# Patient Record
Sex: Female | Born: 1955 | Race: White | Hispanic: No | Marital: Married | Smoking: Former smoker
Health system: Southern US, Community
[De-identification: ages and names within clinical notes are randomized; demographics above are authoritative.]

## PROBLEM LIST (undated history)

## (undated) DIAGNOSIS — E785 Hyperlipidemia, unspecified: Secondary | ICD-10-CM

## (undated) DIAGNOSIS — F419 Anxiety disorder, unspecified: Secondary | ICD-10-CM

## (undated) DIAGNOSIS — Z860100 Personal history of colon polyps, unspecified: Secondary | ICD-10-CM

## (undated) DIAGNOSIS — C9000 Multiple myeloma not having achieved remission: Secondary | ICD-10-CM

## (undated) DIAGNOSIS — E119 Type 2 diabetes mellitus without complications: Secondary | ICD-10-CM

## (undated) DIAGNOSIS — R000 Tachycardia, unspecified: Secondary | ICD-10-CM

## (undated) DIAGNOSIS — K219 Gastro-esophageal reflux disease without esophagitis: Secondary | ICD-10-CM

## (undated) HISTORY — PX: TONSILLECTOMY: SUR1361

## (undated) HISTORY — DX: Tachycardia, unspecified: R00.0

## (undated) HISTORY — DX: Personal history of colonic polyps: Z86.010

## (undated) HISTORY — DX: Type 2 diabetes mellitus without complications: E11.9

## (undated) HISTORY — DX: Anxiety disorder, unspecified: F41.9

## (undated) HISTORY — DX: Personal history of colon polyps, unspecified: Z86.0100

## (undated) HISTORY — PX: DILATION AND CURETTAGE OF UTERUS: SHX78

## (undated) HISTORY — DX: Gastro-esophageal reflux disease without esophagitis: K21.9

## (undated) HISTORY — PX: COLONOSCOPY: SHX174

## (undated) HISTORY — PX: OTHER SURGICAL HISTORY: SHX169

## (undated) HISTORY — DX: Multiple myeloma not having achieved remission: C90.00

## (undated) HISTORY — DX: Hyperlipidemia, unspecified: E78.5

---

## 1981-02-27 HISTORY — PX: AUGMENTATION MAMMAPLASTY: SUR837

## 1981-02-27 HISTORY — PX: BREAST ENHANCEMENT SURGERY: SHX7

## 1991-12-29 HISTORY — PX: ENDOMETRIAL ABLATION: SHX621

## 1995-05-29 HISTORY — PX: WRIST SURGERY: SHX841

## 1996-08-02 DIAGNOSIS — K219 Gastro-esophageal reflux disease without esophagitis: Secondary | ICD-10-CM | POA: Insufficient documentation

## 2003-06-03 DIAGNOSIS — R000 Tachycardia, unspecified: Secondary | ICD-10-CM | POA: Insufficient documentation

## 2004-05-27 ENCOUNTER — Ambulatory Visit: Payer: Self-pay | Admitting: Internal Medicine

## 2005-05-25 ENCOUNTER — Ambulatory Visit: Payer: Self-pay | Admitting: Internal Medicine

## 2006-06-12 ENCOUNTER — Ambulatory Visit: Payer: Self-pay | Admitting: General Surgery

## 2006-06-20 ENCOUNTER — Ambulatory Visit: Payer: Self-pay | Admitting: Internal Medicine

## 2007-06-27 ENCOUNTER — Ambulatory Visit: Payer: Self-pay | Admitting: Internal Medicine

## 2008-07-14 ENCOUNTER — Ambulatory Visit: Payer: Self-pay | Admitting: Internal Medicine

## 2009-10-20 ENCOUNTER — Ambulatory Visit: Payer: Self-pay | Admitting: Internal Medicine

## 2011-03-08 ENCOUNTER — Ambulatory Visit: Payer: Self-pay | Admitting: Internal Medicine

## 2011-07-25 ENCOUNTER — Ambulatory Visit: Payer: Self-pay | Admitting: General Surgery

## 2012-03-11 ENCOUNTER — Ambulatory Visit: Payer: Self-pay | Admitting: Internal Medicine

## 2012-09-12 ENCOUNTER — Encounter: Payer: Self-pay | Admitting: *Deleted

## 2013-05-01 ENCOUNTER — Ambulatory Visit: Payer: Self-pay | Admitting: Internal Medicine

## 2013-06-23 ENCOUNTER — Ambulatory Visit: Payer: Self-pay | Admitting: Internal Medicine

## 2013-11-25 DIAGNOSIS — C4492 Squamous cell carcinoma of skin, unspecified: Secondary | ICD-10-CM

## 2013-11-25 HISTORY — DX: Squamous cell carcinoma of skin, unspecified: C44.92

## 2014-02-27 DIAGNOSIS — E119 Type 2 diabetes mellitus without complications: Secondary | ICD-10-CM

## 2014-02-27 HISTORY — DX: Type 2 diabetes mellitus without complications: E11.9

## 2014-06-24 ENCOUNTER — Other Ambulatory Visit: Payer: Self-pay | Admitting: Internal Medicine

## 2014-06-24 DIAGNOSIS — Z1231 Encounter for screening mammogram for malignant neoplasm of breast: Secondary | ICD-10-CM

## 2014-07-09 ENCOUNTER — Other Ambulatory Visit: Payer: Self-pay | Admitting: Internal Medicine

## 2014-07-09 ENCOUNTER — Ambulatory Visit
Admission: RE | Admit: 2014-07-09 | Discharge: 2014-07-09 | Disposition: A | Discharge status: Home / Self Care | Payer: BC Managed Care – PPO | Source: Ambulatory Visit | Attending: Internal Medicine | Admitting: Internal Medicine

## 2014-07-09 DIAGNOSIS — Z1231 Encounter for screening mammogram for malignant neoplasm of breast: Secondary | ICD-10-CM

## 2014-07-10 ENCOUNTER — Ambulatory Visit: Payer: Self-pay

## 2015-02-24 ENCOUNTER — Ambulatory Visit: Payer: BC Managed Care – PPO | Admitting: Physical Therapy

## 2015-02-25 ENCOUNTER — Encounter: Payer: Self-pay | Admitting: Physical Therapy

## 2015-02-25 ENCOUNTER — Ambulatory Visit: Payer: BC Managed Care – PPO | Attending: Orthopedic Surgery | Admitting: Physical Therapy

## 2015-02-25 DIAGNOSIS — M25561 Pain in right knee: Secondary | ICD-10-CM | POA: Diagnosis present

## 2015-02-25 DIAGNOSIS — M25562 Pain in left knee: Secondary | ICD-10-CM | POA: Insufficient documentation

## 2015-02-25 DIAGNOSIS — M79671 Pain in right foot: Secondary | ICD-10-CM | POA: Insufficient documentation

## 2015-02-25 DIAGNOSIS — R262 Difficulty in walking, not elsewhere classified: Secondary | ICD-10-CM | POA: Diagnosis not present

## 2015-02-25 NOTE — Therapy (Signed)
[facility] [address] [place], [place], [address] Phone: [phone]   Fax:  [phone]  Physical Therapy Treatment  Patient Details  Name: Hannah Mcdonald MRN: [id number] Date of Birth: October 18, 1955 Referring Provider: Dr. Nicola Police  Encounter Date: 02/25/2015      PT End of Session - 02/25/15 1333    Visit Number 1   Number of Visits 8   Date for PT Re-Evaluation 03/25/15   PT Start Time [id number]   PT Stop Time 1125   PT Time Calculation (min) 45 min   Activity Tolerance Patient tolerated treatment well;Patient limited by pain   Behavior During Therapy [facility] for tasks assessed/performed      Past Medical History  Diagnosis Date  . Personal history of colonic polyps     Past Surgical History  Procedure Laterality Date  . Wrist surgery Right 1996  . Breast enhancement surgery  1983  . Cesarean section  2008  . Colonoscopy  2008  . Ganglion cyst removal     . Dilation and curettage of uterus    . Augmentation mammaplasty Bilateral [id number]    silicone    There were no vitals filed for this visit.  Visit Diagnosis:  Difficulty walking  Foot pain, right  Knee pain, bilateral      Subjective Assessment - 02/25/15 1329    Subjective Pt reports no pain at rest but bilateral knee pain that is 5/10. Pt states her pain is worse wtih coming down steps and first thing in the morning. Pt finds relief from heat and Mobic. Pt reports knee pain for several years and that a warm shower helps her knees. Pt reports her R plantar fascitis starting a month ago and aggrevating her knee pain. Pt reports her R foot pain gets better as her day goes on. Pt works as a Government social research officer.   Limitations Standing;Walking;House hold activities;Lifting   How long can you stand comfortably? 5 mins   How long can you walk comfortably? immediate pain   Patient Stated Goals work pain free/ return to walking program/decrease knee stiffness/decrease foot pain    Currently in Pain? No/denies            [facility] PT Assessment - 02/25/15 0001    Assessment   Medical Diagnosis R plantar fascitis; B patellofemoral stress syndrome   Referring Provider Dr. Nicola Police   Onset Date/Surgical Date 12/29/14       OBJECTIVE: There ex: Attempted standing eccentric gastroc control but increased R plantar fascitis pain. Given to pt in sitting to decrease pain on R foot. Pt instructed on proper reps and weights at PF and proper technique for increasing all components of her quad. Pt educated on icing R plantar fascia on frozen water bottle. Ice to B knees and R foot to end session in sitting.        PT Education - 02/25/15 1332    Education provided Yes   Education Details Pt given above handout and demonstrate proper form. Pt educated on importance of wearing tennis shoes to help with plantar fascitis and educated on use of ice.   Person(s) Educated Patient   Methods Explanation;Demonstration;Handout;Verbal cues   Comprehension Returned demonstration;Verbalized understanding             PT Long Term Goals - 02/25/15 1339    PT LONG TERM GOAL #1   Title Pt will report return to gym routine with no increase in B knee pain or R plantar  fascitis pain greater than a 2/10.   Time 4   Period Weeks   Status New   PT LONG TERM GOAL #2   Title Pt will increase LEFS score to greater than 60/80 in order to improve functional mobility.   Baseline 48/80 on 12/29   Time 4   Period Weeks   Status New   PT LONG TERM GOAL #3   Title Pt will transition to walking program in order to maintain community wellness program.   Baseline had to stop a month ago.   Time 4   Period Weeks   Status New   PT LONG TERM GOAL #4   Title Pt will be independent with HEP in order to increase B hamstring strength 1/2 MMT grade in order to increase walking tolerance to 10 mins.   Time 4   Period Weeks   Status New   PT LONG TERM GOAL #5   Title Pt will demonstrate proper B  knee alignment with squatting and report pain no greater than a 2/10.   Time 4   Period Weeks   Status New               Plan - 02/25/15 1334    Clinical Impression Statement Pt is a pleasant 59 y.o. F referred to PT with B patellofemoral syndrome and R plantar fascitis. Pt reports no pain at rest and pain at a 5/10 with standing and walking. Pt ambulates with increased supination and hyperextension of bilateral knees. Pt patellar mobility has tracking to lateral side. Pt patellars are laterally tilted. Pt's MMT: knee flexion 4/5, knee extension 4+/5, hip flexion 4+/5. Pt's ROM is WNL. Pt has a (-) Thessaly's and no pain at joint line with palpation. LEFS: 48/80. Pt will benefit from short term skilled PT to address knee tracking issues, quad weakness and plantar fascitis in order to return to prior level of function.   Pt will benefit from skilled therapeutic intervention in order to improve on the following deficits Abnormal gait;Decreased activity tolerance;Difficulty walking;Decreased endurance;Decreased balance;Decreased mobility;Decreased strength;Postural dysfunction;Improper body mechanics;Pain   Rehab Potential Good   PT Frequency 2x / week   PT Duration 4 weeks   PT Treatment/Interventions ADLs/Self Care Home Management;Cryotherapy;Electrical Stimulation;Iontophoresis 4mg /ml Dexamethasone;Balance training;Moist Heat;Therapeutic exercise;Therapeutic activities;Manual techniques;Energy conservation;Functional mobility training;Stair training;Gait training;Neuromuscular re-education;Patient/family education;Passive range of motion   PT Next Visit Plan Reassess patellar tracking/quad strengthening/eccentric gastroc control in weight bearing   PT Home Exercise Plan see above   Recommended Other Services Continue gym routine    Consulted and Agree with Plan of Care Patient        Problem List There are no active problems to display for this patient.   Lavone Neri,  SPT 02/25/2015, 1:42 PM  [facility] [address] [place], [place], [address] Phone: [phone]   Fax:  [phone]  Name: Hannah Mcdonald MRN: [id number] Date of Birth: 11/02/1955

## 2015-03-02 ENCOUNTER — Ambulatory Visit: Payer: BC Managed Care – PPO | Attending: Orthopedic Surgery | Admitting: Physical Therapy

## 2015-03-02 DIAGNOSIS — M25561 Pain in right knee: Secondary | ICD-10-CM | POA: Diagnosis present

## 2015-03-02 DIAGNOSIS — R262 Difficulty in walking, not elsewhere classified: Secondary | ICD-10-CM | POA: Diagnosis not present

## 2015-03-02 DIAGNOSIS — M79671 Pain in right foot: Secondary | ICD-10-CM | POA: Insufficient documentation

## 2015-03-02 DIAGNOSIS — M25562 Pain in left knee: Secondary | ICD-10-CM | POA: Insufficient documentation

## 2015-03-03 ENCOUNTER — Encounter: Payer: Self-pay | Admitting: Physical Therapy

## 2015-03-03 NOTE — Therapy (Signed)
[facility] [address] [place], [place], [address] Phone: [phone]   Fax:  [phone]  Physical Therapy Treatment  Patient Details  Name: Evalee Huckeba MRN: [id number] Date of Birth: 1955-10-26 Referring Provider: Dr. Nicola Police  Encounter Date: 03/02/2015      PT End of Session - 03/03/15 0840    Visit Number 2   Number of Visits 8   Date for PT Re-Evaluation 03/25/15   PT Start Time [id number]   PT Stop Time 1655   PT Time Calculation (min) 38 min   Activity Tolerance Patient tolerated treatment well;Patient limited by pain   Behavior During Therapy [facility] for tasks assessed/performed      Past Medical History  Diagnosis Date  . Personal history of colonic polyps     Past Surgical History  Procedure Laterality Date  . Wrist surgery Right 1996  . Breast enhancement surgery  1983  . Cesarean section  2008  . Colonoscopy  2008  . Ganglion cyst removal     . Dilation and curettage of uterus    . Augmentation mammaplasty Bilateral [id number]    silicone    There were no vitals filed for this visit.  Visit Diagnosis:  Difficulty walking  Foot pain, right  Knee pain, bilateral      Subjective Assessment - 03/03/15 0839    Subjective Pt reports no pain at rest but that she woke with R foot pain. Pt reports no knee pain due to not having steps at work today.   Limitations Standing;Walking;House hold activities;Lifting   How long can you stand comfortably? 5 mins   How long can you walk comfortably? immediate pain   Patient Stated Goals work pain free/ return to walking program/decrease knee stiffness/decrease foot pain   Currently in Pain? No/denies     OBJECTIVE: There ex: Short arc quad with manual resistance with normal/toes out/toes in x 10. Long arc quads x 30 sitting upright. Eccentric step downs on 3" step x 15 each leg. Reports of increased knee pain. Increased patellar misalignment and medial tracking noted.  Manual:  Patellar mobilizations grade III all 4 planes. Fibular head mobilizations grade III 3 x 30 seconds.   Pt response to tx for medical necessity: Pt benefits from strengthening and proper quad control to improve patellar mobility and functional mobility. Pt benefits from manual therapy to assist with realignment of patellar and proper tracking.      PT Long Term Goals - 02/25/15 1339    PT LONG TERM GOAL #1   Title Pt will report return to gym routine with no increase in B knee pain or R plantar fascitis pain greater than a 2/10.   Time 4   Period Weeks   Status New   PT LONG TERM GOAL #2   Title Pt will increase LEFS score to greater than 60/80 in order to improve functional mobility.   Baseline 48/80 on 12/29   Time 4   Period Weeks   Status New   PT LONG TERM GOAL #3   Title Pt will transition to walking program in order to maintain community wellness program.   Baseline had to stop a month ago.   Time 4   Period Weeks   Status New   PT LONG TERM GOAL #4   Title Pt will be independent with HEP in order to increase B hamstring strength 1/2 MMT grade in order to increase walking tolerance to 10 mins.   Time  4   Period Weeks   Status New   PT LONG TERM GOAL #5   Title Pt will demonstrate proper B knee alignment with squatting and report pain no greater than a 2/10.   Time 4   Period Weeks   Status New           Plan - 03/03/15 0840    Clinical Impression Statement Pt demonstrates good eccentric quad control in open chain exercises. Pt is unable to perform closed chain quad strengthening secondary for pain. Pt demonstrates tenderness to in R arch. Pt's patellar mobility is limited on the R as compared to the L. Pt's patellas are medial rotated.   Pt will benefit from skilled therapeutic intervention in order to improve on the following deficits Abnormal gait;Decreased activity tolerance;Difficulty walking;Decreased endurance;Decreased balance;Decreased mobility;Decreased  strength;Postural dysfunction;Improper body mechanics;Pain   Rehab Potential Good   PT Frequency 2x / week   PT Duration 4 weeks   PT Treatment/Interventions ADLs/Self Care Home Management;Cryotherapy;Electrical Stimulation;Iontophoresis 4mg /ml Dexamethasone;Balance training;Moist Heat;Therapeutic exercise;Therapeutic activities;Manual techniques;Energy conservation;Functional mobility training;Stair training;Gait training;Neuromuscular re-education;Patient/family education;Passive range of motion   PT Next Visit Plan mini squats/mini lunges/taping/strengthening in closed chain   PT Home Exercise Plan see above   Consulted and Agree with Plan of Care Patient        Problem List There are no active problems to display for this patient.   Lavone Neri, SPT 03/03/2015, 8:43 AM  [facility] [address] [place], [place], [address] Phone: [phone]   Fax:  [phone]  Name: Yamilez Stanton MRN: [id number] Date of Birth: 08-Oct-1955

## 2015-03-04 ENCOUNTER — Ambulatory Visit: Payer: BC Managed Care – PPO | Admitting: Physical Therapy

## 2015-03-04 ENCOUNTER — Encounter: Payer: Self-pay | Admitting: Physical Therapy

## 2015-03-04 DIAGNOSIS — M25561 Pain in right knee: Secondary | ICD-10-CM

## 2015-03-04 DIAGNOSIS — M25562 Pain in left knee: Secondary | ICD-10-CM

## 2015-03-04 DIAGNOSIS — R262 Difficulty in walking, not elsewhere classified: Secondary | ICD-10-CM

## 2015-03-04 DIAGNOSIS — M79671 Pain in right foot: Secondary | ICD-10-CM

## 2015-03-04 NOTE — Therapy (Signed)
[facility] [address] [place], [place], [address] Phone: [phone]   Fax:  [phone]  Physical Therapy Treatment  Patient Details  Name: Hannah Mcdonald MRN: [id number] Date of Birth: 06-20-1955 Referring Provider: Dr. Nicola Police  Encounter Date: 03/04/2015      PT End of Session - 03/04/15 1732    Visit Number 3   Number of Visits 8   Date for PT Re-Evaluation 03/25/15   PT Start Time [id number]   PT Stop Time 1650   PT Time Calculation (min) 47 min   Activity Tolerance Patient tolerated treatment well;No increased pain   Behavior During Therapy [facility] for tasks assessed/performed      Past Medical History  Diagnosis Date  . Personal history of colonic polyps     Past Surgical History  Procedure Laterality Date  . Wrist surgery Right 1996  . Breast enhancement surgery  1983  . Cesarean section  2008  . Colonoscopy  2008  . Ganglion cyst removal     . Dilation and curettage of uterus    . Augmentation mammaplasty Bilateral [id number]    silicone    There were no vitals filed for this visit.  Visit Diagnosis:  Difficulty walking  Foot pain, right  Knee pain, bilateral      Subjective Assessment - 03/04/15 1732    Subjective Pt reports heel pain on R is doing better. Pt states her knee pain was not noticable Wednesday but is bothering her a little today.    Limitations Standing;Walking;House hold activities;Lifting   How long can you stand comfortably? 5 mins   How long can you walk comfortably? immediate pain   Patient Stated Goals work pain free/ return to walking program/decrease knee stiffness/decrease foot pain   Currently in Pain? Yes   Pain Score 2    Pain Location Knee   Pain Orientation Left;Right      OBJECTIVE: There ex: Attempted Leuko tape with no adhesive luck. Wall squats with white therapy ball x 30 with toes in/toes out. Pain reported with normal alignment squatting. Mini squats in the parallel bars x  20 with reports of R heel pain. Airex single leg stance for 30 seconds with UE support. Step downs off Airex x 20 with no complaints of pain. Manual: STM to R plantar fascitis (tenderness and palpable fascia tightness noted). Plantar fascitis stretching.  Pt response to tx for medical necessity: Pt benefits from eccentric quad control to strengthen and enhance patellar tracking. Good tracking noted with all motions and crepitus noted throughout.        PT Long Term Goals - 02/25/15 1339    PT LONG TERM GOAL #1   Title Pt will report return to gym routine with no increase in B knee pain or R plantar fascitis pain greater than a 2/10.   Time 4   Period Weeks   Status New   PT LONG TERM GOAL #2   Title Pt will increase LEFS score to greater than 60/80 in order to improve functional mobility.   Baseline 48/80 on 12/29   Time 4   Period Weeks   Status New   PT LONG TERM GOAL #3   Title Pt will transition to walking program in order to maintain community wellness program.   Baseline had to stop a month ago.   Time 4   Period Weeks   Status New   PT LONG TERM GOAL #4   Title Pt will be  independent with HEP in order to increase B hamstring strength 1/2 MMT grade in order to increase walking tolerance to 10 mins.   Time 4   Period Weeks   Status New   PT LONG TERM GOAL #5   Title Pt will demonstrate proper B knee alignment with squatting and report pain no greater than a 2/10.   Time 4   Period Weeks   Status New               Plan - 03/04/15 1733    Clinical Impression Statement Attempted to use Lueko tape to help with patellar tracking with no relief. Pt demonstrates proper tracking with wall squats with white therapy ball. Pt finds relief from plantar stretching and soft tissue work. Pt continues to be motivated to make progress towards her goals.    Pt will benefit from skilled therapeutic intervention in order to improve on the following deficits Abnormal gait;Decreased  activity tolerance;Difficulty walking;Decreased endurance;Decreased balance;Decreased mobility;Decreased strength;Postural dysfunction;Improper body mechanics;Pain   Rehab Potential Good   PT Frequency 2x / week   PT Duration 4 weeks   PT Treatment/Interventions ADLs/Self Care Home Management;Cryotherapy;Electrical Stimulation;Iontophoresis 4mg /ml Dexamethasone;Balance training;Moist Heat;Therapeutic exercise;Therapeutic activities;Manual techniques;Energy conservation;Functional mobility training;Stair training;Gait training;Neuromuscular re-education;Patient/family education;Passive range of motion   PT Next Visit Plan strengthening/patellar tracking/eccentric quad control   PT Home Exercise Plan see above   Consulted and Agree with Plan of Care Patient        Problem List There are no active problems to display for this patient.   Lavone Neri, SPT [place], [place] 03/04/2015, 5:34 PM  [facility] [address] [place], [place], [address] Phone: [phone]   Fax:  [phone]  Name: Clyda Ludy MRN: [id number] Date of Birth: [birth year]

## 2015-03-09 ENCOUNTER — Ambulatory Visit: Payer: BC Managed Care – PPO | Admitting: Physical Therapy

## 2015-03-09 DIAGNOSIS — M25562 Pain in left knee: Secondary | ICD-10-CM

## 2015-03-09 DIAGNOSIS — M79671 Pain in right foot: Secondary | ICD-10-CM

## 2015-03-09 DIAGNOSIS — M25561 Pain in right knee: Secondary | ICD-10-CM

## 2015-03-09 DIAGNOSIS — R262 Difficulty in walking, not elsewhere classified: Secondary | ICD-10-CM

## 2015-03-10 ENCOUNTER — Encounter: Payer: Self-pay | Admitting: Physical Therapy

## 2015-03-10 NOTE — Therapy (Signed)
[facility] [address] [place], [place], [address] Phone: [phone]   Fax:  [phone]  Physical Therapy Treatment  Patient Details  Name: Hannah Mcdonald MRN: [id number] Date of Birth: [birth year] Referring Provider: Dr. Nicola Police  Encounter Date: 03/09/2015      PT End of Session - 03/10/15 1415    Visit Number 4   Number of Visits 8   Date for PT Re-Evaluation 03/25/15   PT Start Time [id number]   PT Stop Time 1635   PT Time Calculation (min) 50 min   Activity Tolerance Patient tolerated treatment well;Patient limited by pain   Behavior During Therapy [facility] for tasks assessed/performed      Past Medical History  Diagnosis Date  . Personal history of colonic polyps     Past Surgical History  Procedure Laterality Date  . Wrist surgery Right 1996  . Breast enhancement surgery  1983  . Cesarean section  2008  . Colonoscopy  2008  . Ganglion cyst removal     . Dilation and curettage of uterus    . Augmentation mammaplasty Bilateral [id number]    silicone    There were no vitals filed for this visit.  Visit Diagnosis:  Difficulty walking  Foot pain, right  Knee pain, bilateral      Subjective Assessment - 03/10/15 1414    Subjective Pt reports R heel and knee pain. Pt states that not having school the past 2 days has helped her knees.    Limitations Standing;Walking;House hold activities;Lifting   How long can you stand comfortably? 5 mins   How long can you walk comfortably? immediate pain   Patient Stated Goals work pain free/ return to walking program/decrease knee stiffness/decrease foot pain   Currently in Pain? Yes   Pain Score 3    Pain Location Heel   Pain Orientation Right     OBJECTIVE: Manual: STM with plantar fascitis stretch on R medial arch. Tenderness noted to R lateral arch with palpation. Increased fascia thickness noted with R medial plantar fascia STM. Pt reports relief with STM and passive  stretching. Grade III patellar mobilizations all 4 directions x3 for 30 seconds each. There ex: Lunges x 15 with R LE forwards, L LE forward increased R heel pain. Squats x 30 with no reports of increased knee pain. Step backs x 30 bilaterally with no reports of pain.   Pt response to tx for medical necessity: Pt benefits from strengthening to decrease knee pain and poor tracking. Pt benefits from manual therapy to increase R plantar fascia mobility.       PT Long Term Goals - 02/25/15 1339    PT LONG TERM GOAL #1   Title Pt will report return to gym routine with no increase in B knee pain or R plantar fascitis pain greater than a 2/10.   Time 4   Period Weeks   Status New   PT LONG TERM GOAL #2   Title Pt will increase LEFS score to greater than 60/80 in order to improve functional mobility.   Baseline 48/80 on 12/29   Time 4   Period Weeks   Status New   PT LONG TERM GOAL #3   Title Pt will transition to walking program in order to maintain community wellness program.   Baseline had to stop a month ago.   Time 4   Period Weeks   Status New   PT LONG TERM GOAL #4   Title  Pt will be independent with HEP in order to increase B hamstring strength 1/2 MMT grade in order to increase walking tolerance to 10 mins.   Time 4   Period Weeks   Status New   PT LONG TERM GOAL #5   Title Pt will demonstrate proper B knee alignment with squatting and report pain no greater than a 2/10.   Time 4   Period Weeks   Status New               Plan - 03/10/15 1415    Clinical Impression Statement Pt reports tenderness with lateral R arch palpation. Pt has increased fascia thickening on the medial side of her R medial arch. Pt demonstrates proper tracking with squatting and eccentric step downs. Pt has difficulty with lunges on the R side secondary to R heel pain.    Pt will benefit from skilled therapeutic intervention in order to improve on the following deficits Abnormal gait;Decreased  activity tolerance;Difficulty walking;Decreased endurance;Decreased balance;Decreased mobility;Decreased strength;Postural dysfunction;Improper body mechanics;Pain   Rehab Potential Good   PT Frequency 2x / week   PT Duration 4 weeks   PT Treatment/Interventions ADLs/Self Care Home Management;Cryotherapy;Electrical Stimulation;Iontophoresis 4mg /ml Dexamethasone;Balance training;Moist Heat;Therapeutic exercise;Therapeutic activities;Manual techniques;Energy conservation;Functional mobility training;Stair training;Gait training;Neuromuscular re-education;Patient/family education;Passive range of motion   PT Next Visit Plan strengthening/patellar tracking/eccentric quad control   PT Home Exercise Plan continue current plan   Consulted and Agree with Plan of Care Patient        Problem List There are no active problems to display for this patient.   Lavone Neri, SPT 03/10/2015, 2:17 PM  [place] [address] [place], [place], [address] Phone: [phone]   Fax:  [phone]  Name: Neda Stutheit MRN: [id number] Date of Birth: 03-30-1955

## 2015-03-11 ENCOUNTER — Encounter: Payer: BC Managed Care – PPO | Admitting: Physical Therapy

## 2015-03-15 ENCOUNTER — Ambulatory Visit: Payer: BC Managed Care – PPO | Admitting: Physical Therapy

## 2015-03-17 ENCOUNTER — Encounter: Payer: BC Managed Care – PPO | Admitting: Physical Therapy

## 2015-03-23 ENCOUNTER — Ambulatory Visit: Payer: BC Managed Care – PPO | Admitting: Physical Therapy

## 2015-03-23 ENCOUNTER — Encounter: Payer: Self-pay | Admitting: Physical Therapy

## 2015-03-23 DIAGNOSIS — R262 Difficulty in walking, not elsewhere classified: Secondary | ICD-10-CM | POA: Diagnosis not present

## 2015-03-23 DIAGNOSIS — M79671 Pain in right foot: Secondary | ICD-10-CM

## 2015-03-23 DIAGNOSIS — M25562 Pain in left knee: Secondary | ICD-10-CM

## 2015-03-23 DIAGNOSIS — M25561 Pain in right knee: Secondary | ICD-10-CM

## 2015-03-24 NOTE — Therapy (Signed)
[facility] [address] [place], [place], [address] Phone: [phone]   Fax:  [phone]  Physical Therapy Treatment  Patient Details  Name: Hannah Mcdonald MRN: [id number] Date of Birth: 14-Sep-1955 Referring Provider: Dr. Nicola Police  Encounter Date: 03/23/2015      PT End of Session - 03/24/15 0757    Visit Number 5   Number of Visits 8   Date for PT Re-Evaluation 03/25/15   PT Start Time 0401   PT Stop Time 0449   PT Time Calculation (min) 48 min   Activity Tolerance Patient tolerated treatment well;No increased pain   Behavior During Therapy [facility] for tasks assessed/performed      Past Medical History  Diagnosis Date  . Personal history of colonic polyps     Past Surgical History  Procedure Laterality Date  . Wrist surgery Right 1996  . Breast enhancement surgery  1983  . Cesarean section  2008  . Colonoscopy  2008  . Ganglion cyst removal     . Dilation and curettage of uterus    . Augmentation mammaplasty Bilateral [id number]    silicone    There were no vitals filed for this visit.  Visit Diagnosis:  No diagnosis found.      Subjective Assessment - 03/23/15 1606    Subjective Pt was out of town for 10 days for family emergency. Pt reports feeling stiff in B knee but no complaints of pain. Pt states she does not have any pain in R foot, only slight discomfort.   Limitations Writing;Standing;Walking;House hold activities   Patient Stated Goals work pain free/ return to walking program/decrease knee stiffness/decrease foot pain   Currently in Pain? No/denies       Objective: SCIFIT: 10 mins (no charge/warm-up). Manual: Patellar mobilizations in all four planes x 3 bouts for 30 seconds with no reports of pain. Proximal hamstring stretch with ankle pump at end range x 3 for 30 seconds. Quad stretch in prone x 2 for 30 seconds. Ther ex: Squats and single leg squats x 20 on total gym with good patellar tracking and no  reports of knee or foot pain. Stairs x 10 with good form and no reports of pain.  Pt response to tx for medical necessity: Pt benefits from strengthening to decrease knee pain and ascend/descend stairs safely. Pt benefits from manual therapy to increase patellar mobility.       PT Long Term Goals - 03/23/15 1714    PT LONG TERM GOAL #1   Title Pt will report return to gym routine with no increase in B knee pain or R plantar fascitis pain greater than a 2/10.   Time 4   Period Weeks   Status Partially Met   PT LONG TERM GOAL #2   Title Pt will increase LEFS score to greater than 60/80 in order to improve functional mobility.   Baseline 63/80 on 1/24   Time 4   Period Weeks   Status Achieved   PT LONG TERM GOAL #3   Title Pt will transition to walking program in order to maintain community wellness program.   Time 4   Period Weeks   Status On-going   PT LONG TERM GOAL #4   Title Pt will be independent with HEP in order to increase B hamstring strength 1/2 MMT grade in order to increase walking tolerance to 10 mins.   Period Weeks   Status Achieved   PT LONG TERM GOAL #5  Title Pt will demonstrate proper B knee alignment with squatting and report pain no greater than a 2/10.   Time 4   Period Weeks   Status Achieved            Plan - 03/23/15 1715    Clinical Impression Statement Pt reports no pain with stairs, occasionally feels stiffness in the AM which goes away after shower. Pt demonstrated good form and patellar tracking with squats and single leg squats on total gym. Pt demonstrated optimal HS length with no pain at end range.   Pt will benefit from skilled therapeutic intervention in order to improve on the following deficits Abnormal gait;Decreased activity tolerance;Decreased balance;Decreased endurance;Difficulty walking;Decreased mobility;Decreased strength;Postural dysfunction;Improper body mechanics;Pain   Rehab Potential Good   PT Frequency 2x / week   PT  Duration 4 weeks   PT Treatment/Interventions ADLs/Self Care Home Management;Cryotherapy;Electrical Stimulation;Iontophoresis 25m/ml Dexamethasone;Balance training;Moist Heat;Therapeutic exercise;Therapeutic activities;Manual techniques;Energy conservation;Functional mobility training;Stair training;Gait training;Neuromuscular re-education;Patient/family education;Passive range of motion   PT Next Visit Plan Reassess all goals.  Probable discharge with focus on gym based/ HEP.  Issue new quad strengthening next tx.    PT Home Exercise Plan continue current plan   Recommended Other [place] Agree with Plan of Care Patient        Problem List There are no active problems to display for this patient.   SGeorgina Pillion SPT 03/24/2015, 8:18 AM  [facility] [facility] [address]Phone: [phone]  Fax:  [phone] Name: CDenetta FeiMRN: [id number] of Birth: 3Apr 05, 1957

## 2015-03-25 ENCOUNTER — Ambulatory Visit: Payer: BC Managed Care – PPO | Admitting: Physical Therapy

## 2015-03-25 DIAGNOSIS — M25562 Pain in left knee: Secondary | ICD-10-CM

## 2015-03-25 DIAGNOSIS — M25561 Pain in right knee: Secondary | ICD-10-CM

## 2015-03-25 DIAGNOSIS — R262 Difficulty in walking, not elsewhere classified: Secondary | ICD-10-CM | POA: Diagnosis not present

## 2015-03-25 DIAGNOSIS — M79671 Pain in right foot: Secondary | ICD-10-CM

## 2015-03-26 NOTE — Therapy (Addendum)
[facility] [address] [place], [place], [address] Phone: [phone]   Fax:  [phone]  Physical Therapy Treatment  Patient Details  Name: Hannah Mcdonald MRN: [id number] Date of Birth: 06-Aug-1955 Referring Provider: Dr. Nicola Police  Encounter Date: 03/25/2015      PT End of Session - 03/29/15 0829    Visit Number 6   Number of Visits 8   Date for PT Re-Evaluation 03/25/15   PT Start Time [id number]   PT Stop Time [id number]   PT Time Calculation (min) 51 min   Activity Tolerance Patient tolerated treatment well;No increased pain   Behavior During Therapy [facility] for tasks assessed/performed      Past Medical History  Diagnosis Date  . Personal history of colonic polyps     Past Surgical History  Procedure Laterality Date  . Wrist surgery Right 1996  . Breast enhancement surgery  1983  . Cesarean section  2008  . Colonoscopy  2008  . Ganglion cyst removal     . Dilation and curettage of uterus    . Augmentation mammaplasty Bilateral [id number]    silicone    There were no vitals filed for this visit.  Visit Diagnosis:  Difficulty walking  Foot pain, right  Knee pain, bilateral      Subjective Assessment - 03/29/15 0825    Subjective Pt. states she has done well with B knee stiffness/ discomfort over past week.  Pt. reports independence with gym based/ HEP at this time.    Limitations Writing;Standing;Walking;House hold activities   Patient Stated Goals work pain free/ return to walking program/decrease knee stiffness/decrease foot pain   Currently in Pain? No/denies        Pt. states she has done well with B knee stiffness/ discomfort over past week.  Pt. reports independence with gym based/ HEP at this time.      Objective: Discussed gym based/ circuit training program.  Manual: Patellar mobilizations in all four planes x 3 bouts for 30 seconds with no reports of pain. Proximal hamstring stretch with ankle pump at end range  x 3 for 30 seconds. Quad stretch in prone x 2 for 30 seconds.  Ther ex: Squats and single leg squats x 20 on total gym with good patellar tracking and no reports of knee or foot pain. Stairs x 10 with good form and no reports of pain.  Supine R/L knee and hip manual isometrics (mod. Resistance) 2x each (good patellar tracking).  See new HEP.   Pt response to tx for medical necessity: Pt benefits from strengthening to decrease knee pain and ascend/descend stairs safely. Pt benefits from manual therapy to increase patellar mobility. Discharge from PT at this time.       Pt. reports no episodes of B knee pain t/o tx. session/ stair clmibing.  Good patellar tracking with min. medial tilt noted during TG bilateral knee flexion.  Pt. demonstrates good technique/ independence with HEP.  Pt. reviewed gym based/ circuit training program with PT and emphasized importance of no pain during ther.ex.  Discharge from PT at this time with focus on ex. program.  Pt. instructed to contact PT if any issues noted over next several weeks.        PT Education - 03/29/15 [id number]    Education provided Yes   Education Details See eccentric quad strengthening progressive ex.    Person(s) Educated Patient   Methods Explanation;Demonstration;Handout   Comprehension Returned demonstration;Verbalized understanding  PT Long Term Goals - 03/29/15 0836    PT LONG TERM GOAL #1   Title Pt will report return to gym routine with no increase in B knee pain or R plantar fascitis pain greater than a 2/10.   Time 4   Period Weeks   Status Achieved   PT LONG TERM GOAL #2   Title Pt will increase LEFS score to greater than 60/80 in order to improve functional mobility.   Baseline 63/80 on 1/24   Time 4   Period Weeks   Status Achieved   PT LONG TERM GOAL #3   Title Pt will transition to walking program in order to maintain community wellness program.   Time 4   Period Weeks   Status Achieved   PT  LONG TERM GOAL #4   Title Pt will be independent with HEP in order to increase B hamstring strength 1/2 MMT grade in order to increase walking tolerance to 10 mins.   Time 4   Period Weeks   Status Achieved   PT LONG TERM GOAL #5   Title Pt will demonstrate proper B knee alignment with squatting and report pain no greater than a 2/10.   Time 4   Period Weeks   Status Achieved      Problem List There are no active problems to display for this patient.  Pura Spice, PT, DPT # [id number]   03/29/2015, 8:37 AM  [facility] [address] [place], [place], [address] Phone: [phone]   Fax:  [phone]  Name: Hannah Mcdonald MRN: [id number] Date of Birth: [birth year]

## 2015-03-29 ENCOUNTER — Encounter: Payer: Self-pay | Admitting: Physical Therapy

## 2015-10-14 ENCOUNTER — Other Ambulatory Visit: Payer: Self-pay | Admitting: Internal Medicine

## 2015-10-14 DIAGNOSIS — Z1231 Encounter for screening mammogram for malignant neoplasm of breast: Secondary | ICD-10-CM

## 2015-10-27 ENCOUNTER — Ambulatory Visit: Admission: RE | Admit: 2015-10-27 | Payer: BC Managed Care – PPO | Source: Ambulatory Visit

## 2015-11-09 ENCOUNTER — Other Ambulatory Visit: Payer: Self-pay | Admitting: Internal Medicine

## 2015-11-09 ENCOUNTER — Ambulatory Visit
Admission: RE | Admit: 2015-11-09 | Discharge: 2015-11-09 | Disposition: A | Discharge status: Home / Self Care | Payer: BC Managed Care – PPO | Source: Ambulatory Visit | Attending: Internal Medicine | Admitting: Internal Medicine

## 2015-11-09 DIAGNOSIS — Z1231 Encounter for screening mammogram for malignant neoplasm of breast: Secondary | ICD-10-CM

## 2016-06-28 ENCOUNTER — Encounter: Payer: Self-pay | Admitting: *Deleted

## 2016-07-04 ENCOUNTER — Encounter: Payer: Self-pay | Admitting: General Surgery

## 2016-07-05 ENCOUNTER — Ambulatory Visit (INDEPENDENT_AMBULATORY_CARE_PROVIDER_SITE_OTHER): Payer: BC Managed Care – PPO | Admitting: General Surgery

## 2016-07-05 ENCOUNTER — Encounter: Payer: Self-pay | Admitting: General Surgery

## 2016-07-05 ENCOUNTER — Ambulatory Visit: Payer: Self-pay | Admitting: General Surgery

## 2016-07-05 VITALS — BP 142/80 | HR 94 | Resp 12 | Ht 62.0 in | Wt 150.0 lb

## 2016-07-05 DIAGNOSIS — Z800 Family history of malignant neoplasm of digestive organs: Secondary | ICD-10-CM | POA: Diagnosis not present

## 2016-07-05 MED ORDER — POLYETHYLENE GLYCOL 3350 17 GM/SCOOP PO POWD: ORAL | 0 refills | Status: DC

## 2016-07-05 NOTE — Patient Instructions (Signed)
Colonoscopy, Adult A colonoscopy is an exam to look at the entire large intestine. During the exam, a lubricated, bendable tube is inserted into the anus and then passed into the rectum, colon, and other parts of the large intestine. A colonoscopy is often done as a part of normal colorectal screening or in response to certain symptoms, such as anemia, persistent diarrhea, abdominal pain, and blood in the stool. The exam can help screen for and diagnose medical problems, including:  Tumors.  Polyps.  Inflammation.  Areas of bleeding. Tell a health care provider about:  Any allergies you have.  All medicines you are taking, including vitamins, herbs, eye drops, creams, and over-the-counter medicines.  Any problems you or family members have had with anesthetic medicines.  Any blood disorders you have.  Any surgeries you have had.  Any medical conditions you have.  Any problems you have had passing stool. What are the risks? Generally, this is a safe procedure. However, problems may occur, including:  Bleeding.  A tear in the intestine.  A reaction to medicines given during the exam.  Infection (rare). What happens before the procedure? Eating and drinking restrictions  Follow instructions from your health care provider about eating and drinking, which may include:  A few days before the procedure - follow a low-fiber diet. Avoid nuts, seeds, dried fruit, raw fruits, and vegetables.  1-3 days before the procedure - follow a clear liquid diet. Drink only clear liquids, such as clear broth or bouillon, black coffee or tea, clear juice, clear soft drinks or sports drinks, gelatin dessert, and popsicles. Avoid any liquids that contain red or purple dye.  On the day of the procedure - do not eat or drink anything during the 2 hours before the procedure, or within the time period that your health care provider recommends. Bowel prep  If you were prescribed an oral bowel prep to  clean out your colon:  Take it as told by your health care provider. Starting the day before your procedure, you will need to drink a large amount of medicated liquid. The liquid will cause you to have multiple loose stools until your stool is almost clear or light green.  If your skin or anus gets irritated from diarrhea, you may use these to relieve the irritation:  Medicated wipes, such as adult wet wipes with aloe and vitamin E.  A skin soothing-product like petroleum jelly.  If you vomit while drinking the bowel prep, take a break for up to 60 minutes and then begin the bowel prep again. If vomiting continues and you cannot take the bowel prep without vomiting, call your health care provider. General instructions   Ask your health care provider about changing or stopping your regular medicines. This is especially important if you are taking diabetes medicines or blood thinners.  Plan to have someone take you home from the hospital or clinic. What happens during the procedure?  An IV tube may be inserted into one of your veins.  You will be given medicine to help you relax (sedative).  To reduce your risk of infection:  Your health care team will wash or sanitize their hands.  Your anal area will be washed with soap.  You will be asked to lie on your side with your knees bent.  Your health care provider will lubricate a long, thin, flexible tube. The tube will have a camera and a light on the end.  The tube will be inserted into your anus.    The tube will be gently eased through your rectum and colon.  Air will be delivered into your colon to keep it open. You may feel some pressure or cramping.  The camera will be used to take images during the procedure.  A small tissue sample may be removed from your body to be examined under a microscope (biopsy). If any potential problems are found, the tissue will be sent to a lab for testing.  If small polyps are found, your health  care provider may remove them and have them checked for cancer cells.  The tube that was inserted into your anus will be slowly removed. The procedure may vary among health care providers and hospitals. What happens after the procedure?  Your blood pressure, heart rate, breathing rate, and blood oxygen level will be monitored until the medicines you were given have worn off.  Do not drive for 24 hours after the exam.  You may have a small amount of blood in your stool.  You may pass gas and have mild abdominal cramping or bloating due to the air that was used to inflate your colon during the exam.  It is up to you to get the results of your procedure. Ask your health care provider, or the department performing the procedure, when your results will be ready. This information is not intended to replace advice given to you by your health care provider. Make sure you discuss any questions you have with your health care provider. Document Released: 02/11/2000 Document Revised: 12/15/2015 Document Reviewed: 04/27/2015 Elsevier Interactive Patient Education  2017 Elsevier Inc.  

## 2016-07-05 NOTE — Progress Notes (Signed)
Patient ID: Hannah Mcdonald, female   DOB: 04/11/1955, 61 y.o.   MRN: [id number]  Chief Complaint  Patient presents with  . Colonoscopy    HPI Hannah Mcdonald is a 61 y.o. female. Who presents for a colonoscopy discussion. The last colonoscopy was completed on 07-25-11  . Denies any gastrointestinal issues. Bowels move regular and no bleeding noted. 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 Her father had colon cancer at age 7.   HPI  Past Medical History:  Diagnosis Date  . Anxiety   . Diabetes mellitus without complication ([place]) 3888  . GERD (gastroesophageal reflux disease)   . Hyperlipidemia   . Personal history of colonic polyps   . Tachycardia     Past Surgical History:  Procedure Laterality Date  . AUGMENTATION MAMMAPLASTY Bilateral 2800   silicone  . BREAST ENHANCEMENT SURGERY  1983  . [place]  . COLONOSCOPY  2008, 2013  . DILATION AND CURETTAGE OF UTERUS    . ENDOMETRIAL ABLATION  12/1991  . ganglion cyst removal     . WRIST SURGERY Right 05/1995    Family History  Problem Relation Age of Onset  . Colon polyps Sister   . Colon cancer Father 28  . Diabetes Mother   . Breast cancer Neg Hx     Social History Social History  Substance Use Topics  . Smoking status: Former Smoker    Packs/day: 1.00    Years: 4.00  . Smokeless tobacco: Never Used  . Alcohol use Yes    No Known Allergies  Current Outpatient Prescriptions  Medication Sig Dispense Refill  . atorvastatin (LIPITOR) 40 MG tablet Take 40 mg by mouth daily.    . busPIRone (BUSPAR) 30 MG tablet Take 30 mg by mouth 2 (two) times daily.    . Calcium Carbonate (CALCIUM 600 PO) Take 1 tablet by mouth 2 (two) times daily.    . cyclobenzaprine (FLEXERIL) 10 MG tablet Take 10 mg by mouth at bedtime.    . liraglutide (VICTOZA) 18 MG/3ML SOPN Inject 1.2 mg into the skin daily.    . Magnesium 400 MG TABS Take 1 tablet by mouth daily.    . meloxicam (MOBIC) 15 MG tablet Take  15 mg by mouth daily.    . metFORMIN (GLUCOPHAGE) 500 MG tablet Take 500 mg by mouth 2 (two) times daily with a meal.    . metoprolol succinate (TOPROL-XL) 50 MG 24 hr tablet Take 50 mg by mouth daily. Take with or immediately following a meal.    . omeprazole (PRILOSEC) 20 MG capsule Take 20 mg by mouth daily.    . polyethylene glycol powder (GLYCOLAX/MIRALAX) powder 255 grams one bottle for colonoscopy prep 255 g 0   No current facility-administered medications for this visit.     Review of Systems Review of Systems  Constitutional: Negative.   Respiratory: Negative.   Cardiovascular: Negative.     Blood pressure (!) 142/80, pulse 94, resp. rate 12, height 5\' 2"  (1.575 m), weight 150 lb (68 kg).  Physical Exam Physical Exam  Constitutional: She is oriented to person, place, and time. She appears well-developed and well-nourished.  HENT:  Mouth/Throat: Oropharynx is clear and moist.  Eyes: Conjunctivae are normal. No scleral icterus.  Neck: Neck supple.  Cardiovascular: Normal rate, regular rhythm and normal heart sounds.   Pulmonary/Chest: Effort normal and breath sounds normal.  Abdominal: Soft.  Lymphadenopathy:    She has no cervical adenopathy.  Neurological: She is alert and oriented to person, place, and time.  Skin: Skin is warm and dry.  Psychiatric: Her behavior is normal.    Data Reviewed 07/25/2011 colonoscopy was normal.  Assessment    Added for follow-up exam based on family history.    Plan       Colonoscopy with possible biopsy/polypectomy prn: Information regarding the procedure, including its potential risks and complications (including but not limited to perforation of the bowel, which may require emergency surgery to repair, and bleeding) was verbally given to the patient. Educational information regarding lower intestinal endoscopy was given to the patient. Written instructions for how to complete the bowel prep using Miralax were provided. The  importance of drinking ample fluids to avoid dehydration as a result of the prep emphasized.  HPI, Physical Exam, Assessment and Plan have been scribed under the direction and in the presence of Robert Bellow, MD.  Karie Fetch, RN  I have completed the exam and reviewed the above documentation for accuracy and completeness.  I agree with the above.  Haematologist has been used and any errors in dictation or transcription are unintentional.  Hervey Ard, M.D., F.A.C.S.  Robert Bellow 07/06/2016, 10:15 AM  Patient has been scheduled for a colonoscopy on 08-16-16 at [facility]. This patient has been asked to hold metformin day of colonoscopy prep and procedure. She may continue her Victoza day of prep. Miralax prescription has been sent in to the patient's pharmacy today. Colonoscopy instructions have been reviewed with the patient. This patient is aware to call the office if they have further questions.   Dominga Ferry, CMA

## 2016-07-06 DIAGNOSIS — Z800 Family history of malignant neoplasm of digestive organs: Secondary | ICD-10-CM | POA: Insufficient documentation

## 2016-08-09 ENCOUNTER — Encounter: Payer: Self-pay | Admitting: *Deleted

## 2016-08-14 ENCOUNTER — Telehealth: Payer: Self-pay | Admitting: *Deleted

## 2016-08-14 NOTE — Telephone Encounter (Signed)
Patient was contacted today since she did not respond to My Chart message sent last week and confirms no medication changes since her last office visit.   This patient reports that she has picked up Miralax prescription.  We will proceed with colonoscopy as scheduled for 08-16-16 at [facility].   Patient was encouraged to call the office should she have further questions.

## 2016-08-16 ENCOUNTER — Ambulatory Visit: Payer: BC Managed Care – PPO | Admitting: Anesthesiology

## 2016-08-16 ENCOUNTER — Encounter: Payer: Self-pay | Admitting: *Deleted

## 2016-08-16 ENCOUNTER — Encounter
Admission: RE | Disposition: A | Discharge status: Home / Self Care | Payer: Self-pay | Source: Ambulatory Visit | Attending: General Surgery

## 2016-08-16 ENCOUNTER — Ambulatory Visit
Admission: RE | Admit: 2016-08-16 | Discharge: 2016-08-16 | Disposition: A | Discharge status: Home / Self Care | Payer: BC Managed Care – PPO | Source: Ambulatory Visit | Attending: General Surgery | Admitting: General Surgery

## 2016-08-16 DIAGNOSIS — Z79899 Other long term (current) drug therapy: Secondary | ICD-10-CM | POA: Insufficient documentation

## 2016-08-16 DIAGNOSIS — Z1211 Encounter for screening for malignant neoplasm of colon: Secondary | ICD-10-CM | POA: Diagnosis not present

## 2016-08-16 DIAGNOSIS — Z86010 Personal history of colonic polyps: Secondary | ICD-10-CM | POA: Insufficient documentation

## 2016-08-16 DIAGNOSIS — Z800 Family history of malignant neoplasm of digestive organs: Secondary | ICD-10-CM

## 2016-08-16 DIAGNOSIS — E785 Hyperlipidemia, unspecified: Secondary | ICD-10-CM | POA: Insufficient documentation

## 2016-08-16 DIAGNOSIS — Z791 Long term (current) use of non-steroidal anti-inflammatories (NSAID): Secondary | ICD-10-CM | POA: Insufficient documentation

## 2016-08-16 DIAGNOSIS — F419 Anxiety disorder, unspecified: Secondary | ICD-10-CM | POA: Diagnosis not present

## 2016-08-16 DIAGNOSIS — Z7984 Long term (current) use of oral hypoglycemic drugs: Secondary | ICD-10-CM | POA: Diagnosis not present

## 2016-08-16 DIAGNOSIS — Z87891 Personal history of nicotine dependence: Secondary | ICD-10-CM | POA: Diagnosis not present

## 2016-08-16 DIAGNOSIS — K219 Gastro-esophageal reflux disease without esophagitis: Secondary | ICD-10-CM | POA: Insufficient documentation

## 2016-08-16 DIAGNOSIS — E119 Type 2 diabetes mellitus without complications: Secondary | ICD-10-CM | POA: Diagnosis not present

## 2016-08-16 HISTORY — PX: COLONOSCOPY WITH PROPOFOL: SHX5780

## 2016-08-16 LAB — GLUCOSE, CAPILLARY: Glucose-Capillary: 133 mg/dL — ABNORMAL HIGH (ref 65–99)

## 2016-08-16 SURGERY — COLONOSCOPY WITH PROPOFOL
Anesthesia: General

## 2016-08-16 MED ORDER — PROPOFOL 500 MG/50ML IV EMUL
INTRAVENOUS | Status: AC
  Filled 2016-08-16: qty 50

## 2016-08-16 MED ORDER — SODIUM CHLORIDE 0.9 % IV SOLN
INTRAVENOUS | Status: DC
Start: 2016-08-16 — End: 2016-08-16
  Administered 2016-08-16: 1000 mL via INTRAVENOUS
  Administered 2016-08-16: 08:00:00 via INTRAVENOUS

## 2016-08-16 MED ORDER — LIDOCAINE HCL (PF) 2 % IJ SOLN
INTRAMUSCULAR | Status: AC
  Filled 2016-08-16: qty 2

## 2016-08-16 MED ORDER — PROPOFOL 500 MG/50ML IV EMUL
INTRAVENOUS | Status: DC | PRN
  Administered 2016-08-16: 100 ug/kg/min via INTRAVENOUS

## 2016-08-16 MED ORDER — PROPOFOL 10 MG/ML IV BOLUS
INTRAVENOUS | Status: DC | PRN
  Administered 2016-08-16: 20 mg via INTRAVENOUS
  Administered 2016-08-16: 90 mg via INTRAVENOUS
  Administered 2016-08-16: 20 mg via INTRAVENOUS

## 2016-08-16 MED ORDER — LIDOCAINE HCL (PF) 2 % IJ SOLN
INTRAMUSCULAR | Status: DC | PRN
  Administered 2016-08-16: 2.5 mL via INTRADERMAL

## 2016-08-16 NOTE — Anesthesia Postprocedure Evaluation (Signed)
Anesthesia Post Note  Patient: Hannah Mcdonald  Procedure(s) Performed: Procedure(s) (LRB): COLONOSCOPY WITH PROPOFOL (N/A)  Patient location during evaluation: PACU Anesthesia Type: General Level of consciousness: awake Pain management: pain level controlled Vital Signs Assessment: post-procedure vital signs reviewed and stable Respiratory status: spontaneous breathing Cardiovascular status: stable Anesthetic complications: no     Last Vitals:  Vitals:   08/16/16 0908 08/16/16 0915  BP: 91/74 124/74  Pulse: 84 82  Resp: 16 17  Temp:      Last Pain:  Vitals:   08/16/16 0849  TempSrc: Tympanic                 [name],Hannah Flannery

## 2016-08-16 NOTE — Op Note (Signed)
[facility] Patient Name: Hannah Mcdonald Procedure Date: 08/16/2016 8:13 AM MRN: [id number] Account #: [id number] Date of Birth: 09-03-1955 Admit Type: Outpatient Age: 61 Room: [facility] ENDO ROOM 1 Gender: Female Note Status: Finalized Procedure:            Colonoscopy Indications:          Family history of colon cancer in a first-degree                        relative Providers:            Robert Bellow, MD Referring MD:         [name], MD (Referring MD) Medicines:            Monitored Anesthesia Care Complications:        No immediate complications. Procedure:            Pre-Anesthesia Assessment:                       - Prior to the procedure, a History and Physical was                        performed, and patient medications, allergies and                        sensitivities were reviewed. The patient's tolerance of                        previous anesthesia was reviewed.                       - The risks and benefits of the procedure and the                        sedation options and risks were discussed with the                        patient. All questions were answered and informed                        consent was obtained.                       After obtaining informed consent, the colonoscope was                        passed under direct vision. Throughout the procedure,                        the patient's blood pressure, pulse, and oxygen                        saturations were monitored continuously. The                        Colonoscope was introduced through the anus and                        advanced to the the cecum, identified by the  appendiceal orifice, IC valve and transillumination.                        The colonoscopy was somewhat difficult due to                        significant looping. Successful completion of the                        procedure was aided by using manual  pressure. The                        patient tolerated the procedure well. The quality of                        the bowel preparation was excellent. Findings:      The entire examined colon appeared normal on direct and retroflexion       views. Impression:           - The entire examined colon is normal on direct and                        retroflexion views.                       - No specimens collected. Recommendation:       - Repeat colonoscopy in 5 years for surveillance. Procedure Code(s):    --- Professional ---                       [phone], Colonoscopy, flexible; diagnostic, including                        collection of specimen(s) by brushing or washing, when                        performed (separate procedure) Diagnosis Code(s):    --- Professional ---                       Z80.0, Family history of malignant neoplasm of                        digestive organs CPT copyright 2016 American Medical Association. All rights reserved. The codes documented in this report are preliminary and upon coder review may  be revised to meet current compliance requirements. Robert Bellow, MD 08/16/2016 8:44:41 AM This report has been signed electronically. Number of Addenda: 0 Note Initiated On: 08/16/2016 8:13 AM Scope Withdrawal Time: 0 hours 9 minutes 31 seconds  Total Procedure Duration: 0 hours 23 minutes 25 seconds       [facility]

## 2016-08-16 NOTE — Transfer of Care (Signed)
Immediate Anesthesia Transfer of Care Note  Patient: Hannah Mcdonald  Procedure(s) Performed: Procedure(s): COLONOSCOPY WITH PROPOFOL (N/A)  Patient Location: PACU  Anesthesia Type:General  Level of Consciousness: drowsy  Airway & Oxygen Therapy: Patient connected to nasal cannula oxygen  Post-op Assessment: Report given to RN and Post -op Vital signs reviewed and stable  Post vital signs: Reviewed and stable  Last Vitals:  Vitals:   08/16/16 0848 08/16/16 0849  BP: 98/65 98/65  Pulse: 91   Resp: (!) 23 (!) 22  Temp: (!) 35.6 C (!) 35.5 C    Last Pain:  Vitals:   08/16/16 0849  TempSrc: Tympanic         Complications: No apparent anesthesia complications

## 2016-08-16 NOTE — H&P (Signed)
Hannah Mcdonald [id number] 1955/04/10     HPI: Healthy 61 year old woman for screening colonoscopy. Five-year follow-up based on her father's history of colon cancer at age 74. She tolerated the prep well although she did notice some bloating with nausea. No vomiting. General health has remained good.  Prescriptions Prior to Admission  Medication Sig Dispense Refill Last Dose  . metoprolol succinate (TOPROL-XL) 50 MG 24 hr tablet Take 50 mg by mouth daily. Take with or immediately following a meal.   08/15/2016 at 2000  . atorvastatin (LIPITOR) 40 MG tablet Take 40 mg by mouth daily.   Taking  . busPIRone (BUSPAR) 30 MG tablet Take 30 mg by mouth 2 (two) times daily.   Taking  . Calcium Carbonate (CALCIUM 600 PO) Take 1 tablet by mouth 2 (two) times daily.   Taking  . cyclobenzaprine (FLEXERIL) 10 MG tablet Take 10 mg by mouth at bedtime.   Taking  . liraglutide (VICTOZA) 18 MG/3ML SOPN Inject 1.2 mg into the skin daily.   Taking  . Magnesium 400 MG TABS Take 1 tablet by mouth daily.   Taking  . meloxicam (MOBIC) 15 MG tablet Take 15 mg by mouth daily.   Taking  . metFORMIN (GLUCOPHAGE) 500 MG tablet Take 500 mg by mouth 2 (two) times daily with a meal.   Taking  . omeprazole (PRILOSEC) 20 MG capsule Take 20 mg by mouth daily.   Taking  . polyethylene glycol powder (GLYCOLAX/MIRALAX) powder 255 grams one bottle for colonoscopy prep 255 g 0    No Known Allergies Past Medical History:  Diagnosis Date  . Anxiety   . Diabetes mellitus without complication ([place]) 1157  . GERD (gastroesophageal reflux disease)   . Hyperlipidemia   . Personal history of colonic polyps   . Tachycardia    Past Surgical History:  Procedure Laterality Date  . AUGMENTATION MAMMAPLASTY Bilateral 2620   silicone  . BREAST ENHANCEMENT SURGERY  1983  . [place]  . COLONOSCOPY  2008, 2013  . DILATION AND CURETTAGE OF UTERUS    . ENDOMETRIAL ABLATION  12/1991  . ganglion cyst removal     .  TONSILLECTOMY    . WRIST SURGERY Right 05/1995   Social History   Social History  . Marital status: Married    Spouse name: N/A  . Number of children: N/A  . Years of education: N/A   Occupational History  . Not on file.   Social History Main Topics  . Smoking status: Former Smoker    Packs/day: 1.00    Years: 4.00  . Smokeless tobacco: Never Used  . Alcohol use Yes  . Drug use: No  . Sexual activity: Not on file   Other Topics Concern  . Not on file   Social History Narrative  . No narrative on file   Social History   Social History Narrative  . No narrative on file     ROS: Negative.     PE: HEENT: Negative. Lungs: Clear. Cardio: RR.  Assessment/Plan:  Proceed with planned endoscopy.   Robert Bellow 08/16/2016

## 2016-08-16 NOTE — Anesthesia Post-op Follow-up Note (Cosign Needed)
Anesthesia QCDR form completed.        

## 2016-08-16 NOTE — Anesthesia Preprocedure Evaluation (Signed)
Anesthesia Evaluation  Patient identified by MRN, date of birth, ID band Patient awake    Reviewed: Allergy & Precautions, NPO status , Patient's Chart, lab work & pertinent test results  Airway Mallampati: III       Dental  (+) Teeth Intact   Pulmonary neg pulmonary ROS, former smoker,     + decreased breath sounds      Cardiovascular Exercise Tolerance: Good  Rhythm:Regular Rate:Normal     Neuro/Psych Anxiety    GI/Hepatic Neg liver ROS, GERD  Medicated,  Endo/Other  diabetes, Type 2, Oral Hypoglycemic Agents  Renal/GU negative Renal ROS     Musculoskeletal   Abdominal Normal abdominal exam  (+)   Peds negative pediatric ROS (+)  Hematology negative hematology ROS (+)   Anesthesia Other Findings   Reproductive/Obstetrics                             Anesthesia Physical Anesthesia Plan  ASA: II  Anesthesia Plan: General   Post-op Pain Management:    Induction: Intravenous  PONV Risk Score and Plan: 0  Airway Management Planned: Natural Airway and Nasal Cannula  Additional Equipment:   Intra-op Plan:   Post-operative Plan:   Informed Consent: I have reviewed the patients History and Physical, chart, labs and discussed the procedure including the risks, benefits and alternatives for the proposed anesthesia with the patient or authorized representative who has indicated his/her understanding and acceptance.     Plan Discussed with: CRNA  Anesthesia Plan Comments:         Anesthesia Quick Evaluation

## 2016-08-17 ENCOUNTER — Encounter: Payer: Self-pay | Admitting: General Surgery

## 2016-08-31 ENCOUNTER — Encounter: Payer: Self-pay | Admitting: General Surgery

## 2016-12-06 ENCOUNTER — Other Ambulatory Visit: Payer: Self-pay | Admitting: Internal Medicine

## 2016-12-06 DIAGNOSIS — Z1231 Encounter for screening mammogram for malignant neoplasm of breast: Secondary | ICD-10-CM

## 2016-12-21 ENCOUNTER — Ambulatory Visit
Admission: RE | Admit: 2016-12-21 | Discharge: 2016-12-21 | Disposition: A | Discharge status: Home / Self Care | Payer: BC Managed Care – PPO | Source: Ambulatory Visit | Attending: Internal Medicine | Admitting: Internal Medicine

## 2016-12-21 DIAGNOSIS — Z1231 Encounter for screening mammogram for malignant neoplasm of breast: Secondary | ICD-10-CM | POA: Insufficient documentation

## 2017-11-23 ENCOUNTER — Other Ambulatory Visit: Payer: Self-pay | Admitting: Internal Medicine

## 2017-11-23 DIAGNOSIS — Z1231 Encounter for screening mammogram for malignant neoplasm of breast: Secondary | ICD-10-CM

## 2017-12-25 ENCOUNTER — Ambulatory Visit
Admission: RE | Admit: 2017-12-25 | Discharge: 2017-12-25 | Disposition: A | Discharge status: Home / Self Care | Payer: BC Managed Care – PPO | Source: Ambulatory Visit | Attending: Internal Medicine | Admitting: Internal Medicine

## 2017-12-25 DIAGNOSIS — Z1231 Encounter for screening mammogram for malignant neoplasm of breast: Secondary | ICD-10-CM | POA: Insufficient documentation

## 2019-06-11 ENCOUNTER — Other Ambulatory Visit: Payer: Self-pay | Admitting: Internal Medicine

## 2019-06-11 DIAGNOSIS — Z1231 Encounter for screening mammogram for malignant neoplasm of breast: Secondary | ICD-10-CM

## 2019-07-16 ENCOUNTER — Ambulatory Visit (INDEPENDENT_AMBULATORY_CARE_PROVIDER_SITE_OTHER): Payer: Self-pay | Admitting: Dermatology

## 2019-07-16 ENCOUNTER — Other Ambulatory Visit: Payer: Self-pay

## 2019-07-16 DIAGNOSIS — L988 Other specified disorders of the skin and subcutaneous tissue: Secondary | ICD-10-CM

## 2019-07-16 NOTE — Progress Notes (Signed)
Follow-Up Visit   Subjective  Hannah Mcdonald is a 64 y.o. female who presents for the following: Facial Elastosis (face, pt presents for Botox and fillers today, last Botox 04/01/19).   The following portions of the chart were reviewed this encounter and updated as appropriate:  Tobacco  Allergies  Meds  Problems  Med Hx  Surg Hx  Fam Hx      Review of Systems:  No other skin or systemic complaints except as noted in HPI or Assessment and Plan.  Objective  Well appearing patient in no apparent distress; mood and affect are within normal limits.  A focused examination was performed including face. Relevant physical exam findings are noted in the Assessment and Plan.  Objective  Nose: Rhytides and volume loss.   Images                                           Assessment & Plan  Elastosis of skin Nose  Botox 45.5 units injected as marked: - Frown Complex 27.5 units - Forehead 7.5 units - Botox Comma 1.25 units each side total of 2.5 units - DAOs 4 units each side total of 8 units    Restylane Refyne 1.0cc syringe injected as marked: - Nasolabial folds - Oral Commissures - Upper lip/upper lip vermilion border - Lower lip vermilion border - Chin crease  Botox Injection - Nose Location: Frown complex, forehead, botox commas, DAO's  Informed consent: Discussed risks (infection, pain, bleeding, bruising, swelling, allergic reaction, paralysis of nearby muscles, eyelid droop, double vision, neck weakness, difficulty breathing, headache, undesirable cosmetic result, and need for additional treatment) and benefits of the procedure, as well as the alternatives.  Informed consent was obtained.  Preparation: The area was cleansed with alcohol.  Procedure Details:  Botox was injected into the dermis with a 30-gauge needle. Pressure applied to any bleeding. Ice packs offered for swelling.  Lot Number:  [id number] Expiration:   08/2021  Total Units Injected:  45.5  Plan: Patient was instructed to remain upright for 4 hours. Patient was instructed to avoid massaging the face and avoid vigorous exercise for the rest of the day. Tylenol may be used for headache.  Allow 2 weeks before returning to clinic for additional dosing as needed. Patient will call for any problems.   Filling material injection - Nose Prior to the procedure, the patient's past medical history, allergies and the rare but potential risks and complications were reviewed with the patient and a signed consent was obtained. Pre and post-treatment care was discussed and instructions provided.  Location: Nasolabial folds, oral commissures, upper lip and upper lip vermilion border, lower lip vermilion border, chin crease  Filler Type: Restylane refyne Lot # [id number] expiration 2020-04-26  Procedure: Lidocaine-tetracaine ointment was applied to treatment areas to achieve good local anesthesia. The area was prepped thoroughly with hibiclens After introducing the needle into the desired treatment area, the syringe plunger was drawn back to ensure there was no flash of blood prior to injecting the filler in order to minimize risk of intravascular injection and vascular occlusion. After injection of the filler, the treated areas were cleansed and iced to reduce swelling. Post-treatment instructions were reviewed with the patient.       Patient tolerated the procedure well. The patient will call with any problems, questions or concerns prior to their next appointment.   Return in  about 3 months (around 10/16/2019) for Botox.   I, Othelia Pulling, RMA, am acting as scribe for Sarina Ser, MD .  Documentation: I have reviewed the above documentation for accuracy and completeness, and I agree with the above.  Sarina Ser, MD

## 2019-07-20 ENCOUNTER — Encounter: Payer: Self-pay | Admitting: Dermatology

## 2019-07-21 ENCOUNTER — Ambulatory Visit
Admission: RE | Admit: 2019-07-21 | Discharge: 2019-07-21 | Disposition: A | Discharge status: Home / Self Care | Payer: BC Managed Care – PPO | Source: Ambulatory Visit | Attending: Internal Medicine | Admitting: Internal Medicine

## 2019-07-21 DIAGNOSIS — Z1231 Encounter for screening mammogram for malignant neoplasm of breast: Secondary | ICD-10-CM | POA: Diagnosis not present

## 2019-11-04 ENCOUNTER — Ambulatory Visit: Payer: BC Managed Care – PPO | Admitting: Dermatology

## 2019-11-11 ENCOUNTER — Encounter: Payer: Self-pay | Admitting: Dermatology

## 2019-11-11 ENCOUNTER — Ambulatory Visit (INDEPENDENT_AMBULATORY_CARE_PROVIDER_SITE_OTHER): Payer: Self-pay | Admitting: Dermatology

## 2019-11-11 ENCOUNTER — Other Ambulatory Visit: Payer: Self-pay

## 2019-11-11 DIAGNOSIS — L988 Other specified disorders of the skin and subcutaneous tissue: Secondary | ICD-10-CM

## 2019-11-11 NOTE — Progress Notes (Signed)
   Follow-Up Visit   Subjective  Hannah Mcdonald is a 64 y.o. female who presents for the following: Facial Elastosis (patient is here today for Botox injections).  The following portions of the chart were reviewed this encounter and updated as appropriate:  Tobacco  Allergies  Meds  Problems  Med Hx  Surg Hx  Fam Hx     Review of Systems:  No other skin or systemic complaints except as noted in HPI or Assessment and Plan.  Objective  Well appearing patient in no apparent distress; mood and affect are within normal limits.  A focused examination was performed including the face. Relevant physical exam findings are noted in the Assessment and Plan.  Objective  Face: Rhytides and volume loss.   Images     Assessment & Plan  Elastosis of skin Face  Botox 45.5 units injected as marked: - Frown complex 27.5 units - Botox commas 1.25 units each for a total of 2.5 units - DAO's 4 units each for a total of 8 units - Forehead 7.5 units   Botox Injection - Face Location: See attached image  Informed consent: Discussed risks (infection, pain, bleeding, bruising, swelling, allergic reaction, paralysis of nearby muscles, eyelid droop, double vision, neck weakness, difficulty breathing, headache, undesirable cosmetic result, and need for additional treatment) and benefits of the procedure, as well as the alternatives.  Informed consent was obtained.  Preparation: The area was cleansed with alcohol.  Procedure Details:  Botox was injected into the dermis with a 30-gauge needle. Pressure applied to any bleeding. Ice packs offered for swelling.  Lot Number:  [id number] Expiration:  12/2021  Total Units Injected:  45.5  Plan: Patient was instructed to remain upright for 4 hours. Patient was instructed to avoid massaging the face and avoid vigorous exercise for the rest of the day. Tylenol may be used for headache.  Allow 2 weeks before returning to clinic for additional dosing as  needed. Patient will call for any problems.   Return in about 4 months (around 03/12/2020) for cosmetic - Botox.  Luther Redo, CMA, am acting as scribe for Sarina Ser, MD .  Documentation: I have reviewed the above documentation for accuracy and completeness, and I agree with the above.  Sarina Ser, MD

## 2019-12-03 DIAGNOSIS — M5412 Radiculopathy, cervical region: Secondary | ICD-10-CM | POA: Insufficient documentation

## 2020-03-02 ENCOUNTER — Encounter: Payer: Self-pay | Admitting: Dermatology

## 2020-03-02 ENCOUNTER — Other Ambulatory Visit: Payer: Self-pay

## 2020-03-02 ENCOUNTER — Ambulatory Visit (INDEPENDENT_AMBULATORY_CARE_PROVIDER_SITE_OTHER): Payer: Self-pay | Admitting: Dermatology

## 2020-03-02 DIAGNOSIS — L988 Other specified disorders of the skin and subcutaneous tissue: Secondary | ICD-10-CM

## 2020-03-02 NOTE — Progress Notes (Signed)
   Follow-Up Visit   Subjective  Hannah Mcdonald is a 65 y.o. female who presents for the following: Facial Elastosis (Patient is here today for Botox injections).  The following portions of the chart were reviewed this encounter and updated as appropriate:   Tobacco  Allergies  Meds  Problems  Med Hx  Surg Hx  Fam Hx     Review of Systems:  No other skin or systemic complaints except as noted in HPI or Assessment and Plan.  Objective  Well appearing patient in no apparent distress; mood and affect are within normal limits.  A focused examination was performed including the face. Relevant physical exam findings are noted in the Assessment and Plan.  Objective  Face: Rhytides and volume loss.   Images     Assessment & Plan  Elastosis of skin Face  Botox 45.5 units injected as marked: - Frown complex 27.5 units - Botox commas 1.25 units each for a total of 2.5 units - DAO's 4 units each for a total of 8 units - Forehead 7.5 units   Botox Injection - Face Location: See attached image  Informed consent: Discussed risks (infection, pain, bleeding, bruising, swelling, allergic reaction, paralysis of nearby muscles, eyelid droop, double vision, neck weakness, difficulty breathing, headache, undesirable cosmetic result, and need for additional treatment) and benefits of the procedure, as well as the alternatives.  Informed consent was obtained.  Preparation: The area was cleansed with alcohol.  Procedure Details:  Botox was injected into the dermis with a 30-gauge needle. Pressure applied to any bleeding. Ice packs offered for swelling.  Lot Number: [id number] Expiration:  04/2022  Total Units Injected:  45.5  Plan: Patient was instructed to remain upright for 4 hours. Patient was instructed to avoid massaging the face and avoid vigorous exercise for the rest of the day. Tylenol may be used for headache.  Allow 2 weeks before returning to clinic for additional dosing as  needed. Patient will call for any problems.   Return for Botox in 3-4 months.  I, Joanie Coddington, CMA, am acting as scribe for Armida Sans, MD .  Documentation: I have reviewed the above documentation for accuracy and completeness, and I agree with the above.  Armida Sans, MD

## 2020-03-08 MED FILL — METFORMIN 500MG TAB: 30 days supply | Qty: 120

## 2020-03-08 MED FILL — OMEPRAZOL RX 20MG CAP: 30 days supply | Qty: 30

## 2020-03-08 MED FILL — METOPROL SUC 50MG ER TAB: 30 days supply | Qty: 60

## 2020-03-08 MED FILL — MELOXICAM 15MG TAB: 30 days supply | Qty: 30

## 2020-03-08 MED FILL — CYCLOBENZAPR 10MG TAB: 10 days supply | Qty: 30

## 2020-03-08 MED FILL — VICTOZA 3'S 18MG/3ML PEN: 30 days supply | Qty: 6

## 2020-03-08 MED FILL — BUSPIRONE 30MG TAB: 30 days supply | Qty: 60

## 2020-03-08 MED FILL — ATORVASTATIN 20MG TAB: 30 days supply | Qty: 30

## 2020-03-19 MED FILL — ID NOW COVID19 RAPID DIAGNOSTIC TST: 1 days supply | Qty: 1 | Fill #0

## 2020-04-08 MED FILL — CYCLOBENZAPR 10MG TAB: 10 days supply | Qty: 30

## 2020-04-08 MED FILL — METFORMIN 500MG TAB: 30 days supply | Qty: 120

## 2020-04-08 MED FILL — OMEPRAZOL RX 20MG CAP: 30 days supply | Qty: 30

## 2020-04-08 MED FILL — BUSPIRONE 30MG TAB: 30 days supply | Qty: 60

## 2020-04-08 MED FILL — ATORVASTATIN 20MG TAB: 30 days supply | Qty: 30

## 2020-04-08 MED FILL — VICTOZA 3'S 18MG/3ML PEN: 30 days supply | Qty: 6

## 2020-04-08 MED FILL — METOPROL SUC 50MG ER TAB: 30 days supply | Qty: 60

## 2020-04-08 MED FILL — MELOXICAM 15MG TAB: 30 days supply | Qty: 30

## 2020-05-11 MED FILL — ATORVASTATIN 20MG TAB: 30 days supply | Qty: 30

## 2020-05-11 MED FILL — CYCLOBENZAPR 10MG TAB: 10 days supply | Qty: 30

## 2020-05-11 MED FILL — OMEPRAZOL RX 20MG CAP: 30 days supply | Qty: 30

## 2020-05-11 MED FILL — MELOXICAM 15MG TAB: 30 days supply | Qty: 30

## 2020-05-11 MED FILL — METFORMIN 500MG TAB: 30 days supply | Qty: 120

## 2020-05-11 MED FILL — BUSPIRONE 30MG TAB: 30 days supply | Qty: 60

## 2020-05-11 MED FILL — METOPROL SUC 50MG ER TAB: 30 days supply | Qty: 60

## 2020-05-11 MED FILL — VICTOZA 3'S 18MG/3ML PEN: 30 days supply | Qty: 6

## 2020-06-10 MED FILL — CYCLOBENZAPR 10MG TAB: 10 days supply | Qty: 30

## 2020-06-10 MED FILL — OMEPRAZOL RX 20MG CAP: 30 days supply | Qty: 30

## 2020-06-10 MED FILL — MELOXICAM 15MG TAB: 30 days supply | Qty: 30

## 2020-06-10 MED FILL — ATORVASTATIN 20MG TAB: 30 days supply | Qty: 30

## 2020-06-10 MED FILL — VICTOZA 3'S 18MG/3ML PEN: 30 days supply | Qty: 6

## 2020-06-10 MED FILL — METFORMIN 500MG TAB: 30 days supply | Qty: 120

## 2020-06-10 MED FILL — BUSPIRONE 30MG TAB: 30 days supply | Qty: 60

## 2020-06-10 MED FILL — METOPROL SUC 50MG ER TAB: 30 days supply | Qty: 60

## 2020-06-15 ENCOUNTER — Ambulatory Visit (INDEPENDENT_AMBULATORY_CARE_PROVIDER_SITE_OTHER): Payer: Self-pay | Admitting: Dermatology

## 2020-06-15 ENCOUNTER — Other Ambulatory Visit: Payer: Self-pay

## 2020-06-15 DIAGNOSIS — L988 Other specified disorders of the skin and subcutaneous tissue: Secondary | ICD-10-CM

## 2020-06-15 NOTE — Progress Notes (Signed)
   Follow-Up Visit   Subjective  Hannah Mcdonald is a 65 y.o. female who presents for the following: Facial Elastosis (Botox today).  The following portions of the chart were reviewed this encounter and updated as appropriate:   Tobacco  Allergies  Meds  Problems  Med Hx  Surg Hx  Fam Hx     Review of Systems:  No other skin or systemic complaints except as noted in HPI or Assessment and Plan.  Objective  Well appearing patient in no apparent distress; mood and affect are within normal limits.  A focused examination was performed including face. Relevant physical exam findings are noted in the Assessment and Plan.  Objective  Face: Rhytides and volume loss.   Images     Assessment & Plan  Elastosis of skin Face  Botox 45.5 units injected as marked: - Frown complex 27.5 units - Botox commas 1.25 units each for a total of 2.5 units - DAO's 4 units each for a total of 8 units - Forehead 7.5 units   Botox Injection - Face Location: See attached image  Informed consent: Discussed risks (infection, pain, bleeding, bruising, swelling, allergic reaction, paralysis of nearby muscles, eyelid droop, double vision, neck weakness, difficulty breathing, headache, undesirable cosmetic result, and need for additional treatment) and benefits of the procedure, as well as the alternatives.  Informed consent was obtained.  Preparation: The area was cleansed with alcohol.  Procedure Details:  Botox was injected into the dermis with a 30-gauge needle. Pressure applied to any bleeding. Ice packs offered for swelling.  Lot Number:  [id number] Expiration:  06/2022  Total Units Injected:  45.5  Plan: Patient was instructed to remain upright for 4 hours. Patient was instructed to avoid massaging the face and avoid vigorous exercise for the rest of the day. Tylenol may be used for headache.  Allow 2 weeks before returning to clinic for additional dosing as needed. Patient will call for any  problems.   Return for Botox in 3-4 months.  I, Ashok Cordia, CMA, am acting as scribe for Sarina Ser, MD .  Documentation: I have reviewed the above documentation for accuracy and completeness, and I agree with the above.  Sarina Ser, MD

## 2020-06-16 ENCOUNTER — Encounter: Payer: Self-pay | Admitting: Dermatology

## 2020-06-17 MED FILL — BINAXNOW COVID19 HOME TEST KIT: 1 days supply | Qty: 8 | Fill #0

## 2020-06-17 MED FILL — PREDNISONE 10MG  PAK: 6 days supply | Qty: 21 | Fill #0

## 2020-06-17 MED FILL — SCOPOLAMINE 1MG/3DAY DIS: 24 days supply | Qty: 8 | Fill #0

## 2020-06-29 MED FILL — CYCLOBENZAPR 10MG TAB: 10 days supply | Qty: 30

## 2020-06-29 MED FILL — METOPROL SUC 50MG ER TAB: 30 days supply | Qty: 60

## 2020-06-29 MED FILL — BUSPIRONE 30MG TAB: 30 days supply | Qty: 60

## 2020-06-29 MED FILL — MELOXICAM 15MG TAB: 30 days supply | Qty: 30

## 2020-06-29 MED FILL — VICTOZA 3'S 18MG/3ML PEN: 30 days supply | Qty: 6

## 2020-06-29 MED FILL — OMEPRAZOL RX 20MG CAP: 30 days supply | Qty: 30

## 2020-06-29 MED FILL — ATORVASTATIN 20MG TAB: 30 days supply | Qty: 30

## 2020-06-29 MED FILL — METFORMIN 500MG TAB: 30 days supply | Qty: 120

## 2020-07-01 MED FILL — ID NOW COVID19 RAPID DIAGNOSTIC TST: 1 days supply | Qty: 1 | Fill #0

## 2020-07-13 MED FILL — OXYCODONE/ACETAMINOPHEN 5-325MG TAB: 3 days supply | Qty: 12 | Fill #0

## 2020-07-21 ENCOUNTER — Other Ambulatory Visit: Payer: Self-pay

## 2020-07-21 ENCOUNTER — Encounter: Payer: Self-pay | Admitting: Oncology

## 2020-07-21 ENCOUNTER — Inpatient Hospital Stay: Payer: BC Managed Care – PPO

## 2020-07-21 ENCOUNTER — Inpatient Hospital Stay: Payer: BC Managed Care – PPO | Attending: Oncology | Admitting: Oncology

## 2020-07-21 VITALS — BP 130/75 | HR 88 | Temp 95.1°F | Resp 17 | Ht 62.0 in | Wt 152.6 lb

## 2020-07-21 DIAGNOSIS — M899 Disorder of bone, unspecified: Secondary | ICD-10-CM | POA: Insufficient documentation

## 2020-07-21 DIAGNOSIS — R748 Abnormal levels of other serum enzymes: Secondary | ICD-10-CM | POA: Diagnosis not present

## 2020-07-21 DIAGNOSIS — Z1231 Encounter for screening mammogram for malignant neoplasm of breast: Secondary | ICD-10-CM

## 2020-07-21 DIAGNOSIS — Z79899 Other long term (current) drug therapy: Secondary | ICD-10-CM | POA: Insufficient documentation

## 2020-07-21 LAB — COMPREHENSIVE METABOLIC PANEL
ALT: 62 U/L — ABNORMAL HIGH (ref 0–44)
AST: 64 U/L — ABNORMAL HIGH (ref 15–41)
Albumin: 4.9 g/dL (ref 3.5–5.0)
Alkaline Phosphatase: 76 U/L (ref 38–126)
Anion gap: 16 — ABNORMAL HIGH (ref 5–15)
BUN: 24 mg/dL — ABNORMAL HIGH (ref 8–23)
CO2: 22 mmol/L (ref 22–32)
Calcium: 9.7 mg/dL (ref 8.9–10.3)
Chloride: 101 mmol/L (ref 98–111)
Creatinine, Ser: 0.78 mg/dL (ref 0.44–1.00)
GFR, Estimated: >60 mL/min (ref >60)
Glucose, Bld: 130 mg/dL — ABNORMAL HIGH (ref 70–99)
Potassium: 4.3 mmol/L (ref 3.5–5.1)
Sodium: 139 mmol/L (ref 135–145)
Total Bilirubin: 0.7 mg/dL (ref 0.3–1.2)
Total Protein: 7.5 g/dL (ref 6.5–8.1)

## 2020-07-21 LAB — CBC WITH DIFFERENTIAL/PLATELET
Abs Immature Granulocytes: 0.01 10*3/uL (ref 0.00–0.07)
Basophils Absolute: 0 10*3/uL (ref 0.0–0.1)
Basophils Relative: 0 %
Eosinophils Absolute: 0.1 10*3/uL (ref 0.0–0.5)
Eosinophils Relative: 2 %
HCT: 39.7 % (ref 36.0–46.0)
Hemoglobin: 13 g/dL (ref 12.0–15.0)
Immature Granulocytes: 0 %
Lymphocytes Relative: 36 %
Lymphs Abs: 2 10*3/uL (ref 0.7–4.0)
MCH: 32.7 pg (ref 26.0–34.0)
MCHC: 32.7 g/dL (ref 30.0–36.0)
MCV: 99.7 fL (ref 80.0–100.0)
Monocytes Absolute: 0.4 10*3/uL (ref 0.1–1.0)
Monocytes Relative: 8 %
Neutro Abs: 3 10*3/uL (ref 1.7–7.7)
Neutrophils Relative %: 54 %
Platelets: 268 10*3/uL (ref 150–400)
RBC: 3.98 MIL/uL (ref 3.87–5.11)
RDW: 13.8 % (ref 11.5–15.5)
WBC: 5.5 10*3/uL (ref 4.0–10.5)
nRBC: 0 % (ref 0.0–0.2)

## 2020-07-21 NOTE — Progress Notes (Signed)
[facility]  Telephone:([phone]) [phone] Fax:(336) [phone]  ID: [name] OB: [birth year]  MR#: [id number]  BUL#:[id number]  Patient Care Team: Albina Billet, MD as PCP - General (Internal Medicine) Bary Castilla Forest Gleason, MD as Consulting Physician (General Surgery)  CHIEF COMPLAINT: Incidental bony lytic lesion.  INTERVAL HISTORY: Patient is a 65 year old female who was recently on a cruise and had a fall, but did not sustain injury.  She continued to have pain which subsequently led to a CT scan at [facility] which revealed an incidental lytic bone lesion in her pelvis.  She currently feels well and is asymptomatic.  She has no neurologic complaints.  She denies any recent fevers or illnesses.  She has a good appetite and denies weight loss.  She has no chest pain, shortness of breath, cough, or hemoptysis.  She denies any nausea, vomiting, constipation, or diarrhea.  She has no urinary complaints.  Patient feels at her baseline and offers no specific complaints today.  REVIEW OF SYSTEMS:   Review of Systems  Constitutional: Negative.  Negative for fever, malaise/fatigue and weight loss.  Respiratory: Negative.  Negative for cough, hemoptysis and shortness of breath.   Cardiovascular: Negative.  Negative for chest pain and leg swelling.  Gastrointestinal: Negative.  Negative for abdominal pain.  Genitourinary: Negative.  Negative for dysuria.  Musculoskeletal: Negative for back pain and joint pain.  Skin: Negative.  Negative for rash.  Neurological: Negative.  Negative for dizziness, focal weakness, weakness and headaches.  Psychiatric/Behavioral: Negative.  The patient is not nervous/anxious.     As per HPI. Otherwise, a complete review of systems is negative.  PAST MEDICAL HISTORY: Past Medical History:  Diagnosis Date  . Anxiety   . Diabetes mellitus without complication ([place]) 3212  . GERD (gastroesophageal reflux disease)   . Hyperlipidemia   .  Personal history of colonic polyps   . Squamous cell carcinoma of skin 11/25/2013   Left chest. WD SCC with superficial infiltration.  . Tachycardia     PAST SURGICAL HISTORY: Past Surgical History:  Procedure Laterality Date  . AUGMENTATION MAMMAPLASTY Bilateral 2482   silicone  . BREAST ENHANCEMENT SURGERY  1983  . [place]  . COLONOSCOPY  2008, 2013  . COLONOSCOPY WITH PROPOFOL N/A 08/16/2016   Procedure: COLONOSCOPY WITH PROPOFOL;  Surgeon: Robert Bellow, MD;  Location: [facility] ENDOSCOPY;  Service: Endoscopy;  Laterality: N/A;  . DILATION AND CURETTAGE OF UTERUS    . ENDOMETRIAL ABLATION  12/1991  . ganglion cyst removal     . TONSILLECTOMY    . WRIST SURGERY Right 05/1995    FAMILY HISTORY: Family History  Problem Relation Age of Onset  . Colon polyps Sister   . Colon cancer Father 24  . Diabetes Mother   . Breast cancer Neg Hx     ADVANCED DIRECTIVES (Y/N):  N  HEALTH MAINTENANCE: Social History   Tobacco Use  . Smoking status: Former Smoker    Packs/day: 1.00    Years: 4.00    Pack years: 4.00  . Smokeless tobacco: Never Used  Substance Use Topics  . Alcohol use: Yes  . Drug use: No     Colonoscopy:  PAP:  Bone density:  Lipid panel:  No Known Allergies  Current Outpatient Medications  Medication Sig Dispense Refill  . atorvastatin (LIPITOR) 40 MG tablet Take 40 mg by mouth daily.    . busPIRone (BUSPAR) 30 MG tablet Take 30 mg by mouth 2 (two)  times daily.    . Calcium Carbonate (CALCIUM 600 PO) Take 1 tablet by mouth 2 (two) times daily.    . cyclobenzaprine (FLEXERIL) 10 MG tablet Take 10 mg by mouth at bedtime.    . liraglutide (VICTOZA) 18 MG/3ML SOPN Inject 1.2 mg into the skin daily.    . Magnesium 400 MG TABS Take 1 tablet by mouth daily.    . meloxicam (MOBIC) 15 MG tablet Take 15 mg by mouth daily.    . metFORMIN (GLUCOPHAGE) 500 MG tablet Take 500 mg by mouth 2 (two) times daily with a meal.    . metoprolol  succinate (TOPROL-XL) 50 MG 24 hr tablet Take 50 mg by mouth daily. Take with or immediately following a meal.    . omeprazole (PRILOSEC) 20 MG capsule Take 20 mg by mouth daily.    . polyethylene glycol powder (GLYCOLAX/MIRALAX) powder 255 grams one bottle for colonoscopy prep (Patient not taking: Reported on 07/21/2020) 255 g 0   No current facility-administered medications for this visit.    OBJECTIVE: Vitals:   07/21/20 0928  BP: 130/75  Pulse: 88  Resp: 17  Temp: (!) 95.1 F (35.1 C)  SpO2: 100%     Body mass index is 27.91 kg/m.    ECOG FS:0 - Asymptomatic  General: Well-developed, well-nourished, no acute distress. Eyes: Pink conjunctiva, anicteric sclera. HEENT: Normocephalic, moist mucous membranes. Lungs: No audible wheezing or coughing. Heart: Regular rate and rhythm. Abdomen: Soft, nontender, no obvious distention. Musculoskeletal: No edema, cyanosis, or clubbing. Neuro: Alert, answering all questions appropriately. Cranial nerves grossly intact. Skin: No rashes or petechiae noted. Psych: Normal affect. Lymphatics: No cervical, calvicular, axillary or inguinal LAD.   LAB RESULTS:  Lab Results  Component Value Date   NA 139 07/21/2020   K 4.3 07/21/2020   CL 101 07/21/2020   CO2 22 07/21/2020   GLUCOSE 130 (H) 07/21/2020   BUN 24 (H) 07/21/2020   CREATININE 0.78 07/21/2020   CALCIUM 9.7 07/21/2020   PROT 7.5 07/21/2020   ALBUMIN 4.9 07/21/2020   AST 64 (H) 07/21/2020   ALT 62 (H) 07/21/2020   ALKPHOS 76 07/21/2020   BILITOT 0.7 07/21/2020   GFRNONAA >60 07/21/2020    Lab Results  Component Value Date   WBC 5.5 07/21/2020   NEUTROABS 3.0 07/21/2020   HGB 13.0 07/21/2020   HCT 39.7 07/21/2020   MCV 99.7 07/21/2020   PLT 268 07/21/2020     STUDIES: No results found.  ASSESSMENT:  Incidental bony lytic lesion.  PLAN:    1.  Incidental bony lytic lesion: Images not available at time of dictation, but report on Jul 13, 2020 from [facility] revealed a 2 cm right iliac lytic lesion highly suspicious for underlying metastatic deposit.  All tumor markers are negative.  Patient does not appear to have any evidence of underlying myeloma, but SPEP is pending at time of dictation.  Will get CT of chest, abdomen, and pelvis for further evaluation and to assess for possible primary.  Patient will return to clinic 1 to 2 days after her imaging to discuss the results and additional diagnostic planning necessary. 2.  Breast health: Patient last had a screening mammogram on Jul 21, 2019.  This has been ordered for completeness. 3.  Elevated liver enzymes: Mild, monitor.  I spent a total of 60 minutes reviewing chart data, face-to-face evaluation with the patient, counseling and coordination of care as detailed above.   Patient expressed understanding and was  in agreement with this plan. She also understands that She can call clinic at any time with any questions, concerns, or complaints.   Cancer Staging No matching staging information was found for the patient.  Lloyd Huger, MD   07/22/2020 8:35 AM

## 2020-07-22 LAB — KAPPA/LAMBDA LIGHT CHAINS
Kappa free light chain: 9.5 mg/L (ref 3.3–19.4)
Kappa, lambda light chain ratio: 0.02 — ABNORMAL LOW (ref 0.26–1.65)
Lambda free light chains: 432.1 mg/L — ABNORMAL HIGH (ref 5.7–26.3)

## 2020-07-22 LAB — CA 125: Cancer Antigen (CA) 125: 6.8 U/mL (ref 0.0–38.1)

## 2020-07-22 LAB — IGG, IGA, IGM
IgA: 72 mg/dL — ABNORMAL LOW (ref 87–352)
IgG (Immunoglobin G), Serum: 430 mg/dL — ABNORMAL LOW (ref 586–1602)
IgM (Immunoglobulin M), Srm: 16 mg/dL — ABNORMAL LOW (ref 26–217)

## 2020-07-22 LAB — CANCER ANTIGEN 27.29: CA 27.29: 20.5 U/mL (ref 0.0–38.6)

## 2020-07-22 LAB — CANCER ANTIGEN 19-9: CA 19-9: 19 U/mL (ref 0–35)

## 2020-07-22 LAB — CEA: CEA: 3.2 ng/mL (ref 0.0–4.7)

## 2020-07-23 LAB — PROTEIN ELECTROPHORESIS, SERUM
A/G Ratio: 1.7 (ref 0.7–1.7)
Albumin ELP: 4.5 g/dL — ABNORMAL HIGH (ref 2.9–4.4)
Alpha-1-Globulin: 0.2 g/dL (ref 0.0–0.4)
Alpha-2-Globulin: 1.1 g/dL — ABNORMAL HIGH (ref 0.4–1.0)
Beta Globulin: 1.1 g/dL (ref 0.7–1.3)
Gamma Globulin: 0.3 g/dL — ABNORMAL LOW (ref 0.4–1.8)
Globulin, Total: 2.7 g/dL (ref 2.2–3.9)
Total Protein ELP: 7.2 g/dL (ref 6.0–8.5)

## 2020-07-27 MED FILL — MELOXICAM 15MG TAB: 30 days supply | Qty: 30

## 2020-07-27 MED FILL — CYCLOBENZAPR 10MG TAB: 10 days supply | Qty: 30

## 2020-07-27 MED FILL — METFORMIN 500MG TAB: 30 days supply | Qty: 120

## 2020-07-27 MED FILL — OMEPRAZOL RX 20MG CAP: 30 days supply | Qty: 30

## 2020-07-27 MED FILL — VICTOZA 3'S 18MG/3ML PEN: 30 days supply | Qty: 6

## 2020-07-27 MED FILL — ATORVASTATIN 20MG TAB: 30 days supply | Qty: 30

## 2020-07-27 MED FILL — METOPROL SUC 50MG ER TAB: 30 days supply | Qty: 60

## 2020-07-28 ENCOUNTER — Ambulatory Visit
Admission: RE | Admit: 2020-07-28 | Discharge: 2020-07-28 | Disposition: A | Discharge status: Home / Self Care | Payer: BC Managed Care – PPO | Source: Ambulatory Visit | Attending: Oncology | Admitting: Oncology

## 2020-07-28 ENCOUNTER — Other Ambulatory Visit: Payer: Self-pay

## 2020-07-28 DIAGNOSIS — Z1231 Encounter for screening mammogram for malignant neoplasm of breast: Secondary | ICD-10-CM | POA: Diagnosis not present

## 2020-07-30 NOTE — Progress Notes (Addendum)
[place]  [phone] [phone]  ID: Hannah Mcdonald OB: April 14, 1955  MR#: [id number]  ZSW#:[id number]  Patient Care Team: Albina Billet, MD as PCP - General (Internal Medicine) Bary Castilla Forest Gleason, MD as Consulting Physician (General Surgery)  CHIEF COMPLAINT: Incidental bony lytic lesion.  INTERVAL HISTORY: Patient returns to clinic today for further evaluation and discussion of her imaging and laboratory results.  She continues to have low back right hip pain, but otherwise feels well.  She has no neurologic complaints.  She denies any recent fevers or illnesses.  She has a good appetite and denies weight loss.  She has no chest pain, shortness of breath, cough, or hemoptysis.  She denies any nausea, vomiting, constipation, or diarrhea.  She has no urinary complaints.  Patient offers no further specific complaints today.  REVIEW OF SYSTEMS:   Review of Systems  Constitutional: Negative.  Negative for fever, malaise/fatigue and weight loss.  Respiratory: Negative.  Negative for cough, hemoptysis and shortness of breath.   Cardiovascular: Negative.  Negative for chest pain and leg swelling.  Gastrointestinal: Negative.  Negative for abdominal pain.  Genitourinary: Negative.  Negative for dysuria.  Musculoskeletal:  Positive for back pain and joint pain.  Skin: Negative.  Negative for rash.  Neurological: Negative.  Negative for dizziness, focal weakness, weakness and headaches.  Psychiatric/Behavioral: Negative.  The patient is not nervous/anxious.    As per HPI. Otherwise, a complete review of systems is negative.  PAST MEDICAL HISTORY: Past Medical History:  Diagnosis Date   Anxiety    Diabetes mellitus without complication ([place]) 3220   GERD (gastroesophageal reflux disease)    Hyperlipidemia    Personal history of colonic polyps    Squamous cell carcinoma of skin 11/25/2013   Left chest. WD SCC with superficial infiltration.    Tachycardia     PAST SURGICAL HISTORY: Past Surgical History:  Procedure Laterality Date   AUGMENTATION MAMMAPLASTY Bilateral 2542   silicone   [place], 1989   COLONOSCOPY  2008, 2013   COLONOSCOPY WITH PROPOFOL N/A 08/16/2016   Procedure: COLONOSCOPY WITH PROPOFOL;  Surgeon: Robert Bellow, MD;  Location: [facility] ENDOSCOPY;  Service: Endoscopy;  Laterality: N/A;   DILATION AND CURETTAGE OF UTERUS     ENDOMETRIAL ABLATION  12/1991   ganglion cyst removal      TONSILLECTOMY     WRIST SURGERY Right 05/1995    FAMILY HISTORY: Family History  Problem Relation Age of Onset   Colon polyps Sister    Colon cancer Father 79   Diabetes Mother    Breast cancer Neg Hx     ADVANCED DIRECTIVES (Y/N):  N  HEALTH MAINTENANCE: Social History   Tobacco Use   Smoking status: Former    Packs/day: 1.00    Years: 4.00    Pack years: 4.00    Types: Cigarettes   Smokeless tobacco: Never  Substance Use Topics   Alcohol use: Yes   Drug use: No     Colonoscopy:  PAP:  Bone density:  Lipid panel:  No Known Allergies  Current Outpatient Medications  Medication Sig Dispense Refill   atorvastatin (LIPITOR) 40 MG tablet Take 40 mg by mouth daily.     busPIRone (BUSPAR) 30 MG tablet Take 30 mg by mouth 2 (two) times daily.     Calcium Carbonate (CALCIUM 600 PO) Take 1 tablet by mouth 2 (two) times daily.     cyclobenzaprine (  FLEXERIL) 10 MG tablet Take 10 mg by mouth at bedtime.     liraglutide (VICTOZA) 18 MG/3ML SOPN Inject 1.2 mg into the skin daily.     Magnesium 400 MG TABS Take 1 tablet by mouth daily.     meloxicam (MOBIC) 15 MG tablet Take 15 mg by mouth daily.     metFORMIN (GLUCOPHAGE) 500 MG tablet Take 500 mg by mouth 2 (two) times daily with a meal.     metoprolol succinate (TOPROL-XL) 50 MG 24 hr tablet Take 50 mg by mouth daily. Take with or immediately following a meal.     omeprazole (PRILOSEC) 20 MG capsule Take 20 mg  by mouth daily.     traMADol (ULTRAM) 50 MG tablet Take 1 tablet (50 mg total) by mouth 2 (two) times daily as needed for moderate pain. 60 tablet 0   No current facility-administered medications for this visit.    OBJECTIVE: Vitals:   08/05/20 1016  BP: 133/77  Pulse: 83  Resp: 20  Temp: (!) 97 F (36.1 C)  SpO2: 99%     Body mass index is 27.98 kg/m.    ECOG FS:0 - Asymptomatic  General: Well-developed, well-nourished, no acute distress. Eyes: Pink conjunctiva, anicteric sclera. HEENT: Normocephalic, moist mucous membranes. Lungs: No audible wheezing or coughing. Heart: Regular rate and rhythm. Abdomen: Soft, nontender, no obvious distention. Musculoskeletal: No edema, cyanosis, or clubbing. Neuro: Alert, answering all questions appropriately. Cranial nerves grossly intact. Skin: No rashes or petechiae noted. Psych: Normal affect.   LAB RESULTS:  Lab Results  Component Value Date   NA 139 07/21/2020   K 4.3 07/21/2020   CL 101 07/21/2020   CO2 22 07/21/2020   GLUCOSE 130 (H) 07/21/2020   BUN 24 (H) 07/21/2020   CREATININE 0.78 07/21/2020   CALCIUM 9.7 07/21/2020   PROT 7.5 07/21/2020   ALBUMIN 4.9 07/21/2020   AST 64 (H) 07/21/2020   ALT 62 (H) 07/21/2020   ALKPHOS 76 07/21/2020   BILITOT 0.7 07/21/2020   GFRNONAA >60 07/21/2020    Lab Results  Component Value Date   WBC 5.5 07/21/2020   NEUTROABS 3.0 07/21/2020   HGB 13.0 07/21/2020   HCT 39.7 07/21/2020   MCV 99.7 07/21/2020   PLT 268 07/21/2020     STUDIES: CT CHEST ABDOMEN PELVIS W CONTRAST  Result Date: 08/03/2020 CLINICAL DATA:  Lytic lesion. EXAM: CT CHEST, ABDOMEN, AND PELVIS WITH CONTRAST TECHNIQUE: Multidetector CT imaging of the chest, abdomen and pelvis was performed following the standard protocol during bolus administration of intravenous contrast. CONTRAST:  117mL OMNIPAQUE IOHEXOL 300 MG/ML  SOLN COMPARISON:  None. FINDINGS: CT CHEST FINDINGS Cardiovascular: Heart size normal.  No  pericardial effusion. Mediastinum/Nodes: No pathologically enlarged mediastinal, hilar or axillary lymph nodes. Esophagus is grossly unremarkable. Lungs/Pleura: Lungs are clear. No pleural fluid. Airway is unremarkable. Musculoskeletal: No worrisome lytic or sclerotic lesions. Mild degenerative changes in the spine. T4 and T5 superior endplate compression fractures, age indeterminate. CT ABDOMEN PELVIS FINDINGS Hepatobiliary: Liver is decreased in attenuation diffusely and is slightly enlarged, 20.5 cm. Liver and gallbladder are otherwise unremarkable. No biliary ductal dilatation. Pancreas: Negative. Spleen: Negative. Adrenals/Urinary Tract: Adrenal glands are unremarkable. Cortical scarring in the kidneys bilaterally. Tiny stone in the lower pole left kidney. Ureters are decompressed. Bladder is grossly unremarkable. Stomach/Bowel: Stomach, small bowel, appendix and colon are unremarkable. Vascular/Lymphatic: Atherosclerotic calcification of the aorta. Retroaortic left renal vein. No pathologically enlarged lymph nodes. Reproductive: Uterus is visualized.  No adnexal mass.  Other: Bilateral inguinal hernias contain fat. No free fluid. Mesenteries and peritoneum are unremarkable. Musculoskeletal: Expansile lytic lesion and associated pathologic fracture in the superior aspect of the right iliac wing. Soft tissue component measures 2.5 x 2.9 cm (2/82). An additional expansile lytic lesion in the L3 vertebral body (2/70. IMPRESSION: 1. Expansile lytic lesions in the L3 vertebral body and right iliac wing. Associated pathologic fracture in the right iliac wing. No additional evidence of primary or metastatic disease in the chest, abdomen or pelvis. 2. Steatotic enlarged liver. 3. Tiny left renal stone. 4.  Aortic atherosclerosis (ICD10-I70.0). Electronically Signed   By: Lorin Picket M.D.   On: 08/03/2020 12:21   MM 3D SCREEN BREAST W/IMPLANT BILATERAL  Result Date: 07/29/2020 CLINICAL DATA:  Screening. EXAM:  DIGITAL SCREENING BILATERAL MAMMOGRAM WITH IMPLANTS, CAD AND TOMOSYNTHESIS TECHNIQUE: Bilateral screening digital craniocaudal and mediolateral oblique mammograms were obtained. Bilateral screening digital breast tomosynthesis was performed. The images were evaluated with computer-aided detection. Standard and/or implant displaced views were performed. COMPARISON:  Previous exam(s). ACR Breast Density Category c: The breast tissue is heterogeneously dense, which may obscure small masses. FINDINGS: The patient has retropectoral implants. There are no findings suspicious for malignancy. IMPRESSION: No mammographic evidence of malignancy. A result letter of this screening mammogram will be mailed directly to the patient. RECOMMENDATION: Screening mammogram in one year. (Code:SM-B-01Y) BI-RADS CATEGORY  1:  Negative. Electronically Signed   By: Claudie Revering M.D.   On: 07/29/2020 12:51     ASSESSMENT:  Incidental bony lytic lesion.  PLAN:    1.  Incidental bony lytic lesion: Images not available at time of dictation, but report on Jul 13, 2020 from [facility] revealed a 2 cm right iliac lytic lesion highly suspicious for underlying metastatic deposit.  All tumor markers are negative.  Patient does not appear to have underlying myeloma with a negative SPEP, but her lambda chains are elevated therefore possible plasmacytoma remains on the differential.  Mammogram on July 29, 2020 was reported as BI-RADS 1.  Patient states she had a colonoscopy approximately 4 years ago that was reported as normal.  CT of the chest, abdomen, pelvis from August 03, 2020 reviewed independently and reported as above confirming expansile lytic lesion in right iliac crest measuring 2.5 x 2.9 cm.  Patient also has a second lesion on L3 vertebrae.  No other evidence of disease.  Have ordered CT-guided biopsy of iliac crest lesion for diagnosis.  Patient will return to clinic approximately 1 week after her biopsy to discuss the results  and treatment planning.  She will also have consultation with radiation oncology that day for palliative pain control.   2.  Breast health: Mammogram as above. 3.  Elevated liver enzymes: Mild, monitor.  Liver anatomy appears to be within normal limits on imaging.  I spent a total of 30 minutes reviewing chart data, face-to-face evaluation with the patient, counseling and coordination of care as detailed above.    Patient expressed understanding and was in agreement with this plan. She also understands that She can call clinic at any time with any questions, concerns, or complaints.   Cancer Staging No matching staging information was found for the patient.  Lloyd Huger, MD   08/06/2020 12:25 PM

## 2020-08-03 ENCOUNTER — Ambulatory Visit
Admission: RE | Admit: 2020-08-03 | Discharge: 2020-08-03 | Disposition: A | Discharge status: Home / Self Care | Payer: BC Managed Care – PPO | Source: Ambulatory Visit | Attending: Oncology | Admitting: Oncology

## 2020-08-03 ENCOUNTER — Other Ambulatory Visit: Payer: Self-pay

## 2020-08-03 DIAGNOSIS — M899 Disorder of bone, unspecified: Secondary | ICD-10-CM | POA: Diagnosis not present

## 2020-08-03 MED ORDER — IOHEXOL 300 MG/ML  SOLN
100.0000 mL | Freq: Once | INTRAMUSCULAR | Status: AC | PRN
  Administered 2020-08-03: 100 mL via INTRAVENOUS

## 2020-08-05 ENCOUNTER — Encounter: Payer: Self-pay | Admitting: Oncology

## 2020-08-05 ENCOUNTER — Inpatient Hospital Stay: Payer: BC Managed Care – PPO | Attending: Oncology | Admitting: Oncology

## 2020-08-05 VITALS — BP 133/77 | HR 83 | Temp 97.0°F | Resp 20 | Wt 153.0 lb

## 2020-08-05 DIAGNOSIS — Z79899 Other long term (current) drug therapy: Secondary | ICD-10-CM | POA: Insufficient documentation

## 2020-08-05 DIAGNOSIS — M899 Disorder of bone, unspecified: Secondary | ICD-10-CM | POA: Diagnosis not present

## 2020-08-05 DIAGNOSIS — Z7984 Long term (current) use of oral hypoglycemic drugs: Secondary | ICD-10-CM | POA: Insufficient documentation

## 2020-08-05 DIAGNOSIS — E785 Hyperlipidemia, unspecified: Secondary | ICD-10-CM | POA: Diagnosis not present

## 2020-08-05 DIAGNOSIS — I700 Atherosclerosis of aorta: Secondary | ICD-10-CM | POA: Diagnosis not present

## 2020-08-05 DIAGNOSIS — K219 Gastro-esophageal reflux disease without esophagitis: Secondary | ICD-10-CM | POA: Diagnosis not present

## 2020-08-05 DIAGNOSIS — C9000 Multiple myeloma not having achieved remission: Secondary | ICD-10-CM | POA: Insufficient documentation

## 2020-08-05 DIAGNOSIS — R748 Abnormal levels of other serum enzymes: Secondary | ICD-10-CM | POA: Insufficient documentation

## 2020-08-05 DIAGNOSIS — E119 Type 2 diabetes mellitus without complications: Secondary | ICD-10-CM | POA: Diagnosis not present

## 2020-08-05 DIAGNOSIS — F419 Anxiety disorder, unspecified: Secondary | ICD-10-CM | POA: Diagnosis not present

## 2020-08-05 MED ORDER — TRAMADOL HCL 50 MG PO TABS: 50.0000 mg | ORAL_TABLET | Freq: Two times a day (BID) | ORAL | 0 refills | Status: DC | PRN

## 2020-08-12 ENCOUNTER — Encounter: Payer: Self-pay | Admitting: Oncology

## 2020-08-13 ENCOUNTER — Telehealth: Payer: Self-pay | Admitting: *Deleted

## 2020-08-13 NOTE — Telephone Encounter (Signed)
Call placed to patient to review appointment details for upcoming CT biopsy. Patient is scheduled for CT guided biopsy on 6/21 at 10:00 am, patient to arrive at 9:00 am. Patient verbalized understanding of all upcoming appointment details. Will send scheduling message for patient to f/u week of 6/28 with Dr. Grayland Ormond for results.

## 2020-08-16 ENCOUNTER — Other Ambulatory Visit: Payer: Self-pay | Admitting: Student

## 2020-08-16 MED FILL — BUSPIRONE 30MG TAB: 30 days supply | Qty: 60

## 2020-08-16 NOTE — H&P (Signed)
Chief Complaint: Patient was seen in consultation today for bone lesion biopsy   Referring Physician(s): Finnegan,Timothy J  Supervising Physician: Corrie Mckusick  Patient Status: [facility] - Out-pt  History of Present Illness: Hannah Mcdonald is a 65 y.o. female with a medical history significant for anxiety, DM and squamous cell carcinoma of the skin - left chest with superficial infiltration (2015). She was recently on a cruise and had a fall. She had hip/back pain which led to a CT scan at [facility] and this revealed an incidental lytic bone lesion in her pelvis. Upon returning home she was referred to oncology who ordered lab work up and additional imaging.   CT chest/abdomen/pelvis with contrast 08/03/20 Musculoskeletal: Expansile lytic lesion and associated pathologic fracture in the superior aspect of the right iliac wing. Soft tissue component measures 2.5 x 2.9 cm (2/82). An additional expansile lytic lesion in the L3 vertebral body (2/70. IMPRESSION: 1. Expansile lytic lesions in the L3 vertebral body and right iliac wing. Associated pathologic fracture in the right iliac wing. No additional evidence of primary or metastatic disease in the chest, abdomen or pelvis. 2. Steatotic enlarged liver. 3. Tiny left renal stone. 4.  Aortic atherosclerosis (ICD10-I70.0).  Lab work up was negative for tumor markers but lambda chains are elevated; possible plasmacytoma is on the differential.   Interventional Radiology has been asked to evaluate this patient for an image-guided bone lesion biopsy for further work up. This case was reviewed and procedure approved by Dr. Laurence Ferrari.   Past Medical History:  Diagnosis Date   Anxiety    Diabetes mellitus without complication ([place]) 9629   GERD (gastroesophageal reflux disease)    Hyperlipidemia    Personal history of colonic polyps    Squamous cell carcinoma of skin 11/25/2013   Left chest. WD SCC with superficial infiltration.    Tachycardia     Past Surgical History:  Procedure Laterality Date   AUGMENTATION MAMMAPLASTY Bilateral 5284   silicone   [place], 1989   COLONOSCOPY  2008, 2013   COLONOSCOPY WITH PROPOFOL N/A 08/16/2016   Procedure: COLONOSCOPY WITH PROPOFOL;  Surgeon: Robert Bellow, MD;  Location: [facility] ENDOSCOPY;  Service: Endoscopy;  Laterality: N/A;   DILATION AND CURETTAGE OF UTERUS     ENDOMETRIAL ABLATION  12/1991   ganglion cyst removal      TONSILLECTOMY     WRIST SURGERY Right 05/1995    Allergies: Patient has no known allergies.  Medications: Prior to Admission medications   Medication Sig Start Date End Date Taking? Authorizing Provider  atorvastatin (LIPITOR) 40 MG tablet Take 40 mg by mouth daily.    [provider]  busPIRone (BUSPAR) 30 MG tablet Take 30 mg by mouth 2 (two) times daily.    [provider]  Calcium Carbonate (CALCIUM 600 PO) Take 1 tablet by mouth 2 (two) times daily.    [provider]  cyclobenzaprine (FLEXERIL) 10 MG tablet Take 10 mg by mouth at bedtime.    [provider]  liraglutide (VICTOZA) 18 MG/3ML SOPN Inject 1.2 mg into the skin daily.    [provider]  Magnesium 400 MG TABS Take 1 tablet by mouth daily.    [provider]  meloxicam (MOBIC) 15 MG tablet Take 15 mg by mouth daily.    [provider]  metFORMIN (GLUCOPHAGE) 500 MG tablet Take 500 mg by mouth 2 (two) times daily with a meal.  [provider]  metoprolol succinate (TOPROL-XL) 50 MG 24 hr tablet Take 50 mg by mouth daily. Take with or immediately following a meal.    [provider]  omeprazole (PRILOSEC) 20 MG capsule Take 20 mg by mouth daily.    [provider]  traMADol (ULTRAM) 50 MG tablet Take 1 tablet (50 mg total) by mouth 2 (two) times daily as needed for moderate pain. 08/05/20   Lloyd Huger, MD     Family History  Problem  Relation Age of Onset   Colon polyps Sister    Colon cancer Father 73   Diabetes Mother    Breast cancer Neg Hx     Social History   Socioeconomic History   Marital status: Married    Spouse name: Not on file   Number of children: Not on file   Years of education: Not on file   Highest education level: Not on file  Occupational History   Not on file  Tobacco Use   Smoking status: Former    Packs/day: 1.00    Years: 4.00    Pack years: 4.00    Types: Cigarettes   Smokeless tobacco: Never  Substance and Sexual Activity   Alcohol use: Yes   Drug use: No   Sexual activity: Not on file  Other Topics Concern   Not on file  Social History Narrative   Not on file   Social Determinants of Health   Financial Resource Strain: Not on file  Food Insecurity: Not on file  Transportation Needs: Not on file  Physical Activity: Not on file  Stress: Not on file  Social Connections: Not on file    Review of Systems: A 12 point ROS discussed and pertinent positives are indicated in the HPI above.  All other systems are negative.  Review of Systems  Constitutional:  Negative for appetite change and fatigue.  Respiratory:  Negative for cough and shortness of breath.   Cardiovascular:  Negative for chest pain and leg swelling.  Gastrointestinal:  Negative for abdominal pain, diarrhea, nausea and vomiting.  Musculoskeletal:  Positive for arthralgias and back pain.       Low back; right hip  Neurological:  Negative for dizziness and headaches.   Vital Signs: BP (!) 152/72   Pulse 92   Temp 98.4 F (36.9 C) (Oral)   Resp 20   Ht 5\' 2"  (1.575 m)   Wt 150 lb (68 kg)   SpO2 98%   BMI 27.44 kg/m   Physical Exam Constitutional:      General: She is not in acute distress. HENT:     Mouth/Throat:     Mouth: Mucous membranes are moist.     Pharynx: Oropharynx is clear.  Cardiovascular:     Rate and Rhythm: Normal rate and regular rhythm.     Pulses: Normal pulses.     Heart  sounds: Normal heart sounds.  Pulmonary:     Effort: Pulmonary effort is normal.     Breath sounds: Normal breath sounds.  Abdominal:     General: Bowel sounds are normal.     Palpations: Abdomen is soft.  Musculoskeletal:     Right lower leg: No edema.     Left lower leg: No edema.  Skin:    General: Skin is warm and dry.  Neurological:     Mental Status: She is alert and oriented to person, place, and time.    Imaging: CT CHEST ABDOMEN PELVIS W CONTRAST  Result Date: 08/03/2020 CLINICAL DATA:  Lytic lesion. EXAM: CT CHEST, ABDOMEN, AND PELVIS WITH CONTRAST TECHNIQUE: Multidetector CT imaging of the chest, abdomen and pelvis was performed following the standard protocol during bolus administration of intravenous contrast. CONTRAST:  137mL OMNIPAQUE IOHEXOL 300 MG/ML  SOLN COMPARISON:  None. FINDINGS: CT CHEST FINDINGS Cardiovascular: Heart size normal.  No pericardial effusion. Mediastinum/Nodes: No pathologically enlarged mediastinal, hilar or axillary lymph nodes. Esophagus is grossly unremarkable. Lungs/Pleura: Lungs are clear. No pleural fluid. Airway is unremarkable. Musculoskeletal: No worrisome lytic or sclerotic lesions. Mild degenerative changes in the spine. T4 and T5 superior endplate compression fractures, age indeterminate. CT ABDOMEN PELVIS FINDINGS Hepatobiliary: Liver is decreased in attenuation diffusely and is slightly enlarged, 20.5 cm. Liver and gallbladder are otherwise unremarkable. No biliary ductal dilatation. Pancreas: Negative. Spleen: Negative. Adrenals/Urinary Tract: Adrenal glands are unremarkable. Cortical scarring in the kidneys bilaterally. Tiny stone in the lower pole left kidney. Ureters are decompressed. Bladder is grossly unremarkable. Stomach/Bowel: Stomach, small bowel, appendix and colon are unremarkable. Vascular/Lymphatic: Atherosclerotic calcification of the aorta. Retroaortic left renal vein. No pathologically enlarged lymph nodes. Reproductive: Uterus is  visualized.  No adnexal mass. Other: Bilateral inguinal hernias contain fat. No free fluid. Mesenteries and peritoneum are unremarkable. Musculoskeletal: Expansile lytic lesion and associated pathologic fracture in the superior aspect of the right iliac wing. Soft tissue component measures 2.5 x 2.9 cm (2/82). An additional expansile lytic lesion in the L3 vertebral body (2/70. IMPRESSION: 1. Expansile lytic lesions in the L3 vertebral body and right iliac wing. Associated pathologic fracture in the right iliac wing. No additional evidence of primary or metastatic disease in the chest, abdomen or pelvis. 2. Steatotic enlarged liver. 3. Tiny left renal stone. 4.  Aortic atherosclerosis (ICD10-I70.0). Electronically Signed   By: Lorin Picket M.D.   On: 08/03/2020 12:21   MM 3D SCREEN BREAST W/IMPLANT BILATERAL  Result Date: 07/29/2020 CLINICAL DATA:  Screening. EXAM: DIGITAL SCREENING BILATERAL MAMMOGRAM WITH IMPLANTS, CAD AND TOMOSYNTHESIS TECHNIQUE: Bilateral screening digital craniocaudal and mediolateral oblique mammograms were obtained. Bilateral screening digital breast tomosynthesis was performed. The images were evaluated with computer-aided detection. Standard and/or implant displaced views were performed. COMPARISON:  Previous exam(s). ACR Breast Density Category c: The breast tissue is heterogeneously dense, which may obscure small masses. FINDINGS: The patient has retropectoral implants. There are no findings suspicious for malignancy. IMPRESSION: No mammographic evidence of malignancy. A result letter of this screening mammogram will be mailed directly to the patient. RECOMMENDATION: Screening mammogram in one year. (Code:SM-B-01Y) BI-RADS CATEGORY  1:  Negative. Electronically Signed   By: Claudie Revering M.D.   On: 07/29/2020 12:51    Labs:  CBC: Recent Labs    07/21/20 1017  WBC 5.5  HGB 13.0  HCT 39.7  PLT 268    COAGS: No results for input(s): INR, APTT in the last 8760  hours.  BMP: Recent Labs    07/21/20 1017  NA 139  K 4.3  CL 101  CO2 22  GLUCOSE 130*  BUN 24*  CALCIUM 9.7  CREATININE 0.78  GFRNONAA >60    LIVER FUNCTION TESTS: Recent Labs    07/21/20 1017  BILITOT 0.7  AST 64*  ALT 62*  ALKPHOS 76  PROT 7.5  ALBUMIN 4.9    TUMOR MARKERS: No results for input(s): AFPTM, CEA, CA199, CHROMGRNA in the last 8760 hours.  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  Risks and benefits of a bone lesion biopsy were discussed with the patient and/or patient's family including, but not limited to bleeding, infection, damage to adjacent structures or low yield requiring additional tests.  All of the questions were answered and there is agreement to proceed.  Consent signed and in chart.  Thank you for this interesting consult.  I greatly enjoyed meeting Hannah Mcdonald and look forward to participating in their care.  A copy of this report was sent to the requesting provider on this date.  Electronically Signed: Soyla Dryer, AGACNP-BC [phone] 08/17/2020, 10:29 AM   I spent a total of  30 Minutes   in face to face in clinical consultation, greater than 50% of which was counseling/coordinating care for bone lesion biopsy.

## 2020-08-17 ENCOUNTER — Ambulatory Visit: Admission: RE | Admit: 2020-08-17 | Payer: BC Managed Care – PPO | Source: Ambulatory Visit

## 2020-08-17 ENCOUNTER — Other Ambulatory Visit: Payer: Self-pay

## 2020-08-17 ENCOUNTER — Ambulatory Visit
Admission: RE | Admit: 2020-08-17 | Discharge: 2020-08-17 | Disposition: A | Discharge status: Home / Self Care | Payer: BC Managed Care – PPO | Source: Ambulatory Visit | Attending: Oncology | Admitting: Oncology

## 2020-08-17 DIAGNOSIS — M899 Disorder of bone, unspecified: Secondary | ICD-10-CM | POA: Insufficient documentation

## 2020-08-17 MED ORDER — FENTANYL CITRATE (PF) 100 MCG/2ML IJ SOLN
INTRAMUSCULAR | Status: AC | PRN
  Administered 2020-08-17 (×2): 50 ug via INTRAVENOUS

## 2020-08-17 MED ORDER — FENTANYL CITRATE (PF) 100 MCG/2ML IJ SOLN
INTRAMUSCULAR | Status: AC
  Filled 2020-08-17: qty 2

## 2020-08-17 MED ORDER — SODIUM CHLORIDE 0.9 % IV SOLN: INTRAVENOUS | Status: DC

## 2020-08-17 MED ORDER — MIDAZOLAM HCL 2 MG/2ML IJ SOLN
INTRAMUSCULAR | Status: AC | PRN
  Administered 2020-08-17 (×2): 1 mg via INTRAVENOUS

## 2020-08-17 MED ORDER — MIDAZOLAM HCL 2 MG/2ML IJ SOLN
INTRAMUSCULAR | Status: AC
  Filled 2020-08-17: qty 2

## 2020-08-17 NOTE — Procedures (Signed)
Interventional Radiology Procedure Note  Procedure: US guided biopsy of right iliac bone lesion, possible met Complications: None EBL: None Recommendations: - Bedrest 1 hours.   - Routine wound care - Follow up pathology - Advance diet   Signed,  Corrie Mckusick, DO

## 2020-08-19 ENCOUNTER — Other Ambulatory Visit: Payer: BC Managed Care – PPO

## 2020-08-20 NOTE — Progress Notes (Signed)
Tumor Board Documentation  Hannah Mcdonald was presented by Dr Grayland Ormond at our Tumor Board on 08/19/2020, which included representatives from medical oncology, radiation oncology, surgical, radiology, pathology, navigation, internal medicine, palliative care, research, genetics, pharmacy, pulmonology.  Hannah Mcdonald currently presents as a new patient, for Bee, for new positive pathology with history of the following treatments: surgical intervention(s), active survellience.  Additionally, we reviewed previous medical and familial history, history of present illness, and recent lab results along with all available histopathologic and imaging studies. The tumor board considered available treatment options and made the following recommendations: Additional screening (Bone Marrow Biopsy and PET Scan) Possible treatment based on results of PET adn BM Biopsy are Radiation therapy alone vs Systemic Chempotherapy. Refer to Dr Baruch Gouty  The following procedures/referrals were also placed: No orders of the defined types were placed in this encounter.   Clinical Trial Status: not discussed   Staging used: To be determined AJCC Staging:       Group: Plasma Cell Myeloma   National site-specific guidelines   were discussed with respect to the case.  Tumor board is a meeting of clinicians from various specialty areas who evaluate and discuss patients for whom a multidisciplinary approach is being considered. Final determinations in the plan of care are those of the provider(s). The responsibility for follow up of recommendations given during tumor board is that of the provider.   Today's extended care, comprehensive team conference, Hannah Mcdonald was not present for the discussion and was not examined.   Multidisciplinary Tumor Board is a multidisciplinary case peer review process.  Decisions discussed in the Multidisciplinary Tumor Board reflect the opinions of the specialists present at the conference without  having examined the patient.  Ultimately, treatment and diagnostic decisions rest with the primary provider(s) and the patient.

## 2020-08-21 NOTE — Progress Notes (Signed)
[place]  Telephone:([phone]) [phone] Fax:(336) [phone]  ID: Hannah Mcdonald OB: 1955-11-09  MR#: [id number]  PNT#:[id number]  Hannah Mcdonald Care Team: Albina Billet, MD as PCP - General (Internal Medicine) Bary Castilla Forest Gleason, MD as Consulting Physician (General Surgery)  CHIEF COMPLAINT: Lambda light chain myeloma.  INTERVAL HISTORY: Hannah Mcdonald returns to clinic today to discuss her bone biopsy results and additional diagnostic planning.  She continues to have low back and right hip pain, but otherwise feels well. She has no neurologic complaints.  She denies any recent fevers or illnesses.  She has a good appetite and denies weight loss.  She has no chest pain, shortness of breath, cough, or hemoptysis.  She denies any nausea, vomiting, constipation, or diarrhea.  She has no urinary complaints.  Hannah Mcdonald offers no further specific complaints today.  REVIEW OF SYSTEMS:   Review of Systems  Constitutional: Negative.  Negative for fever, malaise/fatigue and weight loss.  Respiratory: Negative.  Negative for cough, hemoptysis and shortness of breath.   Cardiovascular: Negative.  Negative for chest pain and leg swelling.  Gastrointestinal: Negative.  Negative for abdominal pain.  Genitourinary: Negative.  Negative for dysuria.  Musculoskeletal:  Positive for back pain and joint pain.  Skin: Negative.  Negative for rash.  Neurological: Negative.  Negative for dizziness, focal weakness, weakness and headaches.  Psychiatric/Behavioral: Negative.  The Hannah Mcdonald is not nervous/anxious.    As per HPI. Otherwise, a complete review of systems is negative.  PAST MEDICAL HISTORY: Past Medical History:  Diagnosis Date   Anxiety    Diabetes mellitus without complication ([place]) 0867   GERD (gastroesophageal reflux disease)    Hyperlipidemia    Personal history of colonic polyps    Squamous cell carcinoma of skin 11/25/2013   Left chest. WD SCC with superficial infiltration.   Tachycardia      PAST SURGICAL HISTORY: Past Surgical History:  Procedure Laterality Date   AUGMENTATION MAMMAPLASTY Bilateral 6195   silicone   [place], 1989   COLONOSCOPY  2008, 2013   COLONOSCOPY WITH PROPOFOL N/A 08/16/2016   Procedure: COLONOSCOPY WITH PROPOFOL;  Surgeon: Robert Bellow, MD;  Location: [facility] ENDOSCOPY;  Service: Endoscopy;  Laterality: N/A;   DILATION AND CURETTAGE OF UTERUS     ENDOMETRIAL ABLATION  12/1991   ganglion cyst removal      TONSILLECTOMY     WRIST SURGERY Right 05/1995    FAMILY HISTORY: Family History  Problem Relation Age of Onset   Colon polyps Sister    Colon cancer Father 69   Diabetes Mother    Breast cancer Neg Hx     ADVANCED DIRECTIVES (Y/N):  N  HEALTH MAINTENANCE: Social History   Tobacco Use   Smoking status: Former    Packs/day: 1.00    Years: 4.00    Pack years: 4.00    Types: Cigarettes   Smokeless tobacco: Never  Substance Use Topics   Alcohol use: Yes   Drug use: No     Colonoscopy:  PAP:  Bone density:  Lipid panel:  No Known Allergies  Current Outpatient Medications  Medication Sig Dispense Refill   atorvastatin (LIPITOR) 20 MG tablet Take 20 mg by mouth daily.     busPIRone (BUSPAR) 30 MG tablet Take 30 mg by mouth 2 (two) times daily.     Calcium Carbonate (CALCIUM 600 PO) Take 1 tablet by mouth 2 (two) times daily.     cyclobenzaprine (FLEXERIL)  10 MG tablet Take 10 mg by mouth at bedtime.     Glucosamine 750 MG TABS Take by mouth.     liraglutide (VICTOZA) 18 MG/3ML SOPN Inject 1.2 mg into the skin daily.     Magnesium 400 MG TABS Take 1 tablet by mouth daily.     meloxicam (MOBIC) 15 MG tablet Take 15 mg by mouth daily.     metFORMIN (GLUCOPHAGE) 500 MG tablet Take 1,000 mg by mouth 2 (two) times daily with a meal.     metoprolol succinate (TOPROL-XL) 50 MG 24 hr tablet Take 50 mg by mouth 2 (two) times daily. Take with or immediately following a meal.      omeprazole (PRILOSEC) 20 MG capsule Take 20 mg by mouth daily.     traMADol (ULTRAM) 50 MG tablet Take 1 tablet (50 mg total) by mouth 2 (two) times daily as needed for moderate pain. 60 tablet 0   oxyCODONE-acetaminophen (PERCOCET/ROXICET) 5-325 MG tablet Take 1 tablet by mouth every 4 (four) hours as needed for severe pain. 30 tablet 0   No current facility-administered medications for this visit.    OBJECTIVE: Vitals:   08/24/20 1018 08/24/20 1020  Resp: 20   Temp:  97.8 F (36.6 C)     Body mass index is 27.95 kg/m.    ECOG FS:0 - Asymptomatic  General: Well-developed, well-nourished, no acute distress. Eyes: Pink conjunctiva, anicteric sclera. HEENT: Normocephalic, moist mucous membranes. Lungs: No audible wheezing or coughing. Heart: Regular rate and rhythm. Abdomen: Soft, nontender, no obvious distention. Musculoskeletal: No edema, cyanosis, or clubbing. Neuro: Alert, answering all questions appropriately. Cranial nerves grossly intact. Skin: No rashes or petechiae noted. Psych: Normal affect.   LAB RESULTS:  Lab Results  Component Value Date   NA 139 07/21/2020   K 4.3 07/21/2020   CL 101 07/21/2020   CO2 22 07/21/2020   GLUCOSE 130 (H) 07/21/2020   BUN 24 (H) 07/21/2020   CREATININE 0.78 07/21/2020   CALCIUM 9.7 07/21/2020   PROT 7.5 07/21/2020   ALBUMIN 4.9 07/21/2020   AST 64 (H) 07/21/2020   ALT 62 (H) 07/21/2020   ALKPHOS 76 07/21/2020   BILITOT 0.7 07/21/2020   GFRNONAA >60 07/21/2020    Lab Results  Component Value Date   WBC 5.5 07/21/2020   NEUTROABS 3.0 07/21/2020   HGB 13.0 07/21/2020   HCT 39.7 07/21/2020   MCV 99.7 07/21/2020   PLT 268 07/21/2020     STUDIES: CT CHEST ABDOMEN PELVIS W CONTRAST  Result Date: 08/03/2020 CLINICAL DATA:  Lytic lesion. EXAM: CT CHEST, ABDOMEN, AND PELVIS WITH CONTRAST TECHNIQUE: Multidetector CT imaging of the chest, abdomen and pelvis was performed following the standard protocol during bolus  administration of intravenous contrast. CONTRAST:  131m OMNIPAQUE IOHEXOL 300 MG/ML  SOLN COMPARISON:  None. FINDINGS: CT CHEST FINDINGS Cardiovascular: Heart size normal.  No pericardial effusion. Mediastinum/Nodes: No pathologically enlarged mediastinal, hilar or axillary lymph nodes. Esophagus is grossly unremarkable. Lungs/Pleura: Lungs are clear. No pleural fluid. Airway is unremarkable. Musculoskeletal: No worrisome lytic or sclerotic lesions. Mild degenerative changes in the spine. T4 and T5 superior endplate compression fractures, age indeterminate. CT ABDOMEN PELVIS FINDINGS Hepatobiliary: Liver is decreased in attenuation diffusely and is slightly enlarged, 20.5 cm. Liver and gallbladder are otherwise unremarkable. No biliary ductal dilatation. Pancreas: Negative. Spleen: Negative. Adrenals/Urinary Tract: Adrenal glands are unremarkable. Cortical scarring in the kidneys bilaterally. Tiny stone in the lower pole left kidney. Ureters are decompressed. Bladder is grossly unremarkable. Stomach/Bowel:  Stomach, small bowel, appendix and colon are unremarkable. Vascular/Lymphatic: Atherosclerotic calcification of the aorta. Retroaortic left renal vein. No pathologically enlarged lymph nodes. Reproductive: Uterus is visualized.  No adnexal mass. Other: Bilateral inguinal hernias contain fat. No free fluid. Mesenteries and peritoneum are unremarkable. Musculoskeletal: Expansile lytic lesion and associated pathologic fracture in the superior aspect of the right iliac wing. Soft tissue component measures 2.5 x 2.9 cm (2/82). An additional expansile lytic lesion in the L3 vertebral body (2/70. IMPRESSION: 1. Expansile lytic lesions in the L3 vertebral body and right iliac wing. Associated pathologic fracture in the right iliac wing. No additional evidence of primary or metastatic disease in the chest, abdomen or pelvis. 2. Steatotic enlarged liver. 3. Tiny left renal stone. 4.  Aortic atherosclerosis (ICD10-I70.0).  Electronically Signed   By: Lorin Picket M.D.   On: 08/03/2020 12:21   CT Biopsy  Result Date: 08/17/2020 INDICATION: 65 year old female with a history of lytic lesion right iliac wing EXAM: CT BIOPSY MEDICATIONS: None. ANESTHESIA/SEDATION: Moderate (conscious) sedation was employed during this procedure. A total of Versed 2.0 mg and Fentanyl 100 mcg was administered intravenously. Moderate Sedation Time: 10 minutes. The Hannah Mcdonald's level of consciousness and vital signs were monitored continuously by radiology nursing throughout the procedure under my direct supervision. FLUOROSCOPY TIME:  CT COMPLICATIONS: None PROCEDURE: Informed written consent was obtained from the Hannah Mcdonald after a thorough discussion of the procedural risks, benefits and alternatives. All questions were addressed. Maximal Sterile Barrier Technique was utilized including caps, mask, sterile gowns, sterile gloves, sterile drape, hand hygiene and skin antiseptic. A timeout was performed prior to the initiation of the procedure. Hannah Mcdonald was position prone position on the CT gantry table. Scout CT of the pelvis was performed for surgical planning purposes. The right posterior pelvis was prepped with Chlorhexidine in a sterile fashion, and a sterile drape was applied covering the operative field. A sterile gown and sterile gloves were used for the procedure. Local anesthesia was provided with 1% Lidocaine. Lytic lesion of the right iliac wing was targeted. The skin and subcutaneous tissues were infiltrated with 1% lidocaine without epinephrine. Introducer needle was advanced with CT guidance to the proximal margin of the soft tissue component of the lesion. We confirmed position of the needle with CT images. Multiple 18 gauge core biopsy were performed. The initial device was nonfunctional after the first biopsy, and a second device was required to get adequate tissue samples. Samples placed into formalin. Manual pressure was used for hemostasis  and a sterile dressing was placed. No complications were encountered no significant blood loss was encountered. Hannah Mcdonald tolerated the procedure well and remained hemodynamically stable throughout. IMPRESSION: Status post CT-guided biopsy of right iliac wing lytic lesion. Signed, Dulcy Fanny. Dellia Nims, RPVI [facility] Radiology Electronically Signed   By: Corrie Mckusick D.O.   On: 08/17/2020 11:58   MM 3D SCREEN BREAST W/IMPLANT BILATERAL  Result Date: 07/29/2020 CLINICAL DATA:  Screening. EXAM: DIGITAL SCREENING BILATERAL MAMMOGRAM WITH IMPLANTS, CAD AND TOMOSYNTHESIS TECHNIQUE: Bilateral screening digital craniocaudal and mediolateral oblique mammograms were obtained. Bilateral screening digital breast tomosynthesis was performed. The images were evaluated with computer-aided detection. Standard and/or implant displaced views were performed. COMPARISON:  Previous exam(s). ACR Breast Density Category c: The breast tissue is heterogeneously dense, which may obscure small masses. FINDINGS: The Hannah Mcdonald has retropectoral implants. There are no findings suspicious for malignancy. IMPRESSION: No mammographic evidence of malignancy. A result letter of this screening mammogram will be mailed  directly to the Hannah Mcdonald. RECOMMENDATION: Screening mammogram in one year. (Code:[id number]-B-01Y) BI-RADS CATEGORY  1:  Negative. Electronically Signed   By: Claudie Revering M.D.   On: 07/29/2020 12:51     ASSESSMENT:  Lambda light chain myeloma.  PLAN:      1.  Lambda light chain myeloma: Diagnosis confirmed from bone biopsy on August 17, 2020.  She also has elevated lambda free light chains of 432.1.  Immunoglobulins and SPEP are normal. CT of the chest, abdomen, pelvis from August 03, 2020 reviewed independently and reported as above confirming expansile lytic lesion in right iliac crest measuring 2.5 x 2.9 cm.  Hannah Mcdonald also has a second lesion on L3 vertebrae.  No other evidence of disease.   Hannah Mcdonald will have PET scan and bone marrow biopsy to complete the staging work-up.  Follow-up after both studies are complete for further evaluation and additional treatment planning if necessary. 2.  Elevated liver enzymes: Mild, monitor.  Liver anatomy appears to be within normal limits on imaging. 3.  Pain: Hannah Mcdonald was given a prescription for her Percocet today.  She also has consultation with radiation oncology for palliative XRT.  I spent a total of 30 minutes reviewing chart data, face-to-face evaluation with the Hannah Mcdonald, counseling and coordination of care as detailed above.    Hannah Mcdonald expressed understanding and was in agreement with this plan. She also understands that She can call clinic at any time with any questions, concerns, or complaints.   Cancer Staging Lambda light chain myeloma ([place]) Staging form: Plasma Cell Myeloma and Plasma Cell Disorders, AJCC 8th Edition - Clinical: No stage assigned - Unsigned  Lloyd Huger, MD   08/25/2020 10:04 AM

## 2020-08-23 LAB — SURGICAL PATHOLOGY

## 2020-08-24 ENCOUNTER — Ambulatory Visit
Admission: RE | Admit: 2020-08-24 | Discharge: 2020-08-24 | Disposition: A | Discharge status: Home / Self Care | Payer: BC Managed Care – PPO | Source: Ambulatory Visit | Attending: Radiation Oncology | Admitting: Radiation Oncology

## 2020-08-24 ENCOUNTER — Encounter: Payer: Self-pay | Admitting: Oncology

## 2020-08-24 ENCOUNTER — Other Ambulatory Visit: Payer: Self-pay

## 2020-08-24 ENCOUNTER — Other Ambulatory Visit: Payer: Self-pay | Admitting: *Deleted

## 2020-08-24 ENCOUNTER — Inpatient Hospital Stay (HOSPITAL_BASED_OUTPATIENT_CLINIC_OR_DEPARTMENT_OTHER): Payer: BC Managed Care – PPO | Admitting: Oncology

## 2020-08-24 ENCOUNTER — Telehealth: Payer: Self-pay | Admitting: *Deleted

## 2020-08-24 DIAGNOSIS — C9000 Multiple myeloma not having achieved remission: Secondary | ICD-10-CM

## 2020-08-24 DIAGNOSIS — Z800 Family history of malignant neoplasm of digestive organs: Secondary | ICD-10-CM | POA: Insufficient documentation

## 2020-08-24 DIAGNOSIS — Z85828 Personal history of other malignant neoplasm of skin: Secondary | ICD-10-CM | POA: Diagnosis not present

## 2020-08-24 DIAGNOSIS — E119 Type 2 diabetes mellitus without complications: Secondary | ICD-10-CM | POA: Insufficient documentation

## 2020-08-24 DIAGNOSIS — E785 Hyperlipidemia, unspecified: Secondary | ICD-10-CM | POA: Insufficient documentation

## 2020-08-24 DIAGNOSIS — Z791 Long term (current) use of non-steroidal anti-inflammatories (NSAID): Secondary | ICD-10-CM | POA: Insufficient documentation

## 2020-08-24 DIAGNOSIS — F17210 Nicotine dependence, cigarettes, uncomplicated: Secondary | ICD-10-CM | POA: Insufficient documentation

## 2020-08-24 DIAGNOSIS — Z7984 Long term (current) use of oral hypoglycemic drugs: Secondary | ICD-10-CM | POA: Diagnosis not present

## 2020-08-24 DIAGNOSIS — Z79899 Other long term (current) drug therapy: Secondary | ICD-10-CM | POA: Diagnosis not present

## 2020-08-24 DIAGNOSIS — R000 Tachycardia, unspecified: Secondary | ICD-10-CM | POA: Insufficient documentation

## 2020-08-24 DIAGNOSIS — K219 Gastro-esophageal reflux disease without esophagitis: Secondary | ICD-10-CM | POA: Diagnosis not present

## 2020-08-24 DIAGNOSIS — Z86010 Personal history of colonic polyps: Secondary | ICD-10-CM | POA: Insufficient documentation

## 2020-08-24 DIAGNOSIS — C9030 Solitary plasmacytoma not having achieved remission: Secondary | ICD-10-CM | POA: Diagnosis not present

## 2020-08-24 MED ORDER — OXYCODONE-ACETAMINOPHEN 5-325 MG PO TABS: 1.0000 | ORAL_TABLET | ORAL | 0 refills | Status: DC | PRN

## 2020-08-24 NOTE — Progress Notes (Signed)
Patient here today for follow up, biopsy results.

## 2020-08-24 NOTE — Telephone Encounter (Signed)
Left vm for patient to return call regarding upcoming scheduling of bone marrow biopsy.   Patient is scheduled for bone marrow biopsy on Thursday 6/30 at 8:30 for 7:30 arrival. Scheduling message has been sent for patient to return to see Woodfin Ganja for results week of 7/19.

## 2020-08-24 NOTE — Consult Note (Signed)
NEW PATIENT EVALUATION  Name: Hannah Mcdonald  MRN: [id number]  Date:   08/24/2020     DOB: June 14, 1955   This 65 y.o. 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   DIAGNOSIS: There were no encounter diagnoses.   PREVIOUS INVESTIGATIONS:  CT scans reviewed PET CT scan ordered Pathology report reviewed Clinical notes reviewed Case presented at weekly tumor conference  HPI: Patient is a 65 year old female who was on a cruise had a fall which caused back pain.  Work-up in [place] showed a lytic lesion in the right iliac wing.  There is also an expansile lytic lesion in the L3 vertebral body.  CT-guided biopsy of the right iliac wing lesion was positive for a plasma cell neoplasm with the morphologic findings in conjunction with the pattern of staining on special stains consistent with a malignant malignant plasma cell neoplasm.  Patient is scheduled for bone marrow biopsy as well as a PET CT scan.  She was presented at our weekly tumor conference and recommendation was for radiation therapy at this point.  She is seen today she is doing fairly well she still has some stiffness in her back.  She states she is currently on some narcotic analgesics for pain and if she is standing still she is not in pain.  She is having no focal neurologic deficits.  Her lambda free light chain is elevated432.  PLANNED TREATMENT REGIMEN: Radiation therapy with curative intent for plasma cell tunnel cytoma's of both the L3 lesion as well as right iliac wing  PAST MEDICAL HISTORY:  has a past medical history of Anxiety, Diabetes mellitus without complication ([place]) (3299), GERD (gastroesophageal reflux disease), Hyperlipidemia, Personal history of colonic polyps, Squamous cell carcinoma of skin (11/25/2013), and Tachycardia.    PAST SURGICAL HISTORY:   Past Surgical History:  Procedure Laterality Date   AUGMENTATION MAMMAPLASTY Bilateral 2426   silicone   [place], 1989   COLONOSCOPY  2008, 2013   COLONOSCOPY WITH PROPOFOL N/A 08/16/2016   Procedure: COLONOSCOPY WITH PROPOFOL;  Surgeon: Robert Bellow, MD;  Location: [facility] ENDOSCOPY;  Service: Endoscopy;  Laterality: N/A;   DILATION AND CURETTAGE OF UTERUS     ENDOMETRIAL ABLATION  12/1991   ganglion cyst removal      TONSILLECTOMY     WRIST SURGERY Right 05/1995    FAMILY HISTORY: family history includes Colon cancer (age of onset: 64) in her father; Colon polyps in her sister; Diabetes in her mother.  SOCIAL HISTORY:  reports that she has quit smoking. She has a 4.00 pack-year smoking history. She has never used smokeless tobacco. She reports current alcohol use. She reports that she does not use drugs.  ALLERGIES: Patient has no known allergies.  MEDICATIONS:  Current Outpatient Medications  Medication Sig Dispense Refill   atorvastatin (LIPITOR) 20 MG tablet Take 20 mg by mouth daily.     busPIRone (BUSPAR) 30 MG tablet Take 30 mg by mouth 2 (two) times daily.     Calcium Carbonate (CALCIUM 600 PO) Take 1 tablet by mouth 2 (two) times daily.     cyclobenzaprine (FLEXERIL) 10 MG tablet Take 10 mg by mouth at bedtime.     Glucosamine 750 MG TABS Take by mouth.     liraglutide (VICTOZA) 18 MG/3ML SOPN Inject  1.2 mg into the skin daily.     Magnesium 400 MG TABS Take 1 tablet by mouth daily.     meloxicam (MOBIC) 15 MG tablet Take 15 mg by mouth daily.     metFORMIN (GLUCOPHAGE) 500 MG tablet Take 1,000 mg by mouth 2 (two) times daily with a meal.     metoprolol succinate (TOPROL-XL) 50 MG 24 hr tablet Take 50 mg by mouth 2 (two) times daily. Take with or immediately following a meal.     omeprazole (PRILOSEC) 20 MG capsule Take 20 mg by mouth daily.     traMADol (ULTRAM) 50 MG tablet Take 1 tablet (50 mg total) by mouth 2  (two) times daily as needed for moderate pain. 60 tablet 0   No current facility-administered medications for this encounter.    ECOG PERFORMANCE STATUS:  1 - Symptomatic but completely ambulatory  REVIEW OF SYSTEMS: Patient denies any weight loss, fatigue, weakness, fever, chills or night sweats. Patient denies any loss of vision, blurred vision. Patient denies any ringing  of the ears or hearing loss. No irregular heartbeat. Patient denies heart murmur or history of fainting. Patient denies any chest pain or pain radiating to her upper extremities. Patient denies any shortness of breath, difficulty breathing at night, cough or hemoptysis. Patient denies any swelling in the lower legs. Patient denies any nausea vomiting, vomiting of blood, or coffee ground material in the vomitus. Patient denies any stomach pain. Patient states has had normal bowel movements no significant constipation or diarrhea. Patient denies any dysuria, hematuria or significant nocturia. Patient denies any problems walking, swelling in the joints or loss of balance. Patient denies any skin changes, loss of hair or loss of weight. Patient denies any excessive worrying or anxiety or significant depression. Patient denies any problems with insomnia. Patient denies excessive thirst, polyuria, polydipsia. Patient denies any swollen glands, patient denies easy bruising or easy bleeding. Patient denies any recent infections, allergies or URI. Patient "s visual fields have not changed significantly in recent time.   PHYSICAL EXAM: There were no vitals taken for this visit. No pain is elicited on deep palpation of her spine motor or sensory and DTR levels are equal and symmetric in the lower extremities.  Range of motion of the lower extremities does not elicit pain.  Well-developed well-nourished patient in NAD. HEENT reveals PERLA, EOMI, discs not visualized.  Oral cavity is clear. No oral mucosal lesions are identified. Neck is clear  without evidence of cervical or supraclavicular adenopathy. Lungs are clear to A&P. Cardiac examination is essentially unremarkable with regular rate and rhythm without murmur rub or thrill. Abdomen is benign with no organomegaly or masses noted. Motor sensory and DTR levels are equal and symmetric in the upper and lower extremities. Cranial nerves II through XII are grossly intact. Proprioception is intact. No peripheral adenopathy or edema is identified. No motor or sensory levels are noted. Crude visual fields are within normal range.  LABORATORY DATA: Pathology report reviewed    RADIOLOGY RESULTS: CT scan reviewed PET CT scan ordered   IMPRESSION: 2 solitary plasmacytomas of both the L3 and right iliac wing at this point and staging and 65 year old female  PLAN: This time elect to go with curative doses up to [place] to both lesions simultaneously.  Patient also will be continued be evaluated with bone marrow biopsy as well as PET CT scan work-up for myeloma.  Risks and benefits of treatment including possible diarrhea fatigue alteration of blood counts  skin reaction all were discussed in detail with the patient and her husband.  Both seem to comprehend my treatment plan well.  I have scheduled her for CT simulation tomorrow.  I would like to take this opportunity to thank you for allowing me to participate in the care of your patient.Noreene Filbert, MD

## 2020-08-25 ENCOUNTER — Other Ambulatory Visit: Payer: Self-pay | Admitting: Student

## 2020-08-25 ENCOUNTER — Other Ambulatory Visit: Payer: Self-pay | Admitting: Radiology

## 2020-08-25 ENCOUNTER — Ambulatory Visit
Admission: RE | Admit: 2020-08-25 | Discharge: 2020-08-25 | Disposition: A | Discharge status: Home / Self Care | Payer: BC Managed Care – PPO | Source: Ambulatory Visit | Attending: Radiation Oncology | Admitting: Radiation Oncology

## 2020-08-25 DIAGNOSIS — C9030 Solitary plasmacytoma not having achieved remission: Secondary | ICD-10-CM | POA: Insufficient documentation

## 2020-08-26 ENCOUNTER — Ambulatory Visit
Admission: RE | Admit: 2020-08-26 | Discharge: 2020-08-26 | Disposition: A | Discharge status: Home / Self Care | Payer: BC Managed Care – PPO | Source: Ambulatory Visit | Attending: Oncology | Admitting: Oncology

## 2020-08-26 ENCOUNTER — Other Ambulatory Visit: Payer: Self-pay

## 2020-08-26 DIAGNOSIS — C9000 Multiple myeloma not having achieved remission: Secondary | ICD-10-CM | POA: Insufficient documentation

## 2020-08-26 LAB — CBC WITH DIFFERENTIAL/PLATELET
Abs Immature Granulocytes: 0.02 10*3/uL (ref 0.00–0.07)
Basophils Absolute: 0 10*3/uL (ref 0.0–0.1)
Basophils Relative: 0 %
Eosinophils Absolute: 0.2 10*3/uL (ref 0.0–0.5)
Eosinophils Relative: 4 %
HCT: 39.2 % (ref 36.0–46.0)
Hemoglobin: 12.8 g/dL (ref 12.0–15.0)
Immature Granulocytes: 0 %
Lymphocytes Relative: 43 %
Lymphs Abs: 2.5 10*3/uL (ref 0.7–4.0)
MCH: 32.7 pg (ref 26.0–34.0)
MCHC: 32.7 g/dL (ref 30.0–36.0)
MCV: 100.3 fL — ABNORMAL HIGH (ref 80.0–100.0)
Monocytes Absolute: 0.5 10*3/uL (ref 0.1–1.0)
Monocytes Relative: 8 %
Neutro Abs: 2.7 10*3/uL (ref 1.7–7.7)
Neutrophils Relative %: 45 %
Platelets: 189 10*3/uL (ref 150–400)
RBC: 3.91 MIL/uL (ref 3.87–5.11)
RDW: 12.8 % (ref 11.5–15.5)
WBC: 5.9 10*3/uL (ref 4.0–10.5)
nRBC: 0 % (ref 0.0–0.2)

## 2020-08-26 LAB — GLUCOSE, CAPILLARY: Glucose-Capillary: 175 mg/dL — ABNORMAL HIGH (ref 70–99)

## 2020-08-26 MED ORDER — FENTANYL CITRATE (PF) 100 MCG/2ML IJ SOLN
INTRAMUSCULAR | Status: AC | PRN
  Administered 2020-08-26 (×2): 50 ug via INTRAVENOUS

## 2020-08-26 MED ORDER — MIDAZOLAM HCL 5 MG/5ML IJ SOLN
INTRAMUSCULAR | Status: AC
  Filled 2020-08-26: qty 5

## 2020-08-26 MED ORDER — SODIUM CHLORIDE 0.9 % IV SOLN: INTRAVENOUS | Status: DC

## 2020-08-26 MED ORDER — FENTANYL CITRATE (PF) 100 MCG/2ML IJ SOLN
INTRAMUSCULAR | Status: AC
  Filled 2020-08-26: qty 2

## 2020-08-26 MED ORDER — MIDAZOLAM HCL 5 MG/5ML IJ SOLN
INTRAMUSCULAR | Status: AC | PRN
  Administered 2020-08-26 (×2): 1 mg via INTRAVENOUS

## 2020-08-26 MED ORDER — HEPARIN SOD (PORK) LOCK FLUSH 100 UNIT/ML IV SOLN
INTRAVENOUS | Status: AC
  Filled 2020-08-26: qty 5

## 2020-08-26 NOTE — H&P (Signed)
Interventional Radiology H&P Update Note  Patient known to our service from a right iliac bone lesion biopsy 9 days ago. Full H&P done at that time. No significant pain after that procedure. Here now for bone marrow biopsy after lesion demonstrated plasma cell neoplasm for further work up of multiple myeloma.  Some back pain with ambulation. No other symptoms.  VSSA Lungs: CTAB Heart: RRR. No murmur Abd: Soft/NT/ND Ext: No edema  Plan: Bone marrow aspiration and core biopsy today under CT guidance. Risks and benefits of bone marrow biopsy was discussed with the patient and/or patient's family including, but not limited to bleeding, infection, damage to adjacent structures or low yield requiring additional tests. All of the questions were answered and there is agreement to proceed. Consent signed and in chart.    Venetia Night. Kathlene Cote, M.D Pager:  [phone]

## 2020-08-26 NOTE — Procedures (Signed)
Interventional Radiology Procedure Note  Procedure: CT guided bone marrow aspiration and biopsy  Complications: None  EBL: < 10 mL  Findings: Aspirate and core biopsy performed of bone marrow in left iliac bone.  Plan: Bedrest supine x 1 hrs  Deryck Hippler T. Trenton Passow, M.D Pager:  [phone]    

## 2020-08-26 NOTE — Sedation Documentation (Signed)
Pt. Tolerated BMB well; no complications noted at present. Stable for tx to Specials recovery.

## 2020-08-31 LAB — SURGICAL PATHOLOGY

## 2020-09-02 ENCOUNTER — Other Ambulatory Visit: Payer: Self-pay | Admitting: *Deleted

## 2020-09-02 DIAGNOSIS — C9030 Solitary plasmacytoma not having achieved remission: Secondary | ICD-10-CM | POA: Diagnosis not present

## 2020-09-02 DIAGNOSIS — C9000 Multiple myeloma not having achieved remission: Secondary | ICD-10-CM

## 2020-09-06 ENCOUNTER — Encounter (HOSPITAL_COMMUNITY): Payer: Self-pay | Admitting: Oncology

## 2020-09-06 ENCOUNTER — Ambulatory Visit: Admission: RE | Admit: 2020-09-06 | Payer: BC Managed Care – PPO | Source: Ambulatory Visit

## 2020-09-06 ENCOUNTER — Ambulatory Visit: Payer: BC Managed Care – PPO

## 2020-09-06 ENCOUNTER — Other Ambulatory Visit: Payer: Self-pay

## 2020-09-06 ENCOUNTER — Ambulatory Visit
Admission: RE | Admit: 2020-09-06 | Discharge: 2020-09-06 | Disposition: A | Discharge status: Home / Self Care | Payer: BC Managed Care – PPO | Source: Ambulatory Visit | Attending: Oncology | Admitting: Oncology

## 2020-09-06 DIAGNOSIS — C9030 Solitary plasmacytoma not having achieved remission: Secondary | ICD-10-CM | POA: Diagnosis not present

## 2020-09-06 DIAGNOSIS — K760 Fatty (change of) liver, not elsewhere classified: Secondary | ICD-10-CM | POA: Insufficient documentation

## 2020-09-06 DIAGNOSIS — C9000 Multiple myeloma not having achieved remission: Secondary | ICD-10-CM | POA: Insufficient documentation

## 2020-09-06 LAB — GLUCOSE, CAPILLARY: Glucose-Capillary: 119 mg/dL — ABNORMAL HIGH (ref 70–99)

## 2020-09-06 MED ORDER — FLUDEOXYGLUCOSE F - 18 (FDG) INJECTION
7.7700 | Freq: Once | INTRAVENOUS | Status: AC | PRN
  Administered 2020-09-06: 7.77 via INTRAVENOUS

## 2020-09-07 ENCOUNTER — Inpatient Hospital Stay: Payer: BC Managed Care – PPO | Attending: Oncology

## 2020-09-07 ENCOUNTER — Ambulatory Visit
Admission: RE | Admit: 2020-09-07 | Discharge: 2020-09-07 | Disposition: A | Discharge status: Home / Self Care | Payer: BC Managed Care – PPO | Source: Ambulatory Visit | Attending: Radiation Oncology | Admitting: Radiation Oncology

## 2020-09-07 DIAGNOSIS — Z79899 Other long term (current) drug therapy: Secondary | ICD-10-CM | POA: Diagnosis not present

## 2020-09-07 DIAGNOSIS — R748 Abnormal levels of other serum enzymes: Secondary | ICD-10-CM | POA: Diagnosis not present

## 2020-09-07 DIAGNOSIS — C9000 Multiple myeloma not having achieved remission: Secondary | ICD-10-CM

## 2020-09-07 DIAGNOSIS — C9030 Solitary plasmacytoma not having achieved remission: Secondary | ICD-10-CM | POA: Diagnosis not present

## 2020-09-07 LAB — CBC
HCT: 39.6 % (ref 36.0–46.0)
Hemoglobin: 13.2 g/dL (ref 12.0–15.0)
MCH: 33.4 pg (ref 26.0–34.0)
MCHC: 33.3 g/dL (ref 30.0–36.0)
MCV: 100.3 fL — ABNORMAL HIGH (ref 80.0–100.0)
Platelets: 231 10*3/uL (ref 150–400)
RBC: 3.95 MIL/uL (ref 3.87–5.11)
RDW: 13 % (ref 11.5–15.5)
WBC: 4.5 10*3/uL (ref 4.0–10.5)
nRBC: 0 % (ref 0.0–0.2)

## 2020-09-07 LAB — LACTATE DEHYDROGENASE: LDH: 172 U/L (ref 98–192)

## 2020-09-08 ENCOUNTER — Ambulatory Visit
Admission: RE | Admit: 2020-09-08 | Discharge: 2020-09-08 | Disposition: A | Discharge status: Home / Self Care | Payer: BC Managed Care – PPO | Source: Ambulatory Visit | Attending: Radiation Oncology | Admitting: Radiation Oncology

## 2020-09-08 DIAGNOSIS — C9030 Solitary plasmacytoma not having achieved remission: Secondary | ICD-10-CM | POA: Diagnosis not present

## 2020-09-08 LAB — BETA 2 MICROGLOBULIN, SERUM: Beta-2 Microglobulin: 2.5 mg/L — ABNORMAL HIGH (ref 0.6–2.4)

## 2020-09-09 ENCOUNTER — Ambulatory Visit
Admission: RE | Admit: 2020-09-09 | Discharge: 2020-09-09 | Disposition: A | Discharge status: Home / Self Care | Payer: BC Managed Care – PPO | Source: Ambulatory Visit | Attending: Radiation Oncology | Admitting: Radiation Oncology

## 2020-09-09 DIAGNOSIS — C9030 Solitary plasmacytoma not having achieved remission: Secondary | ICD-10-CM | POA: Diagnosis not present

## 2020-09-09 MED FILL — BUSPIRONE 30MG TAB: 30 days supply | Qty: 60

## 2020-09-09 MED FILL — ATORVASTATIN 20MG TAB: 30 days supply | Qty: 30

## 2020-09-09 MED FILL — OMEPRAZOL RX 20MG CAP: 30 days supply | Qty: 30

## 2020-09-10 ENCOUNTER — Ambulatory Visit
Admission: RE | Admit: 2020-09-10 | Discharge: 2020-09-10 | Disposition: A | Discharge status: Home / Self Care | Payer: BC Managed Care – PPO | Source: Ambulatory Visit | Attending: Radiation Oncology | Admitting: Radiation Oncology

## 2020-09-10 DIAGNOSIS — C9030 Solitary plasmacytoma not having achieved remission: Secondary | ICD-10-CM | POA: Diagnosis not present

## 2020-09-10 MED FILL — CYCLOBENZAPR 10MG TAB: 10 days supply | Qty: 30

## 2020-09-10 MED FILL — METFORMIN 500MG TAB: 30 days supply | Qty: 120

## 2020-09-10 MED FILL — VICTOZA 3'S 18MG/3ML PEN: 30 days supply | Qty: 6

## 2020-09-10 MED FILL — MELOXICAM 15MG TAB: 30 days supply | Qty: 30

## 2020-09-10 MED FILL — METOPROL SUC 50MG ER TAB: 30 days supply | Qty: 60

## 2020-09-13 ENCOUNTER — Ambulatory Visit
Admission: RE | Admit: 2020-09-13 | Discharge: 2020-09-13 | Disposition: A | Discharge status: Home / Self Care | Payer: BC Managed Care – PPO | Source: Ambulatory Visit | Attending: Radiation Oncology | Admitting: Radiation Oncology

## 2020-09-13 DIAGNOSIS — C9030 Solitary plasmacytoma not having achieved remission: Secondary | ICD-10-CM | POA: Diagnosis not present

## 2020-09-14 ENCOUNTER — Ambulatory Visit
Admission: RE | Admit: 2020-09-14 | Discharge: 2020-09-14 | Disposition: A | Discharge status: Home / Self Care | Payer: BC Managed Care – PPO | Source: Ambulatory Visit | Attending: Radiation Oncology | Admitting: Radiation Oncology

## 2020-09-14 DIAGNOSIS — C9030 Solitary plasmacytoma not having achieved remission: Secondary | ICD-10-CM | POA: Diagnosis not present

## 2020-09-14 NOTE — Progress Notes (Signed)
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  INTERVAL HISTORY: Patient returns to clinic today for further evaluation, discussion of her PET scan and bone marrow biopsy results, as well as treatment planning.  Her pain has significantly improved although she admits to a different musculoskeletal pain in her right hip/leg.  She otherwise feels well. She has no neurologic complaints.  She denies any recent fevers or illnesses.  She has a good appetite and denies weight loss.  She has no chest pain, shortness of breath, cough, or hemoptysis.  She denies any nausea, vomiting, constipation, or diarrhea.  She has no urinary complaints.  Patient offers no further specific complaints today.  REVIEW OF SYSTEMS:   Review of Systems  Constitutional: Negative.  Negative for fever, malaise/fatigue and weight loss.  Respiratory: Negative.  Negative for cough, hemoptysis and shortness of breath.   Cardiovascular: Negative.  Negative for chest pain and leg swelling.  Gastrointestinal: Negative.  Negative for abdominal pain.  Genitourinary: Negative.  Negative for dysuria.  Musculoskeletal:  Positive for back pain and joint pain.  Skin: Negative.  Negative for rash.  Neurological: Negative.  Negative for dizziness, focal weakness, weakness and headaches.  Psychiatric/Behavioral: Negative.  The patient is not nervous/anxious.    As per HPI. Otherwise, a complete review of systems is negative.  PAST MEDICAL HISTORY: Past Medical History:  Diagnosis Date   Anxiety    Diabetes mellitus without complication ([place]) 5631   GERD (gastroesophageal reflux disease)    Hyperlipidemia    Personal history of colonic  polyps    Squamous cell carcinoma of skin 11/25/2013   Left chest. WD SCC with superficial infiltration.   Tachycardia     PAST SURGICAL HISTORY: Past Surgical History:  Procedure Laterality Date   AUGMENTATION MAMMAPLASTY Bilateral 4970   silicone   [place], 1989   COLONOSCOPY  2008, 2013   COLONOSCOPY WITH PROPOFOL N/A 08/16/2016   Procedure: COLONOSCOPY WITH PROPOFOL;  Surgeon: Robert Bellow, MD;  Location: [facility] ENDOSCOPY;  Service: Endoscopy;  Laterality: N/A;   DILATION AND CURETTAGE OF UTERUS     ENDOMETRIAL ABLATION  12/1991   ganglion cyst removal      TONSILLECTOMY     WRIST SURGERY Right 05/1995    FAMILY HISTORY: Family History  Problem Relation Age of Onset   Colon polyps Sister    Colon cancer Father 25   Diabetes Mother    Breast cancer Neg Hx     ADVANCED DIRECTIVES (Y/N):  N  HEALTH MAINTENANCE: Social History   Tobacco Use   Smoking status: Former    Packs/day: 1.00    Years: 4.00    Pack years: 4.00    Types: Cigarettes   Smokeless tobacco: Never  Substance Use Topics   Alcohol use: Yes   Drug use: No     Colonoscopy:  PAP:  Bone density:  Lipid panel:  No Known Allergies  Current Outpatient Medications  Medication Sig Dispense Refill   atorvastatin (LIPITOR) 20 MG tablet Take 20 mg by mouth daily.     busPIRone (BUSPAR) 30 MG tablet Take 30 mg by mouth 2 (two) times daily.     Calcium  Carbonate (CALCIUM 600 PO) Take 1 tablet by mouth 2 (two) times daily.     cyclobenzaprine (FLEXERIL) 10 MG tablet Take 10 mg by mouth at bedtime.     Glucosamine 750 MG TABS Take by mouth.     liraglutide (VICTOZA) 18 MG/3ML SOPN Inject 1.2 mg into the skin daily.     Magnesium 400 MG TABS Take 1 tablet by mouth daily.     meloxicam (MOBIC) 15 MG tablet Take 15 mg by mouth daily.     metFORMIN (GLUCOPHAGE) 500 MG tablet Take 1,000 mg by mouth 2 (two) times daily with a meal.     metoprolol  succinate (TOPROL-XL) 50 MG 24 hr tablet Take 50 mg by mouth 2 (two) times daily. Take with or immediately following a meal.     omeprazole (PRILOSEC) 20 MG capsule Take 20 mg by mouth daily.     oxyCODONE-acetaminophen (PERCOCET/ROXICET) 5-325 MG tablet Take 1 tablet by mouth every 4 (four) hours as needed for severe pain. 30 tablet 0   traMADol (ULTRAM) 50 MG tablet Take 1 tablet (50 mg total) by mouth 2 (two) times daily as needed for moderate pain. 60 tablet 0   No current facility-administered medications for this visit.    OBJECTIVE: Vitals:   09/16/20 1110  BP: (!) 116/54  Pulse: 84  Resp: 18  Temp: (!) 96 F (35.6 C)     Body mass index is 27.62 kg/m.    ECOG FS:0 - Asymptomatic  General: Well-developed, well-nourished, no acute distress. Eyes: Pink conjunctiva, anicteric sclera. HEENT: Normocephalic, moist mucous membranes. Lungs: No audible wheezing or coughing. Heart: Regular rate and rhythm. Abdomen: Soft, nontender, no obvious distention. Musculoskeletal: No edema, cyanosis, or clubbing. Neuro: Alert, answering all questions appropriately. Cranial nerves grossly intact. Skin: No rashes or petechiae noted. Psych: Normal affect.  LAB RESULTS:  Lab Results  Component Value Date   NA 139 07/21/2020   K 4.3 07/21/2020   CL 101 07/21/2020   CO2 22 07/21/2020   GLUCOSE 130 (H) 07/21/2020   BUN 24 (H) 07/21/2020   CREATININE 0.78 07/21/2020   CALCIUM 9.7 07/21/2020   PROT 7.5 07/21/2020   ALBUMIN 4.9 07/21/2020   AST 64 (H) 07/21/2020   ALT 62 (H) 07/21/2020   ALKPHOS 76 07/21/2020   BILITOT 0.7 07/21/2020   GFRNONAA >60 07/21/2020    Lab Results  Component Value Date   WBC 4.5 09/15/2020   NEUTROABS 2.7 08/26/2020   HGB 13.2 09/15/2020   HCT 40.7 09/15/2020   MCV 99.8 09/15/2020   PLT 206 09/15/2020     STUDIES: NM PET Image Initial (PI) Whole Body  Result Date: 09/08/2020 CLINICAL DATA:  Initial treatment strategy for myeloma. EXAM: NUCLEAR  MEDICINE PET WHOLE BODY TECHNIQUE: 7.8 mCi F-18 FDG was injected intravenously. Full-ring PET imaging was performed from the head to foot after the radiotracer. CT data was obtained and used for attenuation correction and anatomic localization. Fasting blood glucose: 119 mg/dl COMPARISON:  Chest abdomen pelvis CT 08/03/2020 FINDINGS: Mediastinal blood pool activity: SUV max 2.7 HEAD/NECK: No hypermetabolic activity in the scalp. No hypermetabolic cervical lymph nodes. Incidental CT findings: none CHEST: No hypermetabolic mediastinal or hilar nodes. No suspicious pulmonary nodules on the CT scan. Incidental CT findings: Minimal subpleural scarring noted anterior left lung apex. ABDOMEN/PELVIS: No abnormal hypermetabolic activity within the liver, pancreas, adrenal glands, or spleen. No hypermetabolic lymph nodes in the abdomen or pelvis. Diffuse uptake in the colon is physiologic. Incidental CT findings:  Diffuse low attenuation of liver parenchyma is compatible with fatty deposition. Areas of fatty sparing noted in the subcapsular liver along the gallbladder fossa. 2 mm nonobstructing stone noted lower pole left kidney. No adrenal nodule or mass. SKELETON: Hypermetabolism is identified in the region of the right C5-6 lateral mass/facet joint with SUV max = 5.0. Previously characterized 1.2 cm lytic lesion in the L3 vertebral body shows no substantial hypermetabolism with SUV max = 3.1. Known soft tissue mass involving the right iliac crest is progressive since 08/03/2020 measuring 4.8 x 4.4 cm today compared to 2.7 x 2.5 cm on the CT of 08/03/2020. SUV max = 5.7. Incidental CT findings: none EXTREMITIES: No abnormal hypermetabolic activity in the lower extremities. Incidental CT findings: none IMPRESSION: 1. Hypermetabolic lesion identified in the right C5-6 facets with SUV max = 5.0. The known right iliac crest lesion is progressive since 08/03/2020 with SUV max = 5.7 today. 2. 1.2 cm lytic lesion in the L3  vertebral body is stable in the interval with SUV max = 3.1 today. 3. No evidence for hypermetabolic soft tissue disease on today's study. 4. Hepatic steatosis. Electronically Signed   By: Misty Stanley M.D.   On: 09/08/2020 12:55   CT BONE MARROW BIOPSY & ASPIRATION  Result Date: 08/26/2020 CLINICAL DATA:  L3 and right iliac wing bone lesions with recent biopsy of the right iliac bone lesion demonstrating malignant plasma cell neoplasm. Bone marrow biopsy now needed for workup of multiple myeloma. EXAM: CT GUIDED BONE MARROW ASPIRATION AND BIOPSY ANESTHESIA/SEDATION: Versed 2.0 mg IV, Fentanyl 100 mcg IV Total Moderate Sedation Time:   16 minutes. The patient's level of consciousness and physiologic status were continuously monitored during the procedure by Radiology nursing. PROCEDURE: The procedure risks, benefits, and alternatives were explained to the patient. Questions regarding the procedure were encouraged and answered. The patient understands and consents to the procedure. A time out was performed prior to initiating the procedure. The left gluteal region was prepped with chlorhexidine. Sterile gown and sterile gloves were used for the procedure. Local anesthesia was provided with 1% Lidocaine. Under CT guidance, an 11 gauge On Control bone cutting needle was advanced from a posterior approach into the left iliac bone. Needle positioning was confirmed with CT. Initial non heparinized and heparinized aspirate samples were obtained of bone marrow. Core biopsy was performed via the On Control drill needle. COMPLICATIONS: None FINDINGS: Inspection of initial aspirate did reveal visible particles. Intact core biopsy sample was obtained. IMPRESSION: CT guided bone marrow biopsy of left posterior iliac bone with both aspirate and core samples obtained. Electronically Signed   By: Aletta Edouard M.D.   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  PLAN:      1.  Lambda  light chain myeloma with 1p del: Diagnosis confirmed from bone biopsy on August 17, 2020.  She also has elevated lambda free light chains of 432.1.  Immunoglobulins and SPEP are normal.  Bone marrow biopsy completed on August 26, 2020 revealed 25% plasma cells along with the deletion of 1p which is considered poor prognosis.  PET scan results from September 06, 2020 reviewed independently and reported as above with 3 hypermetabolic lesions in right C5-6 facets, right iliac crest lesion, and L3 vertebral lesion.  After lengthy discussion with the patient, she agreed to pursue chemotherapy, likely with daratumumab, Velcade, Revlimid, and dexamethasone followed by autologous bone marrow transplant.  Patient was given a referral  to [facility] bone marrow transplant clinic for further evaluation.  She will follow-up in 2 to 4 weeks after evaluation of transplant team for additional treatment planning.   2.  Elevated liver enzymes: Mild, monitor.  Liver anatomy appears to be within normal limits on imaging. 3.  Pain: Improved with XRT.  Patient has approximately 2 weeks left of treatment.  Continue Percocet as needed.  I spent a total of 30 minutes reviewing chart data, face-to-face evaluation with the patient, counseling and coordination of care as detailed above.     Patient expressed understanding and was in agreement with this plan. She also understands that She can call clinic at any time with any questions, concerns, or complaints.   Cancer Staging Lambda light chain myeloma ([place]) Staging form: Plasma Cell Myeloma and Plasma Cell Disorders, AJCC 8th Edition - Clinical stage from 09/16/2020: RISS Stage I (Beta-2-microglobulin (mg/L): 2.5, Albumin (g/dL): 4.9, ISS: Stage I, High-risk cytogenetics: Absent, LDH: Normal) - Signed by Lloyd Huger, MD on 09/16/2020 Beta 2 microglobulin range (mg/L): Less than 3.5 Albumin range (g/dL): Greater than or equal to 3.5 Cytogenetics: 1p deletion  Lloyd Huger, MD    09/16/2020 2:45 PM

## 2020-09-15 ENCOUNTER — Ambulatory Visit
Admission: RE | Admit: 2020-09-15 | Discharge: 2020-09-15 | Disposition: A | Discharge status: Home / Self Care | Payer: BC Managed Care – PPO | Source: Ambulatory Visit | Attending: Radiation Oncology | Admitting: Radiation Oncology

## 2020-09-15 ENCOUNTER — Inpatient Hospital Stay: Payer: BC Managed Care – PPO

## 2020-09-15 DIAGNOSIS — C9000 Multiple myeloma not having achieved remission: Secondary | ICD-10-CM

## 2020-09-15 DIAGNOSIS — C9030 Solitary plasmacytoma not having achieved remission: Secondary | ICD-10-CM | POA: Diagnosis not present

## 2020-09-15 LAB — CBC
HCT: 40.7 % (ref 36.0–46.0)
Hemoglobin: 13.2 g/dL (ref 12.0–15.0)
MCH: 32.4 pg (ref 26.0–34.0)
MCHC: 32.4 g/dL (ref 30.0–36.0)
MCV: 99.8 fL (ref 80.0–100.0)
Platelets: 206 10*3/uL (ref 150–400)
RBC: 4.08 MIL/uL (ref 3.87–5.11)
RDW: 13 % (ref 11.5–15.5)
WBC: 4.5 10*3/uL (ref 4.0–10.5)
nRBC: 0 % (ref 0.0–0.2)

## 2020-09-16 ENCOUNTER — Inpatient Hospital Stay: Payer: BC Managed Care – PPO | Admitting: Oncology

## 2020-09-16 ENCOUNTER — Encounter: Payer: Self-pay | Admitting: Oncology

## 2020-09-16 ENCOUNTER — Other Ambulatory Visit: Payer: Self-pay

## 2020-09-16 ENCOUNTER — Ambulatory Visit
Admission: RE | Admit: 2020-09-16 | Discharge: 2020-09-16 | Disposition: A | Discharge status: Home / Self Care | Payer: BC Managed Care – PPO | Source: Ambulatory Visit | Attending: Radiation Oncology | Admitting: Radiation Oncology

## 2020-09-16 ENCOUNTER — Telehealth: Payer: Self-pay

## 2020-09-16 VITALS — BP 116/54 | HR 84 | Temp 96.0°F | Resp 18 | Wt 151.0 lb

## 2020-09-16 DIAGNOSIS — C9030 Solitary plasmacytoma not having achieved remission: Secondary | ICD-10-CM | POA: Diagnosis not present

## 2020-09-16 DIAGNOSIS — C9000 Multiple myeloma not having achieved remission: Secondary | ICD-10-CM | POA: Diagnosis not present

## 2020-09-16 NOTE — Progress Notes (Signed)
Pt in for follow up and test results.  Reports pain in right lower abdomen, worsens on exertion.

## 2020-09-16 NOTE — Progress Notes (Signed)
START ON PATHWAY REGIMEN - Multiple Myeloma and Other Plasma Cell Dyscrasias   DaraVRd (Daratumumab SUBQ + Bortezomib SUBQ + Lenalidomide PO + Dexamethasone IV/PO) q21 Days (Induction Schema):   A cycle is every 21 days:     Lenalidomide      Dexamethasone      Bortezomib      Daratumumab and hyaluronidase-fihj   **Always confirm dose/schedule in your pharmacy ordering system**  DaraVRd (Daratumumab SUBQ + Bortezomib SUBQ + Lenalidomide PO + Dexamethasone IV/PO) q21 Days (Consolidation Schema):   A cycle is every 21 days:     Lenalidomide      Dexamethasone      Bortezomib      Daratumumab and hyaluronidase-fihj   **Always confirm dose/schedule in your pharmacy ordering system**  Patient Characteristics: Multiple Myeloma, Newly Diagnosed, Transplant Eligible, Standard Risk Disease Classification: Multiple Myeloma R-ISS Staging: I Therapeutic Status: Newly Diagnosed Is Patient Eligible for Transplant<= Transplant Eligible Risk Status: Standard Risk Intent of Therapy: Curative Intent, Discussed with Patient

## 2020-09-17 ENCOUNTER — Ambulatory Visit
Admission: RE | Admit: 2020-09-17 | Discharge: 2020-09-17 | Disposition: A | Discharge status: Home / Self Care | Payer: BC Managed Care – PPO | Source: Ambulatory Visit | Attending: Radiation Oncology | Admitting: Radiation Oncology

## 2020-09-17 DIAGNOSIS — C9030 Solitary plasmacytoma not having achieved remission: Secondary | ICD-10-CM | POA: Diagnosis not present

## 2020-09-18 MED FILL — CYCLOBENZAPR 10MG TAB: 10 days supply | Qty: 30

## 2020-09-20 ENCOUNTER — Ambulatory Visit
Admission: RE | Admit: 2020-09-20 | Discharge: 2020-09-20 | Disposition: A | Discharge status: Home / Self Care | Payer: BC Managed Care – PPO | Source: Ambulatory Visit | Attending: Radiation Oncology | Admitting: Radiation Oncology

## 2020-09-20 DIAGNOSIS — C9030 Solitary plasmacytoma not having achieved remission: Secondary | ICD-10-CM | POA: Diagnosis not present

## 2020-09-21 ENCOUNTER — Ambulatory Visit
Admission: RE | Admit: 2020-09-21 | Discharge: 2020-09-21 | Disposition: A | Discharge status: Home / Self Care | Payer: BC Managed Care – PPO | Source: Ambulatory Visit | Attending: Radiation Oncology | Admitting: Radiation Oncology

## 2020-09-21 DIAGNOSIS — C9030 Solitary plasmacytoma not having achieved remission: Secondary | ICD-10-CM | POA: Diagnosis not present

## 2020-09-22 ENCOUNTER — Ambulatory Visit
Admission: RE | Admit: 2020-09-22 | Discharge: 2020-09-22 | Disposition: A | Discharge status: Home / Self Care | Payer: BC Managed Care – PPO | Source: Ambulatory Visit | Attending: Radiation Oncology | Admitting: Radiation Oncology

## 2020-09-22 ENCOUNTER — Inpatient Hospital Stay: Payer: BC Managed Care – PPO

## 2020-09-22 ENCOUNTER — Other Ambulatory Visit: Payer: Self-pay

## 2020-09-22 DIAGNOSIS — C9030 Solitary plasmacytoma not having achieved remission: Secondary | ICD-10-CM | POA: Diagnosis not present

## 2020-09-22 DIAGNOSIS — C9000 Multiple myeloma not having achieved remission: Secondary | ICD-10-CM

## 2020-09-22 LAB — CBC
HCT: 40.9 % (ref 36.0–46.0)
Hemoglobin: 13.3 g/dL (ref 12.0–15.0)
MCH: 32.9 pg (ref 26.0–34.0)
MCHC: 32.5 g/dL (ref 30.0–36.0)
MCV: 101.2 fL — ABNORMAL HIGH (ref 80.0–100.0)
Platelets: 177 10*3/uL (ref 150–400)
RBC: 4.04 MIL/uL (ref 3.87–5.11)
RDW: 12.6 % (ref 11.5–15.5)
WBC: 3.5 10*3/uL — ABNORMAL LOW (ref 4.0–10.5)
nRBC: 0 % (ref 0.0–0.2)

## 2020-09-23 ENCOUNTER — Ambulatory Visit
Admission: RE | Admit: 2020-09-23 | Discharge: 2020-09-23 | Disposition: A | Discharge status: Home / Self Care | Payer: BC Managed Care – PPO | Source: Ambulatory Visit | Attending: Radiation Oncology | Admitting: Radiation Oncology

## 2020-09-23 DIAGNOSIS — C9030 Solitary plasmacytoma not having achieved remission: Secondary | ICD-10-CM | POA: Diagnosis not present

## 2020-09-24 ENCOUNTER — Other Ambulatory Visit: Payer: Self-pay | Admitting: *Deleted

## 2020-09-24 ENCOUNTER — Ambulatory Visit
Admission: RE | Admit: 2020-09-24 | Discharge: 2020-09-24 | Disposition: A | Discharge status: Home / Self Care | Payer: BC Managed Care – PPO | Source: Ambulatory Visit | Attending: Radiation Oncology | Admitting: Radiation Oncology

## 2020-09-24 DIAGNOSIS — C9030 Solitary plasmacytoma not having achieved remission: Secondary | ICD-10-CM | POA: Diagnosis not present

## 2020-09-24 DIAGNOSIS — C9000 Multiple myeloma not having achieved remission: Secondary | ICD-10-CM

## 2020-09-27 ENCOUNTER — Ambulatory Visit
Admission: RE | Admit: 2020-09-27 | Discharge: 2020-09-27 | Disposition: A | Discharge status: Home / Self Care | Payer: BC Managed Care – PPO | Source: Ambulatory Visit | Attending: Radiation Oncology | Admitting: Radiation Oncology

## 2020-09-27 ENCOUNTER — Encounter: Payer: Self-pay | Admitting: Oncology

## 2020-09-27 DIAGNOSIS — C9030 Solitary plasmacytoma not having achieved remission: Secondary | ICD-10-CM | POA: Diagnosis present

## 2020-09-27 DIAGNOSIS — Z510 Encounter for antineoplastic radiation therapy: Secondary | ICD-10-CM | POA: Insufficient documentation

## 2020-09-28 ENCOUNTER — Ambulatory Visit
Admission: RE | Admit: 2020-09-28 | Discharge: 2020-09-28 | Disposition: A | Discharge status: Home / Self Care | Payer: BC Managed Care – PPO | Source: Ambulatory Visit | Attending: Radiation Oncology | Admitting: Radiation Oncology

## 2020-09-28 ENCOUNTER — Ambulatory Visit: Payer: BC Managed Care – PPO | Admitting: Dermatology

## 2020-09-28 DIAGNOSIS — C9030 Solitary plasmacytoma not having achieved remission: Secondary | ICD-10-CM | POA: Diagnosis not present

## 2020-09-29 ENCOUNTER — Inpatient Hospital Stay: Payer: BC Managed Care – PPO | Attending: Radiation Oncology

## 2020-09-29 ENCOUNTER — Ambulatory Visit
Admission: RE | Admit: 2020-09-29 | Discharge: 2020-09-29 | Disposition: A | Discharge status: Home / Self Care | Payer: BC Managed Care – PPO | Source: Ambulatory Visit | Attending: Radiation Oncology | Admitting: Radiation Oncology

## 2020-09-29 DIAGNOSIS — Z79899 Other long term (current) drug therapy: Secondary | ICD-10-CM | POA: Insufficient documentation

## 2020-09-29 DIAGNOSIS — R748 Abnormal levels of other serum enzymes: Secondary | ICD-10-CM | POA: Insufficient documentation

## 2020-09-29 DIAGNOSIS — Z923 Personal history of irradiation: Secondary | ICD-10-CM | POA: Diagnosis not present

## 2020-09-29 DIAGNOSIS — C9000 Multiple myeloma not having achieved remission: Secondary | ICD-10-CM | POA: Insufficient documentation

## 2020-09-29 DIAGNOSIS — C9030 Solitary plasmacytoma not having achieved remission: Secondary | ICD-10-CM | POA: Diagnosis not present

## 2020-09-29 LAB — CBC
HCT: 40.3 % (ref 36.0–46.0)
Hemoglobin: 13.1 g/dL (ref 12.0–15.0)
MCH: 32.4 pg (ref 26.0–34.0)
MCHC: 32.5 g/dL (ref 30.0–36.0)
MCV: 99.8 fL (ref 80.0–100.0)
Platelets: 170 10*3/uL (ref 150–400)
RBC: 4.04 MIL/uL (ref 3.87–5.11)
RDW: 12.7 % (ref 11.5–15.5)
WBC: 3.3 10*3/uL — ABNORMAL LOW (ref 4.0–10.5)
nRBC: 0 % (ref 0.0–0.2)

## 2020-09-29 NOTE — Telephone Encounter (Signed)
Referral receipt confirmed and is in review by MD.

## 2020-09-30 ENCOUNTER — Other Ambulatory Visit: Payer: Self-pay | Admitting: Oncology

## 2020-09-30 ENCOUNTER — Ambulatory Visit
Admission: RE | Admit: 2020-09-30 | Discharge: 2020-09-30 | Disposition: A | Discharge status: Home / Self Care | Payer: BC Managed Care – PPO | Source: Ambulatory Visit | Attending: Radiation Oncology | Admitting: Radiation Oncology

## 2020-09-30 DIAGNOSIS — C9030 Solitary plasmacytoma not having achieved remission: Secondary | ICD-10-CM | POA: Diagnosis not present

## 2020-09-30 MED ORDER — OXYCODONE-ACETAMINOPHEN 5-325 MG PO TABS: 1.0000 | ORAL_TABLET | ORAL | 0 refills | Status: DC | PRN

## 2020-10-01 ENCOUNTER — Ambulatory Visit
Admission: RE | Admit: 2020-10-01 | Discharge: 2020-10-01 | Disposition: A | Discharge status: Home / Self Care | Payer: BC Managed Care – PPO | Source: Ambulatory Visit | Attending: Radiation Oncology | Admitting: Radiation Oncology

## 2020-10-01 DIAGNOSIS — C9030 Solitary plasmacytoma not having achieved remission: Secondary | ICD-10-CM | POA: Diagnosis not present

## 2020-10-04 ENCOUNTER — Encounter: Payer: Self-pay | Admitting: Oncology

## 2020-10-04 ENCOUNTER — Ambulatory Visit
Admission: RE | Admit: 2020-10-04 | Discharge: 2020-10-04 | Disposition: A | Discharge status: Home / Self Care | Payer: BC Managed Care – PPO | Source: Ambulatory Visit | Attending: Radiation Oncology | Admitting: Radiation Oncology

## 2020-10-04 DIAGNOSIS — C9030 Solitary plasmacytoma not having achieved remission: Secondary | ICD-10-CM | POA: Diagnosis not present

## 2020-10-05 ENCOUNTER — Ambulatory Visit
Admission: RE | Admit: 2020-10-05 | Discharge: 2020-10-05 | Disposition: A | Discharge status: Home / Self Care | Payer: BC Managed Care – PPO | Source: Ambulatory Visit | Attending: Radiation Oncology | Admitting: Radiation Oncology

## 2020-10-05 DIAGNOSIS — C9030 Solitary plasmacytoma not having achieved remission: Secondary | ICD-10-CM | POA: Diagnosis not present

## 2020-10-06 ENCOUNTER — Ambulatory Visit
Admission: RE | Admit: 2020-10-06 | Discharge: 2020-10-06 | Disposition: A | Discharge status: Home / Self Care | Payer: BC Managed Care – PPO | Source: Ambulatory Visit | Attending: Radiation Oncology | Admitting: Radiation Oncology

## 2020-10-06 DIAGNOSIS — C9030 Solitary plasmacytoma not having achieved remission: Secondary | ICD-10-CM | POA: Diagnosis not present

## 2020-10-06 MED FILL — VICTOZA 3'S 18MG/3ML PEN: 30 days supply | Qty: 6

## 2020-10-06 MED FILL — METFORMIN 500MG TAB: 30 days supply | Qty: 120

## 2020-10-06 MED FILL — BUSPIRONE 30MG TAB: 90 days supply | Qty: 180

## 2020-10-06 MED FILL — OMEPRAZOL RX 20MG CAP: 90 days supply | Qty: 90

## 2020-10-06 MED FILL — MELOXICAM 15MG TAB: 30 days supply | Qty: 30

## 2020-10-06 MED FILL — CYCLOBENZAPR 10MG TAB: 10 days supply | Qty: 30

## 2020-10-06 MED FILL — ATORVASTATIN 20MG TAB: 30 days supply | Qty: 30

## 2020-10-06 MED FILL — METOPROL SUC 50MG ER TAB: 90 days supply | Qty: 180

## 2020-10-07 ENCOUNTER — Ambulatory Visit
Admission: RE | Admit: 2020-10-07 | Discharge: 2020-10-07 | Disposition: A | Discharge status: Home / Self Care | Payer: BC Managed Care – PPO | Source: Ambulatory Visit | Attending: Radiation Oncology | Admitting: Radiation Oncology

## 2020-10-07 DIAGNOSIS — C9030 Solitary plasmacytoma not having achieved remission: Secondary | ICD-10-CM | POA: Diagnosis not present

## 2020-10-13 NOTE — Telephone Encounter (Signed)
Patient is scheduled for initial consult at [facility] on 11/15/2020.

## 2020-10-15 NOTE — Progress Notes (Signed)
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  INTERVAL HISTORY: Patient returns to clinic today for further evaluation and treatment planning.  Hannah Mcdonald states despite recent XRT, her back pain has become significantly worse.  Hannah Mcdonald otherwise feels well.  Hannah Mcdonald has no neurologic complaints.  Hannah Mcdonald denies any recent fevers or illnesses.  Hannah Mcdonald has a good appetite and denies weight loss.  Hannah Mcdonald has no chest pain, shortness of breath, cough, or hemoptysis.  Hannah Mcdonald denies any nausea, vomiting, constipation, or diarrhea.  Hannah Mcdonald has no urinary complaints.  Patient offers no further specific complaints today.    REVIEW OF SYSTEMS:   Review of Systems  Constitutional: Negative.  Negative for fever, malaise/fatigue and weight loss.  Respiratory: Negative.  Negative for cough, hemoptysis and shortness of breath.   Cardiovascular: Negative.  Negative for chest pain and leg swelling.  Gastrointestinal: Negative.  Negative for abdominal pain.  Genitourinary: Negative.  Negative for dysuria.  Musculoskeletal:  Positive for back pain and joint pain.  Skin: Negative.  Negative for rash.  Neurological: Negative.  Negative for dizziness, focal weakness, weakness and headaches.  Psychiatric/Behavioral: Negative.  The patient is not nervous/anxious.    As per HPI. Otherwise, a complete review of systems is negative.  PAST MEDICAL HISTORY: Past Medical History:  Diagnosis Date   Anxiety    Diabetes mellitus without complication ([place]) 3235   GERD (gastroesophageal reflux disease)    Hyperlipidemia    Personal history of colonic polyps    Squamous cell carcinoma of skin 11/25/2013   Left chest. WD SCC with superficial infiltration.    Tachycardia     PAST SURGICAL HISTORY: Past Surgical History:  Procedure Laterality Date   AUGMENTATION MAMMAPLASTY Bilateral 5732   silicone   [place], 1989   COLONOSCOPY  2008, 2013   COLONOSCOPY WITH PROPOFOL N/A 08/16/2016   Procedure: COLONOSCOPY WITH PROPOFOL;  Surgeon: Robert Bellow, MD;  Location: [facility] ENDOSCOPY;  Service: Endoscopy;  Laterality: N/A;   DILATION AND CURETTAGE OF UTERUS     ENDOMETRIAL ABLATION  12/1991   ganglion cyst removal      TONSILLECTOMY     WRIST SURGERY Right 05/1995    FAMILY HISTORY: Family History  Problem Relation Age of Onset   Colon polyps Sister    Colon cancer Father 16   Diabetes Mother    Breast cancer Neg Hx     ADVANCED DIRECTIVES (Y/N):  N  HEALTH MAINTENANCE: Social History   Tobacco Use   Smoking status: Former    Packs/day: 1.00    Years: 4.00    Pack years: 4.00    Types: Cigarettes   Smokeless tobacco: Never  Substance Use Topics   Alcohol use: Yes   Drug use: No     Colonoscopy:  PAP:  Bone density:  Lipid panel:  No Known Allergies  Current Outpatient Medications  Medication Sig Dispense Refill   atorvastatin (LIPITOR) 20 MG tablet Take 20 mg by mouth daily.     busPIRone (BUSPAR) 30 MG tablet Take 30 mg by mouth 2 (two) times daily.     Calcium Carbonate (CALCIUM 600 PO) Take 1 tablet by mouth 2 (two) times daily.  cyclobenzaprine (FLEXERIL) 10 MG tablet Take 10 mg by mouth at bedtime.     Glucosamine 750 MG TABS Take by mouth.     liraglutide (VICTOZA) 18 MG/3ML SOPN Inject 1.2 mg into the skin daily.     meloxicam (MOBIC) 15 MG tablet Take 15 mg by mouth daily.     metFORMIN (GLUCOPHAGE) 500 MG tablet Take 1,000 mg by mouth 2 (two) times daily with a meal.     metoprolol succinate (TOPROL-XL) 50 MG 24 hr tablet Take 50 mg by mouth 2 (two) times daily. Take with or immediately following a meal.     omeprazole (PRILOSEC) 20 MG capsule Take  20 mg by mouth daily.     oxyCODONE-acetaminophen (PERCOCET/ROXICET) 5-325 MG tablet Take 1 tablet by mouth every 4 (four) hours as needed for severe pain. 30 tablet 0   traMADol (ULTRAM) 50 MG tablet Take 1 tablet (50 mg total) by mouth 2 (two) times daily as needed for moderate pain. 60 tablet 0   Magnesium 400 MG TABS Take 1 tablet by mouth daily. (Patient not taking: Reported on 10/19/2020)     No current facility-administered medications for this visit.    OBJECTIVE: Vitals:   10/19/20 1009  BP: 124/79  Pulse: 90  Resp: 16  Temp: 97.6 F (36.4 C)     Body mass index is 27.14 kg/m.    ECOG FS:1 - Symptomatic but completely ambulatory  General: Well-developed, well-nourished, no acute distress. Eyes: Pink conjunctiva, anicteric sclera. HEENT: Normocephalic, moist mucous membranes. Lungs: No audible wheezing or coughing. Heart: Regular rate and rhythm. Abdomen: Soft, nontender, no obvious distention. Musculoskeletal: No edema, cyanosis, or clubbing. Neuro: Alert, answering all questions appropriately. Cranial nerves grossly intact. Skin: No rashes or petechiae noted. Psych: Normal affect.   LAB RESULTS:  Lab Results  Component Value Date   NA 139 07/21/2020   K 4.3 07/21/2020   CL 101 07/21/2020   CO2 22 07/21/2020   GLUCOSE 130 (H) 07/21/2020   BUN 24 (H) 07/21/2020   CREATININE 0.78 07/21/2020   CALCIUM 9.7 07/21/2020   PROT 7.5 07/21/2020   ALBUMIN 4.9 07/21/2020   AST 64 (H) 07/21/2020   ALT 62 (H) 07/21/2020   ALKPHOS 76 07/21/2020   BILITOT 0.7 07/21/2020   GFRNONAA >60 07/21/2020    Lab Results  Component Value Date   WBC 3.6 (L) 10/19/2020   NEUTROABS 2.7 08/26/2020   HGB 12.7 10/19/2020   HCT 38.8 10/19/2020   MCV 100.3 (H) 10/19/2020   PLT 178 10/19/2020     STUDIES: No results found.   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  PLAN:      1.  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  Hannah Mcdonald also has elevated lambda free light chains of 432.1.  Immunoglobulins and SPEP are normal.  Bone marrow biopsy completed on August 26, 2020 revealed 25% plasma cells along with the deletion of 1p which is considered poor prognosis.  PET scan results from September 06, 2020 reviewed independently and reported as above with 3 hypermetabolic lesions in right C5-6 facets, right iliac crest lesion, and L3 vertebral lesion.  After lengthy discussion with the patient, Hannah Mcdonald agreed to pursue chemotherapy with daratumumab, Velcade, Revlimid, and dexamethasone followed by autologous bone marrow transplant.  Patient will receive weekly daratumumab for the first 3-4 cycles along with Velcade on days 1, 4, 8, and 11.  Hannah Mcdonald will receive  Revlimid on days 1 through 14 of a 21-day cycle.  Plan to do 4-6 cycles prior to autologous bone cell transplant at [facility].  Her initial appointment at [facility] is on November 15, 2020.  Return to clinic in 2 weeks for further evaluation and initiation of cycle 1, day 1. 2.  Elevated liver enzymes: Mild, monitor.  Liver anatomy appears to be within normal limits on imaging. 3.  Pain: Patient has now completed XRT, but her pain is worse.  Refill Percocet and tramadol today and patient was also given a prescription for MS Contin.  I spent a total of 30 minutes reviewing chart data, face-to-face evaluation with the patient, counseling and coordination of care as detailed above.    Patient expressed understanding and was in agreement with this plan. Hannah Mcdonald also understands that Hannah Mcdonald can call clinic at any time with any questions, concerns, or complaints.   Cancer Staging Lambda light chain myeloma ([place]) Staging form: Plasma Cell Myeloma and Plasma Cell Disorders, AJCC 8th Edition - Clinical stage from 09/16/2020: RISS Stage I (Beta-2-microglobulin (mg/L): 2.5, Albumin (g/dL): 4.9, ISS: Stage I, High-risk cytogenetics: Absent, LDH: Normal) - Signed by Lloyd Huger, MD on  09/16/2020 Beta 2 microglobulin range (mg/L): Less than 3.5 Albumin range (g/dL): Greater than or equal to 3.5 Cytogenetics: 1p deletion  Lloyd Huger, MD   10/19/2020 1:15 PM

## 2020-10-19 ENCOUNTER — Encounter: Payer: Self-pay | Admitting: Oncology

## 2020-10-19 ENCOUNTER — Other Ambulatory Visit: Payer: Self-pay

## 2020-10-19 ENCOUNTER — Other Ambulatory Visit (HOSPITAL_COMMUNITY): Payer: Self-pay

## 2020-10-19 ENCOUNTER — Inpatient Hospital Stay (HOSPITAL_BASED_OUTPATIENT_CLINIC_OR_DEPARTMENT_OTHER): Payer: BC Managed Care – PPO | Admitting: Oncology

## 2020-10-19 ENCOUNTER — Telehealth: Payer: Self-pay | Admitting: Pharmacist

## 2020-10-19 ENCOUNTER — Ambulatory Visit: Payer: BC Managed Care – PPO

## 2020-10-19 ENCOUNTER — Ambulatory Visit
Admission: RE | Admit: 2020-10-19 | Discharge: 2020-10-19 | Disposition: A | Discharge status: Home / Self Care | Payer: BC Managed Care – PPO | Source: Ambulatory Visit | Attending: Radiation Oncology | Admitting: Radiation Oncology

## 2020-10-19 ENCOUNTER — Inpatient Hospital Stay: Payer: BC Managed Care – PPO

## 2020-10-19 ENCOUNTER — Ambulatory Visit
Admission: RE | Admit: 2020-10-19 | Discharge: 2020-10-19 | Disposition: A | Discharge status: Home / Self Care | Payer: BC Managed Care – PPO | Source: Ambulatory Visit | Attending: Oncology | Admitting: Oncology

## 2020-10-19 ENCOUNTER — Telehealth: Payer: Self-pay | Admitting: Pharmacy Technician

## 2020-10-19 ENCOUNTER — Telehealth: Payer: Self-pay

## 2020-10-19 VITALS — BP 124/79 | HR 90 | Temp 97.6°F | Resp 16 | Wt 148.4 lb

## 2020-10-19 DIAGNOSIS — C9000 Multiple myeloma not having achieved remission: Secondary | ICD-10-CM

## 2020-10-19 DIAGNOSIS — M899 Disorder of bone, unspecified: Secondary | ICD-10-CM | POA: Insufficient documentation

## 2020-10-19 LAB — CBC
HCT: 38.8 % (ref 36.0–46.0)
Hemoglobin: 12.7 g/dL (ref 12.0–15.0)
MCH: 32.8 pg (ref 26.0–34.0)
MCHC: 32.7 g/dL (ref 30.0–36.0)
MCV: 100.3 fL — ABNORMAL HIGH (ref 80.0–100.0)
Platelets: 178 10*3/uL (ref 150–400)
RBC: 3.87 MIL/uL (ref 3.87–5.11)
RDW: 12.9 % (ref 11.5–15.5)
WBC: 3.6 10*3/uL — ABNORMAL LOW (ref 4.0–10.5)
nRBC: 0 % (ref 0.0–0.2)

## 2020-10-19 MED ORDER — ONDANSETRON HCL 8 MG PO TABS: 8.0000 mg | ORAL_TABLET | Freq: Two times a day (BID) | ORAL | 2 refills | Status: AC | PRN

## 2020-10-19 MED ORDER — TRAMADOL HCL 50 MG PO TABS: 50.0000 mg | ORAL_TABLET | Freq: Two times a day (BID) | ORAL | 0 refills | Status: DC | PRN

## 2020-10-19 MED ORDER — MORPHINE SULFATE ER 15 MG PO TBCR: 15.0000 mg | EXTENDED_RELEASE_TABLET | Freq: Two times a day (BID) | ORAL | 0 refills | Status: DC

## 2020-10-19 MED ORDER — ACYCLOVIR 400 MG PO TABS: 400.0000 mg | ORAL_TABLET | Freq: Two times a day (BID) | ORAL | 5 refills | Status: DC

## 2020-10-19 MED ORDER — GADOBUTROL 1 MMOL/ML IV SOLN
6.0000 mL | Freq: Once | INTRAVENOUS | Status: AC | PRN
  Administered 2020-10-19: 6 mL via INTRAVENOUS

## 2020-10-19 MED ORDER — PROCHLORPERAZINE MALEATE 10 MG PO TABS: 10.0000 mg | ORAL_TABLET | Freq: Four times a day (QID) | ORAL | 2 refills | Status: DC | PRN

## 2020-10-19 MED ORDER — OXYCODONE-ACETAMINOPHEN 5-325 MG PO TABS: 1.0000 | ORAL_TABLET | ORAL | 0 refills | Status: DC | PRN

## 2020-10-19 MED ORDER — DEXAMETHASONE 4 MG PO TABS: ORAL_TABLET | ORAL | 5 refills | Status: DC

## 2020-10-19 MED ORDER — LENALIDOMIDE 25 MG PO CAPS: 25.0000 mg | ORAL_CAPSULE | Freq: Every day | ORAL | 5 refills | Status: DC

## 2020-10-19 MED ORDER — LENALIDOMIDE 25 MG PO CAPS: 25.0000 mg | ORAL_CAPSULE | Freq: Every day | ORAL | 0 refills | Status: DC

## 2020-10-19 MED FILL — MORPHINE ER 15MG/12 TAB: 30 days supply | Qty: 60

## 2020-10-19 MED FILL — DEXAMETHASON 4MG TAB: 28 days supply | Qty: 20

## 2020-10-19 MED FILL — OXYCOD/APAP 5-325MG TAB: 7 days supply | Qty: 84

## 2020-10-19 MED FILL — ACYCLOVIR 400MG TAB: 30 days supply | Qty: 60

## 2020-10-19 MED FILL — TRAMADOL HCL 50MG TAB: 30 days supply | Qty: 60

## 2020-10-19 MED FILL — PROCHLORPER 10MG TAB: 15 days supply | Qty: 60

## 2020-10-19 MED FILL — ONDANSETRON 8MG TAB: 9 days supply | Qty: 18

## 2020-10-19 NOTE — Progress Notes (Signed)
Radiation Oncology Follow up Note  Name: Hannah Mcdonald   Date:   10/19/2020 MRN:  [id number] DOB: 06-17-1955    This 65 y.o. female presents to the clinic today for 2-week follow-up status post palliative radiation therapy to both L3 and right iliac crest for involvement of multiple myeloma.  REFERRING PROVIDER: Albina Billet, MD  HPI: Patient is a 65 year old female now out 2 weeks having completed palliative radiation therapy to an area of L3 involvement and right iliac crest involvement of multiple myeloma.  She states initially her pain had improved although recently has gotten more generalized radiating bilaterally in her lower back.  Getting out of bed in the morning is difficult.  She is just seeing Dr. Grayland Ormond who is going to start.daratumumab, Velcade, Revlimid, and dexamethasone .  She is also has a consult at [facility] in September for evaluation of possible bone marrow transplant.  She is ambulating well no focal neurologic deficits.  She states she has little bloating in her abdomen no significant diarrhea at this time.  COMPLICATIONS OF TREATMENT: none  FOLLOW UP COMPLIANCE: keeps appointments   PHYSICAL EXAM:  There were no vitals taken for this visit. Well-developed well-nourished patient in NAD. HEENT reveals PERLA, EOMI, discs not visualized.  Oral cavity is clear. No oral mucosal lesions are identified. Neck is clear without evidence of cervical or supraclavicular adenopathy. Lungs are clear to A&P. Cardiac examination is essentially unremarkable with regular rate and rhythm without murmur rub or thrill. Abdomen is benign with no organomegaly or masses noted. Motor sensory and DTR levels are equal and symmetric in the upper and lower extremities. Cranial nerves II through XII are grossly intact. Proprioception is intact. No peripheral adenopathy or edema is identified. No motor or sensory levels are noted. Crude visual fields are within normal range.  RADIOLOGY RESULTS: Patient has  ordered MRI scans of her spine which I will review when it becomes available.  PLAN: At this time I will review her MRI scans when the become available.  I would recommend no further treatment at this time she does have an area in C5 the right facet which is hypermetabolic consistent with probable myelomatous involvement.  She is really had not having significant neck pain.  She will start chemotherapy and evaluation for bone marrow transplant.  I will turn follow-up care over to Dr. Grayland Ormond.  Be happy to reevaluate the patient in time should further treatment be indicated.  I would like to take this opportunity to thank you for allowing me to participate in the care of your patient.Noreene Filbert, MD

## 2020-10-19 NOTE — Progress Notes (Signed)
Low back pain, shoulder pain, and low abdominal pain has increased in the past 2 weeks, 9/10 pain scale.  Feels abdominal bloating which makes it difficult to eat much so she has a loss of appetite.

## 2020-10-19 NOTE — Telephone Encounter (Signed)
Oral Oncology Patient Advocate Encounter   Received notification from [facility] that prior authorization for Revlimid is required.   PA submitted on CoverMyMeds Key [id number] Status is pending   Oral Oncology Clinic will continue to follow.  [place] Patient [place] Phone [phone] Fax [phone] 10/19/2020 11:14 AM

## 2020-10-19 NOTE — Telephone Encounter (Signed)
Oral Oncology Patient Advocate Encounter  Prior Authorization for Revlimid has been approved.    PA# [id number] Effective dates: 10/19/20 through 10/19/21  Patients co-pay is $0.00.  Oral Oncology Clinic will continue to follow.   [place] Patient [place] Phone [phone] Fax [phone] 10/19/2020 11:33 AM

## 2020-10-19 NOTE — Progress Notes (Signed)
ON PATHWAY REGIMEN - Multiple Myeloma and Other Plasma Cell Dyscrasias  No Change  Continue With Treatment as Ordered.  Original Decision Date/Time: 09/16/2020 11:32   DaraVRd (Daratumumab SUBQ + Bortezomib SUBQ + Lenalidomide PO + Dexamethasone IV/PO) q21 Days (Induction Schema):   A cycle is every 21 days:     Lenalidomide      Dexamethasone      Bortezomib      Daratumumab and hyaluronidase-fihj    DaraVRd (Daratumumab SUBQ + Bortezomib SUBQ + Lenalidomide PO + Dexamethasone IV/PO) q21 Days (Consolidation Schema):   A cycle is every 21 days:     Lenalidomide      Dexamethasone      Bortezomib      Daratumumab and hyaluronidase-fihj   **Always confirm dose/schedule in your pharmacy ordering system**  Patient Characteristics: Multiple Myeloma, Newly Diagnosed, Transplant Eligible, Standard Risk Disease Classification: Multiple Myeloma R-ISS Staging: I Therapeutic Status: Newly Diagnosed Is Patient Eligible for Transplant<= Transplant Eligible Risk Status: Standard Risk Intent of Therapy: Curative Intent, Discussed with Patient

## 2020-10-19 NOTE — Telephone Encounter (Signed)
Oral Oncology Pharmacist Encounter  Received new prescription for Revlimid (lenalidomide) for the treatment of newly diagnosed multiple myeloma in conjunction with daratumumab, bortezomib, and dexamethasone, planned duration until disease control or unacceptable drug toxicity. Planned start 11/02/20.  Patient will be evaluated for transplant eligibility.   CMP from 07/21/20 assessed, no relevant lab abnormalities. Patient is scheduled for repeat lab work. Prescription dose and frequency assessed.   Current medication list in Epic reviewed, no DDIs with lenalidomide identified.  Evaluated chart and no patient barriers to medication adherence identified.   Prescription has been e[facility]scribed [facility]. Patient is aware and provide with the phone number to [facility].  Oral Oncology Clinic will continue to follow for insurance authorization, copayment issues, initial counseling and start date.  Patient agreed to treatment on 10/19/20 per MD documentation.  Darl Pikes, PharmD, BCPS, BCOP, CPP Hematology/Oncology Clinical Pharmacist Practitioner [facility] [phone]  10/19/2020 1:06 PM

## 2020-10-19 NOTE — Telephone Encounter (Signed)
Tramadol PA approved from 10/19/20 thru 10/19/21, faxed to [facility].

## 2020-10-19 NOTE — Telephone Encounter (Addendum)
Prior authorization request submitted via Cover My Meds:  Tramadol Key: [id number] Morphine Key: [id number] Ondansetron Key: [id number]

## 2020-10-20 ENCOUNTER — Encounter: Payer: Self-pay | Admitting: Oncology

## 2020-10-20 DIAGNOSIS — C9000 Multiple myeloma not having achieved remission: Secondary | ICD-10-CM

## 2020-10-20 DIAGNOSIS — M899 Disorder of bone, unspecified: Secondary | ICD-10-CM

## 2020-10-20 MED FILL — REVLIMID 25MG CAP: 21 days supply | Qty: 14

## 2020-10-20 NOTE — Telephone Encounter (Signed)
All medications have been approved and notification faxed to pharmacy.

## 2020-10-20 NOTE — Progress Notes (Signed)
Patient has Multiple myeloma with worsening back pain with pathologic fractures seen on recent MRI (Dr. Grayland Ormond has discussed with Dr. Rudene Christians).  Referral faxed to [facility] orthopedic for evaluation.  P: [phone] F:  [phone]

## 2020-10-21 ENCOUNTER — Inpatient Hospital Stay: Payer: BC Managed Care – PPO

## 2020-10-21 ENCOUNTER — Telehealth: Payer: Self-pay

## 2020-10-21 NOTE — Telephone Encounter (Signed)
I called and spoke with Hannah Mcdonald and informed her that she was Scheduled for a Port Placement on 8/31 at [facility] but please Arrive at appointment at [place].

## 2020-10-22 ENCOUNTER — Other Ambulatory Visit: Payer: Self-pay | Admitting: Orthopedic Surgery

## 2020-10-25 ENCOUNTER — Other Ambulatory Visit: Payer: Self-pay | Admitting: Oncology

## 2020-10-25 NOTE — Progress Notes (Signed)
Pharmacist Chemotherapy Monitoring - Initial Assessment    Anticipated start date: 11/02/20   The following has been reviewed per standard work regarding the patient's treatment regimen: The patient's diagnosis, treatment plan and drug doses, and organ/hematologic function Lab orders and baseline tests specific to treatment regimen  The treatment plan start date, drug sequencing, and pre-medications Prior authorization status  Patient's documented medication list, including drug-drug interaction screen and prescriptions for anti-emetics and supportive care specific to the treatment regimen The drug concentrations, fluid compatibility, administration routes, and timing of the medications to be used The patient's access for treatment and lifetime cumulative dose history, if applicable  The patient's medication allergies and previous infusion related reactions, if applicable   Changes made to treatment plan:  N/A  Follow up needed:  Pending authorization for treatment  and prescriptions needed for zometa  with    Need Zometa orders    Adelina Mings, [place], 10/25/2020  11:18 AM

## 2020-10-26 ENCOUNTER — Other Ambulatory Visit: Payer: Self-pay | Admitting: Radiology

## 2020-10-26 NOTE — Progress Notes (Signed)
Patient on schedule for Port placement , called and spoke with patient on phone with pre procedure instructions given. Made aware to be here @ 0800, NPO after MN tonight as well as driver post procedure/recovery/discharge. Stated understanding.

## 2020-10-27 ENCOUNTER — Ambulatory Visit
Admission: RE | Admit: 2020-10-27 | Discharge: 2020-10-27 | Disposition: A | Discharge status: Home / Self Care | Payer: BC Managed Care – PPO | Source: Ambulatory Visit | Attending: Oncology | Admitting: Oncology

## 2020-10-27 ENCOUNTER — Other Ambulatory Visit
Admission: RE | Admit: 2020-10-27 | Discharge: 2020-10-27 | Disposition: A | Discharge status: Home / Self Care | Payer: BC Managed Care – PPO | Source: Ambulatory Visit | Attending: Oncology | Admitting: Oncology

## 2020-10-27 ENCOUNTER — Other Ambulatory Visit: Payer: Self-pay

## 2020-10-27 ENCOUNTER — Encounter: Payer: Self-pay | Admitting: Radiology

## 2020-10-27 DIAGNOSIS — C9000 Multiple myeloma not having achieved remission: Secondary | ICD-10-CM | POA: Diagnosis present

## 2020-10-27 DIAGNOSIS — Z79899 Other long term (current) drug therapy: Secondary | ICD-10-CM | POA: Diagnosis not present

## 2020-10-27 HISTORY — PX: IR IMAGING GUIDED PORT INSERTION: IMG5740

## 2020-10-27 LAB — GLUCOSE, CAPILLARY: Glucose-Capillary: 169 mg/dL — ABNORMAL HIGH (ref 70–99)

## 2020-10-27 MED ORDER — FENTANYL CITRATE (PF) 100 MCG/2ML IJ SOLN
INTRAMUSCULAR | Status: AC
  Filled 2020-10-27: qty 2

## 2020-10-27 MED ORDER — LIDOCAINE-EPINEPHRINE 1 %-1:100000 IJ SOLN
INTRAMUSCULAR | Status: AC | PRN
  Administered 2020-10-27: 10 mL

## 2020-10-27 MED ORDER — CEFAZOLIN SODIUM-DEXTROSE 2-4 GM/100ML-% IV SOLN
2.0000 g | INTRAVENOUS | Status: AC
  Administered 2020-10-28: 2 g via INTRAVENOUS

## 2020-10-27 MED ORDER — ORAL CARE MOUTH RINSE: 15.0000 mL | Freq: Once | OROMUCOSAL | Status: AC

## 2020-10-27 MED ORDER — SODIUM CHLORIDE 0.9 % IV SOLN
INTRAVENOUS | Status: DC
  Filled 2020-10-27: qty 1000

## 2020-10-27 MED ORDER — FENTANYL CITRATE (PF) 100 MCG/2ML IJ SOLN
INTRAMUSCULAR | Status: AC | PRN
  Administered 2020-10-27 (×2): 50 ug via INTRAVENOUS

## 2020-10-27 MED ORDER — CHLORHEXIDINE GLUCONATE 0.12 % MT SOLN: 15.0000 mL | Freq: Once | OROMUCOSAL | Status: AC

## 2020-10-27 MED ORDER — SODIUM CHLORIDE 0.9 % IV SOLN: INTRAVENOUS | Status: DC

## 2020-10-27 MED ORDER — MIDAZOLAM HCL 2 MG/2ML IJ SOLN
INTRAMUSCULAR | Status: AC
  Filled 2020-10-27: qty 2

## 2020-10-27 MED ORDER — MIDAZOLAM HCL 2 MG/2ML IJ SOLN
INTRAMUSCULAR | Status: AC | PRN
  Administered 2020-10-27 (×2): 1 mg via INTRAVENOUS

## 2020-10-27 MED ORDER — HEPARIN SOD (PORK) LOCK FLUSH 100 UNIT/ML IV SOLN
INTRAVENOUS | Status: AC
  Administered 2020-10-27: 500 [IU]
  Filled 2020-10-27: qty 5

## 2020-10-27 NOTE — Patient Instructions (Addendum)
Your procedure is scheduled on: Thursday October 28, 2020 Report to Day Surgery  To find out your arrival time please call [phone] between 1PM - 3PM on Wednesday .  Remember: Instructions that are not followed completely may result in serious medical risk,  up to and including death, or upon the discretion of your surgeon and anesthesiologist your  surgery may need to be rescheduled.     _X__ 1. Do not eat food after midnight the night before your procedure.                 No chewing gum or hard candies. You may drink clear liquids up to 2 hours                 before you are scheduled to arrive for your surgery- DO not drink clear                 liquids within 2 hours of the start of your surgery.                 Clear Liquids include:  water,   __X__2.  On the morning of surgery brush your teeth with toothpaste and water, you                may rinse your mouth with mouthwash if you wish.  Do not swallow any toothpaste of mouthwash.     _X__ 3.  No Alcohol for 24 hours before or after surgery.   _X__ 4.  Do Not Smoke or use e-cigarettes For 24 Hours Prior to Your Surgery.                 Do not use any chewable tobacco products for at least 6 hours prior to                 Surgery.  _X__  5.  Do not use any recreational drugs (marijuana, cocaine, heroin, ecstasy, MDMA or other)                For at least one week prior to your surgery.  Combination of these drugs with anesthesia                May have life threatening results.  __X__  7.  Notify your doctor if there is any change in your medical condition      (cold, fever, infections).     Do not wear jewelry, make-up, hairpins, clips or nail polish. Do not wear lotions, powders, or perfumes. Do not shave 48 hours prior to surgery.  Do not bring valuables to the hospital.    [facility] is not responsible for any belongings or valuables.  Contacts, dentures or bridgework may not be worn into  surgery. Leave your suitcase in the car. After surgery it may be brought to your room. For patients admitted to the hospital, discharge time is determined by your treatment team.   Patients discharged the day of surgery will not be allowed to drive home.   Make arrangements for someone to be with you for the first 24 hours of your Same Day Discharge.   __X__ Take these medicines the morning of surgery with A SIP OF WATER:    1. metoprolol succinate (TOPROL-XL) 50 MG   2. busPIRone (BUSPAR) 30 MG   3. omeprazole (PRILOSEC) 20 MG  4.  5.  6.  ____ Fleet Enema (as directed)   ____ Use CHG Soap (or wipes)  as directed  ____ Use Benzoyl Peroxide Gel as instructed  ____ Use inhalers on the day of surgery  __X__ Stop metformin 2 days prior to surgery    ____ Take 1/2 of usual insulin dose the night before surgery. No insulin the morning          of surgery.   ____ Call your PCP, cardiologist, or Pulmonologist if taking Coumadin/Plavix/aspirin and ask when to stop before your surgery.   __X__ One Week prior to surgery- Stop Anti-inflammatories such as Ibuprofen, Aleve, Advil, Motrin, meloxicam (MOBIC), diclofenac, etodolac, ketorolac, Toradol, Daypro, piroxicam, Goody's or BC powders. OK TO USE TYLENOL IF NEEDED   __X__ Stop supplements until after surgery.    ____ Bring C-Pap to the hospital.    If you have any questions regarding your pre-procedure instructions,  Please call Pre-admit Testing at [phone]

## 2020-10-27 NOTE — Procedures (Signed)
Interventional Radiology Procedure Note  Procedure: Chest port  Indication: Myeloma  Findings: Please refer to procedural dictation for full description.  Complications: None  EBL: < 10 mL  Miachel Roux, MD [phone]

## 2020-10-27 NOTE — H&P (Signed)
Chief Complaint: Patient was seen in consultation today for myeloma treatment requiring port a catheter placement at the request of Finnegan,Timothy J  Referring Physician(s): Finnegan,Timothy J  Supervising Physician: Mir, Sharen Heck  Patient Status: [facility] - Out-pt  History of Present Illness: Hannah Mcdonald is a 65 y.o. female who is known to our service with recent image guided biopsies of right iliac wing lesion and bone marrow aspirate and biopsy. Pathology revealed lambda light chain myeloma, patient has been seen by Dr. Grayland Ormond on 8/23 with request received for image guided port a catheter placement for treatment. The patient has had a H&P performed within the last 30 days, all history, medications, and exam have been reviewed. The patient denies any interval changes since the H&P. She denies any current chest pain or shortness of breath, denies any recent infections, fever or chills. She denies any difficulty with previous sedation.    Past Medical History:  Diagnosis Date   Anxiety    Diabetes mellitus without complication ([place]) 1937   GERD (gastroesophageal reflux disease)    Hyperlipidemia    Personal history of colonic polyps    Squamous cell carcinoma of skin 11/25/2013   Left chest. WD SCC with superficial infiltration.   Tachycardia     Past Surgical History:  Procedure Laterality Date   AUGMENTATION MAMMAPLASTY Bilateral 9024   silicone   [place], 1989   COLONOSCOPY  2008, 2013   COLONOSCOPY WITH PROPOFOL N/A 08/16/2016   Procedure: COLONOSCOPY WITH PROPOFOL;  Surgeon: Robert Bellow, MD;  Location: [facility] ENDOSCOPY;  Service: Endoscopy;  Laterality: N/A;   DILATION AND CURETTAGE OF UTERUS     ENDOMETRIAL ABLATION  12/1991   ganglion cyst removal      TONSILLECTOMY     WRIST SURGERY Right 05/1995    Allergies: Patient has no known allergies.  Medications: Prior to Admission medications   Medication  Sig Start Date End Date Taking? Authorizing Provider  acyclovir (ZOVIRAX) 400 MG tablet Take 1 tablet (400 mg total) by mouth 2 (two) times daily. 10/19/20   Lloyd Huger, MD  atorvastatin (LIPITOR) 20 MG tablet Take 20 mg by mouth daily.    [provider]  busPIRone (BUSPAR) 30 MG tablet Take 30 mg by mouth 2 (two) times daily.    [provider]  CALCIUM-VITAMIN D PO Take 1 tablet by mouth daily.    [provider]  cyclobenzaprine (FLEXERIL) 10 MG tablet Take 10 mg by mouth at bedtime.    [provider]  dexamethasone (DECADRON) 4 MG tablet Take 5 tabs (20 mg) weekly the day after daratumumab for 12 weeks. Then take on the day after daratumumab and daily x 2 days on non-chemotherapy weeks for 6 weeks. Take with breakfast. 10/19/20   Lloyd Huger, MD  lenalidomide (REVLIMID) 25 MG capsule Take 1 capsule (25 mg total) by mouth daily. Take for 14 days, then hold for 7 days. Repeat every 21 days. 10/19/20   Lloyd Huger, MD  liraglutide (VICTOZA) 18 MG/3ML SOPN Inject 1.2 mg into the skin daily.    [provider]  meloxicam (MOBIC) 15 MG tablet Take 15 mg by mouth daily.    [provider]  metFORMIN (GLUCOPHAGE) 500 MG tablet Take 1,000 mg by mouth 2 (two) times daily with a meal.    [provider]  metoprolol succinate (TOPROL-XL) 50 MG 24 hr tablet Take 50 mg by mouth 2 (  two) times daily. Take with or immediately following a meal.    [provider]  Misc Natural Products (GLUCOSAMINE CHOND CMP TRIPLE) TABS Take 1 tablet by mouth daily.    [provider]  morphine (MS CONTIN) 15 MG 12 hr tablet Take 1 tablet (15 mg total) by mouth every 12 (twelve) hours. 10/19/20   Lloyd Huger, MD  Multiple Vitamin (MULTIVITAMIN WITH MINERALS) TABS tablet Take 1 tablet by mouth daily.    [provider]  omeprazole (PRILOSEC) 20 MG capsule Take 20 mg by mouth daily.    [provider]   ondansetron (ZOFRAN) 8 MG tablet Take 1 tablet (8 mg total) by mouth 2 (two) times daily as needed (Nausea or vomiting). 10/19/20   Lloyd Huger, MD  oxyCODONE-acetaminophen (PERCOCET/ROXICET) 5-325 MG tablet Take 1-2 tablets by mouth every 4 (four) hours as needed for severe pain. 10/19/20   Lloyd Huger, MD  prochlorperazine (COMPAZINE) 10 MG tablet Take 1 tablet (10 mg total) by mouth every 6 (six) hours as needed (Nausea or vomiting). 10/19/20   Lloyd Huger, MD  traMADol (ULTRAM) 50 MG tablet Take 1 tablet (50 mg total) by mouth 2 (two) times daily as needed for moderate pain. 10/19/20   Lloyd Huger, MD     Family History  Problem Relation Age of Onset   Colon polyps Sister    Colon cancer Father 36   Diabetes Mother    Breast cancer Neg Hx     Social History   Socioeconomic History   Marital status: Married    Spouse name: Not on file   Number of children: Not on file   Years of education: Not on file   Highest education level: Not on file  Occupational History   Not on file  Tobacco Use   Smoking status: Former    Packs/day: 1.00    Years: 4.00    Pack years: 4.00    Types: Cigarettes   Smokeless tobacco: Never  Substance and Sexual Activity   Alcohol use: Yes   Drug use: No   Sexual activity: Not on file  Other Topics Concern   Not on file  Social History Narrative   Not on file   Social Determinants of Health   Financial Resource Strain: Not on file  Food Insecurity: Not on file  Transportation Needs: Not on file  Physical Activity: Not on file  Stress: Not on file  Social Connections: Not on file   Review of Systems: A 12 point ROS discussed and pertinent positives are indicated in the HPI above.  All other systems are negative.  Review of Systems  Vital Signs: There were no vitals taken for this visit.  Physical Exam General: A&O, NAD Heart: RR Lungs: CTA b/l, no resp distress  Imaging: MR Thoracic Spine W Wo  Contrast  Result Date: 10/19/2020 CLINICAL DATA:  Initial evaluation for 2-3 weeks of central, right and left-sided mid back pain extending to the ribs and lumbar spine. History of recent fall on 07/08/2020. History of lambda light chain multiple myeloma. EXAM: MRI THORACIC AND LUMBAR SPINE WITHOUT AND WITH CONTRAST TECHNIQUE: Multiplanar and multiecho pulse sequences of the thoracic and lumbar spine were obtained without and with intravenous contrast. CONTRAST:  20m GADAVIST GADOBUTROL 1 MMOL/ML IV SOLN COMPARISON:  Prior PET-CT from 08/27/2020. FINDINGS: MRI THORACIC SPINE FINDINGS Alignment: Trace levoscoliosis. Alignment otherwise normal with preservation of the normal thoracic kyphosis. No listhesis. Vertebrae: Diffusely abnormal appearance of  the visualized bone marrow with innumerable T1 hypointense, stir hyperintense enhancing lesions within the thoracic spine, consistent with history of multiple myeloma. There is involvement of the centrally all levels. For reference purposes, the largest discrete lesion is seen at the central posterior aspect of T10 and measures 1.3 cm (series 17, image 9). Minimal convex bowing of the posterior vertebral margin at this level without frank epidural extension (series 19, image 9). Small focus of extra osseous extension measuring 1.5 cm seen at the right anterolateral aspect of the T9-10 level (series 24, image 5). No other significant extra osseous extension. No epidural tumor. Multiple pathologic fractures are seen as follows; - Acute to subacute fracture involving the superior endplate of T2 without significant height loss or bony retropulsion (series 19, image 9). - Acute to subacute compression fracture involving the superior endplate of T4 with 87% anterior height loss without bony retropulsion (series 19, image 9). - Acute to subacute compression fracture involving the superior endplate of T5 with up to 30% central height loss without significant bony retropulsion  (series 19, image 9). - Acute to subacute compression fracture involving the superior endplate of N79 with mild 25% height loss with trace 2 mm bony retropulsion (series 19, image 9). Involvement of the left posterior eighth rib with suspected pathologic fracture noted as well (series 22, image 25). Few additional scattered subcentimeter lesions noted within the ribs elsewhere. Cord: Normal signal and morphology. No significant epidural tumor or abnormal enhancement. Paraspinal and other soft tissues: Paraspinous soft tissues demonstrate no acute or significant finding. Disc levels: T1-2: Minimal disc bulge.  No stenosis. T3-4: Minimal disc bulge with superimposed left foraminal disc protrusion (series 17, image 10). No significant stenosis. T4-5: Tiny right paracentral disc protrusion without stenosis. C6-7: Mild disc bulge.  No significant stenosis. T7-8: Mild disc bulge, eccentric to the right. No significant stenosis. T11-12: Minimal disc bulge with trace 2 mm bony retropulsion related to the T12 fracture. No significant stenosis. MRI LUMBAR SPINE FINDINGS Segmentation: Standard. Lowest well-formed disc space labeled the L5-S1 level. Alignment: 6 mm anterolisthesis of L4 on L5, with additional trace anterolisthesis of L2 on L3 and L3 on L4. Findings chronic and facet mediated. Underlying trace dextroscoliosis. Vertebrae: Multiple marrow replacing lesions seen throughout the lumbar spine, consistent with history of multiple myeloma. There is involvement of essentially all levels. Involvement of the visualized sacrum and pelvis as well. The dominant lesion is seen involving the right iliac crest, partially visualize, but measuring approximately 6.6 x 4.2 cm (series 4, image 28). Associated extraosseous extension into the adjacent iliacus and gluteal musculature. No other significant extra osseous extension of tumor. A 1.4 cm lesion involving the anterior aspect of L3 demonstrates a trabeculated morphology and is  somewhat indeterminate, and could reflect an atypical hemangioma. Associated pathologic compression fracture involving the right aspect of the superior endplate of L3 with no more than mild 10% height loss without bony retropulsion. Vertebral body height otherwise maintained with no other fracture identified. Conus medullaris: Extends to the L1 level and appears normal. No epidural tumor or abnormal enhancement. Paraspinal and other soft tissues: Paraspinous soft tissues otherwise unremarkable. Retroaortic left renal vein noted. Visualized visceral structures otherwise unremarkable. Disc levels: L1-2:  Negative interspace.  Mild facet hypertrophy.  No stenosis. L2-3: Mild disc bulge with disc desiccation. Mild bilateral facet hypertrophy. No significant spinal stenosis. Foramina remain patent. L3-4: Trace anterolisthesis. Mild disc bulge with disc desiccation. Superimposed broad-based left foraminal disc protrusion closely approximates the exiting left  L3 nerve root (series 4, image 22). Mild bilateral facet hypertrophy. No significant spinal stenosis. Foramina remain patent. L4-5: 6 mm anterolisthesis. Degenerative intervertebral disc space narrowing with disc desiccation and diffuse disc bulge. Moderate bilateral facet arthrosis. Resultant moderate spinal stenosis. Moderate right with mild left L4 foraminal narrowing. L5-S1: Mild disc bulge. Mild bilateral facet hypertrophy. No significant stenosis. IMPRESSION: 1. Multiple marrow replacing lesions scattered throughout the thoracic and lumbar spine, consistent with history of multiple myeloma. 2. Associated acute to subacute pathologic fractures involving the T2, T4, T5, T12, and L3 vertebral bodies as above. No significant bony retropulsion or associated stenosis. 3. Focal pathologic fracture involving the left posterior eighth rib. 4. Small amount of extra osseous extension of tumor along the right anterolateral aspect of the vertebral column at the level of  T9-10. Additional local extraosseous extension about a large 6.6 cm lesion involving the right iliac crest. No other significant extra osseous extension at this time. No epidural or intracanalicular involvement. 5. 6 mm facet mediated anterolisthesis of L4 on L5 with resultant moderate canal with mild to moderate right worse than left L4 foraminal stenosis. 6. Additional more mild multilevel spondylosis elsewhere throughout the thoracolumbar spine as above. No other significant stenosis or neural impingement. Electronically Signed   By: Jeannine Boga M.D.   On: 10/19/2020 22:05   MR Lumbar Spine W Wo Contrast  Result Date: 10/19/2020 CLINICAL DATA:  Initial evaluation for 2-3 weeks of central, right and left-sided mid back pain extending to the ribs and lumbar spine. History of recent fall on 07/08/2020. History of lambda light chain multiple myeloma. EXAM: MRI THORACIC AND LUMBAR SPINE WITHOUT AND WITH CONTRAST TECHNIQUE: Multiplanar and multiecho pulse sequences of the thoracic and lumbar spine were obtained without and with intravenous contrast. CONTRAST:  20m GADAVIST GADOBUTROL 1 MMOL/ML IV SOLN COMPARISON:  Prior PET-CT from 08/27/2020. FINDINGS: MRI THORACIC SPINE FINDINGS Alignment: Trace levoscoliosis. Alignment otherwise normal with preservation of the normal thoracic kyphosis. No listhesis. Vertebrae: Diffusely abnormal appearance of the visualized bone marrow with innumerable T1 hypointense, stir hyperintense enhancing lesions within the thoracic spine, consistent with history of multiple myeloma. There is involvement of the centrally all levels. For reference purposes, the largest discrete lesion is seen at the central posterior aspect of T10 and measures 1.3 cm (series 17, image 9). Minimal convex bowing of the posterior vertebral margin at this level without frank epidural extension (series 19, image 9). Small focus of extra osseous extension measuring 1.5 cm seen at the right anterolateral  aspect of the T9-10 level (series 24, image 5). No other significant extra osseous extension. No epidural tumor. Multiple pathologic fractures are seen as follows; - Acute to subacute fracture involving the superior endplate of T2 without significant height loss or bony retropulsion (series 19, image 9). - Acute to subacute compression fracture involving the superior endplate of T4 with 287%anterior height loss without bony retropulsion (series 19, image 9). - Acute to subacute compression fracture involving the superior endplate of T5 with up to 30% central height loss without significant bony retropulsion (series 19, image 9). - Acute to subacute compression fracture involving the superior endplate of TO67with mild 25% height loss with trace 2 mm bony retropulsion (series 19, image 9). Involvement of the left posterior eighth rib with suspected pathologic fracture noted as well (series 22, image 25). Few additional scattered subcentimeter lesions noted within the ribs elsewhere. Cord: Normal signal and morphology. No significant epidural tumor or abnormal enhancement. Paraspinal and  other soft tissues: Paraspinous soft tissues demonstrate no acute or significant finding. Disc levels: T1-2: Minimal disc bulge.  No stenosis. T3-4: Minimal disc bulge with superimposed left foraminal disc protrusion (series 17, image 10). No significant stenosis. T4-5: Tiny right paracentral disc protrusion without stenosis. C6-7: Mild disc bulge.  No significant stenosis. T7-8: Mild disc bulge, eccentric to the right. No significant stenosis. T11-12: Minimal disc bulge with trace 2 mm bony retropulsion related to the T12 fracture. No significant stenosis. MRI LUMBAR SPINE FINDINGS Segmentation: Standard. Lowest well-formed disc space labeled the L5-S1 level. Alignment: 6 mm anterolisthesis of L4 on L5, with additional trace anterolisthesis of L2 on L3 and L3 on L4. Findings chronic and facet mediated. Underlying trace  dextroscoliosis. Vertebrae: Multiple marrow replacing lesions seen throughout the lumbar spine, consistent with history of multiple myeloma. There is involvement of essentially all levels. Involvement of the visualized sacrum and pelvis as well. The dominant lesion is seen involving the right iliac crest, partially visualize, but measuring approximately 6.6 x 4.2 cm (series 4, image 28). Associated extraosseous extension into the adjacent iliacus and gluteal musculature. No other significant extra osseous extension of tumor. A 1.4 cm lesion involving the anterior aspect of L3 demonstrates a trabeculated morphology and is somewhat indeterminate, and could reflect an atypical hemangioma. Associated pathologic compression fracture involving the right aspect of the superior endplate of L3 with no more than mild 10% height loss without bony retropulsion. Vertebral body height otherwise maintained with no other fracture identified. Conus medullaris: Extends to the L1 level and appears normal. No epidural tumor or abnormal enhancement. Paraspinal and other soft tissues: Paraspinous soft tissues otherwise unremarkable. Retroaortic left renal vein noted. Visualized visceral structures otherwise unremarkable. Disc levels: L1-2:  Negative interspace.  Mild facet hypertrophy.  No stenosis. L2-3: Mild disc bulge with disc desiccation. Mild bilateral facet hypertrophy. No significant spinal stenosis. Foramina remain patent. L3-4: Trace anterolisthesis. Mild disc bulge with disc desiccation. Superimposed broad-based left foraminal disc protrusion closely approximates the exiting left L3 nerve root (series 4, image 22). Mild bilateral facet hypertrophy. No significant spinal stenosis. Foramina remain patent. L4-5: 6 mm anterolisthesis. Degenerative intervertebral disc space narrowing with disc desiccation and diffuse disc bulge. Moderate bilateral facet arthrosis. Resultant moderate spinal stenosis. Moderate right with mild left L4  foraminal narrowing. L5-S1: Mild disc bulge. Mild bilateral facet hypertrophy. No significant stenosis. IMPRESSION: 1. Multiple marrow replacing lesions scattered throughout the thoracic and lumbar spine, consistent with history of multiple myeloma. 2. Associated acute to subacute pathologic fractures involving the T2, T4, T5, T12, and L3 vertebral bodies as above. No significant bony retropulsion or associated stenosis. 3. Focal pathologic fracture involving the left posterior eighth rib. 4. Small amount of extra osseous extension of tumor along the right anterolateral aspect of the vertebral column at the level of T9-10. Additional local extraosseous extension about a large 6.6 cm lesion involving the right iliac crest. No other significant extra osseous extension at this time. No epidural or intracanalicular involvement. 5. 6 mm facet mediated anterolisthesis of L4 on L5 with resultant moderate canal with mild to moderate right worse than left L4 foraminal stenosis. 6. Additional more mild multilevel spondylosis elsewhere throughout the thoracolumbar spine as above. No other significant stenosis or neural impingement. Electronically Signed   By: Jeannine Boga M.D.   On: 10/19/2020 22:05    Labs:  CBC: Recent Labs    09/15/20 1032 09/22/20 1037 09/29/20 1039 10/19/20 0951  WBC 4.5 3.5* 3.3* 3.6*  HGB  13.2 13.3 13.1 12.7  HCT 40.7 40.9 40.3 38.8  PLT 206 177 170 178    COAGS: No results for input(s): INR, APTT in the last 8760 hours.  BMP: Recent Labs    07/21/20 1017  NA 139  K 4.3  CL 101  CO2 22  GLUCOSE 130*  BUN 24*  CALCIUM 9.7  CREATININE 0.78  GFRNONAA >60    LIVER FUNCTION TESTS: Recent Labs    07/21/20 1017  BILITOT 0.7  AST 64*  ALT 62*  ALKPHOS 76  PROT 7.5  ALBUMIN 4.9    Assessment and Plan: History of right iliac wing lesion s/p CT biopsy with our service on 08/17/20 S/p CT guided bone marrow biopsy with our service 6/30 Seen by Dr. Grayland Ormond  10/19/20 and request received for image guided port a catheter placement with IR for treatment of lambda light chain myeloma.  The patient has been NPO, no blood thinners taken, labs and vitals have been reviewed.   Risks and benefits of image guided port-a-catheter placement was discussed with the patient including, but not limited to bleeding, infection, pneumothorax, or fibrin sheath development and need for additional procedures.  All of the patient's questions were answered, patient is agreeable to proceed. Consent signed and in chart.   Thank you for this interesting consult.  I greatly enjoyed meeting Hannah Mcdonald and look forward to participating in their care.  A copy of this report was sent to the requesting provider on this date.  Electronically Signed: Hedy Jacob, PA-C 10/27/2020, 8:16 AM   I spent a total of 15 Minutes in face to face in clinical consultation, greater than 50% of which was counseling/coordinating care for myeloma.

## 2020-10-27 NOTE — Progress Notes (Signed)
Patient clinically stable post Port placement per DR Mir, tolerated well. Vitals stable pre and post procedure. Denies complaints at this time. Awake/alert and oriented post procedure. Received Versed 2 mg along with Fentanyl 100 mcg IV for procedure. Report given to Genelle Bal RN post procedure/specials.

## 2020-10-28 ENCOUNTER — Other Ambulatory Visit: Payer: Self-pay

## 2020-10-28 ENCOUNTER — Ambulatory Visit: Payer: BC Managed Care – PPO

## 2020-10-28 ENCOUNTER — Encounter: Payer: Self-pay | Admitting: Orthopedic Surgery

## 2020-10-28 ENCOUNTER — Ambulatory Visit
Admission: RE | Admit: 2020-10-28 | Discharge: 2020-10-28 | Disposition: A | Discharge status: Home / Self Care | Payer: BC Managed Care – PPO | Attending: Orthopedic Surgery | Admitting: Orthopedic Surgery

## 2020-10-28 ENCOUNTER — Encounter
Admission: RE | Disposition: A | Discharge status: Home / Self Care | Payer: Self-pay | Source: Home / Self Care | Attending: Orthopedic Surgery

## 2020-10-28 ENCOUNTER — Ambulatory Visit: Payer: BC Managed Care – PPO | Admitting: Certified Registered Nurse Anesthetist

## 2020-10-28 DIAGNOSIS — C9000 Multiple myeloma not having achieved remission: Secondary | ICD-10-CM | POA: Diagnosis not present

## 2020-10-28 DIAGNOSIS — C7951 Secondary malignant neoplasm of bone: Secondary | ICD-10-CM | POA: Insufficient documentation

## 2020-10-28 DIAGNOSIS — M8458XA Pathological fracture in neoplastic disease, other specified site, initial encounter for fracture: Secondary | ICD-10-CM | POA: Insufficient documentation

## 2020-10-28 DIAGNOSIS — M4856XA Collapsed vertebra, not elsewhere classified, lumbar region, initial encounter for fracture: Secondary | ICD-10-CM | POA: Diagnosis present

## 2020-10-28 DIAGNOSIS — Z419 Encounter for procedure for purposes other than remedying health state, unspecified: Secondary | ICD-10-CM

## 2020-10-28 DIAGNOSIS — Z79899 Other long term (current) drug therapy: Secondary | ICD-10-CM | POA: Diagnosis not present

## 2020-10-28 DIAGNOSIS — E119 Type 2 diabetes mellitus without complications: Secondary | ICD-10-CM | POA: Insufficient documentation

## 2020-10-28 HISTORY — PX: KYPHOPLASTY: SHX5884

## 2020-10-28 LAB — GLUCOSE, CAPILLARY
Glucose-Capillary: 146 mg/dL — ABNORMAL HIGH (ref 70–99)
Glucose-Capillary: 156 mg/dL — ABNORMAL HIGH (ref 70–99)

## 2020-10-28 SURGERY — KYPHOPLASTY
Anesthesia: General

## 2020-10-28 MED ORDER — DEXMEDETOMIDINE (PRECEDEX) IN NS 20 MCG/5ML (4 MCG/ML) IV SYRINGE
PREFILLED_SYRINGE | INTRAVENOUS | Status: DC | PRN
  Administered 2020-10-28: 12 ug via INTRAVENOUS
  Administered 2020-10-28: 8 ug via INTRAVENOUS

## 2020-10-28 MED ORDER — BUPIVACAINE-EPINEPHRINE (PF) 0.5% -1:200000 IJ SOLN
INTRAMUSCULAR | Status: AC
  Filled 2020-10-28: qty 30

## 2020-10-28 MED ORDER — MIDAZOLAM HCL 2 MG/2ML IJ SOLN
INTRAMUSCULAR | Status: DC | PRN
  Administered 2020-10-28: 2 mg via INTRAVENOUS

## 2020-10-28 MED ORDER — LIDOCAINE HCL 1 % IJ SOLN
INTRAMUSCULAR | Status: DC | PRN
  Administered 2020-10-28: 70 mL

## 2020-10-28 MED ORDER — LIDOCAINE HCL 1 % IJ SOLN
INTRAMUSCULAR | Status: DC | PRN
  Administered 2020-10-28: 10 mL

## 2020-10-28 MED ORDER — CHLORHEXIDINE GLUCONATE 0.12 % MT SOLN
OROMUCOSAL | Status: AC
  Administered 2020-10-28: 15 mL via OROMUCOSAL
  Filled 2020-10-28: qty 15

## 2020-10-28 MED ORDER — PROPOFOL 500 MG/50ML IV EMUL
INTRAVENOUS | Status: DC | PRN
  Administered 2020-10-28: 75 ug/kg/min via INTRAVENOUS

## 2020-10-28 MED ORDER — OXYCODONE HCL 5 MG PO TABS: 5.0000 mg | ORAL_TABLET | Freq: Once | ORAL | Status: AC | PRN

## 2020-10-28 MED ORDER — FENTANYL CITRATE (PF) 100 MCG/2ML IJ SOLN
INTRAMUSCULAR | Status: AC
  Administered 2020-10-28: 25 ug via INTRAVENOUS
  Filled 2020-10-28: qty 2

## 2020-10-28 MED ORDER — LIDOCAINE HCL (PF) 1 % IJ SOLN
INTRAMUSCULAR | Status: AC
  Filled 2020-10-28: qty 60

## 2020-10-28 MED ORDER — FENTANYL CITRATE (PF) 100 MCG/2ML IJ SOLN
25.0000 ug | INTRAMUSCULAR | Status: DC | PRN
  Administered 2020-10-28 (×2): 25 ug via INTRAVENOUS

## 2020-10-28 MED ORDER — METHOCARBAMOL 500 MG PO TABS: 500.0000 mg | ORAL_TABLET | Freq: Four times a day (QID) | ORAL | 1 refills | Status: AC | PRN

## 2020-10-28 MED ORDER — ONDANSETRON HCL 4 MG/2ML IJ SOLN
INTRAMUSCULAR | Status: DC | PRN
  Administered 2020-10-28: 4 mg via INTRAVENOUS

## 2020-10-28 MED ORDER — OXYCODONE HCL 5 MG PO TABS
ORAL_TABLET | ORAL | Status: AC
  Administered 2020-10-28: 5 mg via ORAL
  Filled 2020-10-28: qty 1

## 2020-10-28 MED ORDER — SODIUM CHLORIDE FLUSH 0.9 % IV SOLN
INTRAVENOUS | Status: AC
  Filled 2020-10-28: qty 20

## 2020-10-28 MED ORDER — LIDOCAINE HCL (CARDIAC) PF 100 MG/5ML IV SOSY
PREFILLED_SYRINGE | INTRAVENOUS | Status: DC | PRN
  Administered 2020-10-28: 70 mg via INTRAVENOUS

## 2020-10-28 MED ORDER — FENTANYL CITRATE (PF) 100 MCG/2ML IJ SOLN
INTRAMUSCULAR | Status: AC
  Filled 2020-10-28: qty 2

## 2020-10-28 MED ORDER — FENTANYL CITRATE (PF) 100 MCG/2ML IJ SOLN
INTRAMUSCULAR | Status: DC | PRN
  Administered 2020-10-28: 50 ug via INTRAVENOUS
  Administered 2020-10-28 (×2): 25 ug via INTRAVENOUS

## 2020-10-28 MED ORDER — PROPOFOL 10 MG/ML IV BOLUS
INTRAVENOUS | Status: AC
  Filled 2020-10-28: qty 60

## 2020-10-28 MED ORDER — OXYCODONE HCL 5 MG/5ML PO SOLN: 5.0000 mg | Freq: Once | ORAL | Status: AC | PRN

## 2020-10-28 MED ORDER — MIDAZOLAM HCL 2 MG/2ML IJ SOLN
INTRAMUSCULAR | Status: AC
  Filled 2020-10-28: qty 2

## 2020-10-28 MED ORDER — IOHEXOL 180 MG/ML  SOLN
INTRAMUSCULAR | Status: DC | PRN
  Administered 2020-10-28: 20 mL

## 2020-10-28 MED ORDER — PROPOFOL 10 MG/ML IV BOLUS
INTRAVENOUS | Status: DC | PRN
Start: 2020-10-28 — End: 2020-10-28
  Administered 2020-10-28: 30 mg via INTRAVENOUS

## 2020-10-28 MED FILL — METHOCARBAM 500MG TAB: 10 days supply | Qty: 40

## 2020-10-28 SURGICAL SUPPLY — 24 items
ADH SKN CLS APL DERMABOND .7 (GAUZE/BANDAGES/DRESSINGS) ×1
CEMENT KYPHON CX01A KIT/MIXER (Cement) ×2 IMPLANT
DERMABOND ADVANCED (GAUZE/BANDAGES/DRESSINGS) ×1
DERMABOND ADVANCED .7 DNX12 (GAUZE/BANDAGES/DRESSINGS) ×1 IMPLANT
DEVICE BIOPSY BONE KYPHX (INSTRUMENTS) ×2 IMPLANT
DRAPE C-ARM XRAY 36X54 (DRAPES) ×2 IMPLANT
DURAPREP 26ML APPLICATOR (WOUND CARE) ×2 IMPLANT
GAUZE 4X4 16PLY ~~LOC~~+RFID DBL (SPONGE) ×2 IMPLANT
GLOVE SURG SYN 9.0  PF PI (GLOVE) ×1
GLOVE SURG SYN 9.0 PF PI (GLOVE) ×1 IMPLANT
GOWN SRG 2XL LVL 4 RGLN SLV (GOWNS) ×1 IMPLANT
GOWN STRL NON-REIN 2XL LVL4 (GOWNS) ×2
GOWN STRL REUS W/ TWL LRG LVL3 (GOWN DISPOSABLE) ×1 IMPLANT
GOWN STRL REUS W/TWL LRG LVL3 (GOWN DISPOSABLE) ×2
KIT OSTEOCOOL BONE ACCESS 8G (MISCELLANEOUS) ×4 IMPLANT
KIT OSTEOCOOL DUAL PROBE 15 (MISCELLANEOUS) ×2 IMPLANT
MANIFOLD NEPTUNE II (INSTRUMENTS) ×2 IMPLANT
PACK KYPHOPLASTY (MISCELLANEOUS) ×2 IMPLANT
RENTAL RFA GENERATOR (MISCELLANEOUS) IMPLANT
STRAP SAFETY 5IN WIDE (MISCELLANEOUS) ×2 IMPLANT
SWABSTK COMLB BENZOIN TINCTURE (MISCELLANEOUS) ×2 IMPLANT
TRAY KYPHOPAK 15/3 EXPRESS 1ST (MISCELLANEOUS) ×2 IMPLANT
TRAY KYPHOPAK 20/3 EXPRESS 1ST (MISCELLANEOUS) ×2 IMPLANT
WATER STERILE IRR 500ML POUR (IV SOLUTION) ×2 IMPLANT

## 2020-10-28 NOTE — Transfer of Care (Signed)
Immediate Anesthesia Transfer of Care Note  Patient: Jancie Doescher  Procedure(s) Performed: T12 and L3 KYPHOPLASTY RADIO FREQUENCY ABLATION OF BONE  Patient Location: PACU  Anesthesia Type:General  Level of Consciousness: drowsy and patient cooperative  Airway & Oxygen Therapy: Patient Spontanous Breathing  Post-op Assessment: Report given to RN and Post -op Vital signs reviewed and stable  Post vital signs: Reviewed and stable  Last Vitals:  Vitals Value Taken Time  BP 139/68 10/28/20 1140  Temp    Pulse 93 10/28/20 1141  Resp 16 10/28/20 1141  SpO2 97 % 10/28/20 1141  Vitals shown include unvalidated device data.  Last Pain:  Vitals:   10/28/20 0930  TempSrc: Oral  PainSc: 0-No pain         Complications: No notable events documented.

## 2020-10-28 NOTE — H&P (Signed)
Chief Complaint  Patient presents with   Compression fracture  fractures involving the  T2, T4, T5, T12, and L3 vertebral bodies seen on CT    History of the Present Illness: Hannah Mcdonald is a 65 y.o. female here today.   The patient presents for evaluation of predominantly back pain. The patient was referred by Dr. Delight Hoh. She has multiple myeloma. She recently had an MRI that showed multiple compression fractures. Her MRI of the lumbar and thoracic spine from 10/19/2020 from [facility] was reviewed, along with prior PET scan from 09/06/2020. These were all new. She has fractures at L3, T12 and then upper thoracic. She has been complaining of predominantly back pain, and comes in today to discuss possible kyphoplasty and OsteoCool with the history of myeloma, these being pathologic. There is extensive disease throughout her spine.  The patient states she has pain to her back. She locates her pain to the T12 and L3 area. She has been treated by Dr. Grayland Ormond since 07/15/2020. The patient notes the L3 areas was her initial site of pain. She states she initially she only had pain with movement. She now has pain with sitting still and getting out of bed in the morning. She was placed on extended release morphine to take twice a day, as well as Percocet or tramadol.  The patient states she finished her radiation, and chemotherapy is supposed to start on 10/02/2020. She is going to [place] on 11/15/2020 to have a bone marrow transplant. She reports she is getting a port on 10/27/2020. She has taken calcium for 25 years due to a wrist fracture. She is not on any blood thinners.  The patient is a retired [employer] scientist, working in [employer].  I have reviewed past medical, surgical, social and family history, and allergies as documented in the EMR.  Past Medical History: Past Medical History:  Diagnosis Date   Hyperlipidemia   Multiple myeloma myelomatosis (CMS-HCC)   Tachycardia   Type 2 diabetes  mellitus (CMS-HCC)   Past Surgical History: Past Surgical History:  Procedure Laterality Date   breast enhancement 1983   CESAREAN DELIVERY   ENDOMETRIAL ABLATION W/ NOVASURE   EYELID SURGERY Bilateral 09/01/2015  4 lid bleph with full face laser skin resurfacing   Past Family History: Family History  Problem Relation Age of Onset   Colon cancer Father 52   Diabetes Mother   Asthma Mother   Diabetes Brother   High blood pressure (Hypertension) Neg Hx   Glaucoma Neg Hx   Medications: Current Outpatient Medications Ordered in Epic  Medication Sig Dispense Refill   atorvastatin (LIPITOR) 20 MG tablet   busPIRone (BUSPAR) 10 MG tablet Take 30 mg by mouth 2 (two) times daily.   calcium carbonate (TUMS E-X) 300 mg (750 mg) chewable tablet Take 300 mg of elemental by mouth every 2 (two) hours as needed for Heartburn.   cyclobenzaprine (FLEXERIL) 10 MG tablet Take 10 mg by mouth 3 (three) times daily as needed for Muscle spasms.   glucosam-chondroitin-diet cb25 116-100 mg Cap Take by mouth.   meloxicam (MOBIC) 15 MG tablet Take 15 mg by mouth once daily.   metFORMIN (GLUCOPHAGE) 500 MG tablet   metoprolol succinate (TOPROL-XL) 50 MG XL tablet   morphine (MS CONTIN) 15 MG ER tablet   multivitamin tablet Take 1 tablet by mouth once daily.   omeprazole (PRILOSEC) 20 MG DR capsule   oxyCODONE-acetaminophen (PERCOCET) 5-325 mg tablet Take 1 tablet by mouth every 6 (six) hours  as needed for Pain   VICTOZA 3-PAK 0.6 mg/0.1 mL (18 mg/3 mL) pen injector Inject subcutaneously   acyclovir (ZOVIRAX) 400 MG tablet   dexAMETHasone (DECADRON) 4 MG tablet (Patient not taking: Reported on 10/22/2020)   magnesium oxide (MAG-OX) 400 mg (241.3 mg magnesium) tablet Take 1 tablet by mouth once daily (Patient not taking: Reported on 10/22/2020)   methylPREDNISolone (MEDROL DOSEPACK) 4 mg tablet methylprednisolone 4 mg tablets in a dose pack   omega-3 fatty acids/fish oil 340-1,000 mg capsule Take 1 capsule  by mouth 2 (two) times daily. (Patient not taking: Reported on 10/22/2020)   ondansetron (ZOFRAN) 8 MG tablet (Patient not taking: Reported on 10/22/2020)   ONGLYZA 5 mg tablet (Patient not taking: Reported on 10/22/2020)   prochlorperazine (COMPAZINE) 10 MG tablet   REVLIMID 25 mg capsule (Patient not taking: Reported on 10/22/2020)   traMADoL (ULTRAM) 50 mg tablet (Patient not taking: Reported on 10/22/2020)   No current Epic-ordered facility-administered medications on file.   Allergies: No Known Allergies   There is no height or weight on file to calculate BMI.  Review of Systems: A comprehensive 14 point ROS was performed, reviewed, and the pertinent orthopaedic findings are documented in the HPI.  There were no vitals filed for this visit.   General Physical Examination:   General/Constitutional: No apparent distress: well-nourished and well developed. Eyes: Pupils equal, round with synchronous movement. Lungs: Clear to auscultation HEENT: Normal Vascular: No edema, swelling or tenderness, except as noted in detailed exam. Cardiac: Heart rate and rhythm is regular. Integumentary: No impressive skin lesions present, except as noted in detailed exam. Neuro/Psych: Normal mood and affect, oriented to person, place, and time.  Musculoskeletal Examination:  On exam, no clonus bilaterally. No evidence of cord compression on the skin. Tenderness to T12 and L3. Skin intact.   Radiographs:  No new imaging studies were obtained today.  Assessment: ICD-10-CM  1. Compression fracture of lumbar spine, non-traumatic, initial encounter (CMS-HCC) M48.56XA  2. Thoracic compression fracture, closed, initial encounter (CMS-HCC) S22.000A   Plan:  The patient has clinical findings of multiple compression fractures of T12 and L3.  We discussed the patient's prior MRI and PET scan findings. I recommend kyphoplasty with OsteoCool procedure. I explained the procedure and postoperative course in  detail. I gave the patient a brochure on kyphoplasty. The patient watched a video on the OsteoCool procedure.   We will schedule the patient for kyphoplasty in the near future.  Surgical Risks:  The nature of the condition and the proposed procedure has been reviewed in detail with the patient. Surgical versus non-surgical options and prognosis for recovery have been reviewed and the inherent risks and benefits of each have been discussed including the risks of infection, bleeding, injury to nerves/blood vessels/tendons, incomplete relief of symptoms, persisting pain and/or stiffness, loss of function, complex regional pain syndrome, failure of the procedure, as appropriate.  Scribe Attestation: I, Dawn Royse, am acting as Education administrator for [employer], MD.  Answers for HPI/ROS submitted by the patient on 10/20/2020 Cyclobenzaprine (Flexeril): does not help Meloxicam (Mobic): does not help Morphine (MS Contin, MSIR): helps Oxycodone (Percocet): helps Tramadol (Ultram): helps How did your symptoms begin?: suddenly without injury How long ago did your symptoms begin?: 2 Weeks What is the cause of your spine pain?: unknown What is the location of your primary area of pain?: central lower back What other locations do you have pain?: right lower back, left lower back, middle back, upper neck, right  scapular, left scapular What best describes your pain? (choose all that apply): dull, stabbing How often does the pain occur?: constant with intermittent worsening Since the onset of your problem, has the pain improved, worsened, or stayed the same?: worsened Rate the pain on a scale of 0 (no pain) to 10 (worst pain ever): 9/10 Choose all the activities that make your pain worse: bending foward, coughing/sneezing, lying down, twisting/turning head Choose all the activities that make your pain feel better: rest, standing Choose all the activities that are affected by your pain/problem: bathing,  chores, dressing Please select all the medications that you have tried for this problem: muscle relaxants, NSAIDs (anti-inflammatories), pain meds Please choose all the studies you have had for this problem: CT scan, MRI   Electronically signed by Lauris Poag, MD at 10/25/2020 7:20 PM EDT   Reviewed  H+P. No changes noted.

## 2020-10-28 NOTE — Anesthesia Preprocedure Evaluation (Signed)
Anesthesia Evaluation  Patient identified by MRN, date of birth, ID band Patient awake    Reviewed: Allergy & Precautions, NPO status , Patient's Chart, lab work & pertinent test results  History of Anesthesia Complications Negative for: history of anesthetic complications  Airway Mallampati: III  TM Distance: <3 FB Neck ROM: full    Dental  (+) Chipped, Caps   Pulmonary neg shortness of breath, neg COPD, former smoker,    Pulmonary exam normal        Cardiovascular Exercise Tolerance: Good negative cardio ROS Normal cardiovascular exam     Neuro/Psych PSYCHIATRIC DISORDERS negative neurological ROS     GI/Hepatic Neg liver ROS, GERD  Medicated and Controlled,  Endo/Other  diabetes, Type 2  Renal/GU negative Renal ROS  negative genitourinary   Musculoskeletal   Abdominal   Peds  Hematology negative hematology ROS (+)   Anesthesia Other Findings Past Medical History: No date: Anxiety 2016: Diabetes mellitus without complication (HCC) No date: GERD (gastroesophageal reflux disease) No date: Hyperlipidemia No date: Personal history of colonic polyps 11/25/2013: Squamous cell carcinoma of skin     Comment:  Left chest. WD SCC with superficial infiltration. No date: Tachycardia  Past Surgical History: 1983: AUGMENTATION MAMMAPLASTY; Bilateral     Comment:  silicone and replacement in 2001 1983: [place]: [place], [id number], 2008, 2013: COLONOSCOPY 08/16/2016: COLONOSCOPY WITH PROPOFOL; N/A     Comment:  Procedure: COLONOSCOPY WITH PROPOFOL;  Surgeon: Robert Bellow, MD;  Location: [facility] ENDOSCOPY;  Service:               Endoscopy;  Laterality: N/A; No date: DILATION AND CURETTAGE OF UTERUS 12/1991: ENDOMETRIAL ABLATION No date: ganglion cyst removal  10/27/2020: IR IMAGING GUIDED PORT INSERTION No date: TONSILLECTOMY 05/1995: WRIST SURGERY;  Right  BMI    Body Mass Index: 27.00 kg/m      Reproductive/Obstetrics negative OB ROS                             Anesthesia Physical Anesthesia Plan  ASA: 3  Anesthesia Plan: General   Post-op Pain Management:    Induction: Intravenous  PONV Risk Score and Plan: Propofol infusion and TIVA  Airway Management Planned: Natural Airway and Nasal Cannula  Additional Equipment:   Intra-op Plan:   Post-operative Plan:   Informed Consent: I have reviewed the patients History and Physical, chart, labs and discussed the procedure including the risks, benefits and alternatives for the proposed anesthesia with the patient or authorized representative who has indicated his/her understanding and acceptance.     Dental Advisory Given  Plan Discussed with: Anesthesiologist, CRNA and Surgeon  Anesthesia Plan Comments: (Patient consented for risks of anesthesia including but not limited to:  - adverse reactions to medications - risk of airway placement if required - damage to eyes, teeth, lips or other oral mucosa - nerve damage due to positioning  - sore throat or hoarseness - Damage to heart, brain, nerves, lungs, other parts of body or loss of life  Patient voiced understanding.)        Anesthesia Quick Evaluation

## 2020-10-28 NOTE — Anesthesia Postprocedure Evaluation (Signed)
Anesthesia Post Note  Patient: Nikitia Gamino  Procedure(s) Performed: T12 and L3 KYPHOPLASTY RADIO FREQUENCY ABLATION OF BONE  Patient location during evaluation: PACU Anesthesia Type: General Level of consciousness: awake and alert Pain management: pain level controlled Vital Signs Assessment: post-procedure vital signs reviewed and stable Respiratory status: spontaneous breathing, nonlabored ventilation, respiratory function stable and patient connected to nasal cannula oxygen Cardiovascular status: blood pressure returned to baseline and stable Postop Assessment: no apparent nausea or vomiting Anesthetic complications: no   No notable events documented.   Last Vitals:  Vitals:   10/28/20 1215 10/28/20 1228  BP: 105/64 (!) 143/97  Pulse: 91 94  Resp: 17 18  Temp: 36.7 C (!) 36.2 C  SpO2: 98% 94%    Last Pain:  Vitals:   10/28/20 1228  TempSrc: Temporal  PainSc: 5                  Precious Haws Aviyanna Colbaugh

## 2020-10-28 NOTE — Discharge Instructions (Addendum)
Take it easy today and try to resume more normal activities tomorrow Pain medicine as previously directed Muscle relaxer as needed for muscle spasm Remove Band-Aids Saturday then okay to shower Call office if having problems  AMBULATORY SURGERY  DISCHARGE INSTRUCTIONS   The drugs that you were given will stay in your system until tomorrow so for the next 24 hours you should not:  Drive an automobile Make any legal decisions Drink any alcoholic beverage   You may resume regular meals tomorrow.  Today it is better to start with liquids and gradually work up to solid foods.  You may eat anything you prefer, but it is better to start with liquids, then soup and crackers, and gradually work up to solid foods.   Please notify your doctor immediately if you have any unusual bleeding, trouble breathing, redness and pain at the surgery site, drainage, fever, or pain not relieved by medication.    Additional Instructions:  Please contact your physician with any problems or Same Day Surgery at [phone], Monday through Friday 6 am to 4 pm, or [place] at [facility] number at [phone].

## 2020-10-28 NOTE — Op Note (Signed)
10/28/2020  11:38 AM  PATIENT:  Hannah Mcdonald   MRN: [id number]   PRE-OPERATIVE DIAGNOSIS:  closed wedge compression fracture of T12 and L3, secondary to multiple myeloma   POST-OPERATIVE DIAGNOSIS: Same  PROCEDURE:  Procedure(s): KYPHOPLASTY T12 and L3 with radiofrequency ablation of tumor at each level (osteocool)  SURGEON: Michael J Menz, MD   ASSISTANTS: None   ANESTHESIA:   local and MAC   EBL:  No intake/output data recorded.   BLOOD ADMINISTERED:none   DRAINS: none    LOCAL MEDICATIONS USED:  MARCAINE    and XYLOCAINE    SPECIMEN: T12 and L3 vertebral body biopsy   DISPOSITION OF SPECIMEN: Pathology   COUNTS:  YES   TOURNIQUET:  * No tourniquets in log *   IMPLANTS: Bone cement   DICTATION: .Dragon Dictation  patient was brought to the operating room and after adequate anesthesia was obtained the patient was placed prone.  C arm was brought in in good visualization of the affected level obtained on both AP and lateral projections.  After patient identification and timeout procedures were completed, local anesthetic was infiltrated with 10 cc 1% Xylocaine infiltrated subcutaneously.  This is done the area on the each side of the planned approach.  The back was then prepped and draped in the usual sterile manner and repeat timeout procedure carried out.  A spinal needle was brought down to the pedicle on the each side of T12 and L3 and a 50-50 mix of 1% Xylocaine half percent Sensorcaine with epinephrine total of 20 cc injected on each side.  After allowing this to set a small incision was made and the trocar was advanced into the vertebral body in an extrapedicular fashion.  Biopsy was obtained at each level.  Measurement was made and the osteochondral 15 mm probe was inserted on each side at T12 first and then when that was complete at L3 for 12 and half minutes of radiofrequency ablation at each level.  Drilling was carried out balloon inserted with inflation to 2.0 cc on  the right and 1.5 cc on the left at T12 and 3 cc at each side at L3.  When the cement was appropriate consistency 3 cc were injected on the right and 2-1/2 cc on the left into the vertebral body at T12, 4-1/2 on the right and 3-1/2 on the left at L3.,  without extravasation, good fill superior to inferior endplates and from right to left sides along the inferior endplate.  After the cement had set the trochar was removed and permanent C-arm views obtained.  The wound was closed with Dermabond followed by Band-Aid   PLAN OF CARE: Discharge home after recovery   PATIENT DISPOSITION:  PACU - hemodynamically stable.        

## 2020-10-29 LAB — SURGICAL PATHOLOGY

## 2020-10-29 NOTE — Progress Notes (Signed)
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  INTERVAL HISTORY: Patient returns to clinic today for further evaluation and consideration of cycle 1, day 1 of durvalumab, Velcade, Revlimid, and dexamethasone.  She reports her back pain initially improved after her kyphoplasty, but then had a fall and is now having increased pain.  She is reporting hand tremors as well.  She has increased weakness and fatigue.  She has no other neurologic complaints.  She denies any recent fevers.  She has no chest pain, shortness of breath, cough, or hemoptysis.  She denies any nausea, vomiting, constipation, or diarrhea.  She has no urinary complaints.  Patient offers no further specific complaints today.  REVIEW OF SYSTEMS:   Review of Systems  Constitutional:  Positive for malaise/fatigue. Negative for fever and weight loss.  Respiratory: Negative.  Negative for cough, hemoptysis and shortness of breath.   Cardiovascular: Negative.  Negative for chest pain and leg swelling.  Gastrointestinal: Negative.  Negative for abdominal pain.  Genitourinary: Negative.  Negative for dysuria.  Musculoskeletal:  Positive for back pain, falls and joint pain.  Skin: Negative.  Negative for rash.  Neurological:  Positive for tremors and weakness. Negative for dizziness, focal weakness and headaches.  Psychiatric/Behavioral: Negative.  The patient is not nervous/anxious.    As per HPI. Otherwise, a complete review of systems is negative.  PAST MEDICAL HISTORY: Past Medical History:  Diagnosis Date   Anxiety    Diabetes mellitus without complication ([place]) 3016   GERD (gastroesophageal reflux disease)    Hyperlipidemia     Personal history of colonic polyps    Squamous cell carcinoma of skin 11/25/2013   Left chest. WD SCC with superficial infiltration.   Tachycardia     PAST SURGICAL HISTORY: Past Surgical History:  Procedure Laterality Date   AUGMENTATION MAMMAPLASTY Bilateral 0109   silicone and replacement in 2001   [place], [place], 1998,2003, 2008, 2013   COLONOSCOPY WITH PROPOFOL N/A 08/16/2016   Procedure: COLONOSCOPY WITH PROPOFOL;  Surgeon: Robert Bellow, MD;  Location: [facility] ENDOSCOPY;  Service: Endoscopy;  Laterality: N/A;   DILATION AND CURETTAGE OF UTERUS     ENDOMETRIAL ABLATION  12/1991   ganglion cyst removal      IR IMAGING GUIDED PORT INSERTION  10/27/2020   KYPHOPLASTY N/A 10/28/2020   Procedure: T12 and L3 KYPHOPLASTY;  Surgeon: Hessie Knows, MD;  Location: [facility] ORS;  Service: Orthopedics;  Laterality: N/A;   TONSILLECTOMY     WRIST SURGERY Right 05/1995    FAMILY HISTORY: Family History  Problem Relation Age of Onset   Colon polyps Sister    Colon cancer Father 74   Diabetes Mother    Breast cancer Neg Hx     ADVANCED DIRECTIVES (Y/N):  N  HEALTH MAINTENANCE: Social History   Tobacco Use   Smoking status: Former    Packs/day: 1.00    Years: 4.00    Pack years: 4.00    Types: Cigarettes    Quit date: 1982    Years since quitting: 40.7   Smokeless tobacco: Never  Substance Use Topics   Alcohol use: Yes    Alcohol/week: 1.0 -  2.0 standard drink    Types: 1 - 2 Glasses of wine per week    Comment: occassionally   Drug use: No     Colonoscopy:  PAP:  Bone density:  Lipid panel:  No Known Allergies  Current Outpatient Medications  Medication Sig Dispense Refill   acyclovir (ZOVIRAX) 400 MG tablet Take 1 tablet (400 mg total) by mouth 2 (two) times daily. 60 tablet 5   atorvastatin (LIPITOR) 20 MG tablet Take 20 mg by mouth daily.     busPIRone (BUSPAR) 30 MG tablet Take 30 mg by mouth  2 (two) times daily.     CALCIUM-VITAMIN D PO Take 1 tablet by mouth daily.     cyclobenzaprine (FLEXERIL) 10 MG tablet Take 10 mg by mouth at bedtime.     lenalidomide (REVLIMID) 25 MG capsule Take 1 capsule (25 mg total) by mouth daily. Take for 14 days, then hold for 7 days. Repeat every 21 days. 14 capsule 0   lidocaine-prilocaine (EMLA) cream Apply 1 application topically as needed. 30 g 2   liraglutide (VICTOZA) 18 MG/3ML SOPN Inject 1.2 mg into the skin daily.     meloxicam (MOBIC) 15 MG tablet Take 15 mg by mouth daily.     metFORMIN (GLUCOPHAGE) 500 MG tablet Take 1,000 mg by mouth 2 (two) times daily with a meal.     methocarbamol (ROBAXIN) 500 MG tablet Take 1 tablet (500 mg total) by mouth every 6 (six) hours as needed for muscle spasms. 40 tablet 1   metoprolol succinate (TOPROL-XL) 50 MG 24 hr tablet Take 50 mg by mouth 2 (two) times daily. Take with or immediately following a meal.     Misc Natural Products (GLUCOSAMINE CHOND CMP TRIPLE) TABS Take 1 tablet by mouth daily.     morphine (MS CONTIN) 15 MG 12 hr tablet Take 1 tablet (15 mg total) by mouth every 12 (twelve) hours. 60 tablet 0   Multiple Vitamin (MULTIVITAMIN WITH MINERALS) TABS tablet Take 1 tablet by mouth daily.     omeprazole (PRILOSEC) 20 MG capsule Take 20 mg by mouth daily.     ondansetron (ZOFRAN) 8 MG tablet Take 1 tablet (8 mg total) by mouth 2 (two) times daily as needed (Nausea or vomiting). 60 tablet 2   oxyCODONE-acetaminophen (PERCOCET/ROXICET) 5-325 MG tablet Take 1-2 tablets by mouth every 4 (four) hours as needed for severe pain. 90 tablet 0   prochlorperazine (COMPAZINE) 10 MG tablet Take 1 tablet (10 mg total) by mouth every 6 (six) hours as needed (Nausea or vomiting). 60 tablet 2   traMADol (ULTRAM) 50 MG tablet Take 1 tablet (50 mg total) by mouth 2 (two) times daily as needed for moderate pain. 60 tablet 0   ASPIRIN 81 PO Take 81 mg by mouth daily.     dexamethasone (DECADRON) 4 MG tablet Take 5  tabs (20 mg) weekly the day after daratumumab for 12 weeks. Take with breakfast.     No current facility-administered medications for this visit.    OBJECTIVE: Vitals:   11/02/20 1010 11/02/20 1131  BP: 110/70 118/61  Pulse: 80 90  Resp: 18 18  Temp: 98.3 F (36.8 C)   SpO2: 94%      Body mass index is 26.76 kg/m.    ECOG FS:1 - Symptomatic but completely ambulatory  General: Well-developed, well-nourished, no acute distress. Eyes: Pink conjunctiva, anicteric sclera. HEENT: Normocephalic, moist mucous membranes. Lungs: No audible wheezing or coughing. Heart: Regular rate and rhythm. Abdomen:  Soft, nontender, no obvious distention. Musculoskeletal: No edema, cyanosis, or clubbing. Neuro: Alert, answering all questions appropriately. Cranial nerves grossly intact. Skin: No rashes or petechiae noted. Psych: Normal affect.   LAB RESULTS:  Lab Results  Component Value Date   NA 136 11/02/2020   K 3.4 (L) 11/02/2020   CL 94 (L) 11/02/2020   CO2 31 11/02/2020   GLUCOSE 118 (H) 11/02/2020   BUN 23 11/02/2020   CREATININE 1.67 (H) 11/02/2020   CALCIUM 13.8 (HH) 11/02/2020   PROT 6.6 11/02/2020   ALBUMIN 3.9 11/02/2020   AST 27 11/02/2020   ALT 20 11/02/2020   ALKPHOS 92 11/02/2020   BILITOT 0.4 11/02/2020   GFRNONAA 34 (L) 11/02/2020    Lab Results  Component Value Date   WBC 4.2 11/02/2020   NEUTROABS 3.2 11/02/2020   HGB 9.6 (L) 11/02/2020   HCT 29.4 (L) 11/02/2020   MCV 98.0 11/02/2020   PLT 191 11/02/2020     STUDIES: DG Lumbar Spine 2-3 Views  Result Date: 10/28/2020 CLINICAL DATA:  Surgery, elective Z41.9 (ICD-10-CM). Additional history provided: Kyphoplasty T12 and L3. Provided fluoroscopy time 3 minutes, 17 seconds (76.27 mGy). EXAM: LUMBAR SPINE - 2-3 VIEW; DG C-ARM 1-60 MIN COMPARISON:  Lumbar spine MRI 10/19/2020. FINDINGS: Two AP intraprocedural fluoroscopic images of the thoracolumbar spine are submitted. On the initial provided image, kyphoplasty  material is present within the T12 vertebral body at site of a known compression fracture. On the second provided image, kyphoplasty material is demonstrated within a lumbar vertebral body (presumably the L3 vertebral body given the provided history), although the exact level is difficult to ascertain given the provided field of view. IMPRESSION: Two intraprocedural fluoroscopic images of the thoracolumbar spine from reported T12 and L3 kyphoplasty, as described. Electronically Signed   By: Kellie Simmering D.O.   On: 10/28/2020 14:08   MR Thoracic Spine W Wo Contrast  Result Date: 10/19/2020 CLINICAL DATA:  Initial evaluation for 2-3 weeks of central, right and left-sided mid back pain extending to the ribs and lumbar spine. History of recent fall on 07/08/2020. History of lambda light chain multiple myeloma. EXAM: MRI THORACIC AND LUMBAR SPINE WITHOUT AND WITH CONTRAST TECHNIQUE: Multiplanar and multiecho pulse sequences of the thoracic and lumbar spine were obtained without and with intravenous contrast. CONTRAST:  35m GADAVIST GADOBUTROL 1 MMOL/ML IV SOLN COMPARISON:  Prior PET-CT from 08/27/2020. FINDINGS: MRI THORACIC SPINE FINDINGS Alignment: Trace levoscoliosis. Alignment otherwise normal with preservation of the normal thoracic kyphosis. No listhesis. Vertebrae: Diffusely abnormal appearance of the visualized bone marrow with innumerable T1 hypointense, stir hyperintense enhancing lesions within the thoracic spine, consistent with history of multiple myeloma. There is involvement of the centrally all levels. For reference purposes, the largest discrete lesion is seen at the central posterior aspect of T10 and measures 1.3 cm (series 17, image 9). Minimal convex bowing of the posterior vertebral margin at this level without frank epidural extension (series 19, image 9). Small focus of extra osseous extension measuring 1.5 cm seen at the right anterolateral aspect of the T9-10 level (series 24, image 5). No  other significant extra osseous extension. No epidural tumor. Multiple pathologic fractures are seen as follows; - Acute to subacute fracture involving the superior endplate of T2 without significant height loss or bony retropulsion (series 19, image 9). - Acute to subacute compression fracture involving the superior endplate of T4 with 216%anterior height loss without bony retropulsion (series 19, image 9). - Acute to subacute compression fracture  involving the superior endplate of T5 with up to 30% central height loss without significant bony retropulsion (series 19, image 9). - Acute to subacute compression fracture involving the superior endplate of W10 with mild 25% height loss with trace 2 mm bony retropulsion (series 19, image 9). Involvement of the left posterior eighth rib with suspected pathologic fracture noted as well (series 22, image 25). Few additional scattered subcentimeter lesions noted within the ribs elsewhere. Cord: Normal signal and morphology. No significant epidural tumor or abnormal enhancement. Paraspinal and other soft tissues: Paraspinous soft tissues demonstrate no acute or significant finding. Disc levels: T1-2: Minimal disc bulge.  No stenosis. T3-4: Minimal disc bulge with superimposed left foraminal disc protrusion (series 17, image 10). No significant stenosis. T4-5: Tiny right paracentral disc protrusion without stenosis. C6-7: Mild disc bulge.  No significant stenosis. T7-8: Mild disc bulge, eccentric to the right. No significant stenosis. T11-12: Minimal disc bulge with trace 2 mm bony retropulsion related to the T12 fracture. No significant stenosis. MRI LUMBAR SPINE FINDINGS Segmentation: Standard. Lowest well-formed disc space labeled the L5-S1 level. Alignment: 6 mm anterolisthesis of L4 on L5, with additional trace anterolisthesis of L2 on L3 and L3 on L4. Findings chronic and facet mediated. Underlying trace dextroscoliosis. Vertebrae: Multiple marrow replacing lesions seen  throughout the lumbar spine, consistent with history of multiple myeloma. There is involvement of essentially all levels. Involvement of the visualized sacrum and pelvis as well. The dominant lesion is seen involving the right iliac crest, partially visualize, but measuring approximately 6.6 x 4.2 cm (series 4, image 28). Associated extraosseous extension into the adjacent iliacus and gluteal musculature. No other significant extra osseous extension of tumor. A 1.4 cm lesion involving the anterior aspect of L3 demonstrates a trabeculated morphology and is somewhat indeterminate, and could reflect an atypical hemangioma. Associated pathologic compression fracture involving the right aspect of the superior endplate of L3 with no more than mild 10% height loss without bony retropulsion. Vertebral body height otherwise maintained with no other fracture identified. Conus medullaris: Extends to the L1 level and appears normal. No epidural tumor or abnormal enhancement. Paraspinal and other soft tissues: Paraspinous soft tissues otherwise unremarkable. Retroaortic left renal vein noted. Visualized visceral structures otherwise unremarkable. Disc levels: L1-2:  Negative interspace.  Mild facet hypertrophy.  No stenosis. L2-3: Mild disc bulge with disc desiccation. Mild bilateral facet hypertrophy. No significant spinal stenosis. Foramina remain patent. L3-4: Trace anterolisthesis. Mild disc bulge with disc desiccation. Superimposed broad-based left foraminal disc protrusion closely approximates the exiting left L3 nerve root (series 4, image 22). Mild bilateral facet hypertrophy. No significant spinal stenosis. Foramina remain patent. L4-5: 6 mm anterolisthesis. Degenerative intervertebral disc space narrowing with disc desiccation and diffuse disc bulge. Moderate bilateral facet arthrosis. Resultant moderate spinal stenosis. Moderate right with mild left L4 foraminal narrowing. L5-S1: Mild disc bulge. Mild bilateral facet  hypertrophy. No significant stenosis. IMPRESSION: 1. Multiple marrow replacing lesions scattered throughout the thoracic and lumbar spine, consistent with history of multiple myeloma. 2. Associated acute to subacute pathologic fractures involving the T2, T4, T5, T12, and L3 vertebral bodies as above. No significant bony retropulsion or associated stenosis. 3. Focal pathologic fracture involving the left posterior eighth rib. 4. Small amount of extra osseous extension of tumor along the right anterolateral aspect of the vertebral column at the level of T9-10. Additional local extraosseous extension about a large 6.6 cm lesion involving the right iliac crest. No other significant extra osseous extension at this time. No  epidural or intracanalicular involvement. 5. 6 mm facet mediated anterolisthesis of L4 on L5 with resultant moderate canal with mild to moderate right worse than left L4 foraminal stenosis. 6. Additional more mild multilevel spondylosis elsewhere throughout the thoracolumbar spine as above. No other significant stenosis or neural impingement. Electronically Signed   By: Jeannine Boga M.D.   On: 10/19/2020 22:05   MR Lumbar Spine W Wo Contrast  Result Date: 10/19/2020 CLINICAL DATA:  Initial evaluation for 2-3 weeks of central, right and left-sided mid back pain extending to the ribs and lumbar spine. History of recent fall on 07/08/2020. History of lambda light chain multiple myeloma. EXAM: MRI THORACIC AND LUMBAR SPINE WITHOUT AND WITH CONTRAST TECHNIQUE: Multiplanar and multiecho pulse sequences of the thoracic and lumbar spine were obtained without and with intravenous contrast. CONTRAST:  84m GADAVIST GADOBUTROL 1 MMOL/ML IV SOLN COMPARISON:  Prior PET-CT from 08/27/2020. FINDINGS: MRI THORACIC SPINE FINDINGS Alignment: Trace levoscoliosis. Alignment otherwise normal with preservation of the normal thoracic kyphosis. No listhesis. Vertebrae: Diffusely abnormal appearance of the  visualized bone marrow with innumerable T1 hypointense, stir hyperintense enhancing lesions within the thoracic spine, consistent with history of multiple myeloma. There is involvement of the centrally all levels. For reference purposes, the largest discrete lesion is seen at the central posterior aspect of T10 and measures 1.3 cm (series 17, image 9). Minimal convex bowing of the posterior vertebral margin at this level without frank epidural extension (series 19, image 9). Small focus of extra osseous extension measuring 1.5 cm seen at the right anterolateral aspect of the T9-10 level (series 24, image 5). No other significant extra osseous extension. No epidural tumor. Multiple pathologic fractures are seen as follows; - Acute to subacute fracture involving the superior endplate of T2 without significant height loss or bony retropulsion (series 19, image 9). - Acute to subacute compression fracture involving the superior endplate of T4 with 284%anterior height loss without bony retropulsion (series 19, image 9). - Acute to subacute compression fracture involving the superior endplate of T5 with up to 30% central height loss without significant bony retropulsion (series 19, image 9). - Acute to subacute compression fracture involving the superior endplate of TT36with mild 25% height loss with trace 2 mm bony retropulsion (series 19, image 9). Involvement of the left posterior eighth rib with suspected pathologic fracture noted as well (series 22, image 25). Few additional scattered subcentimeter lesions noted within the ribs elsewhere. Cord: Normal signal and morphology. No significant epidural tumor or abnormal enhancement. Paraspinal and other soft tissues: Paraspinous soft tissues demonstrate no acute or significant finding. Disc levels: T1-2: Minimal disc bulge.  No stenosis. T3-4: Minimal disc bulge with superimposed left foraminal disc protrusion (series 17, image 10). No significant stenosis. T4-5: Tiny  right paracentral disc protrusion without stenosis. C6-7: Mild disc bulge.  No significant stenosis. T7-8: Mild disc bulge, eccentric to the right. No significant stenosis. T11-12: Minimal disc bulge with trace 2 mm bony retropulsion related to the T12 fracture. No significant stenosis. MRI LUMBAR SPINE FINDINGS Segmentation: Standard. Lowest well-formed disc space labeled the L5-S1 level. Alignment: 6 mm anterolisthesis of L4 on L5, with additional trace anterolisthesis of L2 on L3 and L3 on L4. Findings chronic and facet mediated. Underlying trace dextroscoliosis. Vertebrae: Multiple marrow replacing lesions seen throughout the lumbar spine, consistent with history of multiple myeloma. There is involvement of essentially all levels. Involvement of the visualized sacrum and pelvis as well. The dominant lesion is seen involving the  right iliac crest, partially visualize, but measuring approximately 6.6 x 4.2 cm (series 4, image 28). Associated extraosseous extension into the adjacent iliacus and gluteal musculature. No other significant extra osseous extension of tumor. A 1.4 cm lesion involving the anterior aspect of L3 demonstrates a trabeculated morphology and is somewhat indeterminate, and could reflect an atypical hemangioma. Associated pathologic compression fracture involving the right aspect of the superior endplate of L3 with no more than mild 10% height loss without bony retropulsion. Vertebral body height otherwise maintained with no other fracture identified. Conus medullaris: Extends to the L1 level and appears normal. No epidural tumor or abnormal enhancement. Paraspinal and other soft tissues: Paraspinous soft tissues otherwise unremarkable. Retroaortic left renal vein noted. Visualized visceral structures otherwise unremarkable. Disc levels: L1-2:  Negative interspace.  Mild facet hypertrophy.  No stenosis. L2-3: Mild disc bulge with disc desiccation. Mild bilateral facet hypertrophy. No significant  spinal stenosis. Foramina remain patent. L3-4: Trace anterolisthesis. Mild disc bulge with disc desiccation. Superimposed broad-based left foraminal disc protrusion closely approximates the exiting left L3 nerve root (series 4, image 22). Mild bilateral facet hypertrophy. No significant spinal stenosis. Foramina remain patent. L4-5: 6 mm anterolisthesis. Degenerative intervertebral disc space narrowing with disc desiccation and diffuse disc bulge. Moderate bilateral facet arthrosis. Resultant moderate spinal stenosis. Moderate right with mild left L4 foraminal narrowing. L5-S1: Mild disc bulge. Mild bilateral facet hypertrophy. No significant stenosis. IMPRESSION: 1. Multiple marrow replacing lesions scattered throughout the thoracic and lumbar spine, consistent with history of multiple myeloma. 2. Associated acute to subacute pathologic fractures involving the T2, T4, T5, T12, and L3 vertebral bodies as above. No significant bony retropulsion or associated stenosis. 3. Focal pathologic fracture involving the left posterior eighth rib. 4. Small amount of extra osseous extension of tumor along the right anterolateral aspect of the vertebral column at the level of T9-10. Additional local extraosseous extension about a large 6.6 cm lesion involving the right iliac crest. No other significant extra osseous extension at this time. No epidural or intracanalicular involvement. 5. 6 mm facet mediated anterolisthesis of L4 on L5 with resultant moderate canal with mild to moderate right worse than left L4 foraminal stenosis. 6. Additional more mild multilevel spondylosis elsewhere throughout the thoracolumbar spine as above. No other significant stenosis or neural impingement. Electronically Signed   By: Jeannine Boga M.D.   On: 10/19/2020 22:05   DG C-Arm 1-60 Min  Result Date: 10/28/2020 CLINICAL DATA:  Surgery, elective Z41.9 (ICD-10-CM). Additional history provided: Kyphoplasty T12 and L3. Provided fluoroscopy  time 3 minutes, 17 seconds (76.27 mGy). EXAM: LUMBAR SPINE - 2-3 VIEW; DG C-ARM 1-60 MIN COMPARISON:  Lumbar spine MRI 10/19/2020. FINDINGS: Two AP intraprocedural fluoroscopic images of the thoracolumbar spine are submitted. On the initial provided image, kyphoplasty material is present within the T12 vertebral body at site of a known compression fracture. On the second provided image, kyphoplasty material is demonstrated within a lumbar vertebral body (presumably the L3 vertebral body given the provided history), although the exact level is difficult to ascertain given the provided field of view. IMPRESSION: Two intraprocedural fluoroscopic images of the thoracolumbar spine from reported T12 and L3 kyphoplasty, as described. Electronically Signed   By: Kellie Simmering D.O.   On: 10/28/2020 14:08   IR IMAGING GUIDED PORT INSERTION  Result Date: 10/27/2020 INDICATION: Myeloma EXAM: IMPLANTED PORT A CATH PLACEMENT WITH ULTRASOUND AND FLUOROSCOPIC GUIDANCE MEDICATIONS: None ANESTHESIA/SEDATION: Moderate (conscious) sedation was employed during this procedure. A total of Versed 2 mg  and Fentanyl 100 mcg was administered intravenously. Moderate Sedation Time: 16 minutes. The patient's level of consciousness and vital signs were monitored continuously by radiology nursing throughout the procedure under my direct supervision. FLUOROSCOPY TIME:  0 minutes, 48 seconds (4.1 mGy) COMPLICATIONS: None immediate. PROCEDURE: The procedure, risks, benefits, and alternatives were explained to the patient. Questions regarding the procedure were encouraged and answered. The patient understands and consents to the procedure. A timeout was performed prior to the initiation of the procedure. Patient positioned supine on the angiography table. Right neck and anterior upper chest prepped and draped in the usual sterile fashion. All elements of maximal sterile barrier were utilized including, cap, mask, sterile gown, sterile gloves, large  sterile drape, hand scrubbing and 2% Chlorhexidine for skin cleaning. The right internal jugular vein was evaluated with ultrasound and shown to be patent. A permanent ultrasound image was obtained and placed in the patient's medical record. Local anesthesia was provided with 1% lidocaine with epinephrine. Using sterile gel and a sterile probe cover, the right internal jugular vein was entered with a 21 ga needle during real time ultrasound guidance. 0.018 inch guidewire placed and 21 ga needle exchanged for transitional dilator set. Utilizing fluoroscopy, 0.035 inch guidewire advanced through the needle without difficulty. Attention then turned to the right anterior upper chest. Following local lidocaine administration, a port pocket was created. The catheter was connected to the port and brought from the pocket to the venotomy site through a subcutaneous tunnel. The catheter was cut to size and inserted through the peel-away sheath. The catheter tip was positioned at the cavoatrial junction using fluoroscopic guidance. The port aspirated and flushed well. The port pocket was closed with deep and superficial absorbable suture. The port pocket incision and venotomy sites were also sealed with Dermabond. IMPRESSION: Successful placement of a right internal jugular approach power injectable Port-A-Cath. The catheter is ready for immediate use. Electronically Signed   By: Miachel Roux M.D.   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  PLAN:      1.  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  She also has elevated lambda free light chains of 432.1.  Immunoglobulins and SPEP are normal.  Bone marrow biopsy completed on August 26, 2020 revealed 25% plasma cells along with the deletion of 1p which is considered poor prognosis.  PET scan results from September 06, 2020 reviewed independently with 3 hypermetabolic lesions in right C5-6  facets, right iliac crest lesion, and L3 vertebral lesion.  After lengthy discussion with the patient, she agreed to pursue chemotherapy with daratumumab, Velcade, Revlimid, and dexamethasone followed by autologous bone marrow transplant.  Patient will receive weekly daratumumab for the first 3-4 cycles along with Velcade on days 1, 4, 8, and 11.  She will receive Revlimid on days 1 through 14 of a 21-day cycle.  Plan to do 4-6 cycles prior to autologous bone cell transplant at [facility].  Her initial appointment at [facility] is on November 15, 2020.  Proceed with cycle 1, day 1 of treatment today.  Return to clinic on Friday for cycle 1, day 8 which is Velcade only.  Patient then return to clinic in 1 week for further evaluation and consideration of cycle 1, day 8. 2.  Elevated liver enzymes: Mild, monitor.  Liver anatomy appears to be within normal limits on imaging. 3.  Pain: Patient has now  completed XRT.  She had kyphoplasty x2, but after her fall recently pain is worse.  Continue current narcotics. 4.  Hypercalcemia: Proceed with Zometa today.  This is also likely the cause of her tremors and increased weakness and fatigue.  I spent a total of 30 minutes reviewing chart data, face-to-face evaluation with the patient, counseling and coordination of care as detailed above.    Patient expressed understanding and was in agreement with this plan. She also understands that She can call clinic at any time with any questions, concerns, or complaints.   Cancer Staging Lambda light chain myeloma ([place]) Staging form: Plasma Cell Myeloma and Plasma Cell Disorders, AJCC 8th Edition - Clinical stage from 09/16/2020: RISS Stage I (Beta-2-microglobulin (mg/L): 2.5, Albumin (g/dL): 4.9, ISS: Stage I, High-risk cytogenetics: Absent, LDH: Normal) - Signed by Lloyd Huger, MD on 09/16/2020 Beta 2 microglobulin range (mg/L): Less than 3.5 Albumin range (g/dL): Greater than or equal to 3.5 Cytogenetics: 1p  deletion  Lloyd Huger, MD   11/04/2020 5:50 AM

## 2020-10-30 ENCOUNTER — Encounter: Payer: Self-pay | Admitting: Oncology

## 2020-11-01 ENCOUNTER — Other Ambulatory Visit: Payer: Self-pay | Admitting: Oncology

## 2020-11-01 MED ORDER — LIDOCAINE-PRILOCAINE 2.5-2.5 % EX CREA: 1.0000 "application " | TOPICAL_CREAM | CUTANEOUS | 2 refills | Status: DC | PRN

## 2020-11-01 MED FILL — LIDO/PRILOCN 2.5-2.5% CRE: 30 days supply | Qty: 30

## 2020-11-02 ENCOUNTER — Other Ambulatory Visit: Payer: BC Managed Care – PPO

## 2020-11-02 ENCOUNTER — Inpatient Hospital Stay: Payer: BC Managed Care – PPO | Attending: Oncology

## 2020-11-02 ENCOUNTER — Inpatient Hospital Stay: Payer: BC Managed Care – PPO | Admitting: Pharmacist

## 2020-11-02 ENCOUNTER — Ambulatory Visit: Payer: BC Managed Care – PPO | Admitting: Pharmacist

## 2020-11-02 ENCOUNTER — Ambulatory Visit: Payer: BC Managed Care – PPO

## 2020-11-02 ENCOUNTER — Ambulatory Visit: Payer: BC Managed Care – PPO | Admitting: Oncology

## 2020-11-02 ENCOUNTER — Inpatient Hospital Stay (HOSPITAL_BASED_OUTPATIENT_CLINIC_OR_DEPARTMENT_OTHER): Payer: BC Managed Care – PPO | Admitting: Oncology

## 2020-11-02 ENCOUNTER — Inpatient Hospital Stay: Payer: BC Managed Care – PPO

## 2020-11-02 VITALS — BP 118/61 | HR 90 | Temp 98.3°F | Resp 18 | Wt 146.3 lb

## 2020-11-02 DIAGNOSIS — Z79899 Other long term (current) drug therapy: Secondary | ICD-10-CM | POA: Diagnosis not present

## 2020-11-02 DIAGNOSIS — C9000 Multiple myeloma not having achieved remission: Secondary | ICD-10-CM

## 2020-11-02 DIAGNOSIS — Z923 Personal history of irradiation: Secondary | ICD-10-CM | POA: Diagnosis not present

## 2020-11-02 DIAGNOSIS — Z5112 Encounter for antineoplastic immunotherapy: Secondary | ICD-10-CM | POA: Insufficient documentation

## 2020-11-02 DIAGNOSIS — Z95828 Presence of other vascular implants and grafts: Secondary | ICD-10-CM

## 2020-11-02 LAB — COMPREHENSIVE METABOLIC PANEL
ALT: 20 U/L (ref 0–44)
AST: 27 U/L (ref 15–41)
Albumin: 3.9 g/dL (ref 3.5–5.0)
Alkaline Phosphatase: 92 U/L (ref 38–126)
Anion gap: 11 (ref 5–15)
BUN: 23 mg/dL (ref 8–23)
CO2: 31 mmol/L (ref 22–32)
Calcium: 13.8 mg/dL (ref 8.9–10.3)
Chloride: 94 mmol/L — ABNORMAL LOW (ref 98–111)
Creatinine, Ser: 1.67 mg/dL — ABNORMAL HIGH (ref 0.44–1.00)
GFR, Estimated: 34 mL/min — ABNORMAL LOW (ref >60)
Glucose, Bld: 118 mg/dL — ABNORMAL HIGH (ref 70–99)
Potassium: 3.4 mmol/L — ABNORMAL LOW (ref 3.5–5.1)
Sodium: 136 mmol/L (ref 135–145)
Total Bilirubin: 0.4 mg/dL (ref 0.3–1.2)
Total Protein: 6.6 g/dL (ref 6.5–8.1)

## 2020-11-02 LAB — TYPE AND SCREEN
ABO/RH(D): O POS
Antibody Screen: NEGATIVE

## 2020-11-02 LAB — CBC WITH DIFFERENTIAL/PLATELET
Abs Immature Granulocytes: 0.03 10*3/uL (ref 0.00–0.07)
Basophils Absolute: 0 10*3/uL (ref 0.0–0.1)
Basophils Relative: 1 %
Eosinophils Absolute: 0.1 10*3/uL (ref 0.0–0.5)
Eosinophils Relative: 2 %
HCT: 29.4 % — ABNORMAL LOW (ref 36.0–46.0)
Hemoglobin: 9.6 g/dL — ABNORMAL LOW (ref 12.0–15.0)
Immature Granulocytes: 1 %
Lymphocytes Relative: 12 %
Lymphs Abs: 0.5 10*3/uL — ABNORMAL LOW (ref 0.7–4.0)
MCH: 32 pg (ref 26.0–34.0)
MCHC: 32.7 g/dL (ref 30.0–36.0)
MCV: 98 fL (ref 80.0–100.0)
Monocytes Absolute: 0.4 10*3/uL (ref 0.1–1.0)
Monocytes Relative: 10 %
Neutro Abs: 3.2 10*3/uL (ref 1.7–7.7)
Neutrophils Relative %: 74 %
Platelets: 191 10*3/uL (ref 150–400)
RBC: 3 MIL/uL — ABNORMAL LOW (ref 3.87–5.11)
RDW: 12.6 % (ref 11.5–15.5)
WBC: 4.2 10*3/uL (ref 4.0–10.5)
nRBC: 0 % (ref 0.0–0.2)

## 2020-11-02 MED ORDER — DIPHENHYDRAMINE HCL 25 MG PO CAPS
25.0000 mg | ORAL_CAPSULE | Freq: Once | ORAL | Status: AC
  Administered 2020-11-02: 25 mg via ORAL

## 2020-11-02 MED ORDER — DEXAMETHASONE 4 MG PO TABS
20.0000 mg | ORAL_TABLET | Freq: Once | ORAL | Status: AC
  Administered 2020-11-02: 20 mg via ORAL

## 2020-11-02 MED ORDER — ZOLEDRONIC ACID 4 MG/5ML IV CONC
3.0000 mg | Freq: Once | INTRAVENOUS | Status: AC
  Administered 2020-11-02: 3 mg via INTRAVENOUS
  Filled 2020-11-02: qty 3.75

## 2020-11-02 MED ORDER — DARATUMUMAB-HYALURONIDASE-FIHJ 1800-30000 MG-UT/15ML ~~LOC~~ SOLN
1800.0000 mg | Freq: Once | SUBCUTANEOUS | Status: AC
  Administered 2020-11-02: 1800 mg via SUBCUTANEOUS
  Filled 2020-11-02: qty 15

## 2020-11-02 MED ORDER — ACETAMINOPHEN 325 MG PO TABS
650.0000 mg | ORAL_TABLET | Freq: Once | ORAL | Status: AC
  Administered 2020-11-02: 650 mg via ORAL

## 2020-11-02 MED ORDER — MONTELUKAST SODIUM 10 MG PO TABS
10.0000 mg | ORAL_TABLET | Freq: Once | ORAL | Status: AC
  Administered 2020-11-02: 10 mg via ORAL

## 2020-11-02 MED ORDER — HEPARIN SOD (PORK) LOCK FLUSH 100 UNIT/ML IV SOLN
INTRAVENOUS | Status: AC
  Administered 2020-11-02: 500 [IU] via INTRAVENOUS
  Filled 2020-11-02: qty 5

## 2020-11-02 MED ORDER — SODIUM CHLORIDE 0.9 % IV SOLN
Freq: Once | INTRAVENOUS | Status: AC
  Filled 2020-11-02: qty 250

## 2020-11-02 MED ORDER — HEPARIN SOD (PORK) LOCK FLUSH 100 UNIT/ML IV SOLN
500.0000 [IU] | Freq: Once | INTRAVENOUS | Status: AC
  Filled 2020-11-02: qty 5

## 2020-11-02 MED ORDER — BORTEZOMIB CHEMO SQ INJECTION 3.5 MG (2.5MG/ML)
1.3000 mg/m2 | Freq: Once | INTRAMUSCULAR | Status: AC
  Administered 2020-11-02: 2.25 mg via SUBCUTANEOUS
  Filled 2020-11-02: qty 0.9

## 2020-11-02 MED ORDER — HEPARIN SOD (PORK) LOCK FLUSH 100 UNIT/ML IV SOLN
500.0000 [IU] | Freq: Once | INTRAVENOUS | Status: DC
  Filled 2020-11-02: qty 5

## 2020-11-02 NOTE — Progress Notes (Signed)
[facility]  [phone] [phone]  Patient Care Team: Albina Billet, MD as PCP - General (Internal Medicine) Bary Castilla Forest Gleason, MD as Consulting Physician (General Surgery)   Name of the patient: Hannah Mcdonald  [id number]  May 16, 1955   Date of visit: 11/02/20  HPI: Patient is a 65 y.o. female with newly diagnosed multiple myeloma. Today she will begin treatment with Dara-VRd. She has an upcoming appt on 9/19 for transplant evaluation at [facility].  Reason for Consult: Revlimid (lenalidomide) oral chemotherapy education.   PAST MEDICAL HISTORY: Past Medical History:  Diagnosis Date   Anxiety    Diabetes mellitus without complication ([place]) 1093   GERD (gastroesophageal reflux disease)    Hyperlipidemia    Personal history of colonic polyps    Squamous cell carcinoma of skin 11/25/2013   Left chest. WD SCC with superficial infiltration.   Tachycardia     HEMATOLOGY/ONCOLOGY HISTORY:  Oncology History  Lambda light chain myeloma ([place])  08/24/2020 Initial Diagnosis   Lambda light chain myeloma ([place])   09/16/2020 Cancer Staging   Staging form: Plasma Cell Myeloma and Plasma Cell Disorders, AJCC 8th Edition - Clinical stage from 09/16/2020: RISS Stage I (Beta-2-microglobulin (mg/L): 2.5, Albumin (g/dL): 4.9, ISS: Stage I, High-risk cytogenetics: Absent, LDH: Normal) - Signed by Lloyd Huger, MD on 09/16/2020 Beta 2 microglobulin range (mg/L): Less than 3.5 Albumin range (g/dL): Greater than or equal to 3.5 Cytogenetics: 1p deletion   11/02/2020 -  Chemotherapy    Patient is on Treatment Plan: MYELOMA NEWLY DIAGNOSED TRANSPLANT CANDIDATE DARAVRD (DARATUMUMAB SQ) Q21D X 6 CYCLES (INDUCTION/CONSOLIDATION)         ALLERGIES:  has No Known Allergies.  MEDICATIONS:  Current Outpatient Medications  Medication Sig Dispense Refill   acyclovir (ZOVIRAX) 400 MG tablet Take 1 tablet (400 mg total) by mouth 2  (two) times daily. 60 tablet 5   atorvastatin (LIPITOR) 20 MG tablet Take 20 mg by mouth daily.     busPIRone (BUSPAR) 30 MG tablet Take 30 mg by mouth 2 (two) times daily.     CALCIUM-VITAMIN D PO Take 1 tablet by mouth daily.     cyclobenzaprine (FLEXERIL) 10 MG tablet Take 10 mg by mouth at bedtime.     dexamethasone (DECADRON) 4 MG tablet Take 5 tabs (20 mg) weekly the day after daratumumab for 12 weeks. Then take on the day after daratumumab and daily x 2 days on non-chemotherapy weeks for 6 weeks. Take with breakfast. 20 tablet 5   lenalidomide (REVLIMID) 25 MG capsule Take 1 capsule (25 mg total) by mouth daily. Take for 14 days, then hold for 7 days. Repeat every 21 days. 14 capsule 0   lidocaine-prilocaine (EMLA) cream Apply 1 application topically as needed. 30 g 2   liraglutide (VICTOZA) 18 MG/3ML SOPN Inject 1.2 mg into the skin daily.     meloxicam (MOBIC) 15 MG tablet Take 15 mg by mouth daily.     metFORMIN (GLUCOPHAGE) 500 MG tablet Take 1,000 mg by mouth 2 (two) times daily with a meal.     methocarbamol (ROBAXIN) 500 MG tablet Take 1 tablet (500 mg total) by mouth every 6 (six) hours as needed for muscle spasms. 40 tablet 1   metoprolol succinate (TOPROL-XL) 50 MG 24 hr tablet Take 50 mg by mouth 2 (two) times daily. Take with or immediately following a meal.     Misc Natural Products (GLUCOSAMINE CHOND CMP TRIPLE) TABS Take 1 tablet  by mouth daily.     morphine (MS CONTIN) 15 MG 12 hr tablet Take 1 tablet (15 mg total) by mouth every 12 (twelve) hours. 60 tablet 0   Multiple Vitamin (MULTIVITAMIN WITH MINERALS) TABS tablet Take 1 tablet by mouth daily.     omeprazole (PRILOSEC) 20 MG capsule Take 20 mg by mouth daily.     ondansetron (ZOFRAN) 8 MG tablet Take 1 tablet (8 mg total) by mouth 2 (two) times daily as needed (Nausea or vomiting). 60 tablet 2   oxyCODONE-acetaminophen (PERCOCET/ROXICET) 5-325 MG tablet Take 1-2 tablets by mouth every 4 (four) hours as needed for severe  pain. 90 tablet 0   prochlorperazine (COMPAZINE) 10 MG tablet Take 1 tablet (10 mg total) by mouth every 6 (six) hours as needed (Nausea or vomiting). 60 tablet 2   traMADol (ULTRAM) 50 MG tablet Take 1 tablet (50 mg total) by mouth 2 (two) times daily as needed for moderate pain. 60 tablet 0   No current facility-administered medications for this visit.    VITAL SIGNS: There were no vitals taken for this visit. There were no vitals filed for this visit.  Estimated body mass index is 27 kg/m as calculated from the following:   Height as of 10/28/20: _0  (1.575 m).   Weight as of 10/28/20: 67 kg (147 lb 9.6 oz).  LABS: CBC:    Component Value Date/Time   WBC 4.2 11/02/2020 0943   HGB 9.6 (L) 11/02/2020 0943   HCT 29.4 (L) 11/02/2020 0943   PLT 191 11/02/2020 0943   MCV 98.0 11/02/2020 0943   NEUTROABS 3.2 11/02/2020 0943   LYMPHSABS 0.5 (L) 11/02/2020 0943   MONOABS 0.4 11/02/2020 0943   EOSABS 0.1 11/02/2020 0943   BASOSABS 0.0 11/02/2020 0943   Comprehensive Metabolic Panel:    Component Value Date/Time   NA 136 11/02/2020 0943   K 3.4 (L) 11/02/2020 0943   CL 94 (L) 11/02/2020 0943   CO2 31 11/02/2020 0943   BUN 23 11/02/2020 0943   CREATININE 1.67 (H) 11/02/2020 0943   GLUCOSE 118 (H) 11/02/2020 0943   CALCIUM 13.8 (HH) 11/02/2020 0943   AST 27 11/02/2020 0943   ALT 20 11/02/2020 0943   ALKPHOS 92 11/02/2020 0943   BILITOT 0.4 11/02/2020 0943   PROT 6.6 11/02/2020 0943   ALBUMIN 3.9 11/02/2020 0943     Present during today's visit: patient, her husband Clair Gulling, and daughter-in-law (via telephone)   Assessment and Plan: Start plan: She will begin her Revlimid this evening at bedtime   Patient Education I spoke with patient for overview of new oral chemotherapy medication: Revlimid (lenalidomide) and her other at home regimen medications  Administration: Counseled patient on administration, dosing, side effects, monitoring, drug-food interactions, safe handling,  storage, and disposal. Patient will take: Revlimid: Take 1 capsule (25 mg total) by mouth daily. Take for 14 days, then hold for 7 days. Repeat every 21 days. Aspirin: Take 1 tablet (81 mg) by mouth daily. Acyclovir: Take 1 tablet (400 mg total) by mouth 2 (two) times daily Dexamethasone: Take 5 tabs (20 mg) weekly the day after daratumumab for 12 weeks. Take with breakfast.  Side Effects: Side effects include but not limited to: rash/itchy skin, N/V, fatigue, decreased wbc/hgb/plt, constipation or diarrhea.   Rash: she knows to be on the look out for rash the the initiation of lenalidomide N/V: she has some medication to use on hand for N/V, she had already needed to some some of  the antiemetics  Drug-drug Interactions (DDI): No DDIs  Adherence: After discussion with patient no patient barriers to medication adherence identified.  Reviewed with patient importance of keeping a medication schedule and plan for any missed doses.  Ms. Rice voiced understanding and appreciation. All questions answered. Medication handout and calendar provided.  Provided patient with [facility] phone number. Patient knows to call the office with questions or concerns. Oral [facility] will continue to follow.  Patient expressed understanding and was in agreement with this plan. She also understands that She can call clinic at any time with any questions, concerns, or complaints.   Medication Access Issues: No issues, patient fills at [facility]  Follow-up plan: f/u in 1 week  Thank you for allowing me to participate in the care of this patient.   Time Total: 30 mins  Visit consisted of counseling and education on dealing with issues of symptom management in the setting of serious and potentially life-threatening illness.Greater than 50%  of this time was spent counseling and coordinating care related to the above assessment and plan.  Signed  by: Darl Pikes, PharmD, BCPS, Salley Slaughter, CPP Hematology/Oncology Clinical Pharmacist Practitioner [facility]/HP/[facility] [phone]  11/02/2020 9:03 AM

## 2020-11-02 NOTE — Progress Notes (Signed)
Pt/husband has questions about the dex scheduling. Pt has been having twitching/jerking, and states she is dropping things and has fallen.

## 2020-11-02 NOTE — Progress Notes (Signed)
Cr 1.67 ok to proceed per MD.  Reviewed labs/vitals.

## 2020-11-02 NOTE — Patient Instructions (Addendum)
[facility]  Discharge Instructions: Thank you for choosing [facility] to provide your oncology and hematology care.  If you have a lab appointment with the [facility], please go directly to the [facility] and check in at the registration area.  Wear comfortable clothing and clothing appropriate for easy access to any Portacath or PICC line.   We strive to give you quality time with your provider. You may need to reschedule your appointment if you arrive late (15 or more minutes).  Arriving late affects you and other patients whose appointments are after yours.  Also, if you miss three or more appointments without notifying the office, you may be dismissed from the clinic at the provider's discretion.      For prescription refill requests, have your pharmacy contact our office and allow 72 hours for refills to be completed.    Today you received the following chemotherapy and/or immunotherapy agents DARZALEX, ZOMETA and VELCADE      To help prevent nausea and vomiting after your treatment, we encourage you to take your nausea medication as directed.  BELOW ARE SYMPTOMS THAT SHOULD BE REPORTED IMMEDIATELY: *FEVER GREATER THAN 100.4 F (38 C) OR HIGHER *CHILLS OR SWEATING *NAUSEA AND VOMITING THAT IS NOT CONTROLLED WITH YOUR NAUSEA MEDICATION *UNUSUAL SHORTNESS OF BREATH *UNUSUAL BRUISING OR BLEEDING *URINARY PROBLEMS (pain or burning when urinating, or frequent urination) *BOWEL PROBLEMS (unusual diarrhea, constipation, pain near the anus) TENDERNESS IN MOUTH AND THROAT WITH OR WITHOUT PRESENCE OF ULCERS (sore throat, sores in mouth, or a toothache) UNUSUAL RASH, SWELLING OR PAIN  UNUSUAL VAGINAL DISCHARGE OR ITCHING   Items with * indicate a potential emergency and should be followed up as soon as possible or go to the Emergency Department if any problems should occur.  Please show the CHEMOTHERAPY ALERT CARD or IMMUNOTHERAPY ALERT  CARD at check-in to the Emergency Department and triage nurse.  Should you have questions after your visit or need to cancel or reschedule your appointment, please contact [facility]  [phone] and follow the prompts.  Office hours are 8:00 a.m. to 4:30 p.m. Monday - Friday. Please note that voicemails left after 4:00 p.m. may not be returned until the following business day.  We are closed weekends and major holidays. You have access to a nurse at all times for urgent questions. Please call the main number to the clinic [phone] and follow the prompts.  For any non-urgent questions, you may also contact your provider using MyChart. We now offer e-Visits for anyone 47 and older to request care online for non-urgent symptoms. For details visit [email or web address].   Also download the MyChart app! Go to the app store, search "MyChart", open the app, select [facility], and log in with your MyChart username and password.  Due to Covid, a mask is required upon entering the hospital/clinic. If you do not have a mask, one will be given to you upon arrival. For doctor visits, patients may have 1 support person aged 42 or older with them. For treatment visits, patients cannot have anyone with them due to current Covid guidelines and our immunocompromised population.   Bortezomib injection What is this medication? BORTEZOMIB (bor TEZ oh mib) targets proteins in cancer cells and stops the cancer cells from growing. It treats multiple myeloma and mantle cell lymphoma. This medicine may be used for other purposes; ask your health care provider or pharmacist if you have questions. COMMON BRAND  NAME(S): Velcade What should I tell my care team before I take this medication? They need to know if you have any of these conditions: dehydration diabetes (high blood sugar) heart disease liver disease tingling of the fingers or toes or other nerve disorder an unusual  or allergic reaction to bortezomib, mannitol, boron, other medicines, foods, dyes, or preservatives pregnant or trying to get pregnant breast-feeding How should I use this medication? This medicine is injected into a vein or under the skin. It is given by a health care provider in a hospital or clinic setting. Talk to your health care provider about the use of this medicine in children. Special care may be needed. Overdosage: If you think you have taken too much of this medicine contact a poison control center or emergency room at once. NOTE: This medicine is only for you. Do not share this medicine with others. What if I miss a dose? Keep appointments for follow-up doses. It is important not to miss your dose. Call your health care provider if you are unable to keep an appointment. What may interact with this medication? This medicine may interact with the following medications: ketoconazole rifampin This list may not describe all possible interactions. Give your health care provider a list of all the medicines, herbs, non-prescription drugs, or dietary supplements you use. Also tell them if you smoke, drink alcohol, or use illegal drugs. Some items may interact with your medicine. What should I watch for while using this medication? Your condition will be monitored carefully while you are receiving this medicine. You may need blood work done while you are taking this medicine. You may get drowsy or dizzy. Do not drive, use machinery, or do anything that needs mental alertness until you know how this medicine affects you. Do not stand up or sit up quickly, especially if you are an older patient. This reduces the risk of dizzy or fainting spells This medicine may increase your risk of getting an infection. Call your health care provider for advice if you get a fever, chills, sore throat, or other symptoms of a cold or flu. Do not treat yourself. Try to avoid being around people who are  sick. Check with your health care provider if you have severe diarrhea, nausea, and vomiting, or if you sweat a lot. The loss of too much body fluid may make it dangerous for you to take this medicine. Do not become pregnant while taking this medicine or for 7 months after stopping it. Women should inform their health care provider if they wish to become pregnant or think they might be pregnant. Men should not father a child while taking this medicine and for 4 months after stopping it. There is a potential for serious harm to an unborn child. Talk to your health care provider for more information. Do not breast-feed an infant while taking this medicine or for 2 months after stopping it. This medicine may make it more difficult to get pregnant or father a child. Talk to your health care provider if you are concerned about your fertility. What side effects may I notice from receiving this medication? Side effects that you should report to your doctor or health care professional as soon as possible: allergic reactions (skin rash; itching or hives; swelling of the face, lips, or tongue) bleeding (bloody or black, tarry stools; red or dark brown urine; spitting up blood or brown material that looks like coffee grounds; red spots on the skin; unusual bruising or bleeding  from the eye, gums, or nose) blurred vision or changes in vision confusion constipation headache heart failure (trouble breathing; fast, irregular heartbeat; sudden weight gain; swelling of the ankles, feet, hands) infection (fever, chills, cough, sore throat, pain or trouble passing urine) lack or loss of appetite liver injury (dark yellow or brown urine; general ill feeling or flu-like symptoms; loss of appetite, right upper belly pain; yellowing of the eyes or skin) low blood pressure (dizziness; feeling faint or lightheaded, falls; unusually weak or tired) muscle cramps pain, redness, or irritation at site where injected pain,  tingling, numbness in the hands or feet seizures trouble breathing unusual bruising or bleeding Side effects that usually do not require medical attention (report to your doctor or health care professional if they continue or are bothersome): diarrhea nausea stomach pain trouble sleeping vomiting This list may not describe all possible side effects. Call your doctor for medical advice about side effects. You may report side effects to FDA at 1-800-FDA-1088. Where should I keep my medication? This medicine is given in a hospital or clinic. It will not be stored at home. NOTE: This sheet is a summary. It may not cover all possible information. If you have questions about this medicine, talk to your doctor, pharmacist, or health care provider.  2022 Elsevier/Gold Standard (2020-02-05 13:22:53) Zoledronic Acid Injection (Hypercalcemia, Oncology) What is this medication? ZOLEDRONIC ACID (ZOE le dron ik AS id) slows calcium loss from bones. It high calcium levels in the blood from some kinds of cancer. It may be used in other people at risk for bone loss. This medicine may be used for other purposes; ask your health care provider or pharmacist if you have questions. COMMON BRAND NAME(S): Zometa What should I tell my care team before I take this medication? They need to know if you have any of these conditions: cancer dehydration dental disease kidney disease liver disease low levels of calcium in the blood lung or breathing disease (asthma) receiving steroids like dexamethasone or prednisone an unusual or allergic reaction to zoledronic acid, other medicines, foods, dyes, or preservatives pregnant or trying to get pregnant breast-feeding How should I use this medication? This drug is injected into a vein. It is given by a health care provider in a hospital or clinic setting. Talk to your health care provider about the use of this drug in children. Special care may be  needed. Overdosage: If you think you have taken too much of this medicine contact a poison control center or emergency room at once. NOTE: This medicine is only for you. Do not share this medicine with others. What if I miss a dose? Keep appointments for follow-up doses. It is important not to miss your dose. Call your health care provider if you are unable to keep an appointment. What may interact with this medication? certain antibiotics given by injection NSAIDs, medicines for pain and inflammation, like ibuprofen or naproxen some diuretics like bumetanide, furosemide teriparatide thalidomide This list may not describe all possible interactions. Give your health care provider a list of all the medicines, herbs, non-prescription drugs, or dietary supplements you use. Also tell them if you smoke, drink alcohol, or use illegal drugs. Some items may interact with your medicine. What should I watch for while using this medication? Visit your health care provider for regular checks on your progress. It may be some time before you see the benefit from this drug. Some people who take this drug have severe bone, joint, or muscle pain.  This drug may also increase your risk for jaw problems or a broken thigh bone. Tell your health care provider right away if you have severe pain in your jaw, bones, joints, or muscles. Tell you health care provider if you have any pain that does not go away or that gets worse. Tell your dentist and dental surgeon that you are taking this drug. You should not have major dental surgery while on this drug. See your dentist to have a dental exam and fix any dental problems before starting this drug. Take good care of your teeth while on this drug. Make sure you see your dentist for regular follow-up appointments. You should make sure you get enough calcium and vitamin D while you are taking this drug. Discuss the foods you eat and the vitamins you take with your health care  provider. Check with your health care provider if you have severe diarrhea, nausea, and vomiting, or if you sweat a lot. The loss of too much body fluid may make it dangerous for you to take this drug. You may need blood work done while you are taking this drug. Do not become pregnant while taking this drug. Women should inform their health care provider if they wish to become pregnant or think they might be pregnant. There is potential for serious harm to an unborn child. Talk to your health care provider for more information. What side effects may I notice from receiving this medication? Side effects that you should report to your doctor or health care provider as soon as possible: allergic reactions (skin rash, itching or hives; swelling of the face, lips, or tongue) bone pain infection (fever, chills, cough, sore throat, pain or trouble passing urine) jaw pain, especially after dental work joint pain kidney injury (trouble passing urine or change in the amount of urine) low blood pressure (dizziness; feeling faint or lightheaded, falls; unusually weak or tired) low calcium levels (fast heartbeat; muscle cramps or pain; pain, tingling, or numbness in the hands or feet; seizures) low magnesium levels (fast, irregular heartbeat; muscle cramp or pain; muscle weakness; tremors; seizures) low red blood cell counts (trouble breathing; feeling faint; lightheaded, falls; unusually weak or tired) muscle pain redness, blistering, peeling, or loosening of the skin, including inside the mouth severe diarrhea swelling of the ankles, feet, hands trouble breathing Side effects that usually do not require medical attention (report to your doctor or health care provider if they continue or are bothersome): anxious constipation coughing depressed mood eye irritation, itching, or pain fever general ill feeling or flu-like symptoms nausea pain, redness, or irritation at site where injected trouble  sleeping This list may not describe all possible side effects. Call your doctor for medical advice about side effects. You may report side effects to FDA at 1-800-FDA-1088. Where should I keep my medication? This drug is given in a hospital or clinic. It will not be stored at home. NOTE: This sheet is a summary. It may not cover all possible information. If you have questions about this medicine, talk to your doctor, pharmacist, or health care provider.  2022 Elsevier/Gold Standard (2018-11-28 09:13:00)  Daratumumab injection What is this medication? DARATUMUMAB (dar a toom ue mab) is a monoclonal antibody. It is used to treat multiple myeloma. This medicine may be used for other purposes; ask your health care provider or pharmacist if you have questions. COMMON BRAND NAME(S): DARZALEX What should I tell my care team before I take this medication? They need to know if  you have any of these conditions: hereditary fructose intolerance infection (especially a virus infection such as chickenpox, herpes, or hepatitis B virus) lung or breathing disease (asthma, COPD) an unusual or allergic reaction to daratumumab, sorbitol, other medicines, foods, dyes, or preservatives pregnant or trying to get pregnant breast-feeding How should I use this medication? This medicine is for infusion into a vein. It is given by a health care professional in a hospital or clinic setting. Talk to your pediatrician regarding the use of this medicine in children. Special care may be needed. Overdosage: If you think you have taken too much of this medicine contact a poison control center or emergency room at once. NOTE: This medicine is only for you. Do not share this medicine with others. What if I miss a dose? Keep appointments for follow-up doses as directed. It is important not to miss your dose. Call your doctor or health care professional if you are unable to keep an appointment. What may interact with this  medication? Interactions have not been studied. This list may not describe all possible interactions. Give your health care provider a list of all the medicines, herbs, non-prescription drugs, or dietary supplements you use. Also tell them if you smoke, drink alcohol, or use illegal drugs. Some items may interact with your medicine. What should I watch for while using this medication? Your condition will be monitored carefully while you are receiving this medicine. This medicine can cause serious allergic reactions. To reduce your risk, your health care provider may give you other medicine to take before receiving this one. Be sure to follow the directions from your health care provider. This medicine can affect the results of blood tests to match your blood type. These changes can last for up to 6 months after the final dose. Your healthcare provider will do blood tests to match your blood type before you start treatment. Tell all of your healthcare providers that you are being treated with this medicine before receiving a blood transfusion. This medicine can affect the results of some tests used to determine treatment response; extra tests may be needed to evaluate response. Do not become pregnant while taking this medicine or for 3 months after stopping it. Women should inform their health care provider if they wish to become pregnant or think they might be pregnant. There is a potential for serious side effects to an unborn child. Talk to your health care provider for more information. Do not breast-feed an infant while taking this medicine. What side effects may I notice from receiving this medication? Side effects that you should report to your doctor or health care professional as soon as possible: allergic reactions (skin rash, itching, hives, swelling of the face, lips, or tongue) blurred vision infection (fever, chills, cough, sore throat, pain or difficulty passing urine) infusion reaction  (dizziness, fast heartbeat, feeling faint or lightheaded, falls, headache, increase in blood pressure, nausea, vomiting, or wheezing or trouble breathing with loud or whistling sounds) unusual bleeding or bruising Side effects that usually do not require medical attention (report to your doctor or health care professional if they continue or are bothersome): constipation diarrhea pain, tingling, numbness in the hands or feet swelling of the ankles, feet, hands tiredness This list may not describe all possible side effects. Call your doctor for medical advice about side effects. You may report side effects to FDA at 1-800-FDA-1088. Where should I keep my medication? This drug is given in a hospital or clinic and will not  be stored at home. NOTE: This sheet is a summary. It may not cover all possible information. If you have questions about this medicine, talk to your doctor, pharmacist, or health care provider.  2022 Elsevier/Gold Standard (2020-03-25 12:50:38)

## 2020-11-04 ENCOUNTER — Other Ambulatory Visit: Payer: Self-pay

## 2020-11-04 ENCOUNTER — Telehealth: Payer: Self-pay

## 2020-11-04 ENCOUNTER — Encounter: Payer: Self-pay | Admitting: Oncology

## 2020-11-04 DIAGNOSIS — C9000 Multiple myeloma not having achieved remission: Secondary | ICD-10-CM

## 2020-11-04 LAB — PRETREATMENT RBC PHENOTYPE: DAT, IgG: NEGATIVE

## 2020-11-04 MED ORDER — LENALIDOMIDE 25 MG PO CAPS
25.0000 mg | ORAL_CAPSULE | Freq: Every day | ORAL | 0 refills | Status: DC
Start: 2020-11-04 — End: 2020-11-09

## 2020-11-04 NOTE — Telephone Encounter (Signed)
T/C to pt for follow up after 1st treatment.  No answer and unable to leave voice mail.

## 2020-11-05 ENCOUNTER — Other Ambulatory Visit: Payer: Self-pay

## 2020-11-05 ENCOUNTER — Inpatient Hospital Stay: Payer: BC Managed Care – PPO

## 2020-11-05 VITALS — BP 112/59 | HR 93 | Temp 98.4°F

## 2020-11-05 DIAGNOSIS — Z5112 Encounter for antineoplastic immunotherapy: Secondary | ICD-10-CM | POA: Diagnosis not present

## 2020-11-05 DIAGNOSIS — C9000 Multiple myeloma not having achieved remission: Secondary | ICD-10-CM

## 2020-11-05 MED ORDER — ACETAMINOPHEN 325 MG PO TABS: 650.0000 mg | ORAL_TABLET | Freq: Once | ORAL | Status: DC

## 2020-11-05 MED ORDER — DIPHENHYDRAMINE HCL 25 MG PO CAPS: 25.0000 mg | ORAL_CAPSULE | Freq: Once | ORAL | Status: DC

## 2020-11-05 MED ORDER — MONTELUKAST SODIUM 10 MG PO TABS: 10.0000 mg | ORAL_TABLET | Freq: Once | ORAL | Status: DC

## 2020-11-05 MED ORDER — BORTEZOMIB CHEMO SQ INJECTION 3.5 MG (2.5MG/ML)
1.3000 mg/m2 | Freq: Once | INTRAMUSCULAR | Status: AC
  Administered 2020-11-05: 2.25 mg via SUBCUTANEOUS
  Filled 2020-11-05: qty 0.9

## 2020-11-05 MED ORDER — DEXAMETHASONE 4 MG PO TABS: 20.0000 mg | ORAL_TABLET | Freq: Once | ORAL | Status: DC

## 2020-11-05 NOTE — Progress Notes (Signed)
D4 of treatment plan was skipped and D8 released on 9/9. Todays infusion is velcade only (D4). D8 scheduled for 9/13 will be velcade and darzalex faspro

## 2020-11-05 NOTE — Patient Instructions (Signed)
[facility]  Discharge Instructions: Thank you for choosing [facility] to provide your oncology and hematology care.  If you have a lab appointment with the Cancer Center, please go directly to the Cancer Center and check in at the registration area.  Wear comfortable clothing and clothing appropriate for easy access to any Portacath or PICC line.   We strive to give you quality time with your provider. You may need to reschedule your appointment if you arrive late (15 or more minutes).  Arriving late affects you and other patients whose appointments are after yours.  Also, if you miss three or more appointments without notifying the office, you may be dismissed from the clinic at the provider's discretion.      For prescription refill requests, have your pharmacy contact our office and allow 72 hours for refills to be completed.    Today you received the following chemotherapy and/or immunotherapy agents Velcade      To help prevent nausea and vomiting after your treatment, we encourage you to take your nausea medication as directed.  BELOW ARE SYMPTOMS THAT SHOULD BE REPORTED IMMEDIATELY: *FEVER GREATER THAN 100.4 F (38 C) OR HIGHER *CHILLS OR SWEATING *NAUSEA AND VOMITING THAT IS NOT CONTROLLED WITH YOUR NAUSEA MEDICATION *UNUSUAL SHORTNESS OF BREATH *UNUSUAL BRUISING OR BLEEDING *URINARY PROBLEMS (pain or burning when urinating, or frequent urination) *BOWEL PROBLEMS (unusual diarrhea, constipation, pain near the anus) TENDERNESS IN MOUTH AND THROAT WITH OR WITHOUT PRESENCE OF ULCERS (sore throat, sores in mouth, or a toothache) UNUSUAL RASH, SWELLING OR PAIN  UNUSUAL VAGINAL DISCHARGE OR ITCHING   Items with * indicate a potential emergency and should be followed up as soon as possible or go to the Emergency Department if any problems should occur.  Please show the CHEMOTHERAPY ALERT CARD or IMMUNOTHERAPY ALERT CARD at check-in to  the Emergency Department and triage nurse.  Should you have questions after your visit or need to cancel or reschedule your appointment, please contact [facility]  [phone] and follow the prompts.  Office hours are 8:00 a.m. to 4:30 p.m. Monday - Friday. Please note that voicemails left after 4:00 p.m. may not be returned until the following business day.  We are closed weekends and major holidays. You have access to a nurse at all times for urgent questions. Please call the main number to the clinic [phone] and follow the prompts.  For any non-urgent questions, you may also contact your provider using MyChart. We now offer e-Visits for anyone 18 and older to request care online for non-urgent symptoms. For details visit [email or web address].   Also download the MyChart app! Go to the app store, search "MyChart", open the app, select [facility], and log in with your MyChart username and password.  Due to Covid, a mask is required upon entering the hospital/clinic. If you do not have a mask, one will be given to you upon arrival. For doctor visits, patients may have 1 support person aged 18 or older with them. For treatment visits, patients cannot have anyone with them due to current Covid guidelines and our immunocompromised population.  

## 2020-11-06 NOTE — Progress Notes (Signed)
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  INTERVAL HISTORY: Patient returns to clinic today for further evaluation and consideration of cycle 1, day 8 of DVRd.  She feels significantly improved after her calcium levels have decreased with the help of Zometa.  She continues to have weakness and fatigue, but this is improved.  She has no further falls or tremors.  She does not report any further confusion.  She continues to have back pain.  She has no neurologic complaints.  She denies any recent fevers.  She has no chest pain, shortness of breath, cough, or hemoptysis.  She denies any nausea, vomiting, constipation, or diarrhea.  She has no urinary complaints.  Patient offers no further specific complaints today.  REVIEW OF SYSTEMS:   Review of Systems  Constitutional:  Positive for malaise/fatigue. Negative for fever and weight loss.  Respiratory: Negative.  Negative for cough, hemoptysis and shortness of breath.   Cardiovascular: Negative.  Negative for chest pain and leg swelling.  Gastrointestinal: Negative.  Negative for abdominal pain.  Genitourinary: Negative.  Negative for dysuria.  Musculoskeletal:  Positive for back pain. Negative for falls and joint pain.  Skin: Negative.  Negative for rash.  Neurological:  Positive for tremors. Negative for dizziness, focal weakness, weakness and headaches.  Psychiatric/Behavioral: Negative.  The patient is not nervous/anxious.    As per HPI. Otherwise, a complete review of systems is negative.  PAST MEDICAL HISTORY: Past Medical History:  Diagnosis Date   Anxiety    Diabetes mellitus without complication ([place]) 1093   GERD (gastroesophageal  reflux disease)    Hyperlipidemia    Personal history of colonic polyps    Squamous cell carcinoma of skin 11/25/2013   Left chest. WD SCC with superficial infiltration.   Tachycardia     PAST SURGICAL HISTORY: Past Surgical History:  Procedure Laterality Date   AUGMENTATION MAMMAPLASTY Bilateral 2355   silicone and replacement in 2001   [place], [place], 1998,2003, 2008, 2013   COLONOSCOPY WITH PROPOFOL N/A 08/16/2016   Procedure: COLONOSCOPY WITH PROPOFOL;  Surgeon: Robert Bellow, MD;  Location: [facility] ENDOSCOPY;  Service: Endoscopy;  Laterality: N/A;   DILATION AND CURETTAGE OF UTERUS     ENDOMETRIAL ABLATION  12/1991   ganglion cyst removal      IR IMAGING GUIDED PORT INSERTION  10/27/2020   KYPHOPLASTY N/A 10/28/2020   Procedure: T12 and L3 KYPHOPLASTY;  Surgeon: Hessie Knows, MD;  Location: [facility] ORS;  Service: Orthopedics;  Laterality: N/A;   TONSILLECTOMY     WRIST SURGERY Right 05/1995    FAMILY HISTORY: Family History  Problem Relation Age of Onset   Colon polyps Sister    Colon cancer Father 92   Diabetes Mother    Breast cancer Neg Hx     ADVANCED DIRECTIVES (Y/N):  N  HEALTH MAINTENANCE: Social History   Tobacco Use   Smoking status: Former    Packs/day: 1.00    Years: 4.00    Pack years: 4.00    Types: Cigarettes    Quit date: 1982    Years since quitting: 40.7   Smokeless tobacco: Never  Substance Use Topics  Alcohol use: Yes    Alcohol/week: 1.0 - 2.0 standard drink    Types: 1 - 2 Glasses of wine per week    Comment: occassionally   Drug use: No     Colonoscopy:  PAP:  Bone density:  Lipid panel:  No Known Allergies  Current Outpatient Medications  Medication Sig Dispense Refill   acyclovir (ZOVIRAX) 400 MG tablet Take 1 tablet (400 mg total) by mouth 2 (two) times daily. 60 tablet 5   ASPIRIN 81 PO Take 81 mg by mouth daily.     atorvastatin (LIPITOR) 20 MG  tablet Take 20 mg by mouth daily.     busPIRone (BUSPAR) 30 MG tablet Take 30 mg by mouth 2 (two) times daily.     CALCIUM-VITAMIN D PO Take 1 tablet by mouth daily.     cyclobenzaprine (FLEXERIL) 10 MG tablet Take 10 mg by mouth at bedtime.     dexamethasone (DECADRON) 4 MG tablet Take 5 tabs (20 mg) weekly the day after daratumumab for 12 weeks. Take with breakfast.     lidocaine-prilocaine (EMLA) cream Apply 1 application topically as needed. 30 g 2   liraglutide (VICTOZA) 18 MG/3ML SOPN Inject 1.2 mg into the skin daily.     meloxicam (MOBIC) 15 MG tablet Take 15 mg by mouth daily.     metFORMIN (GLUCOPHAGE) 500 MG tablet Take 1,000 mg by mouth 2 (two) times daily with a meal.     methocarbamol (ROBAXIN) 500 MG tablet Take 1 tablet (500 mg total) by mouth every 6 (six) hours as needed for muscle spasms. 40 tablet 1   metoprolol succinate (TOPROL-XL) 50 MG 24 hr tablet Take 50 mg by mouth 2 (two) times daily. Take with or immediately following a meal.     Misc Natural Products (GLUCOSAMINE CHOND CMP TRIPLE) TABS Take 1 tablet by mouth daily.     morphine (MS CONTIN) 15 MG 12 hr tablet Take 1 tablet (15 mg total) by mouth every 12 (twelve) hours. 60 tablet 0   Multiple Vitamin (MULTIVITAMIN WITH MINERALS) TABS tablet Take 1 tablet by mouth daily.     omeprazole (PRILOSEC) 20 MG capsule Take 20 mg by mouth daily.     ondansetron (ZOFRAN) 8 MG tablet Take 1 tablet (8 mg total) by mouth 2 (two) times daily as needed (Nausea or vomiting). 60 tablet 2   oxyCODONE-acetaminophen (PERCOCET/ROXICET) 5-325 MG tablet Take 1-2 tablets by mouth every 4 (four) hours as needed for severe pain. 90 tablet 0   polyethylene glycol (MIRALAX / GLYCOLAX) 17 g packet Take by mouth daily as needed for mild constipation.     prochlorperazine (COMPAZINE) 10 MG tablet Take 1 tablet (10 mg total) by mouth every 6 (six) hours as needed (Nausea or vomiting). 60 tablet 2   traMADol (ULTRAM) 50 MG tablet Take 1 tablet (50 mg  total) by mouth 2 (two) times daily as needed for moderate pain. 60 tablet 0   lenalidomide (REVLIMID) 10 MG capsule Take 1 capsule (10 mg total) by mouth daily. Take for 14 days, then hold for 7 days. 14 capsule 0   No current facility-administered medications for this visit.   Facility-Administered Medications Ordered in Other Visits  Medication Dose Route Frequency Provider Last Rate Last Admin   bortezomib SQ (VELCADE) chemo injection (2.91m/mL concentration) 2.25 mg  1.3 mg/m2 (Treatment Plan Recorded) Subcutaneous Once FLloyd Huger MD       daratumumab-hyaluronidase-fihj ([facility]) 1800-30000 MG-UT/15ML chemo SQ injection 1,800 mg  1,800 mg Subcutaneous Once Lloyd Huger, MD       heparin lock flush 100 UNIT/ML injection            heparin lock flush 100 unit/mL  500 Units Intravenous Once Lloyd Huger, MD        OBJECTIVE: Vitals:   11/09/20 0949  BP: 108/62  Pulse: 68  Resp: 16  Temp: (!) 97.2 F (36.2 C)  SpO2: 99%     Body mass index is 26.54 kg/m.    ECOG FS:1 - Symptomatic but completely ambulatory  General: Well-developed, well-nourished, no acute distress. Eyes: Pink conjunctiva, anicteric sclera. HEENT: Normocephalic, moist mucous membranes. Lungs: No audible wheezing or coughing. Heart: Regular rate and rhythm. Abdomen: Soft, nontender, no obvious distention. Musculoskeletal: No edema, cyanosis, or clubbing. Neuro: Alert, answering all questions appropriately. Cranial nerves grossly intact. Skin: No rashes or petechiae noted. Psych: Normal affect.   LAB RESULTS:  Lab Results  Component Value Date   NA 136 11/09/2020   K 3.7 11/09/2020   CL 104 11/09/2020   CO2 22 11/09/2020   GLUCOSE 143 (H) 11/09/2020   BUN 23 11/09/2020   CREATININE 2.02 (H) 11/09/2020   CALCIUM 8.1 (L) 11/09/2020   PROT 6.7 11/09/2020   ALBUMIN 3.8 11/09/2020   AST 27 11/09/2020   ALT 23 11/09/2020   ALKPHOS 123 11/09/2020   BILITOT 0.6 11/09/2020    GFRNONAA 27 (L) 11/09/2020    Lab Results  Component Value Date   WBC 3.1 (L) 11/09/2020   NEUTROABS 2.4 11/09/2020   HGB 9.4 (L) 11/09/2020   HCT 28.2 (L) 11/09/2020   MCV 97.9 11/09/2020   PLT 121 (L) 11/09/2020     STUDIES: DG Lumbar Spine 2-3 Views  Result Date: 10/28/2020 CLINICAL DATA:  Surgery, elective Z41.9 (ICD-10-CM). Additional history provided: Kyphoplasty T12 and L3. Provided fluoroscopy time 3 minutes, 17 seconds (76.27 mGy). EXAM: LUMBAR SPINE - 2-3 VIEW; DG C-ARM 1-60 MIN COMPARISON:  Lumbar spine MRI 10/19/2020. FINDINGS: Two AP intraprocedural fluoroscopic images of the thoracolumbar spine are submitted. On the initial provided image, kyphoplasty material is present within the T12 vertebral body at site of a known compression fracture. On the second provided image, kyphoplasty material is demonstrated within a lumbar vertebral body (presumably the L3 vertebral body given the provided history), although the exact level is difficult to ascertain given the provided field of view. IMPRESSION: Two intraprocedural fluoroscopic images of the thoracolumbar spine from reported T12 and L3 kyphoplasty, as described. Electronically Signed   By: Kellie Simmering D.O.   On: 10/28/2020 14:08   MR Thoracic Spine W Wo Contrast  Result Date: 10/19/2020 CLINICAL DATA:  Initial evaluation for 2-3 weeks of central, right and left-sided mid back pain extending to the ribs and lumbar spine. History of recent fall on 07/08/2020. History of lambda light chain multiple myeloma. EXAM: MRI THORACIC AND LUMBAR SPINE WITHOUT AND WITH CONTRAST TECHNIQUE: Multiplanar and multiecho pulse sequences of the thoracic and lumbar spine were obtained without and with intravenous contrast. CONTRAST:  28m GADAVIST GADOBUTROL 1 MMOL/ML IV SOLN COMPARISON:  Prior PET-CT from 08/27/2020. FINDINGS: MRI THORACIC SPINE FINDINGS Alignment: Trace levoscoliosis. Alignment otherwise normal with preservation of the normal thoracic  kyphosis. No listhesis. Vertebrae: Diffusely abnormal appearance of the visualized bone marrow with innumerable T1 hypointense, stir hyperintense enhancing lesions within the thoracic spine, consistent with history of multiple myeloma. There is involvement of the centrally all levels. For reference purposes, the largest discrete lesion is  seen at the central posterior aspect of T10 and measures 1.3 cm (series 17, image 9). Minimal convex bowing of the posterior vertebral margin at this level without frank epidural extension (series 19, image 9). Small focus of extra osseous extension measuring 1.5 cm seen at the right anterolateral aspect of the T9-10 level (series 24, image 5). No other significant extra osseous extension. No epidural tumor. Multiple pathologic fractures are seen as follows; - Acute to subacute fracture involving the superior endplate of T2 without significant height loss or bony retropulsion (series 19, image 9). - Acute to subacute compression fracture involving the superior endplate of T4 with 45% anterior height loss without bony retropulsion (series 19, image 9). - Acute to subacute compression fracture involving the superior endplate of T5 with up to 30% central height loss without significant bony retropulsion (series 19, image 9). - Acute to subacute compression fracture involving the superior endplate of Y09 with mild 25% height loss with trace 2 mm bony retropulsion (series 19, image 9). Involvement of the left posterior eighth rib with suspected pathologic fracture noted as well (series 22, image 25). Few additional scattered subcentimeter lesions noted within the ribs elsewhere. Cord: Normal signal and morphology. No significant epidural tumor or abnormal enhancement. Paraspinal and other soft tissues: Paraspinous soft tissues demonstrate no acute or significant finding. Disc levels: T1-2: Minimal disc bulge.  No stenosis. T3-4: Minimal disc bulge with superimposed left foraminal disc  protrusion (series 17, image 10). No significant stenosis. T4-5: Tiny right paracentral disc protrusion without stenosis. C6-7: Mild disc bulge.  No significant stenosis. T7-8: Mild disc bulge, eccentric to the right. No significant stenosis. T11-12: Minimal disc bulge with trace 2 mm bony retropulsion related to the T12 fracture. No significant stenosis. MRI LUMBAR SPINE FINDINGS Segmentation: Standard. Lowest well-formed disc space labeled the L5-S1 level. Alignment: 6 mm anterolisthesis of L4 on L5, with additional trace anterolisthesis of L2 on L3 and L3 on L4. Findings chronic and facet mediated. Underlying trace dextroscoliosis. Vertebrae: Multiple marrow replacing lesions seen throughout the lumbar spine, consistent with history of multiple myeloma. There is involvement of essentially all levels. Involvement of the visualized sacrum and pelvis as well. The dominant lesion is seen involving the right iliac crest, partially visualize, but measuring approximately 6.6 x 4.2 cm (series 4, image 28). Associated extraosseous extension into the adjacent iliacus and gluteal musculature. No other significant extra osseous extension of tumor. A 1.4 cm lesion involving the anterior aspect of L3 demonstrates a trabeculated morphology and is somewhat indeterminate, and could reflect an atypical hemangioma. Associated pathologic compression fracture involving the right aspect of the superior endplate of L3 with no more than mild 10% height loss without bony retropulsion. Vertebral body height otherwise maintained with no other fracture identified. Conus medullaris: Extends to the L1 level and appears normal. No epidural tumor or abnormal enhancement. Paraspinal and other soft tissues: Paraspinous soft tissues otherwise unremarkable. Retroaortic left renal vein noted. Visualized visceral structures otherwise unremarkable. Disc levels: L1-2:  Negative interspace.  Mild facet hypertrophy.  No stenosis. L2-3: Mild disc bulge  with disc desiccation. Mild bilateral facet hypertrophy. No significant spinal stenosis. Foramina remain patent. L3-4: Trace anterolisthesis. Mild disc bulge with disc desiccation. Superimposed broad-based left foraminal disc protrusion closely approximates the exiting left L3 nerve root (series 4, image 22). Mild bilateral facet hypertrophy. No significant spinal stenosis. Foramina remain patent. L4-5: 6 mm anterolisthesis. Degenerative intervertebral disc space narrowing with disc desiccation and diffuse disc bulge. Moderate bilateral facet arthrosis.  Resultant moderate spinal stenosis. Moderate right with mild left L4 foraminal narrowing. L5-S1: Mild disc bulge. Mild bilateral facet hypertrophy. No significant stenosis. IMPRESSION: 1. Multiple marrow replacing lesions scattered throughout the thoracic and lumbar spine, consistent with history of multiple myeloma. 2. Associated acute to subacute pathologic fractures involving the T2, T4, T5, T12, and L3 vertebral bodies as above. No significant bony retropulsion or associated stenosis. 3. Focal pathologic fracture involving the left posterior eighth rib. 4. Small amount of extra osseous extension of tumor along the right anterolateral aspect of the vertebral column at the level of T9-10. Additional local extraosseous extension about a large 6.6 cm lesion involving the right iliac crest. No other significant extra osseous extension at this time. No epidural or intracanalicular involvement. 5. 6 mm facet mediated anterolisthesis of L4 on L5 with resultant moderate canal with mild to moderate right worse than left L4 foraminal stenosis. 6. Additional more mild multilevel spondylosis elsewhere throughout the thoracolumbar spine as above. No other significant stenosis or neural impingement. Electronically Signed   By: Jeannine Boga M.D.   On: 10/19/2020 22:05   MR Lumbar Spine W Wo Contrast  Result Date: 10/19/2020 CLINICAL DATA:  Initial evaluation for 2-3  weeks of central, right and left-sided mid back pain extending to the ribs and lumbar spine. History of recent fall on 07/08/2020. History of lambda light chain multiple myeloma. EXAM: MRI THORACIC AND LUMBAR SPINE WITHOUT AND WITH CONTRAST TECHNIQUE: Multiplanar and multiecho pulse sequences of the thoracic and lumbar spine were obtained without and with intravenous contrast. CONTRAST:  41m GADAVIST GADOBUTROL 1 MMOL/ML IV SOLN COMPARISON:  Prior PET-CT from 08/27/2020. FINDINGS: MRI THORACIC SPINE FINDINGS Alignment: Trace levoscoliosis. Alignment otherwise normal with preservation of the normal thoracic kyphosis. No listhesis. Vertebrae: Diffusely abnormal appearance of the visualized bone marrow with innumerable T1 hypointense, stir hyperintense enhancing lesions within the thoracic spine, consistent with history of multiple myeloma. There is involvement of the centrally all levels. For reference purposes, the largest discrete lesion is seen at the central posterior aspect of T10 and measures 1.3 cm (series 17, image 9). Minimal convex bowing of the posterior vertebral margin at this level without frank epidural extension (series 19, image 9). Small focus of extra osseous extension measuring 1.5 cm seen at the right anterolateral aspect of the T9-10 level (series 24, image 5). No other significant extra osseous extension. No epidural tumor. Multiple pathologic fractures are seen as follows; - Acute to subacute fracture involving the superior endplate of T2 without significant height loss or bony retropulsion (series 19, image 9). - Acute to subacute compression fracture involving the superior endplate of T4 with 283%anterior height loss without bony retropulsion (series 19, image 9). - Acute to subacute compression fracture involving the superior endplate of T5 with up to 30% central height loss without significant bony retropulsion (series 19, image 9). - Acute to subacute compression fracture involving the  superior endplate of TA91with mild 25% height loss with trace 2 mm bony retropulsion (series 19, image 9). Involvement of the left posterior eighth rib with suspected pathologic fracture noted as well (series 22, image 25). Few additional scattered subcentimeter lesions noted within the ribs elsewhere. Cord: Normal signal and morphology. No significant epidural tumor or abnormal enhancement. Paraspinal and other soft tissues: Paraspinous soft tissues demonstrate no acute or significant finding. Disc levels: T1-2: Minimal disc bulge.  No stenosis. T3-4: Minimal disc bulge with superimposed left foraminal disc protrusion (series 17, image 10). No significant stenosis.  T4-5: Tiny right paracentral disc protrusion without stenosis. C6-7: Mild disc bulge.  No significant stenosis. T7-8: Mild disc bulge, eccentric to the right. No significant stenosis. T11-12: Minimal disc bulge with trace 2 mm bony retropulsion related to the T12 fracture. No significant stenosis. MRI LUMBAR SPINE FINDINGS Segmentation: Standard. Lowest well-formed disc space labeled the L5-S1 level. Alignment: 6 mm anterolisthesis of L4 on L5, with additional trace anterolisthesis of L2 on L3 and L3 on L4. Findings chronic and facet mediated. Underlying trace dextroscoliosis. Vertebrae: Multiple marrow replacing lesions seen throughout the lumbar spine, consistent with history of multiple myeloma. There is involvement of essentially all levels. Involvement of the visualized sacrum and pelvis as well. The dominant lesion is seen involving the right iliac crest, partially visualize, but measuring approximately 6.6 x 4.2 cm (series 4, image 28). Associated extraosseous extension into the adjacent iliacus and gluteal musculature. No other significant extra osseous extension of tumor. A 1.4 cm lesion involving the anterior aspect of L3 demonstrates a trabeculated morphology and is somewhat indeterminate, and could reflect an atypical hemangioma. Associated  pathologic compression fracture involving the right aspect of the superior endplate of L3 with no more than mild 10% height loss without bony retropulsion. Vertebral body height otherwise maintained with no other fracture identified. Conus medullaris: Extends to the L1 level and appears normal. No epidural tumor or abnormal enhancement. Paraspinal and other soft tissues: Paraspinous soft tissues otherwise unremarkable. Retroaortic left renal vein noted. Visualized visceral structures otherwise unremarkable. Disc levels: L1-2:  Negative interspace.  Mild facet hypertrophy.  No stenosis. L2-3: Mild disc bulge with disc desiccation. Mild bilateral facet hypertrophy. No significant spinal stenosis. Foramina remain patent. L3-4: Trace anterolisthesis. Mild disc bulge with disc desiccation. Superimposed broad-based left foraminal disc protrusion closely approximates the exiting left L3 nerve root (series 4, image 22). Mild bilateral facet hypertrophy. No significant spinal stenosis. Foramina remain patent. L4-5: 6 mm anterolisthesis. Degenerative intervertebral disc space narrowing with disc desiccation and diffuse disc bulge. Moderate bilateral facet arthrosis. Resultant moderate spinal stenosis. Moderate right with mild left L4 foraminal narrowing. L5-S1: Mild disc bulge. Mild bilateral facet hypertrophy. No significant stenosis. IMPRESSION: 1. Multiple marrow replacing lesions scattered throughout the thoracic and lumbar spine, consistent with history of multiple myeloma. 2. Associated acute to subacute pathologic fractures involving the T2, T4, T5, T12, and L3 vertebral bodies as above. No significant bony retropulsion or associated stenosis. 3. Focal pathologic fracture involving the left posterior eighth rib. 4. Small amount of extra osseous extension of tumor along the right anterolateral aspect of the vertebral column at the level of T9-10. Additional local extraosseous extension about a large 6.6 cm lesion  involving the right iliac crest. No other significant extra osseous extension at this time. No epidural or intracanalicular involvement. 5. 6 mm facet mediated anterolisthesis of L4 on L5 with resultant moderate canal with mild to moderate right worse than left L4 foraminal stenosis. 6. Additional more mild multilevel spondylosis elsewhere throughout the thoracolumbar spine as above. No other significant stenosis or neural impingement. Electronically Signed   By: Jeannine Boga M.D.   On: 10/19/2020 22:05   DG C-Arm 1-60 Min  Result Date: 10/28/2020 CLINICAL DATA:  Surgery, elective Z41.9 (ICD-10-CM). Additional history provided: Kyphoplasty T12 and L3. Provided fluoroscopy time 3 minutes, 17 seconds (76.27 mGy). EXAM: LUMBAR SPINE - 2-3 VIEW; DG C-ARM 1-60 MIN COMPARISON:  Lumbar spine MRI 10/19/2020. FINDINGS: Two AP intraprocedural fluoroscopic images of the thoracolumbar spine are submitted. On the initial provided  image, kyphoplasty material is present within the T12 vertebral body at site of a known compression fracture. On the second provided image, kyphoplasty material is demonstrated within a lumbar vertebral body (presumably the L3 vertebral body given the provided history), although the exact level is difficult to ascertain given the provided field of view. IMPRESSION: Two intraprocedural fluoroscopic images of the thoracolumbar spine from reported T12 and L3 kyphoplasty, as described. Electronically Signed   By: Kellie Simmering D.O.   On: 10/28/2020 14:08   IR IMAGING GUIDED PORT INSERTION  Result Date: 10/27/2020 INDICATION: Myeloma EXAM: IMPLANTED PORT A CATH PLACEMENT WITH ULTRASOUND AND FLUOROSCOPIC GUIDANCE MEDICATIONS: None ANESTHESIA/SEDATION: Moderate (conscious) sedation was employed during this procedure. A total of Versed 2 mg and Fentanyl 100 mcg was administered intravenously. Moderate Sedation Time: 16 minutes. The patient's level of consciousness and vital signs were monitored  continuously by radiology nursing throughout the procedure under my direct supervision. FLUOROSCOPY TIME:  0 minutes, 48 seconds (4.1 mGy) COMPLICATIONS: None immediate. PROCEDURE: The procedure, risks, benefits, and alternatives were explained to the patient. Questions regarding the procedure were encouraged and answered. The patient understands and consents to the procedure. A timeout was performed prior to the initiation of the procedure. Patient positioned supine on the angiography table. Right neck and anterior upper chest prepped and draped in the usual sterile fashion. All elements of maximal sterile barrier were utilized including, cap, mask, sterile gown, sterile gloves, large sterile drape, hand scrubbing and 2% Chlorhexidine for skin cleaning. The right internal jugular vein was evaluated with ultrasound and shown to be patent. A permanent ultrasound image was obtained and placed in the patient's medical record. Local anesthesia was provided with 1% lidocaine with epinephrine. Using sterile gel and a sterile probe cover, the right internal jugular vein was entered with a 21 ga needle during real time ultrasound guidance. 0.018 inch guidewire placed and 21 ga needle exchanged for transitional dilator set. Utilizing fluoroscopy, 0.035 inch guidewire advanced through the needle without difficulty. Attention then turned to the right anterior upper chest. Following local lidocaine administration, a port pocket was created. The catheter was connected to the port and brought from the pocket to the venotomy site through a subcutaneous tunnel. The catheter was cut to size and inserted through the peel-away sheath. The catheter tip was positioned at the cavoatrial junction using fluoroscopic guidance. The port aspirated and flushed well. The port pocket was closed with deep and superficial absorbable suture. The port pocket incision and venotomy sites were also sealed with Dermabond. IMPRESSION: Successful  placement of a right internal jugular approach power injectable Port-A-Cath. The catheter is ready for immediate use. Electronically Signed   By: Miachel Roux M.D.   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  PLAN:      1.  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  She also has elevated lambda free light chains of 432.1.  Immunoglobulins and SPEP are normal.  Bone marrow biopsy completed on August 26, 2020 revealed 25% plasma cells along with the deletion of 1p which is considered poor prognosis.  PET scan results from September 06, 2020 reviewed independently with 3 hypermetabolic lesions in right C5-6 facets, right iliac crest lesion, and L3 vertebral lesion.  After lengthy discussion with the patient, she agreed to pursue chemotherapy with daratumumab, Velcade, Revlimid, and dexamethasone followed by autologous bone marrow transplant.  Patient will  receive weekly daratumumab for the first 3-4 cycles along with Velcade on days 1, 4, 8, and 11.  She will receive Revlimid on days 1 through 14 of a 21-day cycle.  Plan to do 4-6 cycles prior to autologous bone cell transplant at [facility].  Her initial appointment at [facility] is on November 15, 2020.  Proceed with cycle 1, day 8 of treatment today.  Return to clinic Friday for Velcade only and then in 1 week for daratumumab only.  Patient will then return to clinic in 2 weeks for further evaluation and consideration of cycle 2, day 1.   2.  Elevated liver enzymes: Resolved.   3.  Pain: Patient has now completed XRT.  She had kyphoplasty x2, but after her fall recently pain is worse.  Continue current narcotics.  Patient has follow-up with orthopedics next week. 4.  Hypercalcemia: Resolved.  Patient's calcium levels are 8.1.  She last received Zometa on November 02, 2020. 5.  Renal insufficiency: Patient's creatinine is trended up to 2.02.  We will consider dose reducing  Revlimid at the beginning of cycle 2.  Monitor. 6.  Pancytopenia: Secondary to chemotherapy, monitor.  Patient expressed understanding and was in agreement with this plan. She also understands that She can call clinic at any time with any questions, concerns, or complaints.   Cancer Staging Lambda light chain myeloma ([place]) Staging form: Plasma Cell Myeloma and Plasma Cell Disorders, AJCC 8th Edition - Clinical stage from 09/16/2020: RISS Stage I (Beta-2-microglobulin (mg/L): 2.5, Albumin (g/dL): 4.9, ISS: Stage I, High-risk cytogenetics: Absent, LDH: Normal) - Signed by Lloyd Huger, MD on 09/16/2020 Beta 2 microglobulin range (mg/L): Less than 3.5 Albumin range (g/dL): Greater than or equal to 3.5 Cytogenetics: 1p deletion  Lloyd Huger, MD   11/09/2020 11:39 AM

## 2020-11-09 ENCOUNTER — Inpatient Hospital Stay: Payer: BC Managed Care – PPO

## 2020-11-09 ENCOUNTER — Inpatient Hospital Stay: Payer: BC Managed Care – PPO | Admitting: Pharmacist

## 2020-11-09 ENCOUNTER — Other Ambulatory Visit: Payer: Self-pay | Admitting: Oncology

## 2020-11-09 ENCOUNTER — Inpatient Hospital Stay (HOSPITAL_BASED_OUTPATIENT_CLINIC_OR_DEPARTMENT_OTHER): Payer: BC Managed Care – PPO | Admitting: Oncology

## 2020-11-09 ENCOUNTER — Other Ambulatory Visit: Payer: Self-pay

## 2020-11-09 VITALS — BP 108/62 | HR 68 | Temp 97.2°F | Resp 16 | Wt 145.1 lb

## 2020-11-09 VITALS — BP 118/51 | HR 85 | Resp 18

## 2020-11-09 DIAGNOSIS — C9000 Multiple myeloma not having achieved remission: Secondary | ICD-10-CM

## 2020-11-09 DIAGNOSIS — Z5112 Encounter for antineoplastic immunotherapy: Secondary | ICD-10-CM | POA: Diagnosis not present

## 2020-11-09 LAB — COMPREHENSIVE METABOLIC PANEL
ALT: 23 U/L (ref 0–44)
AST: 27 U/L (ref 15–41)
Albumin: 3.8 g/dL (ref 3.5–5.0)
Alkaline Phosphatase: 123 U/L (ref 38–126)
Anion gap: 10 (ref 5–15)
BUN: 23 mg/dL (ref 8–23)
CO2: 22 mmol/L (ref 22–32)
Calcium: 8.1 mg/dL — ABNORMAL LOW (ref 8.9–10.3)
Chloride: 104 mmol/L (ref 98–111)
Creatinine, Ser: 2.02 mg/dL — ABNORMAL HIGH (ref 0.44–1.00)
GFR, Estimated: 27 mL/min — ABNORMAL LOW (ref >60)
Glucose, Bld: 143 mg/dL — ABNORMAL HIGH (ref 70–99)
Potassium: 3.7 mmol/L (ref 3.5–5.1)
Sodium: 136 mmol/L (ref 135–145)
Total Bilirubin: 0.6 mg/dL (ref 0.3–1.2)
Total Protein: 6.7 g/dL (ref 6.5–8.1)

## 2020-11-09 LAB — CBC WITH DIFFERENTIAL/PLATELET
Abs Immature Granulocytes: 0.01 10*3/uL (ref 0.00–0.07)
Basophils Absolute: 0 10*3/uL (ref 0.0–0.1)
Basophils Relative: 0 %
Eosinophils Absolute: 0.2 10*3/uL (ref 0.0–0.5)
Eosinophils Relative: 7 %
HCT: 28.2 % — ABNORMAL LOW (ref 36.0–46.0)
Hemoglobin: 9.4 g/dL — ABNORMAL LOW (ref 12.0–15.0)
Immature Granulocytes: 0 %
Lymphocytes Relative: 10 %
Lymphs Abs: 0.3 10*3/uL — ABNORMAL LOW (ref 0.7–4.0)
MCH: 32.6 pg (ref 26.0–34.0)
MCHC: 33.3 g/dL (ref 30.0–36.0)
MCV: 97.9 fL (ref 80.0–100.0)
Monocytes Absolute: 0.2 10*3/uL (ref 0.1–1.0)
Monocytes Relative: 6 %
Neutro Abs: 2.4 10*3/uL (ref 1.7–7.7)
Neutrophils Relative %: 77 %
Platelets: 121 10*3/uL — ABNORMAL LOW (ref 150–400)
RBC: 2.88 MIL/uL — ABNORMAL LOW (ref 3.87–5.11)
RDW: 13.1 % (ref 11.5–15.5)
WBC: 3.1 10*3/uL — ABNORMAL LOW (ref 4.0–10.5)
nRBC: 0 % (ref 0.0–0.2)

## 2020-11-09 MED ORDER — SODIUM CHLORIDE 0.9% FLUSH
10.0000 mL | Freq: Once | INTRAVENOUS | Status: DC | PRN
  Filled 2020-11-09: qty 10

## 2020-11-09 MED ORDER — DEXAMETHASONE 4 MG PO TABS
20.0000 mg | ORAL_TABLET | Freq: Once | ORAL | Status: AC
  Administered 2020-11-09: 20 mg via ORAL
  Filled 2020-11-09: qty 5

## 2020-11-09 MED ORDER — HEPARIN SOD (PORK) LOCK FLUSH 100 UNIT/ML IV SOLN
500.0000 [IU] | Freq: Once | INTRAVENOUS | Status: DC | PRN
  Filled 2020-11-09: qty 5

## 2020-11-09 MED ORDER — HEPARIN SOD (PORK) LOCK FLUSH 100 UNIT/ML IV SOLN
500.0000 [IU] | Freq: Once | INTRAVENOUS | Status: AC
  Filled 2020-11-09: qty 5

## 2020-11-09 MED ORDER — LENALIDOMIDE 10 MG PO CAPS: 10.0000 mg | ORAL_CAPSULE | Freq: Every day | ORAL | 0 refills | Status: DC

## 2020-11-09 MED ORDER — HEPARIN SOD (PORK) LOCK FLUSH 100 UNIT/ML IV SOLN
INTRAVENOUS | Status: AC
  Administered 2020-11-09: 500 [IU] via INTRAVENOUS
  Filled 2020-11-09: qty 5

## 2020-11-09 MED ORDER — DIPHENHYDRAMINE HCL 25 MG PO CAPS
25.0000 mg | ORAL_CAPSULE | Freq: Once | ORAL | Status: AC
  Administered 2020-11-09: 25 mg via ORAL
  Filled 2020-11-09: qty 1

## 2020-11-09 MED ORDER — MONTELUKAST SODIUM 10 MG PO TABS
10.0000 mg | ORAL_TABLET | Freq: Once | ORAL | Status: AC
  Administered 2020-11-09: 10 mg via ORAL
  Filled 2020-11-09: qty 1

## 2020-11-09 MED ORDER — BORTEZOMIB CHEMO SQ INJECTION 3.5 MG (2.5MG/ML)
1.3000 mg/m2 | Freq: Once | INTRAMUSCULAR | Status: AC
  Administered 2020-11-09: 2.25 mg via SUBCUTANEOUS
  Filled 2020-11-09: qty 0.9

## 2020-11-09 MED ORDER — ACETAMINOPHEN 325 MG PO TABS
650.0000 mg | ORAL_TABLET | Freq: Once | ORAL | Status: AC
  Administered 2020-11-09: 650 mg via ORAL
  Filled 2020-11-09: qty 2

## 2020-11-09 MED ORDER — DARATUMUMAB-HYALURONIDASE-FIHJ 1800-30000 MG-UT/15ML ~~LOC~~ SOLN
1800.0000 mg | Freq: Once | SUBCUTANEOUS | Status: AC
  Administered 2020-11-09: 1800 mg via SUBCUTANEOUS
  Filled 2020-11-09: qty 15

## 2020-11-09 MED FILL — REVLIMID 10MG CAP: 21 days supply | Qty: 14

## 2020-11-09 NOTE — Progress Notes (Signed)
Pt complains of sever pain despite medication, especially when first getting up from bed.

## 2020-11-09 NOTE — Progress Notes (Signed)
1042- Creatinine: 2.02. MD, Dr. Grayland Ormond, notified and aware. Per MD order: proceed with scheduled Darzalex Faspro and Velcade treatment today.

## 2020-11-09 NOTE — Progress Notes (Signed)
[facility] [place]  [phone] [phone]  Patient Care Team: Albina Billet, MD as PCP - General (Internal Medicine) Bary Castilla Forest Gleason, MD as Consulting Physician (General Surgery)   Name of the patient: Hannah Mcdonald  [id number]  [birth year]   Date of visit: 11/09/20  HPI: Patient is a 65 y.o. female with newly diagnosed multiple myeloma. Currently treated with Dara-VRd, started on 11/02/20. She has an upcoming appt on 9/19 for transplant evaluation at [facility]  Reason for Consult: Oral chemotherapy follow-up for lenalidomide therapy.   PAST MEDICAL HISTORY: Past Medical History:  Diagnosis Date   Anxiety    Diabetes mellitus without complication ([place]) 4665   GERD (gastroesophageal reflux disease)    Hyperlipidemia    Personal history of colonic polyps    Squamous cell carcinoma of skin 11/25/2013   Left chest. WD SCC with superficial infiltration.   Tachycardia     HEMATOLOGY/ONCOLOGY HISTORY:  Oncology History  Lambda light chain myeloma ([place])  08/24/2020 Initial Diagnosis   Lambda light chain myeloma ([place])   09/16/2020 Cancer Staging   Staging form: Plasma Cell Myeloma and Plasma Cell Disorders, AJCC 8th Edition - Clinical stage from 09/16/2020: RISS Stage I (Beta-2-microglobulin (mg/L): 2.5, Albumin (g/dL): 4.9, ISS: Stage I, High-risk cytogenetics: Absent, LDH: Normal) - Signed by Lloyd Huger, MD on 09/16/2020 Beta 2 microglobulin range (mg/L): Less than 3.5 Albumin range (g/dL): Greater than or equal to 3.5 Cytogenetics: 1p deletion   11/02/2020 -  Chemotherapy    Patient is on Treatment Plan: MYELOMA NEWLY DIAGNOSED TRANSPLANT CANDIDATE DARAVRD (DARATUMUMAB SQ) Q21D X 6 CYCLES (INDUCTION/CONSOLIDATION)         ALLERGIES:  has No Known Allergies.  MEDICATIONS:  Current Outpatient Medications  Medication Sig Dispense Refill   acyclovir (ZOVIRAX) 400 MG tablet Take 1 tablet (400 mg total) by mouth  2 (two) times daily. 60 tablet 5   ASPIRIN 81 PO Take 81 mg by mouth daily.     atorvastatin (LIPITOR) 20 MG tablet Take 20 mg by mouth daily.     busPIRone (BUSPAR) 30 MG tablet Take 30 mg by mouth 2 (two) times daily.     CALCIUM-VITAMIN D PO Take 1 tablet by mouth daily.     cyclobenzaprine (FLEXERIL) 10 MG tablet Take 10 mg by mouth at bedtime.     dexamethasone (DECADRON) 4 MG tablet Take 5 tabs (20 mg) weekly the day after daratumumab for 12 weeks. Take with breakfast.     lenalidomide (REVLIMID) 25 MG capsule Take 1 capsule (25 mg total) by mouth daily. Take for 14 days, then hold for 7 days. Repeat every 21 days. 14 capsule 0   lidocaine-prilocaine (EMLA) cream Apply 1 application topically as needed. 30 g 2   liraglutide (VICTOZA) 18 MG/3ML SOPN Inject 1.2 mg into the skin daily.     meloxicam (MOBIC) 15 MG tablet Take 15 mg by mouth daily.     metFORMIN (GLUCOPHAGE) 500 MG tablet Take 1,000 mg by mouth 2 (two) times daily with a meal.     methocarbamol (ROBAXIN) 500 MG tablet Take 1 tablet (500 mg total) by mouth every 6 (six) hours as needed for muscle spasms. 40 tablet 1   metoprolol succinate (TOPROL-XL) 50 MG 24 hr tablet Take 50 mg by mouth 2 (two) times daily. Take with or immediately following a meal.     Misc Natural Products (GLUCOSAMINE CHOND CMP TRIPLE) TABS Take 1 tablet by mouth daily.  morphine (MS CONTIN) 15 MG 12 hr tablet Take 1 tablet (15 mg total) by mouth every 12 (twelve) hours. 60 tablet 0   Multiple Vitamin (MULTIVITAMIN WITH MINERALS) TABS tablet Take 1 tablet by mouth daily.     omeprazole (PRILOSEC) 20 MG capsule Take 20 mg by mouth daily.     ondansetron (ZOFRAN) 8 MG tablet Take 1 tablet (8 mg total) by mouth 2 (two) times daily as needed (Nausea or vomiting). 60 tablet 2   oxyCODONE-acetaminophen (PERCOCET/ROXICET) 5-325 MG tablet Take 1-2 tablets by mouth every 4 (four) hours as needed for severe pain. 90 tablet 0   prochlorperazine (COMPAZINE) 10 MG  tablet Take 1 tablet (10 mg total) by mouth every 6 (six) hours as needed (Nausea or vomiting). 60 tablet 2   traMADol (ULTRAM) 50 MG tablet Take 1 tablet (50 mg total) by mouth 2 (two) times daily as needed for moderate pain. 60 tablet 0   No current facility-administered medications for this visit.   Facility-Administered Medications Ordered in Other Visits  Medication Dose Route Frequency Provider Last Rate Last Admin   heparin lock flush 100 unit/mL  500 Units Intracatheter Once PRN Lloyd Huger, MD       sodium chloride flush (NS) 0.9 % injection 10 mL  10 mL Intracatheter Once PRN Lloyd Huger, MD        VITAL SIGNS: There were no vitals taken for this visit. There were no vitals filed for this visit.  Estimated body mass index is 26.76 kg/m as calculated from the following:   Height as of 10/28/20: _0  (1.575 m).   Weight as of 11/02/20: 66.4 kg (146 lb 5 oz).  LABS: CBC:    Component Value Date/Time   WBC 4.2 11/02/2020 0943   HGB 9.6 (L) 11/02/2020 0943   HCT 29.4 (L) 11/02/2020 0943   PLT 191 11/02/2020 0943   MCV 98.0 11/02/2020 0943   NEUTROABS 3.2 11/02/2020 0943   LYMPHSABS 0.5 (L) 11/02/2020 0943   MONOABS 0.4 11/02/2020 0943   EOSABS 0.1 11/02/2020 0943   BASOSABS 0.0 11/02/2020 0943   Comprehensive Metabolic Panel:    Component Value Date/Time   NA 136 11/02/2020 0943   K 3.4 (L) 11/02/2020 0943   CL 94 (L) 11/02/2020 0943   CO2 31 11/02/2020 0943   BUN 23 11/02/2020 0943   CREATININE 1.67 (H) 11/02/2020 0943   GLUCOSE 118 (H) 11/02/2020 0943   CALCIUM 13.8 (HH) 11/02/2020 0943   AST 27 11/02/2020 0943   ALT 20 11/02/2020 0943   ALKPHOS 92 11/02/2020 0943   BILITOT 0.4 11/02/2020 0943   PROT 6.6 11/02/2020 0943   ALBUMIN 3.9 11/02/2020 0943     Present during today's visit: patient and her husband  Assessment and Plan: Today is C1D8 Reviewed CMP/CBC, continue remaining days of lenalidomide 59m (CBC stable overall). With cycle 2  lenalidomide will be reduced to 178mfor current renal impairment.    Oral Chemotherapy Side Effect/Intolerance:  Fatigue: overall her fatigue is much improved from last week, but still present Rash: no lenalidomide rash present but she did notice injection site redness Nausea: reported some nausea that responded well to prochlorperazine Constipation: she was feeling a bit constipated and used Miralax, this caused her to have diarrhea. We discussed cutting back on the dose of Miralax if constipation occurs again   Oral Chemotherapy Adherence: no missed doses No patient barriers to medication adherence identified.   New medications: none reported  Medication  Access Issues: none reported, lenalidomide 53m refill rx sent to [facility]  Patient expressed understanding and was in agreement with this plan. She also understands that She can call clinic at any time with any questions, concerns, or complaints.   Follow-up plan:  Thank you for allowing me to participate in the care of this very pleasant patient.   Time Total: 20 mins  Visit consisted of counseling and education on dealing with issues of symptom management in the setting of serious and potentially life-threatening illness.Greater than 50%  of this time was spent counseling and coordinating care related to the above assessment and plan.  Signed by: ADarl Pikes PharmD, BCPS, BSalley Slaughter CPP Hematology/Oncology Clinical Pharmacist Practitioner [phone] 11/09/2020 9:04 AM

## 2020-11-09 NOTE — Patient Instructions (Signed)
[facility] ONCOLOGY   Discharge Instructions: Thank you for choosing [facility] to provide your oncology and hematology care.  If you have a lab appointment with the [facility], please go directly to the [facility] and check in at the registration area.  Wear comfortable clothing and clothing appropriate for easy access to any Portacath or PICC line.   We strive to give you quality time with your provider. You may need to reschedule your appointment if you arrive late (15 or more minutes).  Arriving late affects you and other patients whose appointments are after yours.  Also, if you miss three or more appointments without notifying the office, you may be dismissed from the clinic at the provider's discretion.      For prescription refill requests, have your pharmacy contact our office and allow 72 hours for refills to be completed.    Today you received the following chemotherapy and/or immunotherapy agents: Darzalex Faspro and Velcade.      To help prevent nausea and vomiting after your treatment, we encourage you to take your nausea medication as directed.  BELOW ARE SYMPTOMS THAT SHOULD BE REPORTED IMMEDIATELY: *FEVER GREATER THAN 100.4 F (38 C) OR HIGHER *CHILLS OR SWEATING *NAUSEA AND VOMITING THAT IS NOT CONTROLLED WITH YOUR NAUSEA MEDICATION *UNUSUAL SHORTNESS OF BREATH *UNUSUAL BRUISING OR BLEEDING *URINARY PROBLEMS (pain or burning when urinating, or frequent urination) *BOWEL PROBLEMS (unusual diarrhea, constipation, pain near the anus) TENDERNESS IN MOUTH AND THROAT WITH OR WITHOUT PRESENCE OF ULCERS (sore throat, sores in mouth, or a toothache) UNUSUAL RASH, SWELLING OR PAIN  UNUSUAL VAGINAL DISCHARGE OR ITCHING   Items with * indicate a potential emergency and should be followed up as soon as possible or go to the Emergency Department if any problems should occur.  Please show the CHEMOTHERAPY ALERT CARD or IMMUNOTHERAPY  ALERT CARD at check-in to the Emergency Department and triage nurse.  Should you have questions after your visit or need to cancel or reschedule your appointment, please contact [facility]  [phone] and follow the prompts.  Office hours are 8:00 a.m. to 4:30 p.m. Monday - Friday. Please note that voicemails left after 4:00 p.m. may not be returned until the following business day.  We are closed weekends and major holidays. You have access to a nurse at all times for urgent questions. Please call the main number to the clinic [phone] and follow the prompts.  For any non-urgent questions, you may also contact your provider using MyChart. We now offer e-Visits for anyone 86 and older to request care online for non-urgent symptoms. For details visit [email or web address].   Also download the MyChart app! Go to the app store, search "MyChart", open the app, select [facility], and log in with your MyChart username and password.  Due to Covid, a mask is required upon entering the hospital/clinic. If you do not have a mask, one will be given to you upon arrival. For doctor visits, patients may have 1 support person aged 65 or older with them. For treatment visits, patients cannot have anyone with them due to current Covid guidelines and our immunocompromised population.

## 2020-11-10 ENCOUNTER — Encounter: Payer: Self-pay | Admitting: Oncology

## 2020-11-10 MED ORDER — OXYCODONE-ACETAMINOPHEN 5-325 MG PO TABS: 1.0000 | ORAL_TABLET | ORAL | 0 refills | Status: DC | PRN

## 2020-11-10 MED ORDER — MORPHINE SULFATE ER 15 MG PO TBCR: 15.0000 mg | EXTENDED_RELEASE_TABLET | Freq: Two times a day (BID) | ORAL | 0 refills | Status: DC

## 2020-11-10 MED FILL — OXYCOD/APAP 5-325MG TAB: 8 days supply | Qty: 90

## 2020-11-11 DIAGNOSIS — M6530 Trigger finger, unspecified finger: Secondary | ICD-10-CM | POA: Insufficient documentation

## 2020-11-12 ENCOUNTER — Other Ambulatory Visit: Payer: Self-pay

## 2020-11-12 ENCOUNTER — Ambulatory Visit: Payer: BC Managed Care – PPO | Admitting: Radiation Oncology

## 2020-11-12 ENCOUNTER — Inpatient Hospital Stay: Payer: BC Managed Care – PPO

## 2020-11-12 VITALS — BP 121/71 | HR 97 | Temp 96.4°F | Resp 20

## 2020-11-12 DIAGNOSIS — C9000 Multiple myeloma not having achieved remission: Secondary | ICD-10-CM

## 2020-11-12 DIAGNOSIS — Z5112 Encounter for antineoplastic immunotherapy: Secondary | ICD-10-CM | POA: Diagnosis not present

## 2020-11-12 MED ORDER — BORTEZOMIB CHEMO SQ INJECTION 3.5 MG (2.5MG/ML)
1.3000 mg/m2 | Freq: Once | INTRAMUSCULAR | Status: AC
  Administered 2020-11-12: 2.25 mg via SUBCUTANEOUS
  Filled 2020-11-12: qty 0.9

## 2020-11-12 MED ORDER — PROCHLORPERAZINE MALEATE 10 MG PO TABS
10.0000 mg | ORAL_TABLET | Freq: Once | ORAL | Status: AC
  Administered 2020-11-12: 10 mg via ORAL
  Filled 2020-11-12: qty 1

## 2020-11-12 MED FILL — METHOCARBAM 500MG TAB: 10 days supply | Qty: 40

## 2020-11-12 NOTE — Progress Notes (Signed)
Pt tolerated velcade injection well with no problems or complaints.  Pt left infusion suite stable and ambulatory.

## 2020-11-12 NOTE — Patient Instructions (Signed)
[facility] ONCOLOGY  Discharge Instructions: Thank you for choosing [facility] to provide your oncology and hematology care.  If you have a lab appointment with the [facility], please go directly to the [facility] and check in at the registration area.  Wear comfortable clothing and clothing appropriate for easy access to any Portacath or PICC line.   We strive to give you quality time with your provider. You may need to reschedule your appointment if you arrive late (15 or more minutes).  Arriving late affects you and other patients whose appointments are after yours.  Also, if you miss three or more appointments without notifying the office, you may be dismissed from the clinic at the provider's discretion.      For prescription refill requests, have your pharmacy contact our office and allow 72 hours for refills to be completed.    Today you received the following chemotherapy and/or immunotherapy agents velcade      To help prevent nausea and vomiting after your treatment, we encourage you to take your nausea medication as directed.  BELOW ARE SYMPTOMS THAT SHOULD BE REPORTED IMMEDIATELY: *FEVER GREATER THAN 100.4 F (38 C) OR HIGHER *CHILLS OR SWEATING *NAUSEA AND VOMITING THAT IS NOT CONTROLLED WITH YOUR NAUSEA MEDICATION *UNUSUAL SHORTNESS OF BREATH *UNUSUAL BRUISING OR BLEEDING *URINARY PROBLEMS (pain or burning when urinating, or frequent urination) *BOWEL PROBLEMS (unusual diarrhea, constipation, pain near the anus) TENDERNESS IN MOUTH AND THROAT WITH OR WITHOUT PRESENCE OF ULCERS (sore throat, sores in mouth, or a toothache) UNUSUAL RASH, SWELLING OR PAIN  UNUSUAL VAGINAL DISCHARGE OR ITCHING   Items with * indicate a potential emergency and should be followed up as soon as possible or go to the Emergency Department if any problems should occur.  Please show the CHEMOTHERAPY ALERT CARD or IMMUNOTHERAPY ALERT CARD at check-in to  the Emergency Department and triage nurse.  Should you have questions after your visit or need to cancel or reschedule your appointment, please contact [facility]  [phone] and follow the prompts.  Office hours are 8:00 a.m. to 4:30 p.m. Monday - Friday. Please note that voicemails left after 4:00 p.m. may not be returned until the following business day.  We are closed weekends and major holidays. You have access to a nurse at all times for urgent questions. Please call the main number to the clinic [phone] and follow the prompts.  For any non-urgent questions, you may also contact your provider using MyChart. We now offer e-Visits for anyone 64 and older to request care online for non-urgent symptoms. For details visit [email or web address].   Also download the MyChart app! Go to the app store, search "MyChart", open the app, select [facility], and log in with your MyChart username and password.  Due to Covid, a mask is required upon entering the hospital/clinic. If you do not have a mask, one will be given to you upon arrival. For doctor visits, patients may have 1 support person aged 5 or older with them. For treatment visits, patients cannot have anyone with them due to current Covid guidelines and our immunocompromised population.   Bortezomib injection What is this medication? BORTEZOMIB (bor TEZ oh mib) targets proteins in cancer cells and stops the cancer cells from growing. It treats multiple myeloma and mantle cell lymphoma. This medicine may be used for other purposes; ask your health care provider or pharmacist if you have questions. COMMON BRAND NAME(S): Velcade What  should I tell my care team before I take this medication? They need to know if you have any of these conditions: dehydration diabetes (high blood sugar) heart disease liver disease tingling of the fingers or toes or other nerve disorder an unusual or allergic reaction  to bortezomib, mannitol, boron, other medicines, foods, dyes, or preservatives pregnant or trying to get pregnant breast-feeding How should I use this medication? This medicine is injected into a vein or under the skin. It is given by a health care provider in a hospital or clinic setting. Talk to your health care provider about the use of this medicine in children. Special care may be needed. Overdosage: If you think you have taken too much of this medicine contact a poison control center or emergency room at once. NOTE: This medicine is only for you. Do not share this medicine with others. What if I miss a dose? Keep appointments for follow-up doses. It is important not to miss your dose. Call your health care provider if you are unable to keep an appointment. What may interact with this medication? This medicine may interact with the following medications: ketoconazole rifampin This list may not describe all possible interactions. Give your health care provider a list of all the medicines, herbs, non-prescription drugs, or dietary supplements you use. Also tell them if you smoke, drink alcohol, or use illegal drugs. Some items may interact with your medicine. What should I watch for while using this medication? Your condition will be monitored carefully while you are receiving this medicine. You may need blood work done while you are taking this medicine. You may get drowsy or dizzy. Do not drive, use machinery, or do anything that needs mental alertness until you know how this medicine affects you. Do not stand up or sit up quickly, especially if you are an older patient. This reduces the risk of dizzy or fainting spells This medicine may increase your risk of getting an infection. Call your health care provider for advice if you get a fever, chills, sore throat, or other symptoms of a cold or flu. Do not treat yourself. Try to avoid being around people who are sick. Check with your health  care provider if you have severe diarrhea, nausea, and vomiting, or if you sweat a lot. The loss of too much body fluid may make it dangerous for you to take this medicine. Do not become pregnant while taking this medicine or for 7 months after stopping it. Women should inform their health care provider if they wish to become pregnant or think they might be pregnant. Men should not father a child while taking this medicine and for 4 months after stopping it. There is a potential for serious harm to an unborn child. Talk to your health care provider for more information. Do not breast-feed an infant while taking this medicine or for 2 months after stopping it. This medicine may make it more difficult to get pregnant or father a child. Talk to your health care provider if you are concerned about your fertility. What side effects may I notice from receiving this medication? Side effects that you should report to your doctor or health care professional as soon as possible: allergic reactions (skin rash; itching or hives; swelling of the face, lips, or tongue) bleeding (bloody or black, tarry stools; red or dark brown urine; spitting up blood or brown material that looks like coffee grounds; red spots on the skin; unusual bruising or bleeding from the eye,  gums, or nose) blurred vision or changes in vision confusion constipation headache heart failure (trouble breathing; fast, irregular heartbeat; sudden weight gain; swelling of the ankles, feet, hands) infection (fever, chills, cough, sore throat, pain or trouble passing urine) lack or loss of appetite liver injury (dark yellow or brown urine; general ill feeling or flu-like symptoms; loss of appetite, right upper belly pain; yellowing of the eyes or skin) low blood pressure (dizziness; feeling faint or lightheaded, falls; unusually weak or tired) muscle cramps pain, redness, or irritation at site where injected pain, tingling, numbness in the hands or  feet seizures trouble breathing unusual bruising or bleeding Side effects that usually do not require medical attention (report to your doctor or health care professional if they continue or are bothersome): diarrhea nausea stomach pain trouble sleeping vomiting This list may not describe all possible side effects. Call your doctor for medical advice about side effects. You may report side effects to FDA at 1-800-FDA-1088. Where should I keep my medication? This medicine is given in a hospital or clinic. It will not be stored at home. NOTE: This sheet is a summary. It may not cover all possible information. If you have questions about this medicine, talk to your doctor, pharmacist, or health care provider.  2022 Elsevier/Gold Standard (2020-02-05 13:22:53)

## 2020-11-13 MED FILL — VICTOZA 3'S 18MG/3ML PEN: 30 days supply | Qty: 6

## 2020-11-13 MED FILL — ATORVASTATIN 20MG TAB: 60 days supply | Qty: 60

## 2020-11-13 MED FILL — METFORMIN 500MG TAB: 90 days supply | Qty: 360

## 2020-11-16 ENCOUNTER — Other Ambulatory Visit: Payer: Self-pay

## 2020-11-16 ENCOUNTER — Inpatient Hospital Stay: Payer: BC Managed Care – PPO

## 2020-11-16 DIAGNOSIS — C9000 Multiple myeloma not having achieved remission: Secondary | ICD-10-CM

## 2020-11-16 DIAGNOSIS — Z5112 Encounter for antineoplastic immunotherapy: Secondary | ICD-10-CM | POA: Diagnosis not present

## 2020-11-16 LAB — CBC WITH DIFFERENTIAL/PLATELET
Abs Immature Granulocytes: 0.02 10*3/uL (ref 0.00–0.07)
Basophils Absolute: 0 10*3/uL (ref 0.0–0.1)
Basophils Relative: 0 %
Eosinophils Absolute: 0.2 10*3/uL (ref 0.0–0.5)
Eosinophils Relative: 8 %
HCT: 28.1 % — ABNORMAL LOW (ref 36.0–46.0)
Hemoglobin: 9.2 g/dL — ABNORMAL LOW (ref 12.0–15.0)
Immature Granulocytes: 1 %
Lymphocytes Relative: 6 %
Lymphs Abs: 0.2 10*3/uL — ABNORMAL LOW (ref 0.7–4.0)
MCH: 32.2 pg (ref 26.0–34.0)
MCHC: 32.7 g/dL (ref 30.0–36.0)
MCV: 98.3 fL (ref 80.0–100.0)
Monocytes Absolute: 0.4 10*3/uL (ref 0.1–1.0)
Monocytes Relative: 13 %
Neutro Abs: 2.1 10*3/uL (ref 1.7–7.7)
Neutrophils Relative %: 72 %
Platelets: 66 10*3/uL — ABNORMAL LOW (ref 150–400)
RBC: 2.86 MIL/uL — ABNORMAL LOW (ref 3.87–5.11)
RDW: 13.9 % (ref 11.5–15.5)
WBC: 2.9 10*3/uL — ABNORMAL LOW (ref 4.0–10.5)
nRBC: 0 % (ref 0.0–0.2)

## 2020-11-16 LAB — COMPREHENSIVE METABOLIC PANEL
ALT: 22 U/L (ref 0–44)
AST: 24 U/L (ref 15–41)
Albumin: 3.6 g/dL (ref 3.5–5.0)
Alkaline Phosphatase: 213 U/L — ABNORMAL HIGH (ref 38–126)
Anion gap: 8 (ref 5–15)
BUN: 18 mg/dL (ref 8–23)
CO2: 22 mmol/L (ref 22–32)
Calcium: 8.3 mg/dL — ABNORMAL LOW (ref 8.9–10.3)
Chloride: 107 mmol/L (ref 98–111)
Creatinine, Ser: 1.37 mg/dL — ABNORMAL HIGH (ref 0.44–1.00)
GFR, Estimated: 43 mL/min — ABNORMAL LOW (ref >60)
Glucose, Bld: 149 mg/dL — ABNORMAL HIGH (ref 70–99)
Potassium: 4.6 mmol/L (ref 3.5–5.1)
Sodium: 137 mmol/L (ref 135–145)
Total Bilirubin: 0.5 mg/dL (ref 0.3–1.2)
Total Protein: 6.6 g/dL (ref 6.5–8.1)

## 2020-11-16 NOTE — Progress Notes (Signed)
No treatment today per Dr. Grayland Ormond. Patient's plat count was 66. RTC as scheduled next week for lab/MD/treatment. Patient verbalized understanding.

## 2020-11-17 MED FILL — MORPHINE ER 15MG/12 TAB: 30 days supply | Qty: 60

## 2020-11-18 ENCOUNTER — Telehealth: Payer: Self-pay

## 2020-11-18 NOTE — Telephone Encounter (Signed)
Patient sent the following message via MyChart scheduling pool:  ----- Message ----- From: [name] Sent: 11/18/2020   9:53 AM EDT To: [facility] Subject: Appointment Changed                            I'm having trouble sending a message so I hope you get this.  When I was in Tuesday I had nervousness and pain under my right anterior ribs.  I was told to notify you if it didn't resolve.  I still have the nervousness intermittently.  Now I have aching pain under bilateral  ribs, hips, groin, upper arms and axilla.  My temp is 99.2-99.7. I took 2 covid test ( I have several at home).  They were both negative--- Tuesday evening and this morning. The Percocet and morphine help but my condition has worsened. I have used ice and heat.  Monday I spent over 4 hours sitting in the same chair at [facility] for my evaluation.  When I got up I could hardly walk. That's really the only thing I can think of that may have triggered the lower body issues. This is my week off and I'm not seen again until Tuesday. Please advise. Thank you!

## 2020-11-19 ENCOUNTER — Ambulatory Visit: Payer: BC Managed Care – PPO | Admitting: Radiation Oncology

## 2020-11-19 ENCOUNTER — Other Ambulatory Visit: Payer: Self-pay

## 2020-11-19 ENCOUNTER — Inpatient Hospital Stay (HOSPITAL_BASED_OUTPATIENT_CLINIC_OR_DEPARTMENT_OTHER): Payer: BC Managed Care – PPO | Admitting: Oncology

## 2020-11-19 ENCOUNTER — Inpatient Hospital Stay: Payer: BC Managed Care – PPO

## 2020-11-19 VITALS — BP 144/67 | HR 92 | Temp 97.5°F | Resp 16 | Wt 145.0 lb

## 2020-11-19 DIAGNOSIS — Z5112 Encounter for antineoplastic immunotherapy: Secondary | ICD-10-CM | POA: Diagnosis not present

## 2020-11-19 DIAGNOSIS — Z95828 Presence of other vascular implants and grafts: Secondary | ICD-10-CM

## 2020-11-19 DIAGNOSIS — M899 Disorder of bone, unspecified: Secondary | ICD-10-CM

## 2020-11-19 DIAGNOSIS — C9000 Multiple myeloma not having achieved remission: Secondary | ICD-10-CM

## 2020-11-19 DIAGNOSIS — G893 Neoplasm related pain (acute) (chronic): Secondary | ICD-10-CM

## 2020-11-19 LAB — COMPREHENSIVE METABOLIC PANEL
ALT: 25 U/L (ref 0–44)
AST: 26 U/L (ref 15–41)
Albumin: 3.5 g/dL (ref 3.5–5.0)
Alkaline Phosphatase: 278 U/L — ABNORMAL HIGH (ref 38–126)
Anion gap: 9 (ref 5–15)
BUN: 19 mg/dL (ref 8–23)
CO2: 22 mmol/L (ref 22–32)
Calcium: 7.6 mg/dL — ABNORMAL LOW (ref 8.9–10.3)
Chloride: 106 mmol/L (ref 98–111)
Creatinine, Ser: 1.32 mg/dL — ABNORMAL HIGH (ref 0.44–1.00)
GFR, Estimated: 45 mL/min — ABNORMAL LOW (ref >60)
Glucose, Bld: 114 mg/dL — ABNORMAL HIGH (ref 70–99)
Potassium: 4.2 mmol/L (ref 3.5–5.1)
Sodium: 137 mmol/L (ref 135–145)
Total Bilirubin: 0.3 mg/dL (ref 0.3–1.2)
Total Protein: 6.4 g/dL — ABNORMAL LOW (ref 6.5–8.1)

## 2020-11-19 LAB — CBC WITH DIFFERENTIAL/PLATELET
Abs Immature Granulocytes: 0.02 10*3/uL (ref 0.00–0.07)
Basophils Absolute: 0 10*3/uL (ref 0.0–0.1)
Basophils Relative: 0 %
Eosinophils Absolute: 0.2 10*3/uL (ref 0.0–0.5)
Eosinophils Relative: 7 %
HCT: 26.1 % — ABNORMAL LOW (ref 36.0–46.0)
Hemoglobin: 8.5 g/dL — ABNORMAL LOW (ref 12.0–15.0)
Immature Granulocytes: 1 %
Lymphocytes Relative: 9 %
Lymphs Abs: 0.3 10*3/uL — ABNORMAL LOW (ref 0.7–4.0)
MCH: 32.3 pg (ref 26.0–34.0)
MCHC: 32.6 g/dL (ref 30.0–36.0)
MCV: 99.2 fL (ref 80.0–100.0)
Monocytes Absolute: 0.5 10*3/uL (ref 0.1–1.0)
Monocytes Relative: 16 %
Neutro Abs: 1.9 10*3/uL (ref 1.7–7.7)
Neutrophils Relative %: 67 %
Platelets: 126 10*3/uL — ABNORMAL LOW (ref 150–400)
RBC: 2.63 MIL/uL — ABNORMAL LOW (ref 3.87–5.11)
RDW: 14.1 % (ref 11.5–15.5)
WBC: 2.8 10*3/uL — ABNORMAL LOW (ref 4.0–10.5)
nRBC: 0 % (ref 0.0–0.2)

## 2020-11-19 MED ORDER — CYCLOBENZAPRINE HCL 10 MG PO TABS: 10.0000 mg | ORAL_TABLET | Freq: Every day | ORAL | 2 refills | Status: DC

## 2020-11-19 MED ORDER — MORPHINE SULFATE ER 15 MG PO TBCR: 30.0000 mg | EXTENDED_RELEASE_TABLET | Freq: Two times a day (BID) | ORAL | 0 refills | Status: DC

## 2020-11-19 MED ORDER — HEPARIN SOD (PORK) LOCK FLUSH 100 UNIT/ML IV SOLN
500.0000 [IU] | Freq: Once | INTRAVENOUS | Status: AC
  Administered 2020-11-19: 500 [IU] via INTRAVENOUS
  Filled 2020-11-19: qty 5

## 2020-11-19 MED FILL — CYCLOBENZAPR 10MG TAB: 30 days supply | Qty: 30

## 2020-11-19 NOTE — Progress Notes (Signed)
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  She had a bone marrow biopsy on 08/26/2020 which revealed 25% plasma cells along with deletion of 1P which is considered to be a poor prognosis.  She had a PET scan on 09/06/2020 which showed 3 hypermetabolic lesions in right C5-6 facets right iliac crest region and L3 vertebral lesion.  She is started on daratumumab, Velcade, Revlimid and dexamethasone approximately 2 weeks ago.  She will then have an [facility] [place] bone marrow transplant.  Plan is for weekly daratumumab for the first 3-4 cycles along with Velcade on days 1, 4, 8 and 11.  She will then receive Revlimid on days 1 through 14 of a 21-day cycle.  Plan is to do for 6 cycles prior to bone cell transplant at [facility].  Interval history-Hannah Mcdonald was last here for treatment on 11/09/2020.  Treatment was held earlier this week d/t low platelet count. Since then she was seen at [facility] for initial consultation for bone marrow transplant on 11/15/2020.  She states she had to sit for over 4 hours and could barely walk when it was time to leave.  She feels she has been very weak and unsteady on her feet since.  Reports a low-grade temperature of 99.2-99.7.  Reports pain/achiness under bilateral ribs, hips, groin, upper arms and axilla.  Reports some anxiety.  She has been using Percocet and morphine along with  ice and heat.   ECOG FS:2 - Symptomatic, <50% confined to bed  Review of systems- Review of Systems  Constitutional:  Positive for malaise/fatigue. Negative for chills, fever and weight loss.  HENT:  Negative for congestion, ear pain and tinnitus.   Eyes: Negative.  Negative for blurred vision and double vision.  Respiratory: Negative.  Negative for cough, sputum production and shortness of breath.   Cardiovascular: Negative.  Negative for chest pain, palpitations and leg swelling.  Gastrointestinal: Negative.  Negative for abdominal pain, constipation, diarrhea, nausea and vomiting.  Genitourinary:  Negative for dysuria, frequency and urgency.  Musculoskeletal:  Positive for back pain, joint pain and myalgias. Negative for falls.  Skin: Negative.  Negative for rash.  Neurological:  Positive for weakness. Negative for headaches.  Endo/Heme/Allergies: Negative.  Does not bruise/bleed easily.  Psychiatric/Behavioral:  Negative for depression. The patient is nervous/anxious and has insomnia.  Man  Current treatment- DVRd s/p cycle 1  No Known Allergies   Past Medical History:  Diagnosis Date   Anxiety    Diabetes mellitus without complication ([place]) 0539   GERD (gastroesophageal reflux disease)    Hyperlipidemia    Personal history of colonic polyps    Squamous cell carcinoma of skin 11/25/2013   Left chest. WD SCC with superficial infiltration.   Tachycardia      Past Surgical History:  Procedure Laterality Date   AUGMENTATION MAMMAPLASTY Bilateral 7673   silicone and replacement in [place]  [place], 1998,2003, 2008, 2013   COLONOSCOPY WITH PROPOFOL N/A 08/16/2016   Procedure: COLONOSCOPY WITH PROPOFOL;  Surgeon: Robert Bellow, MD;  Location: [facility] ENDOSCOPY;  Service: Endoscopy;  Laterality: N/A;   DILATION AND CURETTAGE OF UTERUS     ENDOMETRIAL ABLATION  12/1991   ganglion cyst removal       IR IMAGING GUIDED PORT INSERTION  10/27/2020   KYPHOPLASTY N/A 10/28/2020   Procedure: T12 and L3 KYPHOPLASTY;  Surgeon: Hessie Knows, MD;  Location: [facility] ORS;  Service: Orthopedics;  Laterality: N/A;   TONSILLECTOMY     WRIST SURGERY Right 05/1995    Social History   Socioeconomic History   Marital status: Married    Spouse name: Not on file   Number of children: Not on file   Years of education: Not on file   Highest education level: Not on file  Occupational History   Not on file  Tobacco Use   Smoking status: Former    Packs/day: 1.00    Years: 4.00    Pack years: 4.00    Types: Cigarettes    Quit date: 28    Years since quitting: 40.7   Smokeless tobacco: Never  Substance and Sexual Activity   Alcohol use: Yes    Alcohol/week: 1.0 - 2.0 standard drink    Types: 1 - 2 Glasses of wine per week    Comment: occassionally   Drug use: No   Sexual activity: Not on file  Other Topics Concern   Not on file  Social History Narrative   Not on file   Social Determinants of Health   Financial Resource Strain: Not on file  Food Insecurity: Not on file  Transportation Needs: Not on file  Physical Activity: Not on file  Stress: Not on file  Social Connections: Not on file  Intimate Partner Violence: Not on file    Family History  Problem Relation Age of Onset   Colon polyps Sister    Colon cancer Father 30   Diabetes Mother    Breast cancer Neg Hx      Current Outpatient Medications:    acyclovir (ZOVIRAX) 400 MG tablet, Take 1 tablet (400 mg total) by mouth 2 (two) times daily., Disp: 60 tablet, Rfl: 5   ASPIRIN 81 PO, Take 81 mg by mouth daily., Disp: , Rfl:    atorvastatin (LIPITOR) 20 MG tablet, Take 20 mg by mouth daily., Disp: , Rfl:    busPIRone (BUSPAR) 30 MG tablet, Take 30 mg by mouth 2 (two) times daily., Disp: , Rfl:    CALCIUM-VITAMIN D PO, Take 1 tablet by mouth daily., Disp: , Rfl:    dexamethasone (DECADRON) 4 MG tablet, Take 5 tabs (20 mg) weekly  the day after daratumumab for 12 weeks. Take with breakfast., Disp: , Rfl:    lenalidomide (REVLIMID) 10 MG capsule, Take 1 capsule (10 mg total) by mouth daily. Take for 14 days, then hold for 7 days., Disp: 14 capsule, Rfl: 0   lidocaine-prilocaine (EMLA) cream, Apply 1 application topically as needed., Disp: 30 g, Rfl: 2   liraglutide (VICTOZA) 18 MG/3ML SOPN, Inject 1.2 mg into the skin daily., Disp: , Rfl:    meloxicam (MOBIC) 15 MG tablet, Take 15 mg by mouth daily., Disp: , Rfl:    metFORMIN (GLUCOPHAGE) 500 MG tablet, Take 1,000 mg by mouth 2 (two) times daily with a meal., Disp: , Rfl:    methocarbamol (ROBAXIN) 500  MG tablet, Take 1 tablet (500 mg total) by mouth every 6 (six) hours as needed for muscle spasms., Disp: 40 tablet, Rfl: 1   metoprolol succinate (TOPROL-XL) 50 MG 24 hr tablet, Take 50 mg by mouth 2 (two) times daily. Take with or immediately following a meal., Disp: , Rfl:    Misc Natural Products (GLUCOSAMINE CHOND CMP TRIPLE) TABS, Take 1 tablet by mouth daily., Disp: , Rfl:    Multiple Vitamin (MULTIVITAMIN WITH MINERALS) TABS tablet, Take 1 tablet by mouth daily., Disp: , Rfl:    omeprazole (PRILOSEC) 20 MG capsule, Take 20 mg by mouth daily., Disp: , Rfl:    ondansetron (ZOFRAN) 8 MG tablet, Take 1 tablet (8 mg total) by mouth 2 (two) times daily as needed (Nausea or vomiting)., Disp: 60 tablet, Rfl: 2   polyethylene glycol (MIRALAX / GLYCOLAX) 17 g packet, Take by mouth daily as needed for mild constipation., Disp: , Rfl:    prochlorperazine (COMPAZINE) 10 MG tablet, Take 1 tablet (10 mg total) by mouth every 6 (six) hours as needed (Nausea or vomiting)., Disp: 60 tablet, Rfl: 2   cyclobenzaprine (FLEXERIL) 10 MG tablet, Take 1 tablet (10 mg total) by mouth at bedtime., Disp: 30 tablet, Rfl: 2   morphine (MS CONTIN) 15 MG 12 hr tablet, Take 2 tablets (30 mg total) by mouth every 12 (twelve) hours., Disp: 120 tablet, Rfl: 0   oxyCODONE-acetaminophen (PERCOCET/ROXICET)  5-325 MG tablet, TAKE 1-2 TABLETS BY MOUTH EVERY 4 HOURS AS NEEDED FOR SEVERE PAIN, Disp: 90 tablet, Rfl: 0   traMADol (ULTRAM) 50 MG tablet, TAKE 1 TABLET BY MOUTH TWICE DAILY AS NEEDED FOR MODERATE PAIN, Disp: 60 tablet, Rfl: 1 No current facility-administered medications for this visit.  Facility-Administered Medications Ordered in Other Visits:    sodium chloride flush (NS) 0.9 % injection 10 mL, 10 mL, Intravenous, PRN, Lloyd Huger, MD, 10 mL at 11/23/20 0932  Physical exam:  Vitals:   11/19/20 1349  BP: (!) 144/67  Pulse: 92  Resp: 16  Temp: (!) 97.5 F (36.4 C)  SpO2: 100%  Weight: 145 lb (65.8 kg)   Physical Exam Constitutional:      Appearance: Normal appearance.  HENT:     Head: Normocephalic and atraumatic.  Eyes:     Pupils: Pupils are equal, round, and reactive to light.  Cardiovascular:     Rate and Rhythm: Normal rate and regular rhythm.     Heart sounds: Normal heart sounds. No murmur heard. Pulmonary:     Effort: Pulmonary effort is normal.     Breath sounds: Normal breath sounds. No wheezing.  Abdominal:     General: Bowel sounds are normal. There is no distension.     Palpations: Abdomen is soft.     Tenderness: There is no abdominal tenderness.  Musculoskeletal:        General: Normal range of motion.     Cervical back: Normal range of motion.  Skin:    General: Skin is warm and dry.     Findings: No rash.  Neurological:     Mental Status: She is alert and oriented to person, place, and time.  Psychiatric:        Judgment: Judgment normal.     CMP Latest Ref Rng & Units 11/30/2020  Glucose 70 - 99 mg/dL 173(H)  BUN 8 - 23 mg/dL 14  Creatinine 0.44 - 1.00 mg/dL 1.01(H)  Sodium 135 - 145 mmol/L 135  Potassium 3.5 - 5.1 mmol/L 4.1  Chloride 98 - 111 mmol/L 104  CO2 22 - 32 mmol/L 23  Calcium 8.9 - 10.3 mg/dL 8.2(L)  Total Protein 6.5 - 8.1 g/dL 6.5  Total Bilirubin 0.3 - 1.2 mg/dL 0.3  Alkaline Phos 38 - 126 U/L 296(H)  AST 15 - 41  U/L 28  ALT 0 - 44 U/L 19   CBC Latest Ref Rng & Units 11/30/2020  WBC 4.0 - 10.5 K/uL 3.2(L)  Hemoglobin 12.0 - 15.0 g/dL 9.2(L)  Hematocrit 36.0 - 46.0 % 28.4(L)  Platelets 150 - 400 K/uL 156    No images are attached to the encounter.  No results found.   Assessment and plan- Patient is a 65 y.o. female who presents to [facility] for weakness and worsening pain.  Lambda light chain myeloma- Patient was recently diagnosed and started on cycle 1 of Revlimid, Velcade, dexamethasone plus daratumumab.  She is currently on her week off.  She was scheduled for treatment last week but her platelets were too low.  She returns to clinic next week to begin cycle 2.  She was seen at [facility] on 11/15/2020 to discuss plans for bone marrow transplant after her fourth cycle of treatment.  Malignancy related pain- Recent kyphoplasty due to T12 and L3 compression fracture secondary to multiple myeloma.  She appears to be healing well.  She is followed by Dr. Rudene Christians orthopedics.  She has not injured herself.  She is currently taking 15 mg morphine every 12 hours and Percocet 5-325 mg tablets every 4 hours as needed for pain.  We have agreed to increase her morphine to 30 mg every 12 hours and continue Percocet 1 to 2 tablets as needed for pain.  Previously she was taking Flexeril so we will refill this.  I have cautioned her that all of this medication is sedating and she needs to be mindful of what she is taking.  Her husband is a Software engineer and has been in control of most of the medication.  He believes with the extended release morphine, she will need less short acting narcotics.  Patient to call clinic if symptoms do not improve.  Disposition- RTC as scheduled.   Visit Diagnosis No diagnosis found.  Patient expressed understanding and was in agreement with this plan. She also understands that She can call clinic at any time with any questions, concerns, or complaints.   Greater than 50% was spent in counseling  and coordination of care with this patient including but not limited to discussion of the relevant topics above (See A&P) including, but not limited to diagnosis and management of acute and chronic medical conditions.   Thank you for allowing me to participate in the care of this very pleasant patient.    Jacquelin Hawking, NP [place] [facility] Cell - [phone] Pager- [phone] 12/02/2020 4:12 PM

## 2020-11-19 NOTE — Progress Notes (Signed)
Pt c/o bilateral rib pain under both breasts, bilateral hip/groin pain, and reports new onset of bilateral shoulder/arm pain. Pt states that she was unable to get her infusion on Thursday. Pt also reports difficulty walking yesterday and needed assistance. Pt ambulatory to room independently at this time.

## 2020-11-20 NOTE — Progress Notes (Signed)
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  INTERVAL HISTORY: Patient returns to clinic today for further evaluation and consideration of cycle 2, day 1 of DVRd.  She was seen in symptom management clinic earlier this week for rib/back pain, but this is improved.  She continues to have chronic weakness and fatigue.  She has no further falls or tremors.  She does not report any further confusion.  She has no neurologic complaints.  She denies any recent fevers.  She has no chest pain, shortness of breath, cough, or hemoptysis.  She denies any nausea, vomiting, constipation, or diarrhea.  She has no urinary complaints.  Patient offers no further specific complaints today.  REVIEW OF SYSTEMS:   Review of Systems  Constitutional:  Positive for malaise/fatigue. Negative for fever and weight loss.  Respiratory: Negative.  Negative for cough, hemoptysis and shortness of breath.   Cardiovascular: Negative.  Negative for chest pain and leg swelling.  Gastrointestinal: Negative.  Negative for abdominal pain.  Genitourinary: Negative.  Negative for dysuria.  Musculoskeletal:  Positive for back pain. Negative for falls and joint pain.  Skin: Negative.  Negative for rash.  Neurological:  Positive for tremors. Negative for dizziness, focal weakness, weakness and headaches.  Psychiatric/Behavioral: Negative.  The patient is not nervous/anxious.    As per HPI. Otherwise, a complete review of systems is negative.  PAST MEDICAL HISTORY: Past Medical History:  Diagnosis Date   Anxiety    Diabetes mellitus without complication ([place]) 1324   GERD (gastroesophageal reflux disease)    Hyperlipidemia    Personal  history of colonic polyps    Squamous cell carcinoma of skin 11/25/2013   Left chest. WD SCC with superficial infiltration.   Tachycardia     PAST SURGICAL HISTORY: Past Surgical History:  Procedure Laterality Date   AUGMENTATION MAMMAPLASTY Bilateral 4010   silicone and replacement in 2001   [place], [place], 1998,2003, 2008, 2013   COLONOSCOPY WITH PROPOFOL N/A 08/16/2016   Procedure: COLONOSCOPY WITH PROPOFOL;  Surgeon: Robert Bellow, MD;  Location: [facility] ENDOSCOPY;  Service: Endoscopy;  Laterality: N/A;   DILATION AND CURETTAGE OF UTERUS     ENDOMETRIAL ABLATION  12/1991   ganglion cyst removal      IR IMAGING GUIDED PORT INSERTION  10/27/2020   KYPHOPLASTY N/A 10/28/2020   Procedure: T12 and L3 KYPHOPLASTY;  Surgeon: Hessie Knows, MD;  Location: [facility] ORS;  Service: Orthopedics;  Laterality: N/A;   TONSILLECTOMY     WRIST SURGERY Right 05/1995    FAMILY HISTORY: Family History  Problem Relation Age of Onset   Colon polyps Sister    Colon cancer Father 75   Diabetes Mother    Breast cancer Neg Hx     ADVANCED DIRECTIVES (Y/N):  N  HEALTH MAINTENANCE: Social History   Tobacco Use   Smoking status: Former    Packs/day: 1.00    Years: 4.00    Pack years: 4.00    Types: Cigarettes    Quit date: 1982    Years since quitting: 40.7   Smokeless tobacco: Never  Substance Use Topics   Alcohol use: Yes  Alcohol/week: 1.0 - 2.0 standard drink    Types: 1 - 2 Glasses of wine per week    Comment: occassionally   Drug use: No     Colonoscopy:  PAP:  Bone density:  Lipid panel:  No Known Allergies  Current Outpatient Medications  Medication Sig Dispense Refill   acyclovir (ZOVIRAX) 400 MG tablet Take 1 tablet (400 mg total) by mouth 2 (two) times daily. 60 tablet 5   ASPIRIN 81 PO Take 81 mg by mouth daily.     atorvastatin (LIPITOR) 20 MG tablet Take 20 mg by mouth daily.     busPIRone  (BUSPAR) 30 MG tablet Take 30 mg by mouth 2 (two) times daily.     CALCIUM-VITAMIN D PO Take 1 tablet by mouth daily.     cyclobenzaprine (FLEXERIL) 10 MG tablet Take 1 tablet (10 mg total) by mouth at bedtime. 30 tablet 2   dexamethasone (DECADRON) 4 MG tablet Take 5 tabs (20 mg) weekly the day after daratumumab for 12 weeks. Take with breakfast.     lenalidomide (REVLIMID) 10 MG capsule Take 1 capsule (10 mg total) by mouth daily. Take for 14 days, then hold for 7 days. 14 capsule 0   lidocaine-prilocaine (EMLA) cream Apply 1 application topically as needed. 30 g 2   liraglutide (VICTOZA) 18 MG/3ML SOPN Inject 1.2 mg into the skin daily.     meloxicam (MOBIC) 15 MG tablet Take 15 mg by mouth daily.     metFORMIN (GLUCOPHAGE) 500 MG tablet Take 1,000 mg by mouth 2 (two) times daily with a meal.     methocarbamol (ROBAXIN) 500 MG tablet Take 1 tablet (500 mg total) by mouth every 6 (six) hours as needed for muscle spasms. 40 tablet 1   metoprolol succinate (TOPROL-XL) 50 MG 24 hr tablet Take 50 mg by mouth 2 (two) times daily. Take with or immediately following a meal.     Misc Natural Products (GLUCOSAMINE CHOND CMP TRIPLE) TABS Take 1 tablet by mouth daily.     morphine (MS CONTIN) 15 MG 12 hr tablet Take 2 tablets (30 mg total) by mouth every 12 (twelve) hours. 120 tablet 0   Multiple Vitamin (MULTIVITAMIN WITH MINERALS) TABS tablet Take 1 tablet by mouth daily.     omeprazole (PRILOSEC) 20 MG capsule Take 20 mg by mouth daily.     ondansetron (ZOFRAN) 8 MG tablet Take 1 tablet (8 mg total) by mouth 2 (two) times daily as needed (Nausea or vomiting). 60 tablet 2   oxyCODONE-acetaminophen (PERCOCET/ROXICET) 5-325 MG tablet Take 1-2 tablets by mouth every 4 (four) hours as needed for severe pain. 90 tablet 0   polyethylene glycol (MIRALAX / GLYCOLAX) 17 g packet Take by mouth daily as needed for mild constipation.     prochlorperazine (COMPAZINE) 10 MG tablet Take 1 tablet (10 mg total) by mouth  every 6 (six) hours as needed (Nausea or vomiting). 60 tablet 2   traMADol (ULTRAM) 50 MG tablet Take 1 tablet (50 mg total) by mouth 2 (two) times daily as needed for moderate pain. 60 tablet 0   No current facility-administered medications for this visit.   Facility-Administered Medications Ordered in Other Visits  Medication Dose Route Frequency Provider Last Rate Last Admin   bortezomib SQ (VELCADE) chemo injection (2.70m/mL concentration) 2.25 mg  1.3 mg/m2 (Treatment Plan Recorded) Subcutaneous Once FLloyd Huger MD       daratumumab-hyaluronidase-fihj ([facility]) 1800-30000 MG-UT/15ML chemo SQ injection 1,800 mg  1,800  mg Subcutaneous Once Lloyd Huger, MD       heparin lock flush 100 unit/mL  500 Units Intravenous Once Lloyd Huger, MD       sodium chloride flush (NS) 0.9 % injection 10 mL  10 mL Intravenous PRN Lloyd Huger, MD   10 mL at 11/23/20 0932    OBJECTIVE: Vitals:   11/23/20 0942  BP: (!) 129/96  Pulse: 86  Resp: 18  Temp: (!) 96.8 F (36 C)  SpO2: 97%     Body mass index is 25.61 kg/m.    ECOG FS:1 - Symptomatic but completely ambulatory  General: Well-developed, well-nourished, no acute distress. Eyes: Pink conjunctiva, anicteric sclera. HEENT: Normocephalic, moist mucous membranes. Lungs: No audible wheezing or coughing. Heart: Regular rate and rhythm. Abdomen: Soft, nontender, no obvious distention. Musculoskeletal: No edema, cyanosis, or clubbing. Neuro: Alert, answering all questions appropriately. Cranial nerves grossly intact. Skin: No rashes or petechiae noted. Psych: Normal affect.  LAB RESULTS:  Lab Results  Component Value Date   NA 138 11/23/2020   K 4.1 11/23/2020   CL 104 11/23/2020   CO2 23 11/23/2020   GLUCOSE 184 (H) 11/23/2020   BUN 14 11/23/2020   CREATININE 1.20 (H) 11/23/2020   CALCIUM 8.4 (L) 11/23/2020   PROT 6.9 11/23/2020   ALBUMIN 3.6 11/23/2020   AST 25 11/23/2020   ALT 18 11/23/2020    ALKPHOS 367 (H) 11/23/2020   BILITOT 0.5 11/23/2020   GFRNONAA 51 (L) 11/23/2020    Lab Results  Component Value Date   WBC 2.0 (L) 11/23/2020   NEUTROABS 1.1 (L) 11/23/2020   HGB 9.3 (L) 11/23/2020   HCT 28.2 (L) 11/23/2020   MCV 97.9 11/23/2020   PLT 234 11/23/2020     STUDIES: DG Lumbar Spine 2-3 Views  Result Date: 10/28/2020 CLINICAL DATA:  Surgery, elective Z41.9 (ICD-10-CM). Additional history provided: Kyphoplasty T12 and L3. Provided fluoroscopy time 3 minutes, 17 seconds (76.27 mGy). EXAM: LUMBAR SPINE - 2-3 VIEW; DG C-ARM 1-60 MIN COMPARISON:  Lumbar spine MRI 10/19/2020. FINDINGS: Two AP intraprocedural fluoroscopic images of the thoracolumbar spine are submitted. On the initial provided image, kyphoplasty material is present within the T12 vertebral body at site of a known compression fracture. On the second provided image, kyphoplasty material is demonstrated within a lumbar vertebral body (presumably the L3 vertebral body given the provided history), although the exact level is difficult to ascertain given the provided field of view. IMPRESSION: Two intraprocedural fluoroscopic images of the thoracolumbar spine from reported T12 and L3 kyphoplasty, as described. Electronically Signed   By: Kellie Simmering D.O.   On: 10/28/2020 14:08   DG C-Arm 1-60 Min  Result Date: 10/28/2020 CLINICAL DATA:  Surgery, elective Z41.9 (ICD-10-CM). Additional history provided: Kyphoplasty T12 and L3. Provided fluoroscopy time 3 minutes, 17 seconds (76.27 mGy). EXAM: LUMBAR SPINE - 2-3 VIEW; DG C-ARM 1-60 MIN COMPARISON:  Lumbar spine MRI 10/19/2020. FINDINGS: Two AP intraprocedural fluoroscopic images of the thoracolumbar spine are submitted. On the initial provided image, kyphoplasty material is present within the T12 vertebral body at site of a known compression fracture. On the second provided image, kyphoplasty material is demonstrated within a lumbar vertebral body (presumably the L3 vertebral body  given the provided history), although the exact level is difficult to ascertain given the provided field of view. IMPRESSION: Two intraprocedural fluoroscopic images of the thoracolumbar spine from reported T12 and L3 kyphoplasty, as described. Electronically Signed   By: Kellie Simmering D.O.  On: 10/28/2020 14:08   IR IMAGING GUIDED PORT INSERTION  Result Date: 10/27/2020 INDICATION: Myeloma EXAM: IMPLANTED PORT A CATH PLACEMENT WITH ULTRASOUND AND FLUOROSCOPIC GUIDANCE MEDICATIONS: None ANESTHESIA/SEDATION: Moderate (conscious) sedation was employed during this procedure. A total of Versed 2 mg and Fentanyl 100 mcg was administered intravenously. Moderate Sedation Time: 16 minutes. The patient's level of consciousness and vital signs were monitored continuously by radiology nursing throughout the procedure under my direct supervision. FLUOROSCOPY TIME:  0 minutes, 48 seconds (4.1 mGy) COMPLICATIONS: None immediate. PROCEDURE: The procedure, risks, benefits, and alternatives were explained to the patient. Questions regarding the procedure were encouraged and answered. The patient understands and consents to the procedure. A timeout was performed prior to the initiation of the procedure. Patient positioned supine on the angiography table. Right neck and anterior upper chest prepped and draped in the usual sterile fashion. All elements of maximal sterile barrier were utilized including, cap, mask, sterile gown, sterile gloves, large sterile drape, hand scrubbing and 2% Chlorhexidine for skin cleaning. The right internal jugular vein was evaluated with ultrasound and shown to be patent. A permanent ultrasound image was obtained and placed in the patient's medical record. Local anesthesia was provided with 1% lidocaine with epinephrine. Using sterile gel and a sterile probe cover, the right internal jugular vein was entered with a 21 ga needle during real time ultrasound guidance. 0.018 inch guidewire placed and 21  ga needle exchanged for transitional dilator set. Utilizing fluoroscopy, 0.035 inch guidewire advanced through the needle without difficulty. Attention then turned to the right anterior upper chest. Following local lidocaine administration, a port pocket was created. The catheter was connected to the port and brought from the pocket to the venotomy site through a subcutaneous tunnel. The catheter was cut to size and inserted through the peel-away sheath. The catheter tip was positioned at the cavoatrial junction using fluoroscopic guidance. The port aspirated and flushed well. The port pocket was closed with deep and superficial absorbable suture. The port pocket incision and venotomy sites were also sealed with Dermabond. IMPRESSION: Successful placement of a right internal jugular approach power injectable Port-A-Cath. The catheter is ready for immediate use. Electronically Signed   By: Miachel Roux M.D.   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  PLAN:      1.  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  She also has elevated lambda free light chains of 432.1.  Immunoglobulins and SPEP are normal.  Bone marrow biopsy completed on August 26, 2020 revealed 25% plasma cells along with the deletion of 1p which is considered poor prognosis.  PET scan results from September 06, 2020 reviewed independently with 3 hypermetabolic lesions in right C5-6 facets, right iliac crest lesion, and L3 vertebral lesion.  After lengthy discussion with the patient, she agreed to pursue chemotherapy with daratumumab, Velcade, Revlimid, and dexamethasone followed by autologous bone marrow transplant.  Patient will receive weekly daratumumab for the first 3-4 cycles along with Velcade on days 1, 4, 8, and 11.  She will receive Revlimid on days 1 through 14 of a 21-day cycle.  Plan to do 4-6 cycles prior to autologous bone cell [facility] at [facility].  Her initial appointment at [facility] is on November 15, 2020.  Patient missed day 15 of cycle 1 secondary to thrombocytopenia.  May have to consider dose reduction or modification in the near future.  Proceed with cycle 2, day 1 of treatment today.  Return to clinic on Friday for Velcade only and then in 1 week for further evaluation and consideration of cycle 2, day 8.  2.  Elevated liver enzymes: Resolved.   3.  Pain: Patient has now completed XRT.  She had kyphoplasty x2.  Better controlled on 30 mg MS Contin every 12 hours.  Patient continues to take Percocet as needed sparingly.  4.  Hypercalcemia: Resolved.  Patient's calcium is 8.4 today.  She last received Zometa on November 02, 2020. 5.  Renal insufficiency: Creatinine improved to 1.2, monitor. 6.  Neutropenia: Proceed cautiously with treatment as above.  Consider dose reduction or modification in the near future. 7.  Anemia: Mild, monitor. 8.  Thrombocytopenia: Resolved.  Monitor. 9.  Low-grade fevers: No obvious infectious source.  Monitor.  Patient expressed understanding and was in agreement with this plan. She also understands that She can call clinic at any time with any questions, concerns, or complaints.   Cancer Staging Lambda light chain myeloma ([place]) Staging form: Plasma Cell Myeloma and Plasma Cell Disorders, AJCC 8th Edition - Clinical stage from 09/16/2020: RISS Stage I (Beta-2-microglobulin (mg/L): 2.5, Albumin (g/dL): 4.9, ISS: Stage I, High-risk cytogenetics: Absent, LDH: Normal) - Signed by Lloyd Huger, MD on 09/16/2020 Beta 2 microglobulin range (mg/L): Less than 3.5 Albumin range (g/dL): Greater than or equal to 3.5 Cytogenetics: 1p deletion  Lloyd Huger, MD   11/23/2020 11:27 AM

## 2020-11-22 ENCOUNTER — Encounter: Payer: Self-pay | Admitting: Oncology

## 2020-11-23 ENCOUNTER — Other Ambulatory Visit: Payer: Self-pay

## 2020-11-23 ENCOUNTER — Inpatient Hospital Stay: Payer: BC Managed Care – PPO

## 2020-11-23 ENCOUNTER — Inpatient Hospital Stay (HOSPITAL_BASED_OUTPATIENT_CLINIC_OR_DEPARTMENT_OTHER): Payer: BC Managed Care – PPO | Admitting: Oncology

## 2020-11-23 VITALS — BP 129/96 | HR 86 | Temp 96.8°F | Resp 18 | Wt 140.0 lb

## 2020-11-23 VITALS — BP 139/78 | HR 80 | Resp 16

## 2020-11-23 DIAGNOSIS — C9000 Multiple myeloma not having achieved remission: Secondary | ICD-10-CM

## 2020-11-23 DIAGNOSIS — Z5112 Encounter for antineoplastic immunotherapy: Secondary | ICD-10-CM | POA: Diagnosis not present

## 2020-11-23 DIAGNOSIS — Z95828 Presence of other vascular implants and grafts: Secondary | ICD-10-CM

## 2020-11-23 LAB — CBC WITH DIFFERENTIAL/PLATELET
Abs Immature Granulocytes: 0.01 10*3/uL (ref 0.00–0.07)
Basophils Absolute: 0 10*3/uL (ref 0.0–0.1)
Basophils Relative: 1 %
Eosinophils Absolute: 0.1 10*3/uL (ref 0.0–0.5)
Eosinophils Relative: 3 %
HCT: 28.2 % — ABNORMAL LOW (ref 36.0–46.0)
Hemoglobin: 9.3 g/dL — ABNORMAL LOW (ref 12.0–15.0)
Immature Granulocytes: 1 %
Lymphocytes Relative: 16 %
Lymphs Abs: 0.3 10*3/uL — ABNORMAL LOW (ref 0.7–4.0)
MCH: 32.3 pg (ref 26.0–34.0)
MCHC: 33 g/dL (ref 30.0–36.0)
MCV: 97.9 fL (ref 80.0–100.0)
Monocytes Absolute: 0.5 10*3/uL (ref 0.1–1.0)
Monocytes Relative: 24 %
Neutro Abs: 1.1 10*3/uL — ABNORMAL LOW (ref 1.7–7.7)
Neutrophils Relative %: 55 %
Platelets: 234 10*3/uL (ref 150–400)
RBC: 2.88 MIL/uL — ABNORMAL LOW (ref 3.87–5.11)
RDW: 13.6 % (ref 11.5–15.5)
WBC: 2 10*3/uL — ABNORMAL LOW (ref 4.0–10.5)
nRBC: 0 % (ref 0.0–0.2)

## 2020-11-23 LAB — COMPREHENSIVE METABOLIC PANEL
ALT: 18 U/L (ref 0–44)
AST: 25 U/L (ref 15–41)
Albumin: 3.6 g/dL (ref 3.5–5.0)
Alkaline Phosphatase: 367 U/L — ABNORMAL HIGH (ref 38–126)
Anion gap: 11 (ref 5–15)
BUN: 14 mg/dL (ref 8–23)
CO2: 23 mmol/L (ref 22–32)
Calcium: 8.4 mg/dL — ABNORMAL LOW (ref 8.9–10.3)
Chloride: 104 mmol/L (ref 98–111)
Creatinine, Ser: 1.2 mg/dL — ABNORMAL HIGH (ref 0.44–1.00)
GFR, Estimated: 51 mL/min — ABNORMAL LOW (ref >60)
Glucose, Bld: 184 mg/dL — ABNORMAL HIGH (ref 70–99)
Potassium: 4.1 mmol/L (ref 3.5–5.1)
Sodium: 138 mmol/L (ref 135–145)
Total Bilirubin: 0.5 mg/dL (ref 0.3–1.2)
Total Protein: 6.9 g/dL (ref 6.5–8.1)

## 2020-11-23 MED ORDER — SODIUM CHLORIDE 0.9% FLUSH
10.0000 mL | INTRAVENOUS | Status: AC | PRN
  Administered 2020-11-23: 10 mL via INTRAVENOUS
  Filled 2020-11-23: qty 10

## 2020-11-23 MED ORDER — BORTEZOMIB CHEMO SQ INJECTION 3.5 MG (2.5MG/ML)
1.3000 mg/m2 | Freq: Once | INTRAMUSCULAR | Status: AC
  Administered 2020-11-23: 2.25 mg via SUBCUTANEOUS
  Filled 2020-11-23: qty 0.9

## 2020-11-23 MED ORDER — DARATUMUMAB-HYALURONIDASE-FIHJ 1800-30000 MG-UT/15ML ~~LOC~~ SOLN
1800.0000 mg | Freq: Once | SUBCUTANEOUS | Status: AC
  Administered 2020-11-23: 1800 mg via SUBCUTANEOUS
  Filled 2020-11-23: qty 15

## 2020-11-23 MED ORDER — HEPARIN SOD (PORK) LOCK FLUSH 100 UNIT/ML IV SOLN
500.0000 [IU] | Freq: Once | INTRAVENOUS | Status: DC
  Filled 2020-11-23: qty 5

## 2020-11-23 MED ORDER — HEPARIN SOD (PORK) LOCK FLUSH 100 UNIT/ML IV SOLN
INTRAVENOUS | Status: AC
  Filled 2020-11-23: qty 5

## 2020-11-23 MED ORDER — DEXAMETHASONE 4 MG PO TABS
20.0000 mg | ORAL_TABLET | Freq: Once | ORAL | Status: AC
  Administered 2020-11-23: 20 mg via ORAL
  Filled 2020-11-23: qty 5

## 2020-11-23 MED ORDER — DIPHENHYDRAMINE HCL 25 MG PO CAPS
25.0000 mg | ORAL_CAPSULE | Freq: Once | ORAL | Status: AC
  Administered 2020-11-23: 25 mg via ORAL
  Filled 2020-11-23: qty 1

## 2020-11-23 MED ORDER — ACETAMINOPHEN 325 MG PO TABS
650.0000 mg | ORAL_TABLET | Freq: Once | ORAL | Status: AC
  Administered 2020-11-23: 650 mg via ORAL
  Filled 2020-11-23: qty 2

## 2020-11-23 MED ORDER — HEPARIN SOD (PORK) LOCK FLUSH 100 UNIT/ML IV SOLN
500.0000 [IU] | Freq: Once | INTRAVENOUS | Status: AC
  Administered 2020-11-23: 500 [IU] via INTRAVENOUS
  Filled 2020-11-23: qty 5

## 2020-11-23 MED FILL — DEXAMETHASON 4MG TAB: 28 days supply | Qty: 20

## 2020-11-23 MED FILL — ACYCLOVIR 400MG TAB: 30 days supply | Qty: 60

## 2020-11-23 NOTE — Progress Notes (Signed)
Alk Phos 367. Per Dr. Grayland Ormond, okay to proceed with treatment. Patient okay to discharge after injection as long as she is feeling okay per Dr. Grayland Ormond.   Patient reports mild itching at injection site at 15 minute post injection. Injection site is WNL. No erythema, edema, or pain noted. VS WNL. Advised patient we would monitor her longer. Patient verbalizes understanding and agrees.   Patient monitored approx 45 minutes post injection. Injection site is still WNL. Patient reports mild itching is still present but denies any other s/s at this time. Educated patient on s/s as to when to seek emergency treatment and advised to contact clinic should she have any questions or concerns. Patient verbalizes understanding and denies any further questions or concerns.

## 2020-11-23 NOTE — Progress Notes (Signed)
Pt reports temp of 100.3-100.4 the past 3 nights responsive to tylenol. Reports stuffy nose as well. Pt states pain has improved in hips and ribs possibly d/t increased morphine dosing.

## 2020-11-23 NOTE — Patient Instructions (Signed)
[facility] ONCOLOGY  Discharge Instructions: Thank you for choosing [facility] to provide your oncology and hematology care.  If you have a lab appointment with the [facility], please go directly to the [facility] and check in at the registration area.  Wear comfortable clothing and clothing appropriate for easy access to any Portacath or PICC line.   We strive to give you quality time with your provider. You may need to reschedule your appointment if you arrive late (15 or more minutes).  Arriving late affects you and other patients whose appointments are after yours.  Also, if you miss three or more appointments without notifying the office, you may be dismissed from the clinic at the provider's discretion.      For prescription refill requests, have your pharmacy contact our office and allow 72 hours for refills to be completed.    Today you received the following chemotherapy and/or immunotherapy agents Velcade & Darzalex      To help prevent nausea and vomiting after your treatment, we encourage you to take your nausea medication as directed.  BELOW ARE SYMPTOMS THAT SHOULD BE REPORTED IMMEDIATELY: *FEVER GREATER THAN 100.4 F (38 C) OR HIGHER *CHILLS OR SWEATING *NAUSEA AND VOMITING THAT IS NOT CONTROLLED WITH YOUR NAUSEA MEDICATION *UNUSUAL SHORTNESS OF BREATH *UNUSUAL BRUISING OR BLEEDING *URINARY PROBLEMS (pain or burning when urinating, or frequent urination) *BOWEL PROBLEMS (unusual diarrhea, constipation, pain near the anus) TENDERNESS IN MOUTH AND THROAT WITH OR WITHOUT PRESENCE OF ULCERS (sore throat, sores in mouth, or a toothache) UNUSUAL RASH, SWELLING OR PAIN  UNUSUAL VAGINAL DISCHARGE OR ITCHING   Items with * indicate a potential emergency and should be followed up as soon as possible or go to the Emergency Department if any problems should occur.  Please show the CHEMOTHERAPY ALERT CARD or IMMUNOTHERAPY ALERT CARD at  check-in to the Emergency Department and triage nurse.  Should you have questions after your visit or need to cancel or reschedule your appointment, please contact [facility]  [phone] and follow the prompts.  Office hours are 8:00 a.m. to 4:30 p.m. Monday - Friday. Please note that voicemails left after 4:00 p.m. may not be returned until the following business day.  We are closed weekends and major holidays. You have access to a nurse at all times for urgent questions. Please call the main number to the clinic [phone] and follow the prompts.  For any non-urgent questions, you may also contact your provider using MyChart. We now offer e-Visits for anyone 60 and older to request care online for non-urgent symptoms. For details visit [email or web address].   Also download the MyChart app! Go to the app store, search "MyChart", open the app, select [facility], and log in with your MyChart username and password.  Due to Covid, a mask is required upon entering the hospital/clinic. If you do not have a mask, one will be given to you upon arrival. For doctor visits, patients may have 1 support person aged 10 or older with them. For treatment visits, patients cannot have anyone with them due to current Covid guidelines and our immunocompromised population.

## 2020-11-24 MED FILL — MELOXICAM 15MG TAB: 30 days supply | Qty: 30

## 2020-11-26 ENCOUNTER — Inpatient Hospital Stay: Payer: BC Managed Care – PPO

## 2020-11-26 VITALS — BP 134/78 | HR 97 | Temp 98.0°F | Resp 19

## 2020-11-26 DIAGNOSIS — C9000 Multiple myeloma not having achieved remission: Secondary | ICD-10-CM

## 2020-11-26 DIAGNOSIS — Z5112 Encounter for antineoplastic immunotherapy: Secondary | ICD-10-CM | POA: Diagnosis not present

## 2020-11-26 MED ORDER — BORTEZOMIB CHEMO SQ INJECTION 3.5 MG (2.5MG/ML)
1.3000 mg/m2 | Freq: Once | INTRAMUSCULAR | Status: AC
  Administered 2020-11-26: 2.25 mg via SUBCUTANEOUS
  Filled 2020-11-26: qty 0.9

## 2020-11-26 MED ORDER — PROCHLORPERAZINE MALEATE 10 MG PO TABS
10.0000 mg | ORAL_TABLET | Freq: Once | ORAL | Status: AC
  Administered 2020-11-26: 10 mg via ORAL
  Filled 2020-11-26: qty 1

## 2020-11-26 NOTE — Progress Notes (Signed)
Patient brought a form to be completed for reimbursement on a cruise she has scheduled on November 2022.  Form was completed and returned to patient for her to complete patient portio and she will submit the paper work.

## 2020-11-29 ENCOUNTER — Other Ambulatory Visit: Payer: Self-pay | Admitting: Oncology

## 2020-11-29 MED FILL — OXYCOD/APAP 5-325MG TAB: 8 days supply | Qty: 90

## 2020-11-29 MED FILL — MORPHINE ER 15MG/12 TAB: 30 days supply | Qty: 120

## 2020-11-29 MED FILL — TRAMADOL HCL 50MG TAB: 30 days supply | Qty: 60

## 2020-11-29 NOTE — Progress Notes (Signed)
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  INTERVAL HISTORY: Patient returns to clinic today for further evaluation and consideration of cycle 2, day 8 of DVRd.  Her rib and back pain are evident, but improved.  She continues to have chronic weakness and fatigue.  She has no further falls or tremors.  She does not report any further confusion.  She has no neurologic complaints.  She denies any recent fevers.  She has no chest pain, shortness of breath, cough, or hemoptysis.  She denies any nausea, vomiting, constipation, or diarrhea.  She has no urinary complaints.  Patient offers no further specific complaints today.  REVIEW OF SYSTEMS:   Review of Systems  Constitutional:  Positive for malaise/fatigue. Negative for fever and weight loss.  Respiratory: Negative.  Negative for cough, hemoptysis and shortness of breath.   Cardiovascular: Negative.  Negative for chest pain and leg swelling.  Gastrointestinal: Negative.  Negative for abdominal pain.  Genitourinary: Negative.  Negative for dysuria.  Musculoskeletal:  Positive for back pain. Negative for falls and joint pain.  Skin: Negative.  Negative for rash.  Neurological:  Positive for weakness. Negative for dizziness, tremors, focal weakness and headaches.  Psychiatric/Behavioral: Negative.  The patient is not nervous/anxious.    As per HPI. Otherwise, a complete review of systems is negative.  PAST MEDICAL HISTORY: Past Medical History:  Diagnosis Date   Anxiety    Diabetes mellitus without complication ([place]) 6440   GERD (gastroesophageal reflux disease)    Hyperlipidemia    Personal history of colonic polyps    Squamous cell carcinoma  of skin 11/25/2013   Left chest. WD SCC with superficial infiltration.   Tachycardia     PAST SURGICAL HISTORY: Past Surgical History:  Procedure Laterality Date   AUGMENTATION MAMMAPLASTY Bilateral 3474   silicone and replacement in 2001   [place], [place], 1998,2003, 2008, 2013   COLONOSCOPY WITH PROPOFOL N/A 08/16/2016   Procedure: COLONOSCOPY WITH PROPOFOL;  Surgeon: Robert Bellow, MD;  Location: [facility] ENDOSCOPY;  Service: Endoscopy;  Laterality: N/A;   DILATION AND CURETTAGE OF UTERUS     ENDOMETRIAL ABLATION  12/1991   ganglion cyst removal      IR IMAGING GUIDED PORT INSERTION  10/27/2020   KYPHOPLASTY N/A 10/28/2020   Procedure: T12 and L3 KYPHOPLASTY;  Surgeon: Hessie Knows, MD;  Location: [facility] ORS;  Service: Orthopedics;  Laterality: N/A;   TONSILLECTOMY     WRIST SURGERY Right 05/1995    FAMILY HISTORY: Family History  Problem Relation Age of Onset   Colon polyps Sister    Colon cancer Father 85   Diabetes Mother    Breast cancer Neg Hx     ADVANCED DIRECTIVES (Y/N):  N  HEALTH MAINTENANCE: Social History   Tobacco Use   Smoking status: Former    Packs/day: 1.00    Years: 4.00    Pack years: 4.00    Types: Cigarettes    Quit date: 1982    Years since quitting: 40.7   Smokeless tobacco: Never  Substance Use Topics   Alcohol use: Yes    Alcohol/week: 1.0 - 2.0 standard drink  Types: 1 - 2 Glasses of wine per week    Comment: occassionally   Drug use: No     Colonoscopy:  PAP:  Bone density:  Lipid panel:  No Known Allergies  Current Outpatient Medications  Medication Sig Dispense Refill   acyclovir (ZOVIRAX) 400 MG tablet Take 1 tablet (400 mg total) by mouth 2 (two) times daily. 60 tablet 5   ASPIRIN 81 PO Take 81 mg by mouth daily.     atorvastatin (LIPITOR) 20 MG tablet Take 20 mg by mouth daily.     busPIRone (BUSPAR) 30 MG tablet Take 30 mg by mouth 2 (two) times  daily.     CALCIUM-VITAMIN D PO Take 1 tablet by mouth daily.     cyclobenzaprine (FLEXERIL) 10 MG tablet Take 1 tablet (10 mg total) by mouth at bedtime. 30 tablet 2   dexamethasone (DECADRON) 4 MG tablet Take 5 tabs (20 mg) weekly the day after daratumumab for 12 weeks. Take with breakfast.     lenalidomide (REVLIMID) 10 MG capsule Take 1 capsule (10 mg total) by mouth daily. Take for 14 days, then hold for 7 days. 14 capsule 0   lidocaine-prilocaine (EMLA) cream Apply 1 application topically as needed. 30 g 2   liraglutide (VICTOZA) 18 MG/3ML SOPN Inject 1.2 mg into the skin daily.     meloxicam (MOBIC) 15 MG tablet Take 15 mg by mouth daily.     metFORMIN (GLUCOPHAGE) 500 MG tablet Take 1,000 mg by mouth 2 (two) times daily with a meal.     methocarbamol (ROBAXIN) 500 MG tablet Take 1 tablet (500 mg total) by mouth every 6 (six) hours as needed for muscle spasms. 40 tablet 1   metoprolol succinate (TOPROL-XL) 50 MG 24 hr tablet Take 50 mg by mouth 2 (two) times daily. Take with or immediately following a meal.     Misc Natural Products (GLUCOSAMINE CHOND CMP TRIPLE) TABS Take 1 tablet by mouth daily.     morphine (MS CONTIN) 15 MG 12 hr tablet Take 2 tablets (30 mg total) by mouth every 12 (twelve) hours. 120 tablet 0   Multiple Vitamin (MULTIVITAMIN WITH MINERALS) TABS tablet Take 1 tablet by mouth daily.     omeprazole (PRILOSEC) 20 MG capsule Take 20 mg by mouth daily.     ondansetron (ZOFRAN) 8 MG tablet Take 1 tablet (8 mg total) by mouth 2 (two) times daily as needed (Nausea or vomiting). 60 tablet 2   oxyCODONE-acetaminophen (PERCOCET/ROXICET) 5-325 MG tablet TAKE 1-2 TABLETS BY MOUTH EVERY 4 HOURS AS NEEDED FOR SEVERE PAIN 90 tablet 0   polyethylene glycol (MIRALAX / GLYCOLAX) 17 g packet Take by mouth daily as needed for mild constipation.     prochlorperazine (COMPAZINE) 10 MG tablet Take 1 tablet (10 mg total) by mouth every 6 (six) hours as needed (Nausea or vomiting). 60 tablet 2    traMADol (ULTRAM) 50 MG tablet TAKE 1 TABLET BY MOUTH TWICE DAILY AS NEEDED FOR MODERATE PAIN 60 tablet 1   No current facility-administered medications for this visit.   Facility-Administered Medications Ordered in Other Visits  Medication Dose Route Frequency Provider Last Rate Last Admin   sodium chloride flush (NS) 0.9 % injection 10 mL  10 mL Intravenous PRN Lloyd Huger, MD   10 mL at 11/23/20 0932    OBJECTIVE: Vitals:   11/30/20 0900  BP: (!) 121/55  Pulse: 94  Resp: 16  Temp: 97.9 F (36.6 C)  SpO2: 100%  Body mass index is 25.66 kg/m.    ECOG FS:1 - Symptomatic but completely ambulatory  General: Well-developed, well-nourished, no acute distress. Eyes: Pink conjunctiva, anicteric sclera. HEENT: Normocephalic, moist mucous membranes. Lungs: No audible wheezing or coughing. Heart: Regular rate and rhythm. Abdomen: Soft, nontender, no obvious distention. Musculoskeletal: No edema, cyanosis, or clubbing. Neuro: Alert, answering all questions appropriately. Cranial nerves grossly intact. Skin: No rashes or petechiae noted. Psych: Normal affect.  LAB RESULTS:  Lab Results  Component Value Date   NA 135 11/30/2020   K 4.1 11/30/2020   CL 104 11/30/2020   CO2 23 11/30/2020   GLUCOSE 173 (H) 11/30/2020   BUN 14 11/30/2020   CREATININE 1.01 (H) 11/30/2020   CALCIUM 8.2 (L) 11/30/2020   PROT 6.5 11/30/2020   ALBUMIN 3.6 11/30/2020   AST 28 11/30/2020   ALT 19 11/30/2020   ALKPHOS 296 (H) 11/30/2020   BILITOT 0.3 11/30/2020   GFRNONAA >60 11/30/2020    Lab Results  Component Value Date   WBC 3.2 (L) 11/30/2020   NEUTROABS 2.4 11/30/2020   HGB 9.2 (L) 11/30/2020   HCT 28.4 (L) 11/30/2020   MCV 99.6 11/30/2020   PLT 156 11/30/2020     STUDIES: No results found.   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  PLAN:      1.  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  She also  has elevated lambda free light chains of 432.1.  Immunoglobulins and SPEP are normal.  Bone marrow biopsy completed on August 26, 2020 revealed 25% plasma cells along with the deletion of 1p which is considered poor prognosis.  PET scan results from September 06, 2020 reviewed independently with 3 hypermetabolic lesions in right C5-6 facets, right iliac crest lesion, and L3 vertebral lesion.  After lengthy discussion with the patient, she agreed to pursue chemotherapy with daratumumab, Velcade, Revlimid, and dexamethasone followed by autologous bone marrow transplant.  Patient will receive weekly daratumumab for the first 3-4 cycles along with Velcade on days 1, 4, 8, and 11.  She will receive Revlimid on days 1 through 14 of a 21-day cycle.  Plan to do 4-6 cycles prior to autologous bone cell transplant at [facility].  Her initial appointment at [facility] is on November 15, 2020.  Patient missed day 15 of cycle 1 secondary to thrombocytopenia.  May have to consider dose reduction or modification in the near future.  Proceed with cycle 2, day 8 of treatment today.  Return to clinic on Friday for Velcade only, in 1 week for lab and daratumumab and then in 2 weeks for further evaluation and consideration of cycle 3, day 1. 2.  Elevated liver enzymes: Resolved.   3.  Pain: Improved.  Patient has now completed XRT.  She had kyphoplasty x2.  Better controlled on 30 mg MS Contin every 12 hours.  Patient continues to take Percocet as needed sparingly.  4.  Hypercalcemia: Resolved.  Patient's calcium is 8.2 today.  Proceed with Zometa.   5.  Renal insufficiency: Resolved. 6.  Neutropenia: Improved. 7.  Anemia: Chronic and unchanged.  Patient's hemoglobin is 9.2 today. 8.  Thrombocytopenia: Resolved.  Monitor. 9.  Low-grade fevers: Resolved.  Patient expressed understanding and was in agreement with this plan. She also understands that She can call clinic at any time with any questions, concerns, or complaints.    Cancer Staging Lambda light chain myeloma ([place]) Staging form:  Plasma Cell Myeloma and Plasma Cell Disorders, AJCC 8th Edition - Clinical stage from 09/16/2020: RISS Stage I (Beta-2-microglobulin (mg/L): 2.5, Albumin (g/dL): 4.9, ISS: Stage I, High-risk cytogenetics: Absent, LDH: Normal) - Signed by Lloyd Huger, MD on 09/16/2020 Beta 2 microglobulin range (mg/L): Less than 3.5 Albumin range (g/dL): Greater than or equal to 3.5 Cytogenetics: 1p deletion  Lloyd Huger, MD   11/30/2020 6:53 PM

## 2020-11-30 ENCOUNTER — Other Ambulatory Visit: Payer: Self-pay

## 2020-11-30 ENCOUNTER — Inpatient Hospital Stay: Payer: BC Managed Care – PPO

## 2020-11-30 ENCOUNTER — Inpatient Hospital Stay: Payer: BC Managed Care – PPO | Attending: Oncology

## 2020-11-30 ENCOUNTER — Inpatient Hospital Stay (HOSPITAL_BASED_OUTPATIENT_CLINIC_OR_DEPARTMENT_OTHER): Payer: BC Managed Care – PPO | Admitting: Oncology

## 2020-11-30 VITALS — BP 118/65 | HR 78 | Resp 16

## 2020-11-30 VITALS — BP 121/55 | HR 94 | Temp 97.9°F | Resp 16 | Wt 140.3 lb

## 2020-11-30 DIAGNOSIS — C9000 Multiple myeloma not having achieved remission: Secondary | ICD-10-CM | POA: Insufficient documentation

## 2020-11-30 DIAGNOSIS — Z5112 Encounter for antineoplastic immunotherapy: Secondary | ICD-10-CM | POA: Diagnosis not present

## 2020-11-30 DIAGNOSIS — D649 Anemia, unspecified: Secondary | ICD-10-CM | POA: Insufficient documentation

## 2020-11-30 DIAGNOSIS — D72819 Decreased white blood cell count, unspecified: Secondary | ICD-10-CM | POA: Insufficient documentation

## 2020-11-30 DIAGNOSIS — Z79899 Other long term (current) drug therapy: Secondary | ICD-10-CM | POA: Insufficient documentation

## 2020-11-30 DIAGNOSIS — D696 Thrombocytopenia, unspecified: Secondary | ICD-10-CM | POA: Insufficient documentation

## 2020-11-30 LAB — CBC WITH DIFFERENTIAL/PLATELET
Abs Immature Granulocytes: 0.02 10*3/uL (ref 0.00–0.07)
Basophils Absolute: 0 10*3/uL (ref 0.0–0.1)
Basophils Relative: 0 %
Eosinophils Absolute: 0.2 10*3/uL (ref 0.0–0.5)
Eosinophils Relative: 6 %
HCT: 28.4 % — ABNORMAL LOW (ref 36.0–46.0)
Hemoglobin: 9.2 g/dL — ABNORMAL LOW (ref 12.0–15.0)
Immature Granulocytes: 1 %
Lymphocytes Relative: 11 %
Lymphs Abs: 0.3 10*3/uL — ABNORMAL LOW (ref 0.7–4.0)
MCH: 32.3 pg (ref 26.0–34.0)
MCHC: 32.4 g/dL (ref 30.0–36.0)
MCV: 99.6 fL (ref 80.0–100.0)
Monocytes Absolute: 0.2 10*3/uL (ref 0.1–1.0)
Monocytes Relative: 8 %
Neutro Abs: 2.4 10*3/uL (ref 1.7–7.7)
Neutrophils Relative %: 74 %
Platelets: 156 10*3/uL (ref 150–400)
RBC: 2.85 MIL/uL — ABNORMAL LOW (ref 3.87–5.11)
RDW: 14.1 % (ref 11.5–15.5)
WBC: 3.2 10*3/uL — ABNORMAL LOW (ref 4.0–10.5)
nRBC: 0 % (ref 0.0–0.2)

## 2020-11-30 LAB — COMPREHENSIVE METABOLIC PANEL
ALT: 19 U/L (ref 0–44)
AST: 28 U/L (ref 15–41)
Albumin: 3.6 g/dL (ref 3.5–5.0)
Alkaline Phosphatase: 296 U/L — ABNORMAL HIGH (ref 38–126)
Anion gap: 8 (ref 5–15)
BUN: 14 mg/dL (ref 8–23)
CO2: 23 mmol/L (ref 22–32)
Calcium: 8.2 mg/dL — ABNORMAL LOW (ref 8.9–10.3)
Chloride: 104 mmol/L (ref 98–111)
Creatinine, Ser: 1.01 mg/dL — ABNORMAL HIGH (ref 0.44–1.00)
GFR, Estimated: >60 mL/min (ref >60)
Glucose, Bld: 173 mg/dL — ABNORMAL HIGH (ref 70–99)
Potassium: 4.1 mmol/L (ref 3.5–5.1)
Sodium: 135 mmol/L (ref 135–145)
Total Bilirubin: 0.3 mg/dL (ref 0.3–1.2)
Total Protein: 6.5 g/dL (ref 6.5–8.1)

## 2020-11-30 MED ORDER — HEPARIN SOD (PORK) LOCK FLUSH 100 UNIT/ML IV SOLN
500.0000 [IU] | Freq: Once | INTRAVENOUS | Status: AC
  Filled 2020-11-30: qty 5

## 2020-11-30 MED ORDER — DIPHENHYDRAMINE HCL 25 MG PO CAPS
25.0000 mg | ORAL_CAPSULE | Freq: Once | ORAL | Status: AC
  Administered 2020-11-30: 25 mg via ORAL
  Filled 2020-11-30: qty 1

## 2020-11-30 MED ORDER — DEXAMETHASONE 4 MG PO TABS
20.0000 mg | ORAL_TABLET | Freq: Once | ORAL | Status: AC
  Administered 2020-11-30: 20 mg via ORAL
  Filled 2020-11-30: qty 5

## 2020-11-30 MED ORDER — HEPARIN SOD (PORK) LOCK FLUSH 100 UNIT/ML IV SOLN
INTRAVENOUS | Status: AC
  Administered 2020-11-30: 500 [IU] via INTRAVENOUS
  Filled 2020-11-30: qty 5

## 2020-11-30 MED ORDER — ZOLEDRONIC ACID 4 MG/5ML IV CONC
3.0000 mg | Freq: Once | INTRAVENOUS | Status: AC
  Administered 2020-11-30: 3 mg via INTRAVENOUS
  Filled 2020-11-30: qty 3.75

## 2020-11-30 MED ORDER — BORTEZOMIB CHEMO SQ INJECTION 3.5 MG (2.5MG/ML)
1.3000 mg/m2 | Freq: Once | INTRAMUSCULAR | Status: AC
  Administered 2020-11-30: 2.25 mg via SUBCUTANEOUS
  Filled 2020-11-30: qty 0.9

## 2020-11-30 MED ORDER — SODIUM CHLORIDE 0.9 % IV SOLN
Freq: Once | INTRAVENOUS | Status: AC
  Filled 2020-11-30: qty 250

## 2020-11-30 MED ORDER — ACETAMINOPHEN 325 MG PO TABS
650.0000 mg | ORAL_TABLET | Freq: Once | ORAL | Status: AC
  Administered 2020-11-30: 650 mg via ORAL
  Filled 2020-11-30: qty 2

## 2020-11-30 MED ORDER — DARATUMUMAB-HYALURONIDASE-FIHJ 1800-30000 MG-UT/15ML ~~LOC~~ SOLN
1800.0000 mg | Freq: Once | SUBCUTANEOUS | Status: AC
  Administered 2020-11-30: 1800 mg via SUBCUTANEOUS
  Filled 2020-11-30: qty 15

## 2020-11-30 NOTE — Progress Notes (Signed)
Patient monitored for 15 minutes after Daratumumab injection, as ordered by Dr. Grayland Ormond. Patient tolerated treatment well. Vital signs stable at discharge.

## 2020-11-30 NOTE — Progress Notes (Signed)
Pt has no questions or concerns at this time. Pt does endorse some nausea that is relieved by medication. Pt states her rib pain is improving.

## 2020-12-02 ENCOUNTER — Encounter: Payer: Self-pay | Admitting: Oncology

## 2020-12-03 ENCOUNTER — Inpatient Hospital Stay: Payer: BC Managed Care – PPO

## 2020-12-03 VITALS — BP 119/57 | HR 87 | Temp 98.8°F | Resp 17

## 2020-12-03 DIAGNOSIS — Z5112 Encounter for antineoplastic immunotherapy: Secondary | ICD-10-CM | POA: Diagnosis not present

## 2020-12-03 DIAGNOSIS — C9000 Multiple myeloma not having achieved remission: Secondary | ICD-10-CM

## 2020-12-03 MED ORDER — PROCHLORPERAZINE MALEATE 10 MG PO TABS
10.0000 mg | ORAL_TABLET | Freq: Once | ORAL | Status: AC
  Administered 2020-12-03: 10 mg via ORAL
  Filled 2020-12-03: qty 1

## 2020-12-03 MED ORDER — BORTEZOMIB CHEMO SQ INJECTION 3.5 MG (2.5MG/ML)
1.3000 mg/m2 | Freq: Once | INTRAMUSCULAR | Status: AC
  Administered 2020-12-03: 2.25 mg via SUBCUTANEOUS
  Filled 2020-12-03: qty 0.9

## 2020-12-03 NOTE — Patient Instructions (Signed)
[facility] [place] [facility]  Discharge Instructions: Thank you for choosing [facility] to provide your oncology and hematology care.  If you have a lab appointment with the Cancer Center, please go directly to the Cancer Center and check in at the registration area.  Wear comfortable clothing and clothing appropriate for easy access to any Portacath or PICC line.   We strive to give you quality time with your provider. You may need to reschedule your appointment if you arrive late (15 or more minutes).  Arriving late affects you and other patients whose appointments are after yours.  Also, if you miss three or more appointments without notifying the office, you may be dismissed from the clinic at the provider's discretion.      For prescription refill requests, have your pharmacy contact our office and allow 72 hours for refills to be completed.    Today you received the following chemotherapy and/or immunotherapy agents Velcade      To help prevent nausea and vomiting after your treatment, we encourage you to take your nausea medication as directed.  BELOW ARE SYMPTOMS THAT SHOULD BE REPORTED IMMEDIATELY: *FEVER GREATER THAN 100.4 F (38 C) OR HIGHER *CHILLS OR SWEATING *NAUSEA AND VOMITING THAT IS NOT CONTROLLED WITH YOUR NAUSEA MEDICATION *UNUSUAL SHORTNESS OF BREATH *UNUSUAL BRUISING OR BLEEDING *URINARY PROBLEMS (pain or burning when urinating, or frequent urination) *BOWEL PROBLEMS (unusual diarrhea, constipation, pain near the anus) TENDERNESS IN MOUTH AND THROAT WITH OR WITHOUT PRESENCE OF ULCERS (sore throat, sores in mouth, or a toothache) UNUSUAL RASH, SWELLING OR PAIN  UNUSUAL VAGINAL DISCHARGE OR ITCHING   Items with * indicate a potential emergency and should be followed up as soon as possible or go to the Emergency Department if any problems should occur.  Please show the CHEMOTHERAPY ALERT CARD or IMMUNOTHERAPY ALERT CARD at check-in to  the Emergency Department and triage nurse.  Should you have questions after your visit or need to cancel or reschedule your appointment, please contact [facility] [place] [facility]  [phone] and follow the prompts.  Office hours are 8:00 a.m. to 4:30 p.m. Monday - Friday. Please note that voicemails left after 4:00 p.m. may not be returned until the following business day.  We are closed weekends and major holidays. You have access to a nurse at all times for urgent questions. Please call the main number to the clinic [phone] and follow the prompts.  For any non-urgent questions, you may also contact your provider using MyChart. We now offer e-Visits for anyone 18 and older to request care online for non-urgent symptoms. For details visit [email or web address].   Also download the MyChart app! Go to the app store, search "MyChart", open the app, select [facility], and log in with your MyChart username and password.  Due to Covid, a mask is required upon entering the hospital/clinic. If you do not have a mask, one will be given to you upon arrival. For doctor visits, patients may have 1 support person aged 18 or older with them. For treatment visits, patients cannot have anyone with them due to current Covid guidelines and our immunocompromised population.  

## 2020-12-03 NOTE — Progress Notes (Signed)
1445: pt reports since receiving treatment on 11/30/20 she has been experiencing dizziness when going from a sitting to standing position. She states this is new for her. Pt also states that she experienced SOB while walking up the stairs earlier today and that has never happened before.  Pt reports that dizziness is improving in comparison to Tuesday evening.   Dr. Grayland Ormond and Beckey Rutter NP aware. Per Beckey Rutter okay to proceed with Velcade at this time, pt to increase fluid intake, and consult with PCP regarding Metoprolol. Pt offered to be scheduled for [facility] next week, Per Pt, she is seeing her PCP in the "next few weeks" and that she will increase fluid intake and if symptoms persist or worsen she will call clinic to see [facility].   7096: Pt tolerated treatment well. No s/s of distress noted. Pt stable at discharge.

## 2020-12-06 NOTE — Progress Notes (Signed)
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  INTERVAL HISTORY: Patient returns to clinic today for further evaluation and consideration of cycle 2, day 15 of DVRd.  Daratumumab only.  Her rib and back pain are still evident.  She continues to have chronic weakness and fatigue and occasional nausea.  She has no further falls or tremors.  She does not report any further confusion.  She has no neurologic complaints.  She denies any recent fevers.  She has no chest pain, shortness of breath, cough, or hemoptysis.  She denies any vomiting, constipation, or diarrhea.  She has no urinary complaints.  Patient offers no further specific complaints today.  REVIEW OF SYSTEMS:   Review of Systems  Constitutional:  Positive for malaise/fatigue. Negative for fever and weight loss.  Respiratory: Negative.  Negative for cough, hemoptysis and shortness of breath.   Cardiovascular: Negative.  Negative for chest pain and leg swelling.  Gastrointestinal:  Positive for nausea. Negative for abdominal pain.  Genitourinary: Negative.  Negative for dysuria.  Musculoskeletal:  Positive for back pain. Negative for falls and joint pain.  Skin: Negative.  Negative for rash.  Neurological:  Positive for weakness. Negative for dizziness, tremors, focal weakness and headaches.  Psychiatric/Behavioral: Negative.  The patient is not nervous/anxious.    As per HPI. Otherwise, a complete review of systems is negative.  PAST MEDICAL HISTORY: Past Medical History:  Diagnosis Date   Anxiety    Diabetes mellitus without complication ([place]) 9629   GERD (gastroesophageal reflux disease)    Hyperlipidemia    Personal history of  colonic polyps    Squamous cell carcinoma of skin 11/25/2013   Left chest. WD SCC with superficial infiltration.   Tachycardia     PAST SURGICAL HISTORY: Past Surgical History:  Procedure Laterality Date   AUGMENTATION MAMMAPLASTY Bilateral 5284   silicone and replacement in 2001   [address], [place], 1998,2003, 2008, 2013   COLONOSCOPY WITH PROPOFOL N/A 08/16/2016   Procedure: COLONOSCOPY WITH PROPOFOL;  Surgeon: Robert Bellow, MD;  Location: [facility] ENDOSCOPY;  Service: Endoscopy;  Laterality: N/A;   DILATION AND CURETTAGE OF UTERUS     ENDOMETRIAL ABLATION  12/1991   ganglion cyst removal      IR IMAGING GUIDED PORT INSERTION  10/27/2020   KYPHOPLASTY N/A 10/28/2020   Procedure: T12 and L3 KYPHOPLASTY;  Surgeon: Hessie Knows, MD;  Location: [facility] ORS;  Service: Orthopedics;  Laterality: N/A;   TONSILLECTOMY     WRIST SURGERY Right 05/1995    FAMILY HISTORY: Family History  Problem Relation Age of Onset   Colon polyps Sister    Colon cancer Father 35   Diabetes Mother    Breast cancer Neg Hx     ADVANCED DIRECTIVES (Y/N):  N  HEALTH MAINTENANCE: Social History   Tobacco Use   Smoking status: Former    Packs/day: 1.00    Years: 4.00    Pack years: 4.00    Types: Cigarettes    Quit date: 1982    Years since quitting: 40.8   Smokeless tobacco: Never  Substance Use Topics   Alcohol use: Yes    Alcohol/week: 1.0 -  2.0 standard drink    Types: 1 - 2 Glasses of wine per week    Comment: occassionally   Drug use: No     Colonoscopy:  PAP:  Bone density:  Lipid panel:  No Known Allergies  Current Outpatient Medications  Medication Sig Dispense Refill   acyclovir (ZOVIRAX) 400 MG tablet Take 1 tablet (400 mg total) by mouth 2 (two) times daily. 60 tablet 5   ASPIRIN 81 PO Take 81 mg by mouth daily.     atorvastatin (LIPITOR) 20 MG tablet Take 20 mg by mouth daily.     busPIRone (BUSPAR) 30 MG  tablet Take 30 mg by mouth 2 (two) times daily.     CALCIUM-VITAMIN D PO Take 1 tablet by mouth daily.     cyclobenzaprine (FLEXERIL) 10 MG tablet Take 1 tablet (10 mg total) by mouth at bedtime. 30 tablet 2   dexamethasone (DECADRON) 4 MG tablet Take 5 tabs (20 mg) weekly the day after daratumumab for 12 weeks. Take with breakfast.     lenalidomide (REVLIMID) 10 MG capsule Take 1 capsule (10 mg total) by mouth daily. Take for 14 days, then hold for 7 days. 14 capsule 0   lidocaine-prilocaine (EMLA) cream Apply 1 application topically as needed. 30 g 2   liraglutide (VICTOZA) 18 MG/3ML SOPN Inject 1.2 mg into the skin daily.     meloxicam (MOBIC) 15 MG tablet Take 15 mg by mouth daily.     metFORMIN (GLUCOPHAGE) 500 MG tablet Take 1,000 mg by mouth 2 (two) times daily with a meal.     methocarbamol (ROBAXIN) 500 MG tablet Take 1 tablet (500 mg total) by mouth every 6 (six) hours as needed for muscle spasms. 40 tablet 1   metoprolol succinate (TOPROL-XL) 50 MG 24 hr tablet Take 50 mg by mouth 2 (two) times daily. Take with or immediately following a meal.     Misc Natural Products (GLUCOSAMINE CHOND CMP TRIPLE) TABS Take 1 tablet by mouth daily.     morphine (MS CONTIN) 15 MG 12 hr tablet Take 2 tablets (30 mg total) by mouth every 12 (twelve) hours. 120 tablet 0   Multiple Vitamin (MULTIVITAMIN WITH MINERALS) TABS tablet Take 1 tablet by mouth daily.     omeprazole (PRILOSEC) 20 MG capsule Take 20 mg by mouth daily.     ondansetron (ZOFRAN) 8 MG tablet Take 1 tablet (8 mg total) by mouth 2 (two) times daily as needed (Nausea or vomiting). 60 tablet 2   oxyCODONE-acetaminophen (PERCOCET/ROXICET) 5-325 MG tablet TAKE 1-2 TABLETS BY MOUTH EVERY 4 HOURS AS NEEDED FOR SEVERE PAIN 90 tablet 0   polyethylene glycol (MIRALAX / GLYCOLAX) 17 g packet Take by mouth daily as needed for mild constipation.     prochlorperazine (COMPAZINE) 10 MG tablet Take 1 tablet (10 mg total) by mouth every 6 (six) hours as  needed (Nausea or vomiting). 60 tablet 2   traMADol (ULTRAM) 50 MG tablet TAKE 1 TABLET BY MOUTH TWICE DAILY AS NEEDED FOR MODERATE PAIN 60 tablet 1   No current facility-administered medications for this visit.   Facility-Administered Medications Ordered in Other Visits  Medication Dose Route Frequency Provider Last Rate Last Admin   [COMPLETED] heparin lock flush 100 unit/mL  500 Units Intravenous Once Lloyd Huger, MD   500 Units at 12/07/20 1540   heparin lock flush 100 unit/mL  500 Units Intracatheter Once PRN Lloyd Huger, MD       sodium chloride flush (  NS) 0.9 % injection 10 mL  10 mL Intravenous PRN Lloyd Huger, MD   10 mL at 11/23/20 0932    OBJECTIVE: Vitals:   12/07/20 1343  BP: 113/60  Pulse: 94  Resp: 18  Temp: (!) 96.4 F (35.8 C)  SpO2: 98%     Body mass index is 25.57 kg/m.    ECOG FS:1 - Symptomatic but completely ambulatory  General: Well-developed, well-nourished, no acute distress. Eyes: Pink conjunctiva, anicteric sclera. HEENT: Normocephalic, moist mucous membranes. Lungs: No audible wheezing or coughing. Heart: Regular rate and rhythm. Abdomen: Soft, nontender, no obvious distention. Musculoskeletal: No edema, cyanosis, or clubbing. Neuro: Alert, answering all questions appropriately. Cranial nerves grossly intact. Skin: No rashes or petechiae noted. Psych: Normal affect.   LAB RESULTS:  Lab Results  Component Value Date   NA 138 12/07/2020   K 4.5 12/07/2020   CL 105 12/07/2020   CO2 22 12/07/2020   GLUCOSE 137 (H) 12/07/2020   BUN 16 12/07/2020   CREATININE 1.18 (H) 12/07/2020   CALCIUM 8.5 (L) 12/07/2020   PROT 6.6 12/07/2020   ALBUMIN 3.7 12/07/2020   AST 23 12/07/2020   ALT 17 12/07/2020   ALKPHOS 250 (H) 12/07/2020   BILITOT 0.3 12/07/2020   GFRNONAA 52 (L) 12/07/2020    Lab Results  Component Value Date   WBC 4.6 12/07/2020   NEUTROABS 3.6 12/07/2020   HGB 9.1 (L) 12/07/2020   HCT 27.6 (L) 12/07/2020    MCV 98.6 12/07/2020   PLT 54 (L) 12/07/2020     STUDIES: No results found.   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  PLAN:      1.  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  She also has elevated lambda free light chains of 432.1.  Immunoglobulins and SPEP are normal.  Bone marrow biopsy completed on August 26, 2020 revealed 25% plasma cells along with the deletion of 1p which is considered poor prognosis.  PET scan results from September 06, 2020 reviewed independently with 3 hypermetabolic lesions in right C5-6 facets, right iliac crest lesion, and L3 vertebral lesion.  After lengthy discussion with the patient, she agreed to pursue chemotherapy with daratumumab, Velcade, Revlimid, and dexamethasone followed by autologous bone marrow transplant.  Patient will receive weekly daratumumab for the first 3-4 cycles along with Velcade on days 1, 4, 8, and 11.  She will receive Revlimid on days 1 through 14 of a 21-day cycle.  Plan to do 4-6 cycles prior to autologous bone cell transplant at [facility].  Her initial appointment at [facility] is on November 15, 2020.  Given patient's persistent thrombocytopenia, will discontinue day 15 which is daratumumab only for today and all subsequent cycles.  Patient instead will receive IV fluids.  Return to clinic in 1 week as previously scheduled for further evaluation and consideration of cycle 3, day 1. 2.  Elevated liver enzymes: Resolved.   3.  Pain: Improved.  Patient has now completed XRT.  She had kyphoplasty x2.  Better controlled on 30 mg MS Contin every 12 hours.  Patient continues to take Percocet as needed sparingly.  4.  Hypercalcemia: Resolved.  Patient's calcium is 8.5 today.  She last received Zometa on November 30, 2020. 5.  Renal insufficiency: Mild, monitor.  Patient's creatinine is 1.18 today. 6.  Neutropenia: Improved. 7.  Anemia: Chronic and unchanged.  Patient's hemoglobin is 9.1  today.. 8.  Thrombocytopenia:  Secondary to chemotherapy.  No treatment today and will discontinue day 15 daratumumab with all subsequent cycles. 9.  Dehydration/nausea: Patient will receive 1 L of IV fluids along with IV ondansetron and Decadron.  Patient expressed understanding and was in agreement with this plan. She also understands that She can call clinic at any time with any questions, concerns, or complaints.   Cancer Staging Lambda light chain myeloma ([place]) Staging form: Plasma Cell Myeloma and Plasma Cell Disorders, AJCC 8th Edition - Clinical stage from 09/16/2020: RISS Stage I (Beta-2-microglobulin (mg/L): 2.5, Albumin (g/dL): 4.9, ISS: Stage I, High-risk cytogenetics: Absent, LDH: Normal) - Signed by Lloyd Huger, MD on 09/16/2020 Beta 2 microglobulin range (mg/L): Less than 3.5 Albumin range (g/dL): Greater than or equal to 3.5 Cytogenetics: 1p deletion  Lloyd Huger, MD   12/07/2020 3:06 PM

## 2020-12-07 ENCOUNTER — Inpatient Hospital Stay: Payer: BC Managed Care – PPO

## 2020-12-07 ENCOUNTER — Other Ambulatory Visit: Payer: Self-pay

## 2020-12-07 ENCOUNTER — Other Ambulatory Visit: Payer: Self-pay | Admitting: *Deleted

## 2020-12-07 ENCOUNTER — Inpatient Hospital Stay (HOSPITAL_BASED_OUTPATIENT_CLINIC_OR_DEPARTMENT_OTHER): Payer: BC Managed Care – PPO | Admitting: Oncology

## 2020-12-07 VITALS — BP 113/60 | HR 94 | Temp 96.4°F | Resp 18 | Wt 139.8 lb

## 2020-12-07 DIAGNOSIS — C9000 Multiple myeloma not having achieved remission: Secondary | ICD-10-CM

## 2020-12-07 DIAGNOSIS — Z5112 Encounter for antineoplastic immunotherapy: Secondary | ICD-10-CM | POA: Diagnosis not present

## 2020-12-07 DIAGNOSIS — Z95828 Presence of other vascular implants and grafts: Secondary | ICD-10-CM

## 2020-12-07 LAB — COMPREHENSIVE METABOLIC PANEL
ALT: 17 U/L (ref 0–44)
AST: 23 U/L (ref 15–41)
Albumin: 3.7 g/dL (ref 3.5–5.0)
Alkaline Phosphatase: 250 U/L — ABNORMAL HIGH (ref 38–126)
Anion gap: 11 (ref 5–15)
BUN: 16 mg/dL (ref 8–23)
CO2: 22 mmol/L (ref 22–32)
Calcium: 8.5 mg/dL — ABNORMAL LOW (ref 8.9–10.3)
Chloride: 105 mmol/L (ref 98–111)
Creatinine, Ser: 1.18 mg/dL — ABNORMAL HIGH (ref 0.44–1.00)
GFR, Estimated: 52 mL/min — ABNORMAL LOW (ref >60)
Glucose, Bld: 137 mg/dL — ABNORMAL HIGH (ref 70–99)
Potassium: 4.5 mmol/L (ref 3.5–5.1)
Sodium: 138 mmol/L (ref 135–145)
Total Bilirubin: 0.3 mg/dL (ref 0.3–1.2)
Total Protein: 6.6 g/dL (ref 6.5–8.1)

## 2020-12-07 LAB — CBC WITH DIFFERENTIAL/PLATELET
Abs Immature Granulocytes: 0.01 10*3/uL (ref 0.00–0.07)
Basophils Absolute: 0 10*3/uL (ref 0.0–0.1)
Basophils Relative: 0 %
Eosinophils Absolute: 0.2 10*3/uL (ref 0.0–0.5)
Eosinophils Relative: 4 %
HCT: 27.6 % — ABNORMAL LOW (ref 36.0–46.0)
Hemoglobin: 9.1 g/dL — ABNORMAL LOW (ref 12.0–15.0)
Immature Granulocytes: 0 %
Lymphocytes Relative: 6 %
Lymphs Abs: 0.3 10*3/uL — ABNORMAL LOW (ref 0.7–4.0)
MCH: 32.5 pg (ref 26.0–34.0)
MCHC: 33 g/dL (ref 30.0–36.0)
MCV: 98.6 fL (ref 80.0–100.0)
Monocytes Absolute: 0.5 10*3/uL (ref 0.1–1.0)
Monocytes Relative: 10 %
Neutro Abs: 3.6 10*3/uL (ref 1.7–7.7)
Neutrophils Relative %: 80 %
Platelets: 54 10*3/uL — ABNORMAL LOW (ref 150–400)
RBC: 2.8 MIL/uL — ABNORMAL LOW (ref 3.87–5.11)
RDW: 14 % (ref 11.5–15.5)
WBC: 4.6 10*3/uL (ref 4.0–10.5)
nRBC: 0 % (ref 0.0–0.2)

## 2020-12-07 MED ORDER — HEPARIN SOD (PORK) LOCK FLUSH 100 UNIT/ML IV SOLN
500.0000 [IU] | Freq: Once | INTRAVENOUS | Status: DC | PRN
  Filled 2020-12-07: qty 5

## 2020-12-07 MED ORDER — DEXAMETHASONE SODIUM PHOSPHATE 10 MG/ML IJ SOLN
10.0000 mg | Freq: Once | INTRAMUSCULAR | Status: AC
  Administered 2020-12-07: 10 mg via INTRAVENOUS
  Filled 2020-12-07: qty 1

## 2020-12-07 MED ORDER — ONDANSETRON HCL 4 MG/2ML IJ SOLN
8.0000 mg | Freq: Once | INTRAMUSCULAR | Status: AC
  Administered 2020-12-07: 8 mg via INTRAVENOUS
  Filled 2020-12-07: qty 4

## 2020-12-07 MED ORDER — SODIUM CHLORIDE 0.9 % IV SOLN: Freq: Once | INTRAVENOUS | Status: DC

## 2020-12-07 MED ORDER — SODIUM CHLORIDE 0.9 % IV SOLN
Freq: Once | INTRAVENOUS | Status: AC
  Filled 2020-12-07: qty 250

## 2020-12-07 MED ORDER — LENALIDOMIDE 10 MG PO CAPS: 10.0000 mg | ORAL_CAPSULE | Freq: Every day | ORAL | 0 refills | Status: DC

## 2020-12-07 MED ORDER — HEPARIN SOD (PORK) LOCK FLUSH 100 UNIT/ML IV SOLN
500.0000 [IU] | Freq: Once | INTRAVENOUS | Status: AC
  Administered 2020-12-07: 500 [IU] via INTRAVENOUS
  Filled 2020-12-07: qty 5

## 2020-12-07 MED FILL — REVLIMID 10MG CAP: 21 days supply | Qty: 14

## 2020-12-07 NOTE — Progress Notes (Signed)
Pt c/o dizziness, weakness and fatigue since last visit. Pt still reporting 4/10 rib pain that she feels isnt getting better.

## 2020-12-08 ENCOUNTER — Encounter: Payer: Self-pay | Admitting: Oncology

## 2020-12-12 ENCOUNTER — Encounter: Payer: Self-pay | Admitting: Oncology

## 2020-12-13 NOTE — Progress Notes (Signed)
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  INTERVAL HISTORY: Patient returns to clinic today for further evaluation and consideration of cycle 3, day 1 of DVRd.  She has some mild peripheral edema and dyspnea on exertion, but otherwise feels well.  Her flank/rib pain has improved.  She continues to have chronic weakness and fatigue.  She has no neurologic complaints.  She denies any recent fevers.  She has no chest pain, shortness of breath, cough, or hemoptysis.  She denies any vomiting, constipation, or diarrhea.  She has no urinary complaints.  Patient offers no further specific complaints today.  REVIEW OF SYSTEMS:   Review of Systems  Constitutional:  Positive for malaise/fatigue. Negative for fever and weight loss.  Respiratory: Negative.  Negative for cough, hemoptysis and shortness of breath.   Cardiovascular:  Positive for leg swelling. Negative for chest pain.  Gastrointestinal: Negative.  Negative for abdominal pain and nausea.  Genitourinary: Negative.  Negative for dysuria.  Musculoskeletal:  Positive for back pain. Negative for falls and joint pain.  Skin: Negative.  Negative for rash.  Neurological:  Positive for weakness. Negative for dizziness, tremors, focal weakness and headaches.  Psychiatric/Behavioral: Negative.  The patient is not nervous/anxious.    As per HPI. Otherwise, a complete review of systems is negative.  PAST MEDICAL HISTORY: Past Medical History:  Diagnosis Date   Anxiety    Diabetes mellitus without complication ([place]) 6433   GERD (gastroesophageal reflux disease)    Hyperlipidemia    Personal history of colonic polyps    Squamous cell carcinoma of skin  11/25/2013   Left chest. WD SCC with superficial infiltration.   Tachycardia     PAST SURGICAL HISTORY: Past Surgical History:  Procedure Laterality Date   AUGMENTATION MAMMAPLASTY Bilateral 2951   silicone and replacement in 2001   [place], [place], 1998,2003, 2008, 2013   COLONOSCOPY WITH PROPOFOL N/A 08/16/2016   Procedure: COLONOSCOPY WITH PROPOFOL;  Surgeon: Robert Bellow, MD;  Location: [facility] ENDOSCOPY;  Service: Endoscopy;  Laterality: N/A;   DILATION AND CURETTAGE OF UTERUS     ENDOMETRIAL ABLATION  12/1991   ganglion cyst removal      IR IMAGING GUIDED PORT INSERTION  10/27/2020   KYPHOPLASTY N/A 10/28/2020   Procedure: T12 and L3 KYPHOPLASTY;  Surgeon: Hessie Knows, MD;  Location: [facility] ORS;  Service: Orthopedics;  Laterality: N/A;   TONSILLECTOMY     WRIST SURGERY Right 05/1995    FAMILY HISTORY: Family History  Problem Relation Age of Onset   Colon polyps Sister    Colon cancer Father 83   Diabetes Mother    Breast cancer Neg Hx     ADVANCED DIRECTIVES (Y/N):  N  HEALTH MAINTENANCE: Social History   Tobacco Use   Smoking status: Former    Packs/day: 1.00    Years: 4.00    Pack years: 4.00    Types: Cigarettes    Quit date: 1982    Years since quitting: 40.8   Smokeless tobacco: Never  Substance Use Topics   Alcohol use: Yes    Alcohol/week: 1.0 - 2.0 standard drink    Types: 1 -  2 Glasses of wine per week    Comment: occassionally   Drug use: No     Colonoscopy:  PAP:  Bone density:  Lipid panel:  No Known Allergies  Current Outpatient Medications  Medication Sig Dispense Refill   acyclovir (ZOVIRAX) 400 MG tablet Take 1 tablet (400 mg total) by mouth 2 (two) times daily. 60 tablet 5   ASPIRIN 81 PO Take 81 mg by mouth daily.     atorvastatin (LIPITOR) 20 MG tablet Take 20 mg by mouth daily.     busPIRone (BUSPAR) 30 MG tablet Take 30 mg by mouth 2 (two) times daily.      CALCIUM-VITAMIN D PO Take 1 tablet by mouth daily.     cyclobenzaprine (FLEXERIL) 10 MG tablet Take 1 tablet (10 mg total) by mouth at bedtime. 30 tablet 2   dexamethasone (DECADRON) 4 MG tablet Take 5 tabs (20 mg) weekly the day after daratumumab for 12 weeks. Take with breakfast.     lenalidomide (REVLIMID) 10 MG capsule Take 1 capsule (10 mg total) by mouth daily. Take for 14 days, then hold for 7 days. 14 capsule 0   lidocaine-prilocaine (EMLA) cream Apply 1 application topically as needed. 30 g 2   liraglutide (VICTOZA) 18 MG/3ML SOPN Inject 1.2 mg into the skin daily.     meloxicam (MOBIC) 15 MG tablet Take 15 mg by mouth daily.     metFORMIN (GLUCOPHAGE) 500 MG tablet Take 1,000 mg by mouth 2 (two) times daily with a meal.     methocarbamol (ROBAXIN) 500 MG tablet Take 1 tablet (500 mg total) by mouth every 6 (six) hours as needed for muscle spasms. 40 tablet 1   metoprolol succinate (TOPROL-XL) 50 MG 24 hr tablet Take 50 mg by mouth 2 (two) times daily. Take with or immediately following a meal.     Misc Natural Products (GLUCOSAMINE CHOND CMP TRIPLE) TABS Take 1 tablet by mouth daily.     morphine (MS CONTIN) 15 MG 12 hr tablet Take 2 tablets (30 mg total) by mouth every 12 (twelve) hours. 120 tablet 0   Multiple Vitamin (MULTIVITAMIN WITH MINERALS) TABS tablet Take 1 tablet by mouth daily.     omeprazole (PRILOSEC) 20 MG capsule Take 20 mg by mouth daily.     ondansetron (ZOFRAN) 8 MG tablet Take 1 tablet (8 mg total) by mouth 2 (two) times daily as needed (Nausea or vomiting). 60 tablet 2   oxyCODONE-acetaminophen (PERCOCET/ROXICET) 5-325 MG tablet TAKE 1-2 TABLETS BY MOUTH EVERY 4 HOURS AS NEEDED FOR SEVERE PAIN 90 tablet 0   polyethylene glycol (MIRALAX / GLYCOLAX) 17 g packet Take by mouth daily as needed for mild constipation.     prochlorperazine (COMPAZINE) 10 MG tablet Take 1 tablet (10 mg total) by mouth every 6 (six) hours as needed (Nausea or vomiting). 60 tablet 2    traMADol (ULTRAM) 50 MG tablet TAKE 1 TABLET BY MOUTH TWICE DAILY AS NEEDED FOR MODERATE PAIN 60 tablet 1   No current facility-administered medications for this visit.   Facility-Administered Medications Ordered in Other Visits  Medication Dose Route Frequency Provider Last Rate Last Admin   heparin lock flush 100 unit/mL  500 Units Intravenous Once Lloyd Huger, MD       sodium chloride flush (NS) 0.9 % injection 10 mL  10 mL Intravenous PRN Lloyd Huger, MD   10 mL at 11/23/20 0932   sodium chloride flush (NS) 0.9 % injection 10 mL  10 mL Intravenous PRN Lloyd Huger, MD   10 mL at 12/14/20 0909    OBJECTIVE: Vitals:   12/14/20 0918  BP: (!) 121/47  Pulse: 87  Resp: 16  Temp: 97.8 F (36.6 C)  SpO2: 100%     Body mass index is 25.46 kg/m.    ECOG FS:1 - Symptomatic but completely ambulatory  General: Well-developed, well-nourished, no acute distress. Eyes: Pink conjunctiva, anicteric sclera. HEENT: Normocephalic, moist mucous membranes. Lungs: No audible wheezing or coughing. Heart: Regular rate and rhythm. Abdomen: Soft, nontender, no obvious distention. Musculoskeletal: Scant edema, cyanosis, or clubbing. Neuro: Alert, answering all questions appropriately. Cranial nerves grossly intact. Skin: No rashes or petechiae noted. Psych: Normal affect.   LAB RESULTS:  Lab Results  Component Value Date   NA 136 12/14/2020   K 4.5 12/14/2020   CL 105 12/14/2020   CO2 24 12/14/2020   GLUCOSE 155 (H) 12/14/2020   BUN 12 12/14/2020   CREATININE 1.09 (H) 12/14/2020   CALCIUM 8.0 (L) 12/14/2020   PROT 6.8 12/14/2020   ALBUMIN 3.7 12/14/2020   AST 26 12/14/2020   ALT 16 12/14/2020   ALKPHOS 222 (H) 12/14/2020   BILITOT 0.4 12/14/2020   GFRNONAA 57 (L) 12/14/2020    Lab Results  Component Value Date   WBC 3.6 (L) 12/14/2020   NEUTROABS 2.1 12/14/2020   HGB 9.2 (L) 12/14/2020   HCT 28.9 (L) 12/14/2020   MCV 100.7 (H) 12/14/2020   PLT 248  12/14/2020     STUDIES: No results found.   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  PLAN:      1.  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  She also has elevated lambda free light chains of 432.1.  Immunoglobulins and SPEP are normal.  Bone marrow biopsy completed on August 26, 2020 revealed 25% plasma cells along with the deletion of 1p which is considered poor prognosis.  PET scan results from September 06, 2020 reviewed independently with 3 hypermetabolic lesions in right C5-6 facets, right iliac crest lesion, and L3 vertebral lesion.  After lengthy discussion with the patient, she agreed to pursue chemotherapy with daratumumab, Velcade, Revlimid, and dexamethasone followed by autologous bone marrow transplant.  Patient will receive weekly daratumumab for the first 3-4 cycles along with Velcade on days 1, 4, 8, and 11.  She will receive Revlimid on days 1 through 14 of a 21-day cycle.  Plan to do 4-6 cycles prior to autologous bone cell transplant at [facility].  Given patient's persistent thrombocytopenia, will discontinue day 15 which is daratumumab only for all subsequent cycles.  Proceed with cycle 3, day 1 of treatment today.  Return to clinic on Friday for Velcade only and then in 1 week for further evaluation and consideration of cycle 3, day 8. 2.  Elevated liver enzymes: Resolved.   3.  Pain: Improved.  Patient has now completed XRT.  She had kyphoplasty x2.  Better controlled on 30 mg MS Contin every 12 hours.  Patient continues to take Percocet as needed sparingly.  4.  Hypercalcemia: Resolved.  Patient's calcium is 8.0 today.  She last received Zometa on November 30, 2020. 5.  Renal insufficiency: Essentially resolved.  Patient's creatinine is 1.09.   6.  Neutropenia: Resolved. 7.  Anemia: Chronic and unchanged.  Patient's hemoglobin is 9.2 today. 8.  Thrombocytopenia: Resolved.  Discontinue day 15 daratumumab with all  subsequent cycles. 9.  Peripheral edema: Mild.  Possibly secondary to fluid retention.  Recommended compression stockings and elevation.  Patient expressed understanding and was in agreement with this plan. She also understands that She can call clinic at any time with any questions, concerns, or complaints.   Cancer Staging Lambda light chain myeloma ([place]) Staging form: Plasma Cell Myeloma and Plasma Cell Disorders, AJCC 8th Edition - Clinical stage from 09/16/2020: RISS Stage I (Beta-2-microglobulin (mg/L): 2.5, Albumin (g/dL): 4.9, ISS: Stage I, High-risk cytogenetics: Absent, LDH: Normal) - Signed by Lloyd Huger, MD on 09/16/2020 Beta 2 microglobulin range (mg/L): Less than 3.5 Albumin range (g/dL): Greater than or equal to 3.5 Cytogenetics: 1p deletion  Lloyd Huger, MD   12/14/2020 10:01 AM

## 2020-12-14 ENCOUNTER — Other Ambulatory Visit: Payer: Self-pay

## 2020-12-14 ENCOUNTER — Inpatient Hospital Stay: Payer: BC Managed Care – PPO

## 2020-12-14 ENCOUNTER — Inpatient Hospital Stay (HOSPITAL_BASED_OUTPATIENT_CLINIC_OR_DEPARTMENT_OTHER): Payer: BC Managed Care – PPO | Admitting: Oncology

## 2020-12-14 VITALS — BP 105/69 | HR 85 | Resp 16

## 2020-12-14 VITALS — BP 121/47 | HR 87 | Temp 97.8°F | Resp 16 | Wt 139.2 lb

## 2020-12-14 DIAGNOSIS — C9000 Multiple myeloma not having achieved remission: Secondary | ICD-10-CM

## 2020-12-14 DIAGNOSIS — E7849 Other hyperlipidemia: Secondary | ICD-10-CM

## 2020-12-14 DIAGNOSIS — Z5112 Encounter for antineoplastic immunotherapy: Secondary | ICD-10-CM | POA: Diagnosis not present

## 2020-12-14 DIAGNOSIS — E119 Type 2 diabetes mellitus without complications: Secondary | ICD-10-CM

## 2020-12-14 LAB — CBC WITH DIFFERENTIAL/PLATELET
Abs Immature Granulocytes: 0.01 10*3/uL (ref 0.00–0.07)
Basophils Absolute: 0 10*3/uL (ref 0.0–0.1)
Basophils Relative: 1 %
Eosinophils Absolute: 0.3 10*3/uL (ref 0.0–0.5)
Eosinophils Relative: 10 %
HCT: 28.9 % — ABNORMAL LOW (ref 36.0–46.0)
Hemoglobin: 9.2 g/dL — ABNORMAL LOW (ref 12.0–15.0)
Immature Granulocytes: 0 %
Lymphocytes Relative: 12 %
Lymphs Abs: 0.4 10*3/uL — ABNORMAL LOW (ref 0.7–4.0)
MCH: 32.1 pg (ref 26.0–34.0)
MCHC: 31.8 g/dL (ref 30.0–36.0)
MCV: 100.7 fL — ABNORMAL HIGH (ref 80.0–100.0)
Monocytes Absolute: 0.6 10*3/uL (ref 0.1–1.0)
Monocytes Relative: 18 %
Neutro Abs: 2.1 10*3/uL (ref 1.7–7.7)
Neutrophils Relative %: 59 %
Platelets: 248 10*3/uL (ref 150–400)
RBC: 2.87 MIL/uL — ABNORMAL LOW (ref 3.87–5.11)
RDW: 14.6 % (ref 11.5–15.5)
WBC: 3.6 10*3/uL — ABNORMAL LOW (ref 4.0–10.5)
nRBC: 0 % (ref 0.0–0.2)

## 2020-12-14 LAB — COMPREHENSIVE METABOLIC PANEL
ALT: 16 U/L (ref 0–44)
AST: 26 U/L (ref 15–41)
Albumin: 3.7 g/dL (ref 3.5–5.0)
Alkaline Phosphatase: 222 U/L — ABNORMAL HIGH (ref 38–126)
Anion gap: 7 (ref 5–15)
BUN: 12 mg/dL (ref 8–23)
CO2: 24 mmol/L (ref 22–32)
Calcium: 8 mg/dL — ABNORMAL LOW (ref 8.9–10.3)
Chloride: 105 mmol/L (ref 98–111)
Creatinine, Ser: 1.09 mg/dL — ABNORMAL HIGH (ref 0.44–1.00)
GFR, Estimated: 57 mL/min — ABNORMAL LOW (ref >60)
Glucose, Bld: 155 mg/dL — ABNORMAL HIGH (ref 70–99)
Potassium: 4.5 mmol/L (ref 3.5–5.1)
Sodium: 136 mmol/L (ref 135–145)
Total Bilirubin: 0.4 mg/dL (ref 0.3–1.2)
Total Protein: 6.8 g/dL (ref 6.5–8.1)

## 2020-12-14 LAB — URINALYSIS, DIPSTICK ONLY
Bilirubin Urine: NEGATIVE
Glucose, UA: NEGATIVE mg/dL
Ketones, ur: NEGATIVE mg/dL
Nitrite: NEGATIVE
Protein, ur: NEGATIVE mg/dL
Specific Gravity, Urine: 1.009 (ref 1.005–1.030)
pH: 6 (ref 5.0–8.0)

## 2020-12-14 LAB — LIPID PANEL
Cholesterol: 140 mg/dL (ref 0–200)
HDL: 48 mg/dL (ref >40)
LDL Cholesterol: 41 mg/dL (ref 0–99)
Total CHOL/HDL Ratio: 2.9 RATIO
Triglycerides: 257 mg/dL — ABNORMAL HIGH (ref <150)
VLDL: 51 mg/dL — ABNORMAL HIGH (ref 0–40)

## 2020-12-14 LAB — HEMOGLOBIN A1C
Hgb A1c MFr Bld: 6.9 % — ABNORMAL HIGH (ref 4.8–5.6)
Mean Plasma Glucose: 151.33 mg/dL

## 2020-12-14 MED ORDER — ACETAMINOPHEN 325 MG PO TABS
650.0000 mg | ORAL_TABLET | Freq: Once | ORAL | Status: AC
  Administered 2020-12-14: 650 mg via ORAL
  Filled 2020-12-14: qty 2

## 2020-12-14 MED ORDER — DEXAMETHASONE 4 MG PO TABS
20.0000 mg | ORAL_TABLET | Freq: Once | ORAL | Status: AC
  Administered 2020-12-14: 20 mg via ORAL
  Filled 2020-12-14: qty 5

## 2020-12-14 MED ORDER — SODIUM CHLORIDE 0.9% FLUSH
10.0000 mL | INTRAVENOUS | Status: DC | PRN
  Administered 2020-12-14: 10 mL via INTRAVENOUS
  Filled 2020-12-14: qty 10

## 2020-12-14 MED ORDER — HEPARIN SOD (PORK) LOCK FLUSH 100 UNIT/ML IV SOLN
INTRAVENOUS | Status: AC
  Administered 2020-12-14: 500 [IU] via INTRAVENOUS
  Filled 2020-12-14: qty 5

## 2020-12-14 MED ORDER — HEPARIN SOD (PORK) LOCK FLUSH 100 UNIT/ML IV SOLN
500.0000 [IU] | Freq: Once | INTRAVENOUS | Status: AC
  Filled 2020-12-14: qty 5

## 2020-12-14 MED ORDER — DARATUMUMAB-HYALURONIDASE-FIHJ 1800-30000 MG-UT/15ML ~~LOC~~ SOLN
1800.0000 mg | Freq: Once | SUBCUTANEOUS | Status: AC
  Administered 2020-12-14: 1800 mg via SUBCUTANEOUS
  Filled 2020-12-14: qty 15

## 2020-12-14 MED ORDER — DIPHENHYDRAMINE HCL 25 MG PO CAPS
25.0000 mg | ORAL_CAPSULE | Freq: Once | ORAL | Status: AC
  Administered 2020-12-14: 25 mg via ORAL
  Filled 2020-12-14: qty 1

## 2020-12-14 MED ORDER — BORTEZOMIB CHEMO SQ INJECTION 3.5 MG (2.5MG/ML)
1.3000 mg/m2 | Freq: Once | INTRAMUSCULAR | Status: AC
  Administered 2020-12-14: 2.25 mg via SUBCUTANEOUS
  Filled 2020-12-14: qty 0.9

## 2020-12-14 NOTE — Progress Notes (Signed)
Pt c/o mild nausea, "some" shortness of breath and reports pitting edema in ankles over the weekend but has improved.

## 2020-12-14 NOTE — Addendum Note (Signed)
Addended by: Vanice Sarah on: 12/14/2020 09:49 AM   Modules accepted: Orders

## 2020-12-14 NOTE — Patient Instructions (Addendum)
[facility]  Discharge Instructions: Thank you for choosing [facility] to provide your oncology and hematology care.  If you have a lab appointment with the [facility], please go directly to the [facility] and check in at the registration area.  Wear comfortable clothing and clothing appropriate for easy access to any Portacath or PICC line.   We strive to give you quality time with your provider. You may need to reschedule your appointment if you arrive late (15 or more minutes).  Arriving late affects you and other patients whose appointments are after yours.  Also, if you miss three or more appointments without notifying the office, you may be dismissed from the clinic at the provider's discretion.      For prescription refill requests, have your pharmacy contact our office and allow 72 hours for refills to be completed.    Today you received the following chemotherapy and/or immunotherapy agents - daratumumab, Velcade      To help prevent nausea and vomiting after your treatment, we encourage you to take your nausea medication as directed.  BELOW ARE SYMPTOMS THAT SHOULD BE REPORTED IMMEDIATELY: *FEVER GREATER THAN 100.4 F (38 C) OR HIGHER *CHILLS OR SWEATING *NAUSEA AND VOMITING THAT IS NOT CONTROLLED WITH YOUR NAUSEA MEDICATION *UNUSUAL SHORTNESS OF BREATH *UNUSUAL BRUISING OR BLEEDING *URINARY PROBLEMS (pain or burning when urinating, or frequent urination) *BOWEL PROBLEMS (unusual diarrhea, constipation, pain near the anus) TENDERNESS IN MOUTH AND THROAT WITH OR WITHOUT PRESENCE OF ULCERS (sore throat, sores in mouth, or a toothache) UNUSUAL RASH, SWELLING OR PAIN  UNUSUAL VAGINAL DISCHARGE OR ITCHING   Items with * indicate a potential emergency and should be followed up as soon as possible or go to the Emergency Department if any problems should occur.  Please show the CHEMOTHERAPY ALERT CARD or IMMUNOTHERAPY ALERT CARD  at check-in to the Emergency Department and triage nurse.  Should you have questions after your visit or need to cancel or reschedule your appointment, please contact [facility]  [phone] and follow the prompts.  Office hours are 8:00 a.m. to 4:30 p.m. Monday - Friday. Please note that voicemails left after 4:00 p.m. may not be returned until the following business day.  We are closed weekends and major holidays. You have access to a nurse at all times for urgent questions. Please call the main number to the clinic [phone] and follow the prompts.  For any non-urgent questions, you may also contact your provider using MyChart. We now offer e-Visits for anyone 60 and older to request care online for non-urgent symptoms. For details visit [email or web address].   Also download the MyChart app! Go to the app store, search "MyChart", open the app, select [facility], and log in with your MyChart username and password.  Due to Covid, a mask is required upon entering the hospital/clinic. If you do not have a mask, one will be given to you upon arrival. For doctor visits, patients may have 1 support person aged 53 or older with them. For treatment visits, patients cannot have anyone with them due to current Covid guidelines and our immunocompromised population.   Daratumumab; Hyaluronidase Injection What is this medication? DARATUMUMAB; HYALURONIDASE (dar a toom ue mab / hye al ur ON i dase) is a monoclonal antibody. Hyaluronidase is used to improve the effects of daratumumab. It treats certain types of cancer. Some of the cancers treated are multiple myeloma and light-chain amyloidosis. This medicine  may be used for other purposes; ask your health care provider or pharmacist if you have questions. COMMON BRAND NAME(S): DARZALEX FASPRO What should I tell my care team before I take this medication? They need to know if you have any of these conditions: heart  disease infection especially a viral infection such as chickenpox, cold sores, herpes, or hepatitis B lung or breathing disease an unusual or allergic reaction to daratumumab, hyaluronidase, other medicines, foods, dyes, or preservatives pregnant or trying to get pregnant breast-feeding How should I use this medication? This medicine is for injection under the skin. It is given by a health care professional in a hospital or clinic setting. Talk to your pediatrician regarding the use of this medicine in children. Special care may be needed. Overdosage: If you think you have taken too much of this medicine contact a poison control center or emergency room at once. NOTE: This medicine is only for you. Do not share this medicine with others. What if I miss a dose? Keep appointments for follow-up doses as directed. It is important not to miss your dose. Call your doctor or health care professional if you are unable to keep an appointment. What may interact with this medication? Interactions have not been studied. This list may not describe all possible interactions. Give your health care provider a list of all the medicines, herbs, non-prescription drugs, or dietary supplements you use. Also tell them if you smoke, drink alcohol, or use illegal drugs. Some items may interact with your medicine. What should I watch for while using this medication? Your condition will be monitored carefully while you are receiving this medicine. This medicine can cause serious allergic reactions. To reduce your risk, your health care provider may give you other medicine to take before receiving this one. Be sure to follow the directions from your health care provider. This medicine can affect the results of blood tests to match your blood type. These changes can last for up to 6 months after the final dose. Your healthcare provider will do blood tests to match your blood type before you start treatment. Tell all of your  healthcare providers that you are being treated with this medicine before receiving a blood transfusion. This medicine can affect the results of some tests used to determine treatment response; extra tests may be needed to evaluate response. Do not become pregnant while taking this medicine or for 3 months after stopping it. Women should inform their health care provider if they wish to become pregnant or think they might be pregnant. There is a potential for serious side effects to an unborn child. Talk to your health care provider for more information. Do not breast-feed an infant while taking this medicine. What side effects may I notice from receiving this medication? Side effects that you should report to your doctor or health care professional as soon as possible: allergic reactions like skin rash, itching or hives, swelling of the face, lips, or tongue blurred vision breathing problems chest pain cough dizziness fast heartbeat feeling faint or lightheaded headache low blood counts - this medicine may decrease the number of white blood cells, red blood cells and platelets. You may be at increased risk for infections and bleeding. nausea, vomiting shortness of breath signs of decreased platelets or bleeding - bruising, pinpoint red spots on the skin, black, tarry stools, blood in the urine signs of decreased red blood cells - unusually weak or tired, feeling faint or lightheaded, falls signs of infection -  fever or chills, cough, sore throat, pain or difficulty passing urine Side effects that usually do not require medical attention (report these to your doctor or health care professional if they continue or are bothersome): back pain constipation diarrhea pain, tingling, numbness in the hands or feet muscle cramps swelling of the ankles, feet, hands tiredness trouble sleeping This list may not describe all possible side effects. Call your doctor for medical advice about side  effects. You may report side effects to FDA at 1-800-FDA-1088. Where should I keep my medication? This drug is given in a hospital or clinic and will not be stored at home. NOTE: This sheet is a summary. It may not cover all possible information. If you have questions about this medicine, talk to your doctor, pharmacist, or health care provider.  2022 Elsevier/Gold Standard (2020-03-25 12:51:54)  Bortezomib injection What is this medication? BORTEZOMIB (bor TEZ oh mib) targets proteins in cancer cells and stops the cancer cells from growing. It treats multiple myeloma and mantle cell lymphoma. This medicine may be used for other purposes; ask your health care provider or pharmacist if you have questions. COMMON BRAND NAME(S): Velcade What should I tell my care team before I take this medication? They need to know if you have any of these conditions: dehydration diabetes (high blood sugar) heart disease liver disease tingling of the fingers or toes or other nerve disorder an unusual or allergic reaction to bortezomib, mannitol, boron, other medicines, foods, dyes, or preservatives pregnant or trying to get pregnant breast-feeding How should I use this medication? This medicine is injected into a vein or under the skin. It is given by a health care provider in a hospital or clinic setting. Talk to your health care provider about the use of this medicine in children. Special care may be needed. Overdosage: If you think you have taken too much of this medicine contact a poison control center or emergency room at once. NOTE: This medicine is only for you. Do not share this medicine with others. What if I miss a dose? Keep appointments for follow-up doses. It is important not to miss your dose. Call your health care provider if you are unable to keep an appointment. What may interact with this medication? This medicine may interact with the following medications: ketoconazole rifampin This  list may not describe all possible interactions. Give your health care provider a list of all the medicines, herbs, non-prescription drugs, or dietary supplements you use. Also tell them if you smoke, drink alcohol, or use illegal drugs. Some items may interact with your medicine. What should I watch for while using this medication? Your condition will be monitored carefully while you are receiving this medicine. You may need blood work done while you are taking this medicine. You may get drowsy or dizzy. Do not drive, use machinery, or do anything that needs mental alertness until you know how this medicine affects you. Do not stand up or sit up quickly, especially if you are an older patient. This reduces the risk of dizzy or fainting spells This medicine may increase your risk of getting an infection. Call your health care provider for advice if you get a fever, chills, sore throat, or other symptoms of a cold or flu. Do not treat yourself. Try to avoid being around people who are sick. Check with your health care provider if you have severe diarrhea, nausea, and vomiting, or if you sweat a lot. The loss of too much body fluid may  make it dangerous for you to take this medicine. Do not become pregnant while taking this medicine or for 7 months after stopping it. Women should inform their health care provider if they wish to become pregnant or think they might be pregnant. Men should not father a child while taking this medicine and for 4 months after stopping it. There is a potential for serious harm to an unborn child. Talk to your health care provider for more information. Do not breast-feed an infant while taking this medicine or for 2 months after stopping it. This medicine may make it more difficult to get pregnant or father a child. Talk to your health care provider if you are concerned about your fertility. What side effects may I notice from receiving this medication? Side effects that you  should report to your doctor or health care professional as soon as possible: allergic reactions (skin rash; itching or hives; swelling of the face, lips, or tongue) bleeding (bloody or black, tarry stools; red or dark brown urine; spitting up blood or brown material that looks like coffee grounds; red spots on the skin; unusual bruising or bleeding from the eye, gums, or nose) blurred vision or changes in vision confusion constipation headache heart failure (trouble breathing; fast, irregular heartbeat; sudden weight gain; swelling of the ankles, feet, hands) infection (fever, chills, cough, sore throat, pain or trouble passing urine) lack or loss of appetite liver injury (dark yellow or brown urine; general ill feeling or flu-like symptoms; loss of appetite, right upper belly pain; yellowing of the eyes or skin) low blood pressure (dizziness; feeling faint or lightheaded, falls; unusually weak or tired) muscle cramps pain, redness, or irritation at site where injected pain, tingling, numbness in the hands or feet seizures trouble breathing unusual bruising or bleeding Side effects that usually do not require medical attention (report to your doctor or health care professional if they continue or are bothersome): diarrhea nausea stomach pain trouble sleeping vomiting This list may not describe all possible side effects. Call your doctor for medical advice about side effects. You may report side effects to FDA at 1-800-FDA-1088. Where should I keep my medication? This medicine is given in a hospital or clinic. It will not be stored at home. NOTE: This sheet is a summary. It may not cover all possible information. If you have questions about this medicine, talk to your doctor, pharmacist, or health care provider.  2022 Elsevier/Gold Standard (2020-02-05 13:22:53)

## 2020-12-14 NOTE — Progress Notes (Addendum)
Labs Ordered per written order from Benita Stabile, MD

## 2020-12-15 LAB — KAPPA/LAMBDA LIGHT CHAINS
Kappa free light chain: 11.2 mg/L (ref 3.3–19.4)
Kappa, lambda light chain ratio: 0.93 (ref 0.26–1.65)
Lambda free light chains: 12.1 mg/L (ref 5.7–26.3)

## 2020-12-17 ENCOUNTER — Other Ambulatory Visit: Payer: Self-pay

## 2020-12-17 ENCOUNTER — Inpatient Hospital Stay: Payer: BC Managed Care – PPO

## 2020-12-17 VITALS — BP 117/51 | HR 91 | Temp 97.1°F | Resp 16 | Wt 139.0 lb

## 2020-12-17 DIAGNOSIS — Z5112 Encounter for antineoplastic immunotherapy: Secondary | ICD-10-CM | POA: Diagnosis not present

## 2020-12-17 DIAGNOSIS — C9000 Multiple myeloma not having achieved remission: Secondary | ICD-10-CM

## 2020-12-17 MED ORDER — BORTEZOMIB CHEMO SQ INJECTION 3.5 MG (2.5MG/ML)
1.3000 mg/m2 | Freq: Once | INTRAMUSCULAR | Status: AC
  Administered 2020-12-17: 2.25 mg via SUBCUTANEOUS
  Filled 2020-12-17: qty 0.9

## 2020-12-17 MED ORDER — PROCHLORPERAZINE MALEATE 10 MG PO TABS
10.0000 mg | ORAL_TABLET | Freq: Once | ORAL | Status: AC
  Administered 2020-12-17: 10 mg via ORAL
  Filled 2020-12-17: qty 1

## 2020-12-17 MED FILL — CYCLOBENZAPR 10MG TAB: 30 days supply | Qty: 30

## 2020-12-17 NOTE — Progress Notes (Signed)
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  INTERVAL HISTORY: Patient returns to clinic today for further evaluation and consideration of cycle 3, day 8 of DVRd.  She currently feels well.  She only has minimal flank/rib pain.  She continues to have chronic weakness and fatigue.  She has no neurologic complaints.  She denies any recent fevers.  She has no chest pain, shortness of breath, cough, or hemoptysis.  She denies any vomiting, constipation, or diarrhea.  She has no urinary complaints.  Patient offers no further specific complaints today.  REVIEW OF SYSTEMS:   Review of Systems  Constitutional:  Positive for malaise/fatigue. Negative for fever and weight loss.  Respiratory: Negative.  Negative for cough, hemoptysis and shortness of breath.   Cardiovascular: Negative.  Negative for chest pain and leg swelling.  Gastrointestinal: Negative.  Negative for abdominal pain and nausea.  Genitourinary: Negative.  Negative for dysuria.  Musculoskeletal: Negative.  Negative for back pain, falls and joint pain.  Skin: Negative.  Negative for rash.  Neurological:  Positive for weakness. Negative for dizziness, tremors, focal weakness and headaches.  Psychiatric/Behavioral: Negative.  The patient is not nervous/anxious.    As per HPI. Otherwise, a complete review of systems is negative.  PAST MEDICAL HISTORY: Past Medical History:  Diagnosis Date   Anxiety    Diabetes mellitus without complication ([place]) 2683   GERD (gastroesophageal reflux disease)    Hyperlipidemia    Personal history of colonic polyps    Squamous cell carcinoma of skin 11/25/2013   Left chest. WD SCC with superficial  infiltration.   Tachycardia     PAST SURGICAL HISTORY: Past Surgical History:  Procedure Laterality Date   AUGMENTATION MAMMAPLASTY Bilateral 4196   silicone and replacement in 2001   [place], [place], 1998,2003, 2008, 2013   COLONOSCOPY WITH PROPOFOL N/A 08/16/2016   Procedure: COLONOSCOPY WITH PROPOFOL;  Surgeon: Robert Bellow, MD;  Location: [facility] ENDOSCOPY;  Service: Endoscopy;  Laterality: N/A;   DILATION AND CURETTAGE OF UTERUS     ENDOMETRIAL ABLATION  12/1991   ganglion cyst removal      IR IMAGING GUIDED PORT INSERTION  10/27/2020   KYPHOPLASTY N/A 10/28/2020   Procedure: T12 and L3 KYPHOPLASTY;  Surgeon: Hessie Knows, MD;  Location: [facility] ORS;  Service: Orthopedics;  Laterality: N/A;   TONSILLECTOMY     WRIST SURGERY Right 05/1995    FAMILY HISTORY: Family History  Problem Relation Age of Onset   Colon polyps Sister    Colon cancer Father 35   Diabetes Mother    Breast cancer Neg Hx     ADVANCED DIRECTIVES (Y/N):  N  HEALTH MAINTENANCE: Social History   Tobacco Use   Smoking status: Former    Packs/day: 1.00    Years: 4.00    Pack years: 4.00    Types: Cigarettes    Quit date: 1982    Years since quitting: 40.8   Smokeless tobacco: Never  Substance Use Topics   Alcohol use: Yes    Alcohol/week: 1.0 - 2.0 standard drink    Types: 1 - 2 Glasses of wine per week    Comment:  occassionally   Drug use: No     Colonoscopy:  PAP:  Bone density:  Lipid panel:  No Known Allergies  Current Outpatient Medications  Medication Sig Dispense Refill   acyclovir (ZOVIRAX) 400 MG tablet Take 1 tablet (400 mg total) by mouth 2 (two) times daily. 60 tablet 5   ASPIRIN 81 PO Take 81 mg by mouth daily.     atorvastatin (LIPITOR) 20 MG tablet Take 20 mg by mouth daily.     busPIRone (BUSPAR) 30 MG tablet Take 30 mg by mouth 2 (two) times daily.     CALCIUM-VITAMIN D PO Take 1 tablet by mouth  daily.     cyclobenzaprine (FLEXERIL) 10 MG tablet Take 1 tablet (10 mg total) by mouth at bedtime. 30 tablet 2   dexamethasone (DECADRON) 4 MG tablet Take 5 tabs (20 mg) weekly the day after daratumumab for 12 weeks. Take with breakfast.     lenalidomide (REVLIMID) 10 MG capsule Take 1 capsule (10 mg total) by mouth daily. Take for 14 days, then hold for 7 days. 14 capsule 0   lidocaine-prilocaine (EMLA) cream Apply 1 application topically as needed. 30 g 2   liraglutide (VICTOZA) 18 MG/3ML SOPN Inject 1.2 mg into the skin daily.     meloxicam (MOBIC) 15 MG tablet Take 15 mg by mouth daily.     metFORMIN (GLUCOPHAGE) 500 MG tablet Take 1,000 mg by mouth 2 (two) times daily with a meal.     methocarbamol (ROBAXIN) 500 MG tablet Take 1 tablet (500 mg total) by mouth every 6 (six) hours as needed for muscle spasms. 40 tablet 1   metoprolol succinate (TOPROL-XL) 50 MG 24 hr tablet Take 50 mg by mouth 2 (two) times daily. Take with or immediately following a meal.     Misc Natural Products (GLUCOSAMINE CHOND CMP TRIPLE) TABS Take 1 tablet by mouth daily.     morphine (MS CONTIN) 15 MG 12 hr tablet Take 2 tablets (30 mg total) by mouth every 12 (twelve) hours. 120 tablet 0   Multiple Vitamin (MULTIVITAMIN WITH MINERALS) TABS tablet Take 1 tablet by mouth daily.     omeprazole (PRILOSEC) 20 MG capsule Take 20 mg by mouth daily.     ondansetron (ZOFRAN) 8 MG tablet Take 1 tablet (8 mg total) by mouth 2 (two) times daily as needed (Nausea or vomiting). 60 tablet 2   oxyCODONE-acetaminophen (PERCOCET/ROXICET) 5-325 MG tablet TAKE 1-2 TABLETS BY MOUTH EVERY 4 HOURS AS NEEDED FOR SEVERE PAIN 90 tablet 0   polyethylene glycol (MIRALAX / GLYCOLAX) 17 g packet Take by mouth daily as needed for mild constipation.     prochlorperazine (COMPAZINE) 10 MG tablet Take 1 tablet (10 mg total) by mouth every 6 (six) hours as needed (Nausea or vomiting). 60 tablet 2   traMADol (ULTRAM) 50 MG tablet TAKE 1 TABLET BY MOUTH  TWICE DAILY AS NEEDED FOR MODERATE PAIN 60 tablet 1   No current facility-administered medications for this visit.   Facility-Administered Medications Ordered in Other Visits  Medication Dose Route Frequency Provider Last Rate Last Admin   bortezomib SQ (VELCADE) chemo injection (2.27m/mL concentration) 2.25 mg  1.3 mg/m2 (Treatment Plan Recorded) Subcutaneous Once FLloyd Huger MD       daratumumab-hyaluronidase-fihj ([facility]) 1800-30000 MG-UT/15ML chemo SQ injection 1,800 mg  1,800 mg Subcutaneous Once FLloyd Huger MD       heparin lock flush 100 unit/mL  500 Units Intravenous Once FLloyd Huger MD  sodium chloride flush (NS) 0.9 % injection 10 mL  10 mL Intravenous PRN Lloyd Huger, MD   10 mL at 11/23/20 0932    OBJECTIVE: Vitals:   12/21/20 1304  BP: 105/60  Pulse: 91  Resp: 18  Temp: (!) 96.6 F (35.9 C)  SpO2: 99%     Body mass index is 25.44 kg/m.    ECOG FS:1 - Symptomatic but completely ambulatory  General: Well-developed, well-nourished, no acute distress. Eyes: Pink conjunctiva, anicteric sclera. HEENT: Normocephalic, moist mucous membranes. Lungs: No audible wheezing or coughing. Heart: Regular rate and rhythm. Abdomen: Soft, nontender, no obvious distention. Musculoskeletal: No edema, cyanosis, or clubbing. Neuro: Alert, answering all questions appropriately. Cranial nerves grossly intact. Skin: No rashes or petechiae noted. Psych: Normal affect.   LAB RESULTS:  Lab Results  Component Value Date   NA 137 12/21/2020   K 4.1 12/21/2020   CL 106 12/21/2020   CO2 23 12/21/2020   GLUCOSE 131 (H) 12/21/2020   BUN 17 12/21/2020   CREATININE 1.16 (H) 12/21/2020   CALCIUM 8.4 (L) 12/21/2020   PROT 6.4 (L) 12/21/2020   ALBUMIN 3.7 12/21/2020   AST 24 12/21/2020   ALT 16 12/21/2020   ALKPHOS 166 (H) 12/21/2020   BILITOT 0.4 12/21/2020   GFRNONAA 53 (L) 12/21/2020    Lab Results  Component Value Date   WBC 3.9 (L)  12/21/2020   NEUTROABS 2.8 12/21/2020   HGB 9.0 (L) 12/21/2020   HCT 27.6 (L) 12/21/2020   MCV 100.7 (H) 12/21/2020   PLT 98 (L) 12/21/2020     STUDIES: No results found.   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  PLAN:      1.  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  Initially her lambda free light chains were elevated at 432.1, but now are within normal limits at 12.1.  Immunoglobulins and SPEP are normal.  Bone marrow biopsy completed on August 26, 2020 revealed 25% plasma cells along with the deletion of 1p which is considered poor prognosis.  PET scan results from September 06, 2020 reviewed independently with 3 hypermetabolic lesions in right C5-6 facets, right iliac crest lesion, and L3 vertebral lesion.  After lengthy discussion with the patient, she agreed to pursue chemotherapy with daratumumab, Velcade, Revlimid, and dexamethasone followed by autologous bone marrow transplant.  Patient will receive weekly daratumumab for the first 3-4 cycles along with Velcade on days 1, 4, 8, and 11.  She will receive Revlimid on days 1 through 14 of a 21-day cycle.  Plan to do 4 cycles and then refer back to [facility] for autologous bone cell transplant.  Given patient's persistent thrombocytopenia, will discontinue day 15 which is daratumumab only for all subsequent cycles.  Proceed with cycle 3, day 8 of treatment today.  Return to clinic Friday for Velcade only and then in 2 weeks for further evaluation and consideration of cycle 4, day 1.  Will repeat bone marrow biopsy at the conclusion of cycle 4.   2.  Elevated liver enzymes: Resolved.   3.  Pain: Improved.  Patient has now completed XRT.  She had kyphoplasty x2.  Better controlled on 30 mg MS Contin every 12 hours.  Patient continues to take Percocet as needed sparingly.  4.  Hypercalcemia: Resolved.  Patient's calcium is 8.4 today.  She last received Zometa on November 30, 2020. 5.  Renal insufficiency: Essentially resolved.  Patient's creatinine is 1.16 today. 6.  Leukopenia: Mild, monitor. 7.  Anemia: Chronic and unchanged.  Patient's hemoglobin is 9.0 today. 8.  Thrombocytopenia: Platelet count 98 today.  Proceed with treatment as above.  Discontinue day 15 daratumumab with all subsequent cycles. 9.  Peripheral edema: Patient does not complain of this today.  Possibly secondary to fluid retention.  Recommended compression stockings and elevation.  Patient expressed understanding and was in agreement with this plan. She also understands that She can call clinic at any time with any questions, concerns, or complaints.   Cancer Staging Lambda light chain myeloma ([place]) Staging form: Plasma Cell Myeloma and Plasma Cell Disorders, AJCC 8th Edition - Clinical stage from 09/16/2020: RISS Stage I (Beta-2-microglobulin (mg/L): 2.5, Albumin (g/dL): 4.9, ISS: Stage I, High-risk cytogenetics: Absent, LDH: Normal) - Signed by Lloyd Huger, MD on 09/16/2020 Beta 2 microglobulin range (mg/L): Less than 3.5 Albumin range (g/dL): Greater than or equal to 3.5 Cytogenetics: 1p deletion  Lloyd Huger, MD   12/21/2020 2:10 PM

## 2020-12-21 ENCOUNTER — Inpatient Hospital Stay: Payer: BC Managed Care – PPO

## 2020-12-21 ENCOUNTER — Inpatient Hospital Stay (HOSPITAL_BASED_OUTPATIENT_CLINIC_OR_DEPARTMENT_OTHER): Payer: BC Managed Care – PPO | Admitting: Oncology

## 2020-12-21 ENCOUNTER — Other Ambulatory Visit: Payer: Self-pay

## 2020-12-21 VITALS — BP 105/60 | HR 91 | Temp 96.6°F | Resp 18 | Wt 139.1 lb

## 2020-12-21 VITALS — BP 94/51 | HR 87 | Temp 96.4°F | Resp 18

## 2020-12-21 DIAGNOSIS — C9000 Multiple myeloma not having achieved remission: Secondary | ICD-10-CM

## 2020-12-21 DIAGNOSIS — Z5112 Encounter for antineoplastic immunotherapy: Secondary | ICD-10-CM | POA: Diagnosis not present

## 2020-12-21 LAB — CBC WITH DIFFERENTIAL/PLATELET
Abs Immature Granulocytes: 0.01 10*3/uL (ref 0.00–0.07)
Basophils Absolute: 0 10*3/uL (ref 0.0–0.1)
Basophils Relative: 0 %
Eosinophils Absolute: 0.4 10*3/uL (ref 0.0–0.5)
Eosinophils Relative: 11 %
HCT: 27.6 % — ABNORMAL LOW (ref 36.0–46.0)
Hemoglobin: 9 g/dL — ABNORMAL LOW (ref 12.0–15.0)
Immature Granulocytes: 0 %
Lymphocytes Relative: 11 %
Lymphs Abs: 0.4 10*3/uL — ABNORMAL LOW (ref 0.7–4.0)
MCH: 32.8 pg (ref 26.0–34.0)
MCHC: 32.6 g/dL (ref 30.0–36.0)
MCV: 100.7 fL — ABNORMAL HIGH (ref 80.0–100.0)
Monocytes Absolute: 0.2 10*3/uL (ref 0.1–1.0)
Monocytes Relative: 6 %
Neutro Abs: 2.8 10*3/uL (ref 1.7–7.7)
Neutrophils Relative %: 72 %
Platelets: 98 10*3/uL — ABNORMAL LOW (ref 150–400)
RBC: 2.74 MIL/uL — ABNORMAL LOW (ref 3.87–5.11)
RDW: 15.2 % (ref 11.5–15.5)
WBC: 3.9 10*3/uL — ABNORMAL LOW (ref 4.0–10.5)
nRBC: 0 % (ref 0.0–0.2)

## 2020-12-21 LAB — COMPREHENSIVE METABOLIC PANEL
ALT: 16 U/L (ref 0–44)
AST: 24 U/L (ref 15–41)
Albumin: 3.7 g/dL (ref 3.5–5.0)
Alkaline Phosphatase: 166 U/L — ABNORMAL HIGH (ref 38–126)
Anion gap: 8 (ref 5–15)
BUN: 17 mg/dL (ref 8–23)
CO2: 23 mmol/L (ref 22–32)
Calcium: 8.4 mg/dL — ABNORMAL LOW (ref 8.9–10.3)
Chloride: 106 mmol/L (ref 98–111)
Creatinine, Ser: 1.16 mg/dL — ABNORMAL HIGH (ref 0.44–1.00)
GFR, Estimated: 53 mL/min — ABNORMAL LOW (ref >60)
Glucose, Bld: 131 mg/dL — ABNORMAL HIGH (ref 70–99)
Potassium: 4.1 mmol/L (ref 3.5–5.1)
Sodium: 137 mmol/L (ref 135–145)
Total Bilirubin: 0.4 mg/dL (ref 0.3–1.2)
Total Protein: 6.4 g/dL — ABNORMAL LOW (ref 6.5–8.1)

## 2020-12-21 MED ORDER — SODIUM CHLORIDE 0.9% FLUSH
10.0000 mL | Freq: Once | INTRAVENOUS | Status: AC
  Administered 2020-12-21: 10 mL via INTRAVENOUS
  Filled 2020-12-21: qty 10

## 2020-12-21 MED ORDER — DEXAMETHASONE 4 MG PO TABS
20.0000 mg | ORAL_TABLET | Freq: Once | ORAL | Status: AC
  Administered 2020-12-21: 20 mg via ORAL
  Filled 2020-12-21: qty 5

## 2020-12-21 MED ORDER — ACETAMINOPHEN 325 MG PO TABS
650.0000 mg | ORAL_TABLET | Freq: Once | ORAL | Status: AC
  Administered 2020-12-21: 650 mg via ORAL
  Filled 2020-12-21: qty 2

## 2020-12-21 MED ORDER — DARATUMUMAB-HYALURONIDASE-FIHJ 1800-30000 MG-UT/15ML ~~LOC~~ SOLN
1800.0000 mg | Freq: Once | SUBCUTANEOUS | Status: AC
  Administered 2020-12-21: 1800 mg via SUBCUTANEOUS
  Filled 2020-12-21: qty 15

## 2020-12-21 MED ORDER — DIPHENHYDRAMINE HCL 25 MG PO CAPS
25.0000 mg | ORAL_CAPSULE | Freq: Once | ORAL | Status: AC
  Administered 2020-12-21: 25 mg via ORAL
  Filled 2020-12-21: qty 1

## 2020-12-21 MED ORDER — HEPARIN SOD (PORK) LOCK FLUSH 100 UNIT/ML IV SOLN
500.0000 [IU] | Freq: Once | INTRAVENOUS | Status: AC
  Filled 2020-12-21: qty 5

## 2020-12-21 MED ORDER — HEPARIN SOD (PORK) LOCK FLUSH 100 UNIT/ML IV SOLN
INTRAVENOUS | Status: AC
  Administered 2020-12-21: 500 [IU] via INTRAVENOUS
  Filled 2020-12-21: qty 5

## 2020-12-21 MED ORDER — BORTEZOMIB CHEMO SQ INJECTION 3.5 MG (2.5MG/ML)
1.3000 mg/m2 | Freq: Once | INTRAMUSCULAR | Status: AC
  Administered 2020-12-21: 2.25 mg via SUBCUTANEOUS
  Filled 2020-12-21: qty 0.9

## 2020-12-21 MED FILL — LIDO/PRILOCN 2.5-2.5% CRE: 30 days supply | Qty: 30

## 2020-12-21 NOTE — Progress Notes (Signed)
Platelets 98, Per Duwayne Heck CMA per Dr. Grayland Ormond okay to proceed with treatment.   Per Dr. Grayland Ormond pt to be monitored for 15-20 minutes post Darzalex injections (for this treatment and all future Darzalex injections unless stated otherwise by MD).  1515: Pt monitored per MD order post Darzalex injection. Injection site WNL, no swelling, redness, or pain noted. Pt and VS stable, no s/s of distress noted at discharge.

## 2020-12-21 NOTE — Progress Notes (Signed)
Plt 98 ok to treat per MD

## 2020-12-21 NOTE — Patient Instructions (Signed)
[facility]  Discharge Instructions: Thank you for choosing [facility] to provide your oncology and hematology care.  If you have a lab appointment with the [facility], please go directly to the [facility] and check in at the registration area.  Wear comfortable clothing and clothing appropriate for easy access to any Portacath or PICC line.   We strive to give you quality time with your provider. You may need to reschedule your appointment if you arrive late (15 or more minutes).  Arriving late affects you and other patients whose appointments are after yours.  Also, if you miss three or more appointments without notifying the office, you may be dismissed from the clinic at the provider's discretion.      For prescription refill requests, have your pharmacy contact our office and allow 72 hours for refills to be completed.    Today you received the following chemotherapy and/or immunotherapy agents Darzalex and Velcade      To help prevent nausea and vomiting after your treatment, we encourage you to take your nausea medication as directed.  BELOW ARE SYMPTOMS THAT SHOULD BE REPORTED IMMEDIATELY: *FEVER GREATER THAN 100.4 F (38 C) OR HIGHER *CHILLS OR SWEATING *NAUSEA AND VOMITING THAT IS NOT CONTROLLED WITH YOUR NAUSEA MEDICATION *UNUSUAL SHORTNESS OF BREATH *UNUSUAL BRUISING OR BLEEDING *URINARY PROBLEMS (pain or burning when urinating, or frequent urination) *BOWEL PROBLEMS (unusual diarrhea, constipation, pain near the anus) TENDERNESS IN MOUTH AND THROAT WITH OR WITHOUT PRESENCE OF ULCERS (sore throat, sores in mouth, or a toothache) UNUSUAL RASH, SWELLING OR PAIN  UNUSUAL VAGINAL DISCHARGE OR ITCHING   Items with * indicate a potential emergency and should be followed up as soon as possible or go to the Emergency Department if any problems should occur.  Please show the CHEMOTHERAPY ALERT CARD or IMMUNOTHERAPY ALERT CARD at  check-in to the Emergency Department and triage nurse.  Should you have questions after your visit or need to cancel or reschedule your appointment, please contact [facility]  [phone] and follow the prompts.  Office hours are 8:00 a.m. to 4:30 p.m. Monday - Friday. Please note that voicemails left after 4:00 p.m. may not be returned until the following business day.  We are closed weekends and major holidays. You have access to a nurse at all times for urgent questions. Please call the main number to the clinic [phone] and follow the prompts.  For any non-urgent questions, you may also contact your provider using MyChart. We now offer e-Visits for anyone 30 and older to request care online for non-urgent symptoms. For details visit [email or web address].   Also download the MyChart app! Go to the app store, search "MyChart", open the app, select [facility], and log in with your MyChart username and password.  Due to Covid, a mask is required upon entering the hospital/clinic. If you do not have a mask, one will be given to you upon arrival. For doctor visits, patients may have 1 support person aged 79 or older with them. For treatment visits, patients cannot have anyone with them due to current Covid guidelines and our immunocompromised population.

## 2020-12-21 NOTE — Progress Notes (Signed)
Pt has no concerns/complaints at this time. 

## 2020-12-24 ENCOUNTER — Inpatient Hospital Stay: Payer: BC Managed Care – PPO

## 2020-12-24 ENCOUNTER — Other Ambulatory Visit: Payer: Self-pay

## 2020-12-24 VITALS — BP 117/55 | HR 91 | Temp 97.8°F | Resp 18

## 2020-12-24 DIAGNOSIS — Z5112 Encounter for antineoplastic immunotherapy: Secondary | ICD-10-CM | POA: Diagnosis not present

## 2020-12-24 DIAGNOSIS — C9000 Multiple myeloma not having achieved remission: Secondary | ICD-10-CM

## 2020-12-24 MED ORDER — BORTEZOMIB CHEMO SQ INJECTION 3.5 MG (2.5MG/ML)
1.3000 mg/m2 | Freq: Once | INTRAMUSCULAR | Status: AC
  Administered 2020-12-24: 2.25 mg via SUBCUTANEOUS
  Filled 2020-12-24: qty 0.9

## 2020-12-24 MED ORDER — PROCHLORPERAZINE MALEATE 10 MG PO TABS
10.0000 mg | ORAL_TABLET | Freq: Once | ORAL | Status: AC
  Administered 2020-12-24: 10 mg via ORAL
  Filled 2020-12-24: qty 1

## 2020-12-24 NOTE — Progress Notes (Signed)
Using labs from 10/25, plt were 98  still ok to proceed with velcade today per MD

## 2020-12-24 NOTE — Progress Notes (Signed)
Per Dr. Gary Fleet office note on 12/21/20 platelets 98 and okay to proceed with Velcade only today, with no additional labs.

## 2020-12-24 NOTE — Patient Instructions (Signed)
[facility]  Discharge Instructions: Thank you for choosing [facility] to provide your oncology and hematology care.  If you have a lab appointment with the Cancer Center, please go directly to the Cancer Center and check in at the registration area.  Wear comfortable clothing and clothing appropriate for easy access to any Portacath or PICC line.   We strive to give you quality time with your provider. You may need to reschedule your appointment if you arrive late (15 or more minutes).  Arriving late affects you and other patients whose appointments are after yours.  Also, if you miss three or more appointments without notifying the office, you may be dismissed from the clinic at the provider's discretion.      For prescription refill requests, have your pharmacy contact our office and allow 72 hours for refills to be completed.    Today you received the following chemotherapy and/or immunotherapy agents Velcade      To help prevent nausea and vomiting after your treatment, we encourage you to take your nausea medication as directed.  BELOW ARE SYMPTOMS THAT SHOULD BE REPORTED IMMEDIATELY: *FEVER GREATER THAN 100.4 F (38 C) OR HIGHER *CHILLS OR SWEATING *NAUSEA AND VOMITING THAT IS NOT CONTROLLED WITH YOUR NAUSEA MEDICATION *UNUSUAL SHORTNESS OF BREATH *UNUSUAL BRUISING OR BLEEDING *URINARY PROBLEMS (pain or burning when urinating, or frequent urination) *BOWEL PROBLEMS (unusual diarrhea, constipation, pain near the anus) TENDERNESS IN MOUTH AND THROAT WITH OR WITHOUT PRESENCE OF ULCERS (sore throat, sores in mouth, or a toothache) UNUSUAL RASH, SWELLING OR PAIN  UNUSUAL VAGINAL DISCHARGE OR ITCHING   Items with * indicate a potential emergency and should be followed up as soon as possible or go to the Emergency Department if any problems should occur.  Please show the CHEMOTHERAPY ALERT CARD or IMMUNOTHERAPY ALERT CARD at check-in to  the Emergency Department and triage nurse.  Should you have questions after your visit or need to cancel or reschedule your appointment, please contact [facility]  [phone] and follow the prompts.  Office hours are 8:00 a.m. to 4:30 p.m. Monday - Friday. Please note that voicemails left after 4:00 p.m. may not be returned until the following business day.  We are closed weekends and major holidays. You have access to a nurse at all times for urgent questions. Please call the main number to the clinic [phone] and follow the prompts.  For any non-urgent questions, you may also contact your provider using MyChart. We now offer e-Visits for anyone 18 and older to request care online for non-urgent symptoms. For details visit [email or web address].   Also download the MyChart app! Go to the app store, search "MyChart", open the app, select [facility], and log in with your MyChart username and password.  Due to Covid, a mask is required upon entering the hospital/clinic. If you do not have a mask, one will be given to you upon arrival. For doctor visits, patients may have 1 support person aged 18 or older with them. For treatment visits, patients cannot have anyone with them due to current Covid guidelines and our immunocompromised population.  

## 2020-12-25 ENCOUNTER — Other Ambulatory Visit: Payer: Self-pay | Admitting: Oncology

## 2020-12-25 MED FILL — ACYCLOVIR 400MG TAB: 30 days supply | Qty: 60

## 2020-12-25 MED FILL — VICTOZA 3'S 18MG/3ML PEN: 30 days supply | Qty: 6

## 2020-12-25 MED FILL — MELOXICAM 15MG TAB: 30 days supply | Qty: 30

## 2020-12-25 MED FILL — TRAMADOL HCL 50MG TAB: 30 days supply | Qty: 60

## 2020-12-25 MED FILL — DEXAMETHASON 4MG TAB: 28 days supply | Qty: 20

## 2020-12-26 MED FILL — OMEPRAZOL RX 40MG CAP: 90 days supply | Qty: 90

## 2020-12-27 ENCOUNTER — Other Ambulatory Visit: Payer: Self-pay | Admitting: *Deleted

## 2020-12-27 DIAGNOSIS — C9000 Multiple myeloma not having achieved remission: Secondary | ICD-10-CM

## 2020-12-27 MED ORDER — LENALIDOMIDE 10 MG PO CAPS
10.0000 mg | ORAL_CAPSULE | Freq: Every day | ORAL | 0 refills | Status: DC
Start: 2020-12-27 — End: 2021-01-19

## 2020-12-27 MED FILL — REVLIMID 10MG CAP: 21 days supply | Qty: 14

## 2020-12-27 NOTE — Telephone Encounter (Signed)
[place] [phone] obtained 12/27/2020

## 2020-12-30 ENCOUNTER — Telehealth: Payer: Self-pay

## 2020-12-30 ENCOUNTER — Ambulatory Visit: Payer: BC Managed Care – PPO | Admitting: Dermatology

## 2020-12-30 ENCOUNTER — Other Ambulatory Visit: Payer: Self-pay

## 2020-12-30 DIAGNOSIS — C9000 Multiple myeloma not having achieved remission: Secondary | ICD-10-CM

## 2020-12-30 DIAGNOSIS — L578 Other skin changes due to chronic exposure to nonionizing radiation: Secondary | ICD-10-CM

## 2020-12-30 DIAGNOSIS — L820 Inflamed seborrheic keratosis: Secondary | ICD-10-CM

## 2020-12-30 MED FILL — ONDANSETRON 8MG TAB: 9 days supply | Qty: 18

## 2020-12-30 NOTE — Telephone Encounter (Signed)
Hannah Mcdonald at [facility] with scheduling information for Bone Marrow Biopsy. Pt is scheduled 01/24/2021 at 9 am with instructions to be at the [facility] at [place] stated she will relay message to pt.

## 2020-12-30 NOTE — Progress Notes (Signed)
   Follow-Up Visit   Subjective  Hannah Mcdonald is a 65 y.o. female who presents for the following: Skin Problem (Check 2 spot on the back, growing and changing color ).  She has several areas to be evaluated today. Pt recently diagnosed with Multiple myeloma, currently on Chemo. She will be going for a transplant in a few weeks.  The following portions of the chart were reviewed this encounter and updated as appropriate:   Tobacco  Allergies  Meds  Problems  Med Hx  Surg Hx  Fam Hx     Review of Systems:  No other skin or systemic complaints except as noted in HPI or Assessment and Plan.  Objective  Well appearing patient in no apparent distress; mood and affect are within normal limits.  A focused examination was performed including back. Relevant physical exam findings are noted in the Assessment and Plan.  back x 16 (16) Erythematous keratotic or waxy stuck-on papule or plaque.    Assessment & Plan  Inflamed seborrheic keratosis back x 16  Reassured benign age-related growth.  Recommend observation.  Discussed cryotherapy if spot(s) become irritated or inflamed.   Prior to procedure, discussed risks of blister formation, small wound, skin dyspigmentation, or rare scar following cryotherapy. Recommend Vaseline ointment to treated areas while healing.   Destruction of lesion - back x 16 Complexity: simple   Destruction method: cryotherapy   Informed consent: discussed and consent obtained   Timeout:  patient name, date of birth, surgical site, and procedure verified Lesion destroyed using liquid nitrogen: Yes   Region frozen until ice ball extended beyond lesion: Yes   Outcome: patient tolerated procedure well with no complications   Post-procedure details: wound care instructions given    Recent diagnosis of multiple myeloma.   She is currently on chemotherapy and is getting a bone marrow transplant in the next few weeks.  Actinic Damage - chronic, secondary to  cumulative UV radiation exposure/sun exposure over time - diffuse scaly erythematous macules with underlying dyspigmentation - Recommend daily broad spectrum sunscreen SPF 30+ to sun-exposed areas, reapply every 2 hours as needed.  - Recommend staying in the shade or wearing long sleeves, sun glasses (UVA+UVB protection) and wide brim hats (4-inch brim around the entire circumference of the hat). - Call for new or changing lesions.  Return if symptoms worsen or fail to improve.  IMarye Round, CMA, am acting as scribe for Sarina Ser, MD .  Documentation: I have reviewed the above documentation for accuracy and completeness, and I agree with the above.  Sarina Ser, MD

## 2020-12-30 NOTE — Progress Notes (Signed)
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  INTERVAL HISTORY: Patient returns to clinic today for further evaluation and consideration of cycle 4, day 1 of DVRd.  Several days ago she had a syncopal episode where she lost consciousness and has a large ecchymosis surrounding her left eye.  No head injury was noted.  CT scan of the chest was also normal.  She continues to have shortness of breath and peripheral edema.  She only has minimal flank/rib pain.  She continues to have chronic weakness and fatigue.  She has no neurologic complaints.  She denies any recent fevers.  She has no chest pain, cough, or hemoptysis.  She denies any vomiting, constipation, or diarrhea.  She has no urinary complaints.  Patient has no further specific complaints today.  REVIEW OF SYSTEMS:   Review of Systems  Constitutional:  Positive for malaise/fatigue. Negative for fever and weight loss.  Respiratory: Negative.  Negative for cough, hemoptysis and shortness of breath.   Cardiovascular:  Positive for leg swelling. Negative for chest pain.  Gastrointestinal: Negative.  Negative for abdominal pain and nausea.  Genitourinary: Negative.  Negative for dysuria.  Musculoskeletal:  Positive for falls. Negative for back pain and joint pain.  Skin: Negative.  Negative for rash.  Neurological:  Positive for weakness. Negative for dizziness, tremors, focal weakness and headaches.  Psychiatric/Behavioral: Negative.  The patient is not nervous/anxious.    As per HPI. Otherwise, a complete review of systems is negative.  PAST MEDICAL HISTORY: Past Medical History:  Diagnosis Date   Anxiety     Diabetes mellitus without complication ([place]) 4315   GERD (gastroesophageal reflux disease)    Hyperlipidemia    Personal history of colonic polyps    Squamous cell carcinoma of skin 11/25/2013   Left chest. WD SCC with superficial infiltration.   Tachycardia     PAST SURGICAL HISTORY: Past Surgical History:  Procedure Laterality Date   AUGMENTATION MAMMAPLASTY Bilateral 4008   silicone and replacement in 2001   [place], [place], 1998,2003, 2008, 2013   COLONOSCOPY WITH PROPOFOL N/A 08/16/2016   Procedure: COLONOSCOPY WITH PROPOFOL;  Surgeon: Robert Bellow, MD;  Location: [facility] ENDOSCOPY;  Service: Endoscopy;  Laterality: N/A;   DILATION AND CURETTAGE OF UTERUS     ENDOMETRIAL ABLATION  12/1991   ganglion cyst removal      IR IMAGING GUIDED PORT INSERTION  10/27/2020   KYPHOPLASTY N/A 10/28/2020   Procedure: T12 and L3 KYPHOPLASTY;  Surgeon: Hessie Knows, MD;  Location: [facility] ORS;  Service: Orthopedics;  Laterality: N/A;   TONSILLECTOMY     WRIST SURGERY Right 05/1995    FAMILY HISTORY: Family History  Problem Relation Age of Onset   Colon polyps Sister    Colon cancer Father 71   Diabetes Mother    Breast cancer Neg Hx     ADVANCED DIRECTIVES (Y/N):  N  HEALTH MAINTENANCE: Social History   Tobacco Use   Smoking status: Former    Packs/day: 1.00    Years: 4.00    Pack years: 4.00    Types: Cigarettes    Quit date: 1982  Years since quitting: 40.8   Smokeless tobacco: Never  Substance Use Topics   Alcohol use: Yes    Alcohol/week: 1.0 - 2.0 standard drink    Types: 1 - 2 Glasses of wine per week    Comment: occassionally   Drug use: No     Colonoscopy:  PAP:  Bone density:  Lipid panel:  No Known Allergies  Current Outpatient Medications  Medication Sig Dispense Refill   acyclovir (ZOVIRAX) 400 MG tablet Take 1 tablet (400 mg total) by mouth 2 (two) times daily. 60 tablet 5    ASPIRIN 81 PO Take 81 mg by mouth daily.     atorvastatin (LIPITOR) 20 MG tablet Take 20 mg by mouth daily.     busPIRone (BUSPAR) 30 MG tablet Take 30 mg by mouth 2 (two) times daily.     CALCIUM-VITAMIN D PO Take 1 tablet by mouth daily.     cyclobenzaprine (FLEXERIL) 10 MG tablet Take 1 tablet (10 mg total) by mouth at bedtime. 30 tablet 2   dexamethasone (DECADRON) 4 MG tablet Take 5 tabs (20 mg) weekly the day after daratumumab for 12 weeks. Take with breakfast.     lenalidomide (REVLIMID) 10 MG capsule Take 1 capsule (10 mg total) by mouth daily. Take for 14 days, then hold for 7 days. 14 capsule 0   lidocaine-prilocaine (EMLA) cream Apply 1 application topically as needed. 30 g 2   liraglutide (VICTOZA) 18 MG/3ML SOPN Inject 1.2 mg into the skin daily.     meloxicam (MOBIC) 15 MG tablet Take 15 mg by mouth daily.     metFORMIN (GLUCOPHAGE) 500 MG tablet Take 1,000 mg by mouth 2 (two) times daily with a meal.     methocarbamol (ROBAXIN) 500 MG tablet Take 1 tablet (500 mg total) by mouth every 6 (six) hours as needed for muscle spasms. 40 tablet 1   metoprolol succinate (TOPROL-XL) 50 MG 24 hr tablet Take 50 mg by mouth 2 (two) times daily. Take with or immediately following a meal.     Misc Natural Products (GLUCOSAMINE CHOND CMP TRIPLE) TABS Take 1 tablet by mouth daily.     morphine (MS CONTIN) 15 MG 12 hr tablet TAKE 2 TABLETS BY MOUTH EVERY 12 HOURS 120 tablet 0   Multiple Vitamin (MULTIVITAMIN WITH MINERALS) TABS tablet Take 1 tablet by mouth daily.     omeprazole (PRILOSEC) 20 MG capsule Take 40 mg by mouth daily. Pt taking differently 40 mg instead of 20.     ondansetron (ZOFRAN) 8 MG tablet Take 1 tablet (8 mg total) by mouth 2 (two) times daily as needed (Nausea or vomiting). 60 tablet 2   oxyCODONE-acetaminophen (PERCOCET/ROXICET) 5-325 MG tablet TAKE 1-2 TABLETS BY MOUTH EVERY 4 HOURS AS NEEDED FOR SEVERE PAIN 90 tablet 0   polyethylene glycol (MIRALAX / GLYCOLAX) 17 g packet  Take by mouth daily as needed for mild constipation.     prochlorperazine (COMPAZINE) 10 MG tablet Take 1 tablet (10 mg total) by mouth every 6 (six) hours as needed (Nausea or vomiting). 60 tablet 2   traMADol (ULTRAM) 50 MG tablet TAKE 1 TABLET BY MOUTH TWICE DAILY AS NEEDED FOR MODERATE PAIN 60 tablet 1   cefUROXime (CEFTIN) 500 MG tablet Take 500 mg by mouth 2 (two) times daily. (Patient not taking: Reported on 01/04/2021)     No current facility-administered medications for this visit.   Facility-Administered Medications Ordered in Other Visits  Medication Dose Route Frequency Provider Last Rate  Last Admin   sodium chloride flush (NS) 0.9 % injection 10 mL  10 mL Intravenous PRN Lloyd Huger, MD   10 mL at 11/23/20 0932    OBJECTIVE: Vitals:   01/04/21 0912  BP: 115/72  Pulse: 85  Resp: 16  Temp: 97.9 F (36.6 C)  SpO2: 100%     Body mass index is 25.95 kg/m.    ECOG FS:1 - Symptomatic but completely ambulatory  General: Well-developed, well-nourished, no acute distress. Eyes: Pink conjunctiva, anicteric sclera. Large ecchymosis surrounding left eye. HEENT: Normocephalic, moist mucous membranes. Lungs: No audible wheezing or coughing. Heart: Regular rate and rhythm. Abdomen: Soft, nontender, no obvious distention. Musculoskeletal: No edema, cyanosis, or clubbing. Neuro: Alert, answering all questions appropriately. Cranial nerves grossly intact. Skin: No rashes or petechiae noted. Psych: Normal affect.   LAB RESULTS:  Lab Results  Component Value Date   NA 139 01/04/2021   K 4.2 01/04/2021   CL 105 01/04/2021   CO2 25 01/04/2021   GLUCOSE 145 (H) 01/04/2021   BUN 12 01/04/2021   CREATININE 0.92 01/04/2021   CALCIUM 8.3 (L) 01/04/2021   PROT 5.9 (L) 01/04/2021   ALBUMIN 3.5 01/04/2021   AST 22 01/04/2021   ALT 18 01/04/2021   ALKPHOS 133 (H) 01/04/2021   BILITOT 0.6 01/04/2021   GFRNONAA >60 01/04/2021    Lab Results  Component Value Date   WBC 3.9  (L) 01/04/2021   NEUTROABS 2.8 01/04/2021   HGB 10.2 (L) 01/04/2021   HCT 31.4 (L) 01/04/2021   MCV 97.8 01/04/2021   PLT 208 01/04/2021     STUDIES: DG Chest 2 View  Result Date: 12/31/2020 CLINICAL DATA:  Syncope. EXAM: CHEST - 2 VIEW COMPARISON:  None. FINDINGS: A right-sided venous Port-A-Cath is seen with its distal tip noted at the junction of the superior vena cava and right atrium. Mild, diffuse, chronic appearing increased lung markings are noted. There is no evidence of acute infiltrate, pleural effusion or pneumothorax. The heart size and mediastinal contours are within normal limits. Evidence of prior vertebroplasty is seen at the level of T12. IMPRESSION: No evidence of acute or active cardiopulmonary disease. Electronically Signed   By: Virgina Norfolk M.D.   On: 12/31/2020 16:02   CT Head Wo Contrast  Result Date: 12/31/2020 CLINICAL DATA:  History of multiple myeloma with recent syncopal episode and fall, headaches and neck pain, initial encounter EXAM: CT HEAD WITHOUT CONTRAST CT CERVICAL SPINE WITHOUT CONTRAST TECHNIQUE: Multidetector CT imaging of the head and cervical spine was performed following the standard protocol without intravenous contrast. Multiplanar CT image reconstructions of the cervical spine were also generated. COMPARISON:  None. FINDINGS: CT HEAD FINDINGS Brain: No evidence of acute infarction, hemorrhage, hydrocephalus, extra-axial collection or mass lesion/mass effect. Vascular: No hyperdense vessel or unexpected calcification. Skull: Scattered lucencies are noted within the skull consistent with the given clinical history of multiple myeloma. Sinuses/Orbits: No acute finding. Other: None CT CERVICAL SPINE FINDINGS Alignment: Mild anterolisthesis of C3 on C4, C4 on C5 and C5 on C6 of a degenerative nature. Skull base and vertebrae: 7 cervical segments are well visualized. No compression deformities are seen. Mild osteophytic changes are noted. Degenerative  facet hypertrophic changes are seen. Scattered mild lucencies are noted similar to that seen in the skull consistent with the given clinical history. No acute fracture or acute facet abnormality is noted. Soft tissues and spinal canal: Right-sided chest wall port is noted. Surrounding soft tissues are within normal limits. Upper  chest: Visualized lung apices are unremarkable. Other: None IMPRESSION: CT of the head: No acute intracranial abnormality noted. Multiple skull lucencies are noted consistent with the given clinical history of multiple myeloma. CT of the cervical spine: Degenerative change without acute abnormality. Mild lucency is consistent with the given clinical history of multiple myeloma. Electronically Signed   By: Inez Catalina M.D.   On: 12/31/2020 22:02   CT Angio Chest Pulmonary Embolism (PE) W or WO Contrast  Result Date: 01/01/2021 CLINICAL DATA:  Shortness of breath and recent syncopal episode, initial encounter EXAM: CT ANGIOGRAPHY CHEST WITH CONTRAST TECHNIQUE: Multidetector CT imaging of the chest was performed using the standard protocol during bolus administration of intravenous contrast. Multiplanar CT image reconstructions and MIPs were obtained to evaluate the vascular anatomy. CONTRAST:  27m OMNIPAQUE IOHEXOL 350 MG/ML SOLN COMPARISON:  08/03/2020 FINDINGS: Cardiovascular: Thoracic aorta is well visualized without aneurysmal dilatation or focal dissection. Pulmonary artery is well visualized without focal filling defect to suggest pulmonary embolism. No coronary calcifications or cardiac enlargement is seen. Right-sided chest wall port is noted in satisfactory position. Mediastinum/Nodes: Thoracic inlet is within normal limits. No sizable hilar or mediastinal adenopathy is noted. The esophagus as visualized is within normal limits. Lungs/Pleura: Lungs are well aerated bilaterally. No focal infiltrate or sizable effusion is seen. Upper Abdomen: Visualized upper abdomen is  unremarkable. Musculoskeletal: Bilateral breast implants are noted. Multiple rib fractures with varying degrees of callus formation are noted consistent with pathologic fractures from the patient's known multiple myeloma. These are noted bilaterally. Changes of prior vertebral augmentation are seen at T12. Areas of mixed lytic and sclerotic changes are noted consistent with the given clinical history. Mild vertebral body height loss is noted at T2, T4, T5 and T9 consistent with compression deformities. These have progressed slightly in the interval from the prior MRI from 10/19/2020 Review of the MIP images confirms the above findings. IMPRESSION: No evidence of pulmonary emboli. No focal infiltrate is seen. Changes of multiple myeloma with multiple pathologic fractures with varying degrees of callus formation. Additionally multiple thoracic compression deformities are seen which have progressed from prior exam from 10/19/2020. Electronically Signed   By: MInez CatalinaM.D.   On: 01/01/2021 00:31   CT Cervical Spine Wo Contrast  Result Date: 12/31/2020 CLINICAL DATA:  History of multiple myeloma with recent syncopal episode and fall, headaches and neck pain, initial encounter EXAM: CT HEAD WITHOUT CONTRAST CT CERVICAL SPINE WITHOUT CONTRAST TECHNIQUE: Multidetector CT imaging of the head and cervical spine was performed following the standard protocol without intravenous contrast. Multiplanar CT image reconstructions of the cervical spine were also generated. COMPARISON:  None. FINDINGS: CT HEAD FINDINGS Brain: No evidence of acute infarction, hemorrhage, hydrocephalus, extra-axial collection or mass lesion/mass effect. Vascular: No hyperdense vessel or unexpected calcification. Skull: Scattered lucencies are noted within the skull consistent with the given clinical history of multiple myeloma. Sinuses/Orbits: No acute finding. Other: None CT CERVICAL SPINE FINDINGS Alignment: Mild anterolisthesis of C3 on C4, C4  on C5 and C5 on C6 of a degenerative nature. Skull base and vertebrae: 7 cervical segments are well visualized. No compression deformities are seen. Mild osteophytic changes are noted. Degenerative facet hypertrophic changes are seen. Scattered mild lucencies are noted similar to that seen in the skull consistent with the given clinical history. No acute fracture or acute facet abnormality is noted. Soft tissues and spinal canal: Right-sided chest wall port is noted. Surrounding soft tissues are within normal limits. Upper chest: Visualized lung  apices are unremarkable. Other: None IMPRESSION: CT of the head: No acute intracranial abnormality noted. Multiple skull lucencies are noted consistent with the given clinical history of multiple myeloma. CT of the cervical spine: Degenerative change without acute abnormality. Mild lucency is consistent with the given clinical history of multiple myeloma. Electronically Signed   By: Inez Catalina M.D.   On: 12/31/2020 22:02   ECHOCARDIOGRAM COMPLETE  Result Date: 01/02/2021    ECHOCARDIOGRAM REPORT   Patient Name:   ELIKA [facility] Date of Exam: 01/01/2021 Medical Rec #:  [id number]     Height:       62.0 in Accession #:    [id number]    Weight:       140.0 lb Date of Birth:  1955-03-16     BSA:          1.643 m Patient Age:    29 years      BP:           124/77 mmHg Patient Gender: F             HR:           87 bpm. Exam Location:  [facility] Procedure: 2D Echo Indications:     Syncope R55  History:         Patient has no prior history of Echocardiogram examinations.  Sonographer:     Kathlen Brunswick RDCS Referring Phys:  [id number] Sharen Hones Diagnosing Phys: Kate Sable MD IMPRESSIONS  1. Left ventricular ejection fraction, by estimation, is 55 to 60%. The left ventricle has normal function. The left ventricle has no regional wall motion abnormalities. Left ventricular diastolic parameters were normal.  2. Right ventricular systolic function is normal. The right  ventricular size is normal.  3. The mitral valve is normal in structure. No evidence of mitral valve regurgitation.  4. The aortic valve is tricuspid. Aortic valve regurgitation is not visualized. FINDINGS  Left Ventricle: Left ventricular ejection fraction, by estimation, is 55 to 60%. The left ventricle has normal function. The left ventricle has no regional wall motion abnormalities. The left ventricular internal cavity size was normal in size. There is  no left ventricular hypertrophy. Left ventricular diastolic parameters were normal. Right Ventricle: The right ventricular size is normal. No increase in right ventricular wall thickness. Right ventricular systolic function is normal. Left Atrium: Left atrial size was normal in size. Right Atrium: Right atrial size was normal in size. Pericardium: There is no evidence of pericardial effusion. Mitral Valve: The mitral valve is normal in structure. No evidence of mitral valve regurgitation. Tricuspid Valve: The tricuspid valve is normal in structure. Tricuspid valve regurgitation is not demonstrated. Aortic Valve: The aortic valve is tricuspid. Aortic valve regurgitation is not visualized. Aortic valve peak gradient measures 7.5 mmHg. Pulmonic Valve: The pulmonic valve was normal in structure. Pulmonic valve regurgitation is not visualized. Aorta: The aortic root and ascending aorta are structurally normal, with no evidence of dilitation. Venous: The inferior vena cava was not well visualized. IAS/Shunts: No atrial level shunt detected by color flow Doppler.  LEFT VENTRICLE PLAX 2D LVIDd:         3.51 cm   Diastology LVIDs:         2.42 cm   LV e' medial:    7.94 cm/s LV PW:         0.92 cm   LV E/e' medial:  12.4 LV IVS:        0.78 cm   LV  e' lateral:   9.46 cm/s LVOT diam:     1.70 cm   LV E/e' lateral: 10.4 LV SV:         31 LV SV Index:   19 LVOT Area:     2.27 cm  RIGHT VENTRICLE RV Basal diam:  2.80 cm RV S prime:     14.30 cm/s LEFT ATRIUM           Index         RIGHT ATRIUM           Index LA diam:      3.40 cm 2.07 cm/m   RA Area:     12.10 cm LA Vol (A2C): 10.7 ml 6.51 ml/m   RA Volume:   27.40 ml  16.68 ml/m LA Vol (A4C): 25.0 ml 15.22 ml/m  AORTIC VALVE                 PULMONIC VALVE AV Area (Vmax): 1.22 cm     PV Vmax:       0.98 m/s AV Vmax:        137.00 cm/s  PV Peak grad:  3.8 mmHg AV Peak Grad:   7.5 mmHg LVOT Vmax:      73.80 cm/s LVOT Vmean:     48.400 cm/s LVOT VTI:       0.138 m  AORTA Ao Root diam: 2.40 cm Ao Asc diam:  2.60 cm MITRAL VALVE               TRICUSPID VALVE MV Area (PHT): 4.86 cm    TV Peak grad:   23.8 mmHg MV Decel Time: 156 msec    TV Vmax:        2.44 m/s MV E velocity: 98.50 cm/s MV A velocity: 85.70 cm/s  SHUNTS MV E/A ratio:  1.15        Systemic VTI:  0.14 m                            Systemic Diam: 1.70 cm Kate Sable MD Electronically signed by Kate Sable MD Signature Date/Time: 01/02/2021/1:23:33 PM    Final      ASSESSMENT:  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  PLAN:      1.  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  Initially her lambda free light chains were elevated at 432.1, but now are within normal limits at 12.1.  Immunoglobulins and SPEP are normal.  Bone marrow biopsy completed on August 26, 2020 revealed 25% plasma cells along with the deletion of 1p which is considered poor prognosis.  PET scan results from September 06, 2020 reviewed independently with 3 hypermetabolic lesions in right C5-6 facets, right iliac crest lesion, and L3 vertebral lesion.  After lengthy discussion with the patient, she agreed to pursue chemotherapy with daratumumab, Velcade, Revlimid, and dexamethasone followed by autologous bone marrow transplant.  Patient will receive weekly daratumumab for the first 3-4 cycles along with Velcade on days 1, 4, 8, and 11.  She will receive Revlimid on days 1 through 14 of a 21-day cycle.  Plan to do 4 cycles and then refer back  to [facility] for autologous bone cell transplant.  Given patient's persistent thrombocytopenia, will discontinue day 15 which is daratumumab only for all subsequent cycles.  Proceed with cycle 4, day 1 of treatment today.  Return to clinic  Friday for Velcade only and then in 1 week for further evaluation and consideration of cycle 4, day 8.  Will repeat bone marrow biopsy at the conclusion of cycle 4 which is scheduled for January 24, 2021 2.  Elevated liver enzymes: Resolved.   3.  Pain: Improved.  Patient has now completed XRT.  She had kyphoplasty x2.  Better controlled on 30 mg MS Contin every 12 hours.  Patient continues to take Percocet as needed sparingly.  4.  Hypercalcemia: Resolved.  Patient's calcium is 8.3 today.  Proceed with Zometa.   5.  Renal insufficiency: Resolved.   6.  Leukopenia: Mild, monitor. 7.  Anemia: Patient received 1 unit of packed red blood cells in the ER recently.  Hemoglobin has appropriately improved to 10.2. 8.  Thrombocytopenia: Resolved.  Discontinue day 15 daratumumab with all subsequent cycles. 9.  Syncopal episode/shortness of breath/peripheral edema: Possibly underlying cardiac issues.  Patient's most recent echo on January 01, 2021 reported an EF of 55 to 60%.  Patient was given a referral to cardiology for further evaluation.  Patient expressed understanding and was in agreement with this plan. She also understands that She can call clinic at any time with any questions, concerns, or complaints.   Cancer Staging Lambda light chain myeloma ([place]) Staging form: Plasma Cell Myeloma and Plasma Cell Disorders, AJCC 8th Edition - Clinical stage from 09/16/2020: RISS Stage I (Beta-2-microglobulin (mg/L): 2.5, Albumin (g/dL): 4.9, ISS: Stage I, High-risk cytogenetics: Absent, LDH: Normal) - Signed by Lloyd Huger, MD on 09/16/2020 Beta 2 microglobulin range (mg/L): Less than 3.5 Albumin range (g/dL): Greater than or equal to 3.5 Cytogenetics: 1p  deletion  Lloyd Huger, MD   01/04/2021 2:28 PM

## 2020-12-30 NOTE — Patient Instructions (Addendum)
Cryotherapy Aftercare  Wash gently with soap and water everyday.   Apply Vaseline and Band-Aid daily until healed.  Prior to procedure, discussed risks of blister formation, small wound, skin dyspigmentation, or rare scar following cryotherapy. Recommend Vaseline ointment to treated areas while healing.    If you have any questions or concerns for your doctor, please call our main line at [phone] and press option 4 to reach your doctor's medical assistant. If no one answers, please leave a voicemail as directed and we will return your call as soon as possible. Messages left after 4 pm will be answered the following business day.   You may also send us a message via MyChart. We typically respond to MyChart messages within 1-2 business days.  For prescription refills, please ask your pharmacy to contact our office. Our fax number is [phone].  If you have an urgent issue when the clinic is closed that cannot wait until the next business day, you can page your doctor at the number below.    Please note that while we do our best to be available for urgent issues outside of office hours, we are not available 24/7.   If you have an urgent issue and are unable to reach us, you may choose to seek medical care at your doctor's office, retail clinic, urgent care center, or emergency room.  If you have a medical emergency, please immediately call 911 or go to the emergency department.  Pager Numbers  - Dr. Kowalski: [phone]  - Dr. Moye: [phone]  - Dr. Stewart: [phone]  In the event of inclement weather, please call our main line at [phone] for an update on the status of any delays or closures.  Dermatology Medication Tips: Please keep the boxes that topical medications come in in order to help keep track of the instructions about where and how to use these. Pharmacies typically print the medication instructions only on the boxes and not directly on the medication  tubes.   If your medication is too expensive, please contact our office at [phone] option 4 or send us a message through MyChart.   We are unable to tell what your co-pay for medications will be in advance as this is different depending on your insurance coverage. However, we may be able to find a substitute medication at lower cost or fill out paperwork to get insurance to cover a needed medication.   If a prior authorization is required to get your medication covered by your insurance company, please allow us 1-2 business days to complete this process.  Drug prices often vary depending on where the prescription is filled and some pharmacies may offer cheaper prices.  The website www.goodrx.com contains coupons for medications through different pharmacies. The prices here do not account for what the cost may be with help from insurance (it may be cheaper with your insurance), but the website can give you the price if you did not use any insurance.  - You can print the associated coupon and take it with your prescription to the pharmacy.  - You may also stop by our office during regular business hours and pick up a GoodRx coupon card.  - If you need your prescription sent electronically to a different pharmacy, notify our office through [facility] MyChart or by phone at [phone] option 4.  

## 2020-12-30 NOTE — Telephone Encounter (Addendum)
Patient is going thru the work up at [facility] for autologous stem cell support and is wanting to have the bone marrow biopsy local to her home.  Spoke to [place] at [facility] and she suggest having the bone marrow biopsy scheduled on 01/24/21.    Order entered and schedule request faxed to specialty scheduling.  Will call Kennyth Lose at [facility] when bone marrow biopsy is scheduled [phone]).

## 2020-12-31 ENCOUNTER — Emergency Department: Payer: BC Managed Care – PPO

## 2020-12-31 ENCOUNTER — Other Ambulatory Visit: Payer: Self-pay

## 2020-12-31 ENCOUNTER — Inpatient Hospital Stay (HOSPITAL_BASED_OUTPATIENT_CLINIC_OR_DEPARTMENT_OTHER): Payer: BC Managed Care – PPO | Admitting: Nurse Practitioner

## 2020-12-31 ENCOUNTER — Inpatient Hospital Stay: Payer: BC Managed Care – PPO | Attending: Oncology

## 2020-12-31 ENCOUNTER — Inpatient Hospital Stay: Payer: BC Managed Care – PPO

## 2020-12-31 ENCOUNTER — Encounter: Payer: Self-pay | Admitting: Emergency Medicine

## 2020-12-31 ENCOUNTER — Telehealth: Payer: Self-pay | Admitting: *Deleted

## 2020-12-31 ENCOUNTER — Observation Stay
Admission: EM | Admit: 2020-12-31 | Discharge: 2021-01-01 | Disposition: A | Discharge status: Home / Self Care | Payer: BC Managed Care – PPO | Attending: Internal Medicine | Admitting: Internal Medicine

## 2020-12-31 VITALS — BP 174/97 | HR 90 | Resp 18 | Wt 147.9 lb

## 2020-12-31 DIAGNOSIS — C9000 Multiple myeloma not having achieved remission: Secondary | ICD-10-CM | POA: Diagnosis not present

## 2020-12-31 DIAGNOSIS — D696 Thrombocytopenia, unspecified: Secondary | ICD-10-CM | POA: Insufficient documentation

## 2020-12-31 DIAGNOSIS — Z20822 Contact with and (suspected) exposure to covid-19: Secondary | ICD-10-CM | POA: Diagnosis not present

## 2020-12-31 DIAGNOSIS — R55 Syncope and collapse: Principal | ICD-10-CM | POA: Insufficient documentation

## 2020-12-31 DIAGNOSIS — Z79899 Other long term (current) drug therapy: Secondary | ICD-10-CM | POA: Insufficient documentation

## 2020-12-31 DIAGNOSIS — E119 Type 2 diabetes mellitus without complications: Secondary | ICD-10-CM | POA: Insufficient documentation

## 2020-12-31 DIAGNOSIS — R0602 Shortness of breath: Secondary | ICD-10-CM | POA: Diagnosis not present

## 2020-12-31 DIAGNOSIS — Z7984 Long term (current) use of oral hypoglycemic drugs: Secondary | ICD-10-CM | POA: Diagnosis not present

## 2020-12-31 DIAGNOSIS — E785 Hyperlipidemia, unspecified: Secondary | ICD-10-CM

## 2020-12-31 DIAGNOSIS — Z87891 Personal history of nicotine dependence: Secondary | ICD-10-CM | POA: Insufficient documentation

## 2020-12-31 DIAGNOSIS — Z7982 Long term (current) use of aspirin: Secondary | ICD-10-CM | POA: Diagnosis not present

## 2020-12-31 DIAGNOSIS — Z85828 Personal history of other malignant neoplasm of skin: Secondary | ICD-10-CM | POA: Insufficient documentation

## 2020-12-31 DIAGNOSIS — Z9221 Personal history of antineoplastic chemotherapy: Secondary | ICD-10-CM | POA: Insufficient documentation

## 2020-12-31 DIAGNOSIS — D649 Anemia, unspecified: Secondary | ICD-10-CM | POA: Insufficient documentation

## 2020-12-31 DIAGNOSIS — Z5112 Encounter for antineoplastic immunotherapy: Secondary | ICD-10-CM | POA: Insufficient documentation

## 2020-12-31 LAB — CBC WITH DIFFERENTIAL/PLATELET
Abs Immature Granulocytes: 0.02 10*3/uL (ref 0.00–0.07)
Basophils Absolute: 0 10*3/uL (ref 0.0–0.1)
Basophils Relative: 0 %
Eosinophils Absolute: 0.2 10*3/uL (ref 0.0–0.5)
Eosinophils Relative: 5 %
HCT: 25.7 % — ABNORMAL LOW (ref 36.0–46.0)
Hemoglobin: 8.2 g/dL — ABNORMAL LOW (ref 12.0–15.0)
Immature Granulocytes: 0 %
Lymphocytes Relative: 9 %
Lymphs Abs: 0.5 10*3/uL — ABNORMAL LOW (ref 0.7–4.0)
MCH: 32 pg (ref 26.0–34.0)
MCHC: 31.9 g/dL (ref 30.0–36.0)
MCV: 100.4 fL — ABNORMAL HIGH (ref 80.0–100.0)
Monocytes Absolute: 0.8 10*3/uL (ref 0.1–1.0)
Monocytes Relative: 16 %
Neutro Abs: 3.5 10*3/uL (ref 1.7–7.7)
Neutrophils Relative %: 70 %
Platelets: 121 10*3/uL — ABNORMAL LOW (ref 150–400)
RBC: 2.56 MIL/uL — ABNORMAL LOW (ref 3.87–5.11)
RDW: 15.5 % (ref 11.5–15.5)
WBC: 5 10*3/uL (ref 4.0–10.5)
nRBC: 0 % (ref 0.0–0.2)

## 2020-12-31 LAB — RESP PANEL BY RT-PCR (FLU A&B, COVID) ARPGX2
Influenza A by PCR: NEGATIVE
Influenza B by PCR: NEGATIVE
SARS Coronavirus 2 by RT PCR: NEGATIVE

## 2020-12-31 LAB — BASIC METABOLIC PANEL
Anion gap: 10 (ref 5–15)
BUN: 21 mg/dL (ref 8–23)
CO2: 22 mmol/L (ref 22–32)
Calcium: 8.5 mg/dL — ABNORMAL LOW (ref 8.9–10.3)
Chloride: 106 mmol/L (ref 98–111)
Creatinine, Ser: 1.19 mg/dL — ABNORMAL HIGH (ref 0.44–1.00)
GFR, Estimated: 51 mL/min — ABNORMAL LOW (ref >60)
Glucose, Bld: 170 mg/dL — ABNORMAL HIGH (ref 70–99)
Potassium: 4.1 mmol/L (ref 3.5–5.1)
Sodium: 138 mmol/L (ref 135–145)

## 2020-12-31 LAB — COMPREHENSIVE METABOLIC PANEL
ALT: 22 U/L (ref 0–44)
AST: 28 U/L (ref 15–41)
Albumin: 3.7 g/dL (ref 3.5–5.0)
Alkaline Phosphatase: 122 U/L (ref 38–126)
Anion gap: 10 (ref 5–15)
BUN: 21 mg/dL (ref 8–23)
CO2: 23 mmol/L (ref 22–32)
Calcium: 8.2 mg/dL — ABNORMAL LOW (ref 8.9–10.3)
Chloride: 105 mmol/L (ref 98–111)
Creatinine, Ser: 1.33 mg/dL — ABNORMAL HIGH (ref 0.44–1.00)
GFR, Estimated: 45 mL/min — ABNORMAL LOW (ref >60)
Glucose, Bld: 129 mg/dL — ABNORMAL HIGH (ref 70–99)
Potassium: 3.9 mmol/L (ref 3.5–5.1)
Sodium: 138 mmol/L (ref 135–145)
Total Bilirubin: 0.2 mg/dL — ABNORMAL LOW (ref 0.3–1.2)
Total Protein: 6.2 g/dL — ABNORMAL LOW (ref 6.5–8.1)

## 2020-12-31 LAB — CBC
HCT: 24.7 % — ABNORMAL LOW (ref 36.0–46.0)
Hemoglobin: 7.9 g/dL — ABNORMAL LOW (ref 12.0–15.0)
MCH: 32.2 pg (ref 26.0–34.0)
MCHC: 32 g/dL (ref 30.0–36.0)
MCV: 100.8 fL — ABNORMAL HIGH (ref 80.0–100.0)
Platelets: 119 10*3/uL — ABNORMAL LOW (ref 150–400)
RBC: 2.45 MIL/uL — ABNORMAL LOW (ref 3.87–5.11)
RDW: 15.8 % — ABNORMAL HIGH (ref 11.5–15.5)
WBC: 4.4 10*3/uL (ref 4.0–10.5)
nRBC: 0 % (ref 0.0–0.2)

## 2020-12-31 LAB — SAMPLE TO BLOOD BANK

## 2020-12-31 LAB — TROPONIN I (HIGH SENSITIVITY): Troponin I (High Sensitivity): 18 ng/L — ABNORMAL HIGH (ref <18)

## 2020-12-31 MED ORDER — ADULT MULTIVITAMIN W/MINERALS CH
1.0000 | ORAL_TABLET | Freq: Every day | ORAL | Status: DC
  Administered 2021-01-01: 1 via ORAL
  Filled 2020-12-31: qty 1

## 2020-12-31 MED ORDER — SODIUM CHLORIDE 0.9 % IV SOLN: 10.0000 mL/h | Freq: Once | INTRAVENOUS | Status: DC

## 2020-12-31 MED ORDER — METHOCARBAMOL 500 MG PO TABS
500.0000 mg | ORAL_TABLET | Freq: Four times a day (QID) | ORAL | Status: DC | PRN
  Filled 2020-12-31: qty 1

## 2020-12-31 MED ORDER — METOPROLOL SUCCINATE ER 50 MG PO TB24
50.0000 mg | ORAL_TABLET | Freq: Two times a day (BID) | ORAL | Status: DC
  Administered 2021-01-01: 50 mg via ORAL
  Filled 2020-12-31: qty 1

## 2020-12-31 MED ORDER — ONDANSETRON HCL 4 MG/2ML IJ SOLN: 4.0000 mg | Freq: Four times a day (QID) | INTRAMUSCULAR | Status: DC | PRN

## 2020-12-31 MED ORDER — ASPIRIN 81 MG PO CHEW
81.0000 mg | CHEWABLE_TABLET | Freq: Every day | ORAL | Status: DC
  Administered 2021-01-01: 81 mg via ORAL
  Filled 2020-12-31: qty 1

## 2020-12-31 MED ORDER — ACETAMINOPHEN 325 MG PO TABS: 650.0000 mg | ORAL_TABLET | Freq: Four times a day (QID) | ORAL | Status: DC | PRN

## 2020-12-31 MED ORDER — OYSTER SHELL CALCIUM/D3 500-5 MG-MCG PO TABS
1.0000 | ORAL_TABLET | Freq: Every day | ORAL | Status: DC
  Administered 2021-01-01: 1 via ORAL
  Filled 2020-12-31: qty 1

## 2020-12-31 MED ORDER — ATORVASTATIN CALCIUM 20 MG PO TABS
20.0000 mg | ORAL_TABLET | Freq: Every day | ORAL | Status: DC
  Administered 2021-01-01: 20 mg via ORAL
  Filled 2020-12-31: qty 1

## 2020-12-31 MED ORDER — ONDANSETRON HCL 4 MG PO TABS
4.0000 mg | ORAL_TABLET | Freq: Four times a day (QID) | ORAL | Status: DC | PRN
  Administered 2021-01-01: 4 mg via ORAL
  Filled 2020-12-31: qty 1

## 2020-12-31 MED ORDER — CYCLOBENZAPRINE HCL 10 MG PO TABS
10.0000 mg | ORAL_TABLET | Freq: Every day | ORAL | Status: DC
  Administered 2021-01-01: 10 mg via ORAL
  Filled 2020-12-31: qty 1

## 2020-12-31 MED ORDER — ENOXAPARIN SODIUM 40 MG/0.4ML IJ SOSY
40.0000 mg | PREFILLED_SYRINGE | INTRAMUSCULAR | Status: DC
  Administered 2021-01-01: 40 mg via SUBCUTANEOUS
  Filled 2020-12-31: qty 0.4

## 2020-12-31 MED ORDER — BUSPIRONE HCL 5 MG PO TABS
30.0000 mg | ORAL_TABLET | Freq: Two times a day (BID) | ORAL | Status: DC
  Administered 2021-01-01 (×2): 30 mg via ORAL
  Filled 2020-12-31 (×2): qty 6

## 2020-12-31 MED ORDER — SODIUM CHLORIDE 0.9% FLUSH
10.0000 mL | Freq: Once | INTRAVENOUS | Status: AC
  Administered 2020-12-31: 10 mL via INTRAVENOUS
  Filled 2020-12-31: qty 10

## 2020-12-31 MED ORDER — TRAMADOL HCL 50 MG PO TABS: 50.0000 mg | ORAL_TABLET | Freq: Two times a day (BID) | ORAL | Status: DC | PRN

## 2020-12-31 MED ORDER — ACETAMINOPHEN 325 MG RE SUPP: 650.0000 mg | Freq: Four times a day (QID) | RECTAL | Status: DC | PRN

## 2020-12-31 MED ORDER — PANTOPRAZOLE SODIUM 40 MG PO TBEC
40.0000 mg | DELAYED_RELEASE_TABLET | Freq: Every day | ORAL | Status: DC
  Administered 2021-01-01: 40 mg via ORAL
  Filled 2020-12-31: qty 1

## 2020-12-31 MED ORDER — LIRAGLUTIDE 18 MG/3ML ~~LOC~~ SOPN: 1.2000 mg | PEN_INJECTOR | Freq: Every day | SUBCUTANEOUS | Status: DC

## 2020-12-31 MED ORDER — OXYCODONE-ACETAMINOPHEN 5-325 MG PO TABS: 1.0000 | ORAL_TABLET | ORAL | Status: DC | PRN

## 2020-12-31 MED ORDER — MELOXICAM 7.5 MG PO TABS
15.0000 mg | ORAL_TABLET | Freq: Every day | ORAL | Status: DC
  Administered 2021-01-01: 15 mg via ORAL
  Filled 2020-12-31 (×2): qty 2

## 2020-12-31 MED ORDER — SODIUM CHLORIDE 0.9 % IV SOLN: INTRAVENOUS | Status: DC

## 2020-12-31 MED ORDER — LIDOCAINE-PRILOCAINE 2.5-2.5 % EX CREA: 1.0000 "application " | TOPICAL_CREAM | CUTANEOUS | Status: DC | PRN

## 2020-12-31 MED ORDER — PROCHLORPERAZINE MALEATE 10 MG PO TABS: 10.0000 mg | ORAL_TABLET | Freq: Four times a day (QID) | ORAL | Status: DC | PRN

## 2020-12-31 MED ORDER — TRAZODONE HCL 50 MG PO TABS: 25.0000 mg | ORAL_TABLET | Freq: Every evening | ORAL | Status: DC | PRN

## 2020-12-31 MED ORDER — LENALIDOMIDE 10 MG PO CAPS: 10.0000 mg | ORAL_CAPSULE | Freq: Every day | ORAL | Status: DC

## 2020-12-31 MED ORDER — ONDANSETRON HCL 4 MG PO TABS: 8.0000 mg | ORAL_TABLET | Freq: Two times a day (BID) | ORAL | Status: DC | PRN

## 2020-12-31 MED ORDER — MORPHINE SULFATE ER 30 MG PO TBCR
30.0000 mg | EXTENDED_RELEASE_TABLET | Freq: Two times a day (BID) | ORAL | Status: DC
  Administered 2021-01-01 (×2): 30 mg via ORAL
  Filled 2020-12-31 (×2): qty 1

## 2020-12-31 MED ORDER — POLYETHYLENE GLYCOL 3350 17 G PO PACK: 17.0000 g | PACK | Freq: Every day | ORAL | Status: DC | PRN

## 2020-12-31 MED ORDER — MAGNESIUM HYDROXIDE 400 MG/5ML PO SUSP: 30.0000 mL | Freq: Every day | ORAL | Status: DC | PRN

## 2020-12-31 MED FILL — CEFUROXIME 500MG TAB: 5 days supply | Qty: 10

## 2020-12-31 NOTE — Progress Notes (Signed)
Pt weighed and was walking down hallway when her breathing became very heavy and labored. Pt assisted to sitting position to ease breathing. VSS checked BP 174/97 HR 90 Resp 23. SPO2 100%. Reports falling off of commode last night after becoming very dyspneic, now has a bruised left eye from the fall. Ankles have edema. Pt states dyspnea started about 2 weeks ago and has progressed to sudden labored breathing with any exertion.

## 2020-12-31 NOTE — Telephone Encounter (Signed)
Patient called reporting that she continues to have shortness of breath and that she got so shortness of breath when she got up to bathroom in middle of night that she passed out briefly whilst on the toilet and her husband had to help her back to bed. She is asking if she can be evaluated. Please advise

## 2020-12-31 NOTE — H&P (Addendum)
[place]   PATIENT NAME: Hannah Mcdonald    MR#:  [id number]  DATE OF BIRTH:  05/24/1955  DATE OF ADMISSION:  12/31/2020  PRIMARY CARE PHYSICIAN: Albina Billet, MD   Patient is coming from: Home  REQUESTING/REFERRING PHYSICIAN: Conni Slipper, MD  CHIEF COMPLAINT:   Chief Complaint  Patient presents with   Loss of Consciousness   Shortness of Breath    HISTORY OF PRESENT ILLNESS:  Hannah Mcdonald is a 65 y.o. Caucasian female with medical history significant for multiple myeloma, type 2 diabetes mellitus, dyslipidemia, GERD and anxiety, who presented to the emergency room with acute onset of dyspnea when she was getting up.  She had 1 episode of syncope while sitting on the toilet preceded by dyspnea then she fell and hit her had with subsequent left periorbital contusion around 4:30 AM today.    She was seen today at the cancer center and sent here and while getting up and walking to the exam room she felt lightheaded and dizzy and had a presyncopal event when she sat down to avoid falling.  She admitted to nausea without vomiting.  No chest pain or palpitations.  She has been on chemotherapy with Revlimid 9 for the last 9weeks.  She is currently on her week off.  She denies any cough or wheezing hemoptysis.  No bleeding diathesis.  She denies any melena or bright red bleeding per rectum..  ED Course: Upon presentation to the ER blood pressure was 174/97 with otherwise normal vital signs.  Labs revealed blood glucose of 170.  High-sensitivity troponin I was 18 and CBC showed anemia with hemoglobin of 7.9 hematocrit 24.7 with an MCV of 100.8.  Respiratory panel is currently pending. EKG as reviewed by me : Showed normal sinus rhythm with rate of 88. Imaging: 2 view chest x-ray showed no acute cardiopulmonary disease. Noncontrasted head CT scan revealed no acute intracranial abnormalities.  There were multiple skeletal lucencies consistent with her history of multiple myeloma.   C-spine CT showed degenerative changes without acute abnormality and mild lucency consistent with multiple myeloma.  The patient was typed and crossmatched and ordered 2 units of packed red blood cells for symptomatic anemia.  She will be admitted to an observation medical monitored bed for further evaluation and management. PAST MEDICAL HISTORY:   Past Medical History:  Diagnosis Date   Anxiety    Diabetes mellitus without complication ([place]) 4301   GERD (gastroesophageal reflux disease)    Hyperlipidemia    Personal history of colonic polyps    Squamous cell carcinoma of skin 11/25/2013   Left chest. WD SCC with superficial infiltration.   Tachycardia   -Multiple myeloma  PAST SURGICAL HISTORY:   Past Surgical History:  Procedure Laterality Date   AUGMENTATION MAMMAPLASTY Bilateral 4840   silicone and replacement in 2001   [place], [place], 1998,2003, 2008, 2013   COLONOSCOPY WITH PROPOFOL N/A 08/16/2016   Procedure: COLONOSCOPY WITH PROPOFOL;  Surgeon: Robert Bellow, MD;  Location: [facility] ENDOSCOPY;  Service: Endoscopy;  Laterality: N/A;   DILATION AND CURETTAGE OF UTERUS     ENDOMETRIAL ABLATION  12/1991   ganglion cyst removal      IR IMAGING GUIDED PORT INSERTION  10/27/2020   KYPHOPLASTY N/A 10/28/2020   Procedure: T12 and L3 KYPHOPLASTY;  Surgeon: Hessie Knows, MD;  Location: [facility] ORS;  Service: Orthopedics;  Laterality: N/A;  TONSILLECTOMY     WRIST SURGERY Right 05/1995    SOCIAL HISTORY:   Social History   Tobacco Use   Smoking status: Former    Packs/day: 1.00    Years: 4.00    Pack years: 4.00    Types: Cigarettes    Quit date: 1982    Years since quitting: 40.8   Smokeless tobacco: Never  Substance Use Topics   Alcohol use: Yes    Alcohol/week: 1.0 - 2.0 standard drink    Types: 1 - 2 Glasses of wine per week    Comment: occassionally    FAMILY HISTORY:   Family History   Problem Relation Age of Onset   Colon polyps Sister    Colon cancer Father 28   Diabetes Mother    Breast cancer Neg Hx     DRUG ALLERGIES:  No Known Allergies  REVIEW OF SYSTEMS:   ROS As per history of present illness. All pertinent systems were reviewed above. Constitutional, HEENT, cardiovascular, respiratory, GI, GU, musculoskeletal, neuro, psychiatric, endocrine, integumentary and hematologic systems were reviewed and are otherwise negative/unremarkable except for positive findings mentioned above in the HPI.   MEDICATIONS AT HOME:   Prior to Admission medications   Medication Sig Start Date End Date Taking? Authorizing Provider  acyclovir (ZOVIRAX) 400 MG tablet Take 1 tablet (400 mg total) by mouth 2 (two) times daily. 10/19/20   Lloyd Huger, MD  ASPIRIN 81 PO Take 81 mg by mouth daily. 11/02/20   [provider]  atorvastatin (LIPITOR) 20 MG tablet Take 20 mg by mouth daily.    [provider]  busPIRone (BUSPAR) 30 MG tablet Take 30 mg by mouth 2 (two) times daily.    [provider]  CALCIUM-VITAMIN D PO Take 1 tablet by mouth daily.    [provider]  cyclobenzaprine (FLEXERIL) 10 MG tablet Take 1 tablet (10 mg total) by mouth at bedtime. 11/19/20   Jacquelin Hawking, NP  dexamethasone (DECADRON) 4 MG tablet Take 5 tabs (20 mg) weekly the day after daratumumab for 12 weeks. Take with breakfast. 11/03/20   Lloyd Huger, MD  lenalidomide (REVLIMID) 10 MG capsule Take 1 capsule (10 mg total) by mouth daily. Take for 14 days, then hold for 7 days. 12/27/20   Lloyd Huger, MD  lidocaine-prilocaine (EMLA) cream Apply 1 application topically as needed. 11/01/20   Lloyd Huger, MD  liraglutide (VICTOZA) 18 MG/3ML SOPN Inject 1.2 mg into the skin daily.    [provider]  meloxicam (MOBIC) 15 MG tablet Take 15 mg by mouth daily.    [provider]  metFORMIN (GLUCOPHAGE) 500 MG tablet Take 1,000 mg by  mouth 2 (two) times daily with a meal.    [provider]  methocarbamol (ROBAXIN) 500 MG tablet Take 1 tablet (500 mg total) by mouth every 6 (six) hours as needed for muscle spasms. 10/28/20   Hessie Knows, MD  metoprolol succinate (TOPROL-XL) 50 MG 24 hr tablet Take 50 mg by mouth 2 (two) times daily. Take with or immediately following a meal.    [provider]  Misc Natural Products (GLUCOSAMINE CHOND CMP TRIPLE) TABS Take 1 tablet by mouth daily.    [provider]  morphine (MS CONTIN) 15 MG 12 hr tablet Take 2 tablets (30 mg total) by mouth every 12 (twelve) hours. 11/19/20   Jacquelin Hawking, NP  Multiple Vitamin (MULTIVITAMIN WITH MINERALS) TABS tablet Take 1 tablet by mouth  daily.    [provider]  omeprazole (PRILOSEC) 20 MG capsule Take 20 mg by mouth daily.    [provider]  ondansetron (ZOFRAN) 8 MG tablet Take 1 tablet (8 mg total) by mouth 2 (two) times daily as needed (Nausea or vomiting). 10/19/20   Lloyd Huger, MD  oxyCODONE-acetaminophen (PERCOCET/ROXICET) 5-325 MG tablet TAKE 1-2 TABLETS BY MOUTH EVERY 4 HOURS AS NEEDED FOR SEVERE PAIN 11/29/20   Lloyd Huger, MD  polyethylene glycol (MIRALAX / GLYCOLAX) 17 g packet Take by mouth daily as needed for mild constipation.    [provider]  prochlorperazine (COMPAZINE) 10 MG tablet Take 1 tablet (10 mg total) by mouth every 6 (six) hours as needed (Nausea or vomiting). 10/19/20   Lloyd Huger, MD  traMADol (ULTRAM) 50 MG tablet TAKE 1 TABLET BY MOUTH TWICE DAILY AS NEEDED FOR MODERATE PAIN 12/27/20   Lloyd Huger, MD      VITAL SIGNS:  Blood pressure (!) 119/47, pulse 89, temperature 97.7 F (36.5 C), resp. rate 16, height '5\' 2"'  (1.575 m), weight 63.5 kg, SpO2 95 %.  PHYSICAL EXAMINATION:  Physical Exam  GENERAL:  65 y.o.-year-old Caucasian female patient lying in the bed with no acute distress.  EYES: Pupils equal, round, reactive to light  and accommodation. No scleral icterus.  Mild pallor.  Extraocular muscles intact.  HEENT: Head with left periorbital lateral contusion, normocephalic. Oropharynx and nasopharynx clear.  NECK:  Supple, no jugular venous distention. No thyroid enlargement, no tenderness.  LUNGS: Normal breath sounds bilaterally, no wheezing, rales,rhonchi or crepitation. No use of accessory muscles of respiration.  CARDIOVASCULAR: Regular rate and rhythm, S1, S2 normal. No murmurs, rubs, or gallops.  ABDOMEN: Soft, nondistended, nontender. Bowel sounds present. No organomegaly or mass.  EXTREMITIES: No pedal edema, cyanosis, or clubbing.  NEUROLOGIC: Cranial nerves II through XII are intact. Muscle strength 5/5 in all extremities. Sensation intact. Gait not checked.  PSYCHIATRIC: The patient is alert and oriented x 3.  Normal affect and good eye contact. SKIN: No obvious rash, lesion, or ulcer.   LABORATORY PANEL:   CBC Recent Labs  Lab 12/31/20 1527  WBC 4.4  HGB 7.9*  HCT 24.7*  PLT 119*   ------------------------------------------------------------------------------------------------------------------  Chemistries  Recent Labs  Lab 12/31/20 1330 12/31/20 1527  NA 138 138  K 3.9 4.1  CL 105 106  CO2 23 22  GLUCOSE 129* 170*  BUN 21 21  CREATININE 1.33* 1.19*  CALCIUM 8.2* 8.5*  AST 28  --   ALT 22  --   ALKPHOS 122  --   BILITOT 0.2*  --    ------------------------------------------------------------------------------------------------------------------  Cardiac Enzymes No results for input(s): TROPONINI in the last 168 hours. ------------------------------------------------------------------------------------------------------------------  RADIOLOGY:  DG Chest 2 View  Result Date: 12/31/2020 CLINICAL DATA:  Syncope. EXAM: CHEST - 2 VIEW COMPARISON:  None. FINDINGS: A right-sided venous Port-A-Cath is seen with its distal tip noted at the junction of the superior vena cava and right  atrium. Mild, diffuse, chronic appearing increased lung markings are noted. There is no evidence of acute infiltrate, pleural effusion or pneumothorax. The heart size and mediastinal contours are within normal limits. Evidence of prior vertebroplasty is seen at the level of T12. IMPRESSION: No evidence of acute or active cardiopulmonary disease. Electronically Signed   By: Virgina Norfolk M.D.   On: 12/31/2020 16:02   CT Head Wo Contrast  Result Date: 12/31/2020 CLINICAL DATA:  History of multiple myeloma with recent  syncopal episode and fall, headaches and neck pain, initial encounter EXAM: CT HEAD WITHOUT CONTRAST CT CERVICAL SPINE WITHOUT CONTRAST TECHNIQUE: Multidetector CT imaging of the head and cervical spine was performed following the standard protocol without intravenous contrast. Multiplanar CT image reconstructions of the cervical spine were also generated. COMPARISON:  None. FINDINGS: CT HEAD FINDINGS Brain: No evidence of acute infarction, hemorrhage, hydrocephalus, extra-axial collection or mass lesion/mass effect. Vascular: No hyperdense vessel or unexpected calcification. Skull: Scattered lucencies are noted within the skull consistent with the given clinical history of multiple myeloma. Sinuses/Orbits: No acute finding. Other: None CT CERVICAL SPINE FINDINGS Alignment: Mild anterolisthesis of C3 on C4, C4 on C5 and C5 on C6 of a degenerative nature. Skull base and vertebrae: 7 cervical segments are well visualized. No compression deformities are seen. Mild osteophytic changes are noted. Degenerative facet hypertrophic changes are seen. Scattered mild lucencies are noted similar to that seen in the skull consistent with the given clinical history. No acute fracture or acute facet abnormality is noted. Soft tissues and spinal canal: Right-sided chest wall port is noted. Surrounding soft tissues are within normal limits. Upper chest: Visualized lung apices are unremarkable. Other: None  IMPRESSION: CT of the head: No acute intracranial abnormality noted. Multiple skull lucencies are noted consistent with the given clinical history of multiple myeloma. CT of the cervical spine: Degenerative change without acute abnormality. Mild lucency is consistent with the given clinical history of multiple myeloma. Electronically Signed   By: Inez Catalina M.D.   On: 12/31/2020 22:02   CT Cervical Spine Wo Contrast  Result Date: 12/31/2020 CLINICAL DATA:  History of multiple myeloma with recent syncopal episode and fall, headaches and neck pain, initial encounter EXAM: CT HEAD WITHOUT CONTRAST CT CERVICAL SPINE WITHOUT CONTRAST TECHNIQUE: Multidetector CT imaging of the head and cervical spine was performed following the standard protocol without intravenous contrast. Multiplanar CT image reconstructions of the cervical spine were also generated. COMPARISON:  None. FINDINGS: CT HEAD FINDINGS Brain: No evidence of acute infarction, hemorrhage, hydrocephalus, extra-axial collection or mass lesion/mass effect. Vascular: No hyperdense vessel or unexpected calcification. Skull: Scattered lucencies are noted within the skull consistent with the given clinical history of multiple myeloma. Sinuses/Orbits: No acute finding. Other: None CT CERVICAL SPINE FINDINGS Alignment: Mild anterolisthesis of C3 on C4, C4 on C5 and C5 on C6 of a degenerative nature. Skull base and vertebrae: 7 cervical segments are well visualized. No compression deformities are seen. Mild osteophytic changes are noted. Degenerative facet hypertrophic changes are seen. Scattered mild lucencies are noted similar to that seen in the skull consistent with the given clinical history. No acute fracture or acute facet abnormality is noted. Soft tissues and spinal canal: Right-sided chest wall port is noted. Surrounding soft tissues are within normal limits. Upper chest: Visualized lung apices are unremarkable. Other: None IMPRESSION: CT of the head: No  acute intracranial abnormality noted. Multiple skull lucencies are noted consistent with the given clinical history of multiple myeloma. CT of the cervical spine: Degenerative change without acute abnormality. Mild lucency is consistent with the given clinical history of multiple myeloma. Electronically Signed   By: Inez Catalina M.D.   On: 12/31/2020 22:02      IMPRESSION AND PLAN:  Active Problems:   Syncope  1.  Syncope likely secondary to symptomatic anemia.  She had associated dyspnea, therefore we will need to rule out PE. - The patient will be admitted to a med monitored observation bed. - The patient was  typed and crossmatched and will be transfused with 2 units of packed red blood cells. - We will obtain stat chest CTA. - We will follow orthostatics. - We will monitor her for arrhythmias. - This could be very well orthostatic hypotension and differential diagnoses include neurally mediated and cardiogenic syncope. - We will check a 2D echo.  2.  Dyslipidemia. - We will continue statin therapy.  3.  Anxiety. - We will continue BuSpar.  4.  Type 2 diabetes mellitus. - We will place the patient on supplement coverage with NovoLog and continue basal coverage. - We will hold off metformin for now.  5.  Essential hypertension. - We will continue Toprol-XL.  6.  Multiple myeloma. - We will continue Revlimid. - Pain management will be continued.  DVT prophylaxis: Lovenox. Code Status: full code. Family Communication:  The plan of care was discussed in details with the patient (and family). I answered all questions. The patient agreed to proceed with the above mentioned plan. Further management will depend upon hospital course. Disposition Plan: Back to previous home environment Consults called: none. All the records are reviewed and case discussed with ED provider.  Status is: Observation  Dispo: The patient is from: Home              Anticipated d/c is to: Home               Patient currently is not medically stable to d/c.              Difficult to place patient: No  TOTAL TIME TAKING CARE OF THIS PATIENT: 55 minutes.     Christel Mormon M.D on 12/31/2020 at 11:10 PM  [facility]   From 7 PM-7 AM, contact night-coverage [email or web address]  CC: Primary care physician; Albina Billet, MD

## 2020-12-31 NOTE — Progress Notes (Signed)
Pt transported to ED per request of Beckey Rutter, NP. Report given to triage nurse.

## 2020-12-31 NOTE — ED Notes (Addendum)
Pt c/o SOB at this time and dizziness when she starts ambulating. Dark bruise and swelling over right eye.

## 2020-12-31 NOTE — Progress Notes (Signed)
Symptom Management [place]  [phone] [phone]  Patient Care Team: Albina Billet, MD as PCP - General (Internal Medicine) Bary Castilla Forest Gleason, MD as Consulting Physician (General Surgery)   Name of the patient: Hannah Mcdonald  [id number]  Jul 30, 1955   Date of visit: 12/31/20  Diagnosis- lambda light chain myeloma  Chief complaint/ Reason for visit- syncope  Heme/Onc history:  Oncology History  Lambda light chain myeloma ([place])  08/24/2020 Initial Diagnosis   Lambda light chain myeloma ([place])   09/16/2020 Cancer Staging   Staging form: Plasma Cell Myeloma and Plasma Cell Disorders, AJCC 8th Edition - Clinical stage from 09/16/2020: RISS Stage I (Beta-2-microglobulin (mg/L): 2.5, Albumin (g/dL): 4.9, ISS: Stage I, High-risk cytogenetics: Absent, LDH: Normal) - Signed by Lloyd Huger, MD on 09/16/2020 Beta 2 microglobulin range (mg/L): Less than 3.5 Albumin range (g/dL): Greater than or equal to 3.5 Cytogenetics: 1p deletion    11/02/2020 -  Chemotherapy   Patient is on Treatment Plan : MYELOMA NEWLY DIAGNOSED TRANSPLANT CANDIDATE DaraVRd (Daratumumab SQ) q21d x 6 Cycles (Induction/Consolidation)       Interval history-patient is 65 year old female currently undergoing treatment of lambda light chain myeloma currently on DVRd chemotherapy who presents to symptom management for acute complaints of syncopal episode at home shortness of breath, and near syncope today.  She has felt progressively more short of breath over the past few weeks but today was unable to ambulate without significant shortness of breath which caused her to have to sit down.  At baseline she is fully ambulatory without aids.  Last night, her husband says she passed out in the bathroom, struck her face but otherwise uninjured. She was groggy after event but returned to her baseline.  No fevers or chills, no recent infections.  No cough or congestion. She  takes metoprolol for history of tachycardia.  Denies other cardiac history or similar previously been. Per patient, dizziness and shortness of breath occurs when changing position or exerting herself. None at rest. Not on blood thinners.   Review of systems- Review of Systems  Constitutional:  Negative for chills, fever, malaise/fatigue and weight loss.  HENT:  Negative for hearing loss, nosebleeds, sore throat and tinnitus.   Eyes:  Negative for blurred vision and double vision.  Respiratory:  Positive for shortness of breath. Negative for cough, hemoptysis and wheezing.   Cardiovascular:  Positive for leg swelling. Negative for chest pain, palpitations and orthopnea.  Gastrointestinal:  Negative for abdominal pain, blood in stool, constipation, diarrhea, melena, nausea and vomiting.  Genitourinary:  Negative for dysuria and urgency.  Musculoskeletal:  Positive for falls. Negative for back pain, joint pain and myalgias.  Skin:  Negative for itching and rash.  Neurological:  Positive for dizziness and loss of consciousness. Negative for tingling, sensory change, weakness and headaches.  Endo/Heme/Allergies:  Negative for environmental allergies. Does not bruise/bleed easily.  Psychiatric/Behavioral:  Negative for depression. The patient is not nervous/anxious and does not have insomnia.     No Known Allergies  Past Medical History:  Diagnosis Date   Anxiety    Diabetes mellitus without complication ([place]) 0973   GERD (gastroesophageal reflux disease)    Hyperlipidemia    Personal history of colonic polyps    Squamous cell carcinoma of skin 11/25/2013   Left chest. WD SCC with superficial infiltration.   Tachycardia     Past Surgical History:  Procedure Laterality Date   AUGMENTATION MAMMAPLASTY Bilateral 5329   silicone and  replacement in 2001   [place], [place], 1998,2003, 2008, 2013   COLONOSCOPY WITH PROPOFOL  N/A 08/16/2016   Procedure: COLONOSCOPY WITH PROPOFOL;  Surgeon: Robert Bellow, MD;  Location: [facility] ENDOSCOPY;  Service: Endoscopy;  Laterality: N/A;   DILATION AND CURETTAGE OF UTERUS     ENDOMETRIAL ABLATION  12/1991   ganglion cyst removal      IR IMAGING GUIDED PORT INSERTION  10/27/2020   KYPHOPLASTY N/A 10/28/2020   Procedure: T12 and L3 KYPHOPLASTY;  Surgeon: Hessie Knows, MD;  Location: [facility] ORS;  Service: Orthopedics;  Laterality: N/A;   TONSILLECTOMY     WRIST SURGERY Right 05/1995    Social History   Socioeconomic History   Marital status: Married    Spouse name: Not on file   Number of children: Not on file   Years of education: Not on file   Highest education level: Not on file  Occupational History   Not on file  Tobacco Use   Smoking status: Former    Packs/day: 1.00    Years: 4.00    Pack years: 4.00    Types: Cigarettes    Quit date: 27    Years since quitting: 40.8   Smokeless tobacco: Never  Substance and Sexual Activity   Alcohol use: Yes    Alcohol/week: 1.0 - 2.0 standard drink    Types: 1 - 2 Glasses of wine per week    Comment: occassionally   Drug use: No   Sexual activity: Not on file  Other Topics Concern   Not on file  Social History Narrative   Not on file   Social Determinants of Health   Financial Resource Strain: Not on file  Food Insecurity: Not on file  Transportation Needs: Not on file  Physical Activity: Not on file  Stress: Not on file  Social Connections: Not on file  Intimate Partner Violence: Not on file    Family History  Problem Relation Age of Onset   Colon polyps Sister    Colon cancer Father 1   Diabetes Mother    Breast cancer Neg Hx      Current Outpatient Medications:    acyclovir (ZOVIRAX) 400 MG tablet, Take 1 tablet (400 mg total) by mouth 2 (two) times daily., Disp: 60 tablet, Rfl: 5   ASPIRIN 81 PO, Take 81 mg by mouth daily., Disp: , Rfl:    atorvastatin (LIPITOR) 20 MG tablet, Take 20 mg by  mouth daily., Disp: , Rfl:    busPIRone (BUSPAR) 30 MG tablet, Take 30 mg by mouth 2 (two) times daily., Disp: , Rfl:    CALCIUM-VITAMIN D PO, Take 1 tablet by mouth daily., Disp: , Rfl:    cyclobenzaprine (FLEXERIL) 10 MG tablet, Take 1 tablet (10 mg total) by mouth at bedtime., Disp: 30 tablet, Rfl: 2   dexamethasone (DECADRON) 4 MG tablet, Take 5 tabs (20 mg) weekly the day after daratumumab for 12 weeks. Take with breakfast., Disp: , Rfl:    lenalidomide (REVLIMID) 10 MG capsule, Take 1 capsule (10 mg total) by mouth daily. Take for 14 days, then hold for 7 days., Disp: 14 capsule, Rfl: 0   lidocaine-prilocaine (EMLA) cream, Apply 1 application topically as needed., Disp: 30 g, Rfl: 2   liraglutide (VICTOZA) 18 MG/3ML SOPN, Inject 1.2 mg into the skin daily., Disp: , Rfl:    meloxicam (MOBIC) 15 MG tablet, Take 15 mg  by mouth daily., Disp: , Rfl:    metFORMIN (GLUCOPHAGE) 500 MG tablet, Take 1,000 mg by mouth 2 (two) times daily with a meal., Disp: , Rfl:    methocarbamol (ROBAXIN) 500 MG tablet, Take 1 tablet (500 mg total) by mouth every 6 (six) hours as needed for muscle spasms., Disp: 40 tablet, Rfl: 1   metoprolol succinate (TOPROL-XL) 50 MG 24 hr tablet, Take 50 mg by mouth 2 (two) times daily. Take with or immediately following a meal., Disp: , Rfl:    Misc Natural Products (GLUCOSAMINE CHOND CMP TRIPLE) TABS, Take 1 tablet by mouth daily., Disp: , Rfl:    morphine (MS CONTIN) 15 MG 12 hr tablet, Take 2 tablets (30 mg total) by mouth every 12 (twelve) hours., Disp: 120 tablet, Rfl: 0   Multiple Vitamin (MULTIVITAMIN WITH MINERALS) TABS tablet, Take 1 tablet by mouth daily., Disp: , Rfl:    omeprazole (PRILOSEC) 20 MG capsule, Take 20 mg by mouth daily., Disp: , Rfl:    ondansetron (ZOFRAN) 8 MG tablet, Take 1 tablet (8 mg total) by mouth 2 (two) times daily as needed (Nausea or vomiting)., Disp: 60 tablet, Rfl: 2   oxyCODONE-acetaminophen (PERCOCET/ROXICET) 5-325 MG tablet, TAKE 1-2  TABLETS BY MOUTH EVERY 4 HOURS AS NEEDED FOR SEVERE PAIN, Disp: 90 tablet, Rfl: 0   polyethylene glycol (MIRALAX / GLYCOLAX) 17 g packet, Take by mouth daily as needed for mild constipation., Disp: , Rfl:    prochlorperazine (COMPAZINE) 10 MG tablet, Take 1 tablet (10 mg total) by mouth every 6 (six) hours as needed (Nausea or vomiting)., Disp: 60 tablet, Rfl: 2   traMADol (ULTRAM) 50 MG tablet, TAKE 1 TABLET BY MOUTH TWICE DAILY AS NEEDED FOR MODERATE PAIN, Disp: 60 tablet, Rfl: 1 No current facility-administered medications for this visit.  Facility-Administered Medications Ordered in Other Visits:    sodium chloride flush (NS) 0.9 % injection 10 mL, 10 mL, Intravenous, PRN, Lloyd Huger, MD, 10 mL at 11/23/20 0932  Physical exam:  Vitals:   12/31/20 1348  BP: 118/65  Pulse: 81  Resp: 18  SpO2: 100%  Weight: 147 lb 14.4 oz (67.1 kg)   Physical Exam Constitutional:      General: She is not in acute distress.    Appearance: She is well-developed. She is not ill-appearing.  HENT:     Head:     Comments: Bruising of left orbit    Nose: Nose normal.     Mouth/Throat:     Pharynx: No oropharyngeal exudate.  Eyes:     General: No scleral icterus.    Extraocular Movements: Extraocular movements intact.     Pupils: Pupils are equal, round, and reactive to light.  Cardiovascular:     Rate and Rhythm: Normal rate and regular rhythm.     Pulses: Normal pulses.     Heart sounds: Normal heart sounds. No murmur heard. Pulmonary:     Effort: Pulmonary effort is normal.     Breath sounds: Normal breath sounds.  Abdominal:     General: There is no distension.     Palpations: Abdomen is soft.     Tenderness: There is no abdominal tenderness.  Musculoskeletal:        General: No tenderness or deformity. Normal range of motion.     Cervical back: Normal range of motion and neck supple.  Skin:    General: Skin is warm and dry.     Coloration: Skin is not pale.  Neurological:  General: No focal deficit present.     Mental Status: She is alert and oriented to person, place, and time.     Motor: No weakness.  Psychiatric:        Mood and Affect: Mood normal.        Behavior: Behavior normal.     CMP Latest Ref Rng & Units 12/21/2020  Glucose 70 - 99 mg/dL 131(H)  BUN 8 - 23 mg/dL 17  Creatinine 0.44 - 1.00 mg/dL 1.16(H)  Sodium 135 - 145 mmol/L 137  Potassium 3.5 - 5.1 mmol/L 4.1  Chloride 98 - 111 mmol/L 106  CO2 22 - 32 mmol/L 23  Calcium 8.9 - 10.3 mg/dL 8.4(L)  Total Protein 6.5 - 8.1 g/dL 6.4(L)  Total Bilirubin 0.3 - 1.2 mg/dL 0.4  Alkaline Phos 38 - 126 U/L 166(H)  AST 15 - 41 U/L 24  ALT 0 - 44 U/L 16   CBC Latest Ref Rng & Units 12/21/2020  WBC 4.0 - 10.5 K/uL 3.9(L)  Hemoglobin 12.0 - 15.0 g/dL 9.0(L)  Hematocrit 36.0 - 46.0 % 27.6(L)  Platelets 150 - 400 K/uL 98(L)   No images are attached to the encounter.  No results found.  Assessment and plan- Patient is a 65 y.o. female with lambda light chain myeloma currently undergoing daratumumab, velcade, revlimid, and dexamethasone chemotherapy with plan for autologous bone marrow transplant in the future.   She presents to [facility] for syncope last night and near syncope in clinic today. Hemoglobin has dropped to 8.2 however, not certain this drop accounts for her symptoms. Clinically orthostatic but vital signs don't support this. Appropriate oral intake reported, no obvious fluid volume deficit. On a beta blocker. Recommend cardiac workup in the ER to evaluate further. Patient agreeable.    Visit Diagnosis 1. Syncope and collapse   2. Shortness of breath    Patient expressed understanding and was in agreement with this plan. She also understands that She can call clinic at any time with any questions, concerns, or complaints.   Thank you for allowing me to participate in the care of this very pleasant patient.   Beckey Rutter, DNP, AGNP-C [place] [facility] [place]

## 2020-12-31 NOTE — ED Triage Notes (Signed)
Pt comes into the ED via Cancer center for syncopal episode and SHOB.  Pt states she gets extremely SHOB when standing or exerting herself.  She was going to the bathroom last night when she stood up and then had a syncopal episode.  Pt presented today and the cancer center and had another near syncopal episode when exerting herself.  Pt is a multiple myeloma patient actively receiving treatments.  Her hgb was 8 today.  Pt also takes metoprolol daily for consistent sinus tach.  Pt currently in NAD at this time with even and unlabored respirations.  Denies any chest pain or nausea, but has dizziness and SHOB.

## 2020-12-31 NOTE — ED Provider Notes (Signed)
[facility] Emergency Department Provider Note   ____________________________________________   Event Date/Time   First MD Initiated Contact with Patient 12/31/20 2131     (approximate)  I have reviewed the triage vital signs and the nursing notes.   HISTORY  Chief Complaint Loss of Consciousness and Shortness of Breath   HPI Hannah Mcdonald is a 65 y.o. female patient with multiple myeloma is getting short of breath today when she gets up.  She also had syncope twice while walking or getting up.  She was seen at the cancer center and sent here.  she is anemic.  Hemoglobin today 7.9 hematocrit 24 platelet count slightly low at 119.  This is down from the end of last month when she had an H&H of 9 and 27.  Patient did hit her head.  She does take aspirin.         Past Medical History:  Diagnosis Date   Anxiety    Diabetes mellitus without complication ([place]) 7026   GERD (gastroesophageal reflux disease)    Hyperlipidemia    Personal history of colonic polyps    Squamous cell carcinoma of skin 11/25/2013   Left chest. WD SCC with superficial infiltration.   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   Cervical radiculopathy 12/03/2019   Family history of colon cancer 07/06/2016   Sinus tachycardia 06/03/2003   Gastric reflux 08/02/1996    Past Surgical History:  Procedure Laterality Date   AUGMENTATION MAMMAPLASTY Bilateral 3785   silicone and replacement in 2001   [place], [place], 1998,2003, 2008, 2013   COLONOSCOPY WITH PROPOFOL N/A 08/16/2016   Procedure: COLONOSCOPY WITH PROPOFOL;  Surgeon: Robert Bellow, MD;  Location: [facility] ENDOSCOPY;  Service: Endoscopy;  Laterality: N/A;   DILATION AND CURETTAGE OF UTERUS      ENDOMETRIAL ABLATION  12/1991   ganglion cyst removal      IR IMAGING GUIDED PORT INSERTION  10/27/2020   KYPHOPLASTY N/A 10/28/2020   Procedure: T12 and L3 KYPHOPLASTY;  Surgeon: Hessie Knows, MD;  Location: [facility] ORS;  Service: Orthopedics;  Laterality: N/A;   TONSILLECTOMY     WRIST SURGERY Right 05/1995    Prior to Admission medications   Medication Sig Start Date End Date Taking? Authorizing Provider  acyclovir (ZOVIRAX) 400 MG tablet Take 1 tablet (400 mg total) by mouth 2 (two) times daily. 10/19/20   Lloyd Huger, MD  ASPIRIN 81 PO Take 81 mg by mouth daily. 11/02/20   [provider]  atorvastatin (LIPITOR) 20 MG tablet Take 20 mg by mouth daily.    [provider]  busPIRone (BUSPAR) 30 MG tablet Take 30 mg by mouth 2 (two) times daily.    [provider]  CALCIUM-VITAMIN D PO Take 1 tablet by mouth daily.    [provider]  cyclobenzaprine (FLEXERIL) 10 MG tablet Take 1 tablet (10 mg total) by mouth at bedtime. 11/19/20   Jacquelin Hawking, NP  dexamethasone (DECADRON) 4 MG tablet Take 5 tabs (20 mg) weekly the day after daratumumab for 12 weeks. Take with breakfast. 11/03/20   Lloyd Huger, MD  lenalidomide (REVLIMID) 10 MG capsule Take 1 capsule (10 mg total) by mouth daily. Take for 14  days, then hold for 7 days. 12/27/20   Lloyd Huger, MD  lidocaine-prilocaine (EMLA) cream Apply 1 application topically as needed. 11/01/20   Lloyd Huger, MD  liraglutide (VICTOZA) 18 MG/3ML SOPN Inject 1.2 mg into the skin daily.    [provider]  meloxicam (MOBIC) 15 MG tablet Take 15 mg by mouth daily.    [provider]  metFORMIN (GLUCOPHAGE) 500 MG tablet Take 1,000 mg by mouth 2 (two) times daily with a meal.    [provider]  methocarbamol (ROBAXIN) 500 MG tablet Take 1 tablet (500 mg total) by mouth every 6 (six) hours as needed for muscle spasms. 10/28/20   Hessie Knows, MD  metoprolol succinate  (TOPROL-XL) 50 MG 24 hr tablet Take 50 mg by mouth 2 (two) times daily. Take with or immediately following a meal.    [provider]  Misc Natural Products (GLUCOSAMINE CHOND CMP TRIPLE) TABS Take 1 tablet by mouth daily.    [provider]  morphine (MS CONTIN) 15 MG 12 hr tablet Take 2 tablets (30 mg total) by mouth every 12 (twelve) hours. 11/19/20   Jacquelin Hawking, NP  Multiple Vitamin (MULTIVITAMIN WITH MINERALS) TABS tablet Take 1 tablet by mouth daily.    [provider]  omeprazole (PRILOSEC) 20 MG capsule Take 20 mg by mouth daily.    [provider]  ondansetron (ZOFRAN) 8 MG tablet Take 1 tablet (8 mg total) by mouth 2 (two) times daily as needed (Nausea or vomiting). 10/19/20   Lloyd Huger, MD  oxyCODONE-acetaminophen (PERCOCET/ROXICET) 5-325 MG tablet TAKE 1-2 TABLETS BY MOUTH EVERY 4 HOURS AS NEEDED FOR SEVERE PAIN 11/29/20   Lloyd Huger, MD  polyethylene glycol (MIRALAX / GLYCOLAX) 17 g packet Take by mouth daily as needed for mild constipation.    [provider]  prochlorperazine (COMPAZINE) 10 MG tablet Take 1 tablet (10 mg total) by mouth every 6 (six) hours as needed (Nausea or vomiting). 10/19/20   Lloyd Huger, MD  traMADol (ULTRAM) 50 MG tablet TAKE 1 TABLET BY MOUTH TWICE DAILY AS NEEDED FOR MODERATE PAIN 12/27/20   Lloyd Huger, MD    Allergies Patient has no known allergies.  Family History  Problem Relation Age of Onset   Colon polyps Sister    Colon cancer Father 79   Diabetes Mother    Breast cancer Neg Hx     Social History Social History   Tobacco Use   Smoking status: Former    Packs/day: 1.00    Years: 4.00    Pack years: 4.00    Types: Cigarettes    Quit date: 1982    Years since quitting: 40.8   Smokeless tobacco: Never  Substance Use Topics   Alcohol use: Yes    Alcohol/week: 1.0 - 2.0 standard drink    Types: 1 - 2 Glasses of wine per week    Comment: occassionally    Drug use: No    Review of Systems Currently Constitutional: No fever/chills Eyes: No visual changes. ENT: No sore throat. Cardiovascular: Denies chest pain. Respiratory: Denies shortness of breath. Gastrointestinal: No abdominal pain.  No nausea, no vomiting.  No diarrhea.  No constipation. Genitourinary: Negative for dysuria. Musculoskeletal: Negative for back pain. Skin: Negative for rash. Neurological: Negative for headaches, focal weakness   ____________________________________________   PHYSICAL EXAM:  VITAL SIGNS: ED Triage Vitals  Enc Vitals Group     BP 12/31/20 1521 108/61  Pulse Rate 12/31/20 1521 86     Resp 12/31/20 1521 17     Temp 12/31/20 1521 97.7 F (36.5 C)     Temp src --      SpO2 12/31/20 1521 100 %     Weight 12/31/20 1529 140 lb (63.5 kg)     Height 12/31/20 1529 '5\' 2"'  (1.575 m)     Head Circumference --      Peak Flow --      Pain Score 12/31/20 1529 0     Pain Loc --      Pain Edu? --      Excl. in [place]? --     Constitutional: Alert and oriented. Well appearing and in no acute distress. Eyes: Conjunctivae are normal. PERRL. EOMI. Head: Atraumatic patient does have a bruise around the left lateral orbit with a little bit of blood present.  This is from falling and hitting her head today. Nose: No congestion/rhinnorhea. Mouth/Throat: Mucous membranes are moist.  Oropharynx non-erythematous. Neck: No stridor. Cardiovascular: Normal rate, regular rhythm. Grossly normal heart sounds.  Good peripheral circulation. Respiratory: Normal respiratory effort.  No retractions. Lungs CTAB. Gastrointestinal: Soft and nontender. No distention. No abdominal bruits.  Musculoskeletal: No lower extremity tenderness trace edema in the feet Neurologic:  Normal speech and language. No gross focal neurologic deficits are appreciated.  Skin:  Skin is warm, dry and intact. No rash noted.   ____________________________________________   LABS (all labs ordered  are listed, but only abnormal results are displayed)  Labs Reviewed  BASIC METABOLIC PANEL - Abnormal; Notable for the following components:      Result Value   Glucose, Bld 170 (*)    Creatinine, Ser 1.19 (*)    Calcium 8.5 (*)    GFR, Estimated 51 (*)    All other components within normal limits  CBC - Abnormal; Notable for the following components:   RBC 2.45 (*)    Hemoglobin 7.9 (*)    HCT 24.7 (*)    MCV 100.8 (*)    RDW 15.8 (*)    Platelets 119 (*)    All other components within normal limits  TROPONIN I (HIGH SENSITIVITY) - Abnormal; Notable for the following components:   Troponin I (High Sensitivity) 18 (*)    All other components within normal limits  URINALYSIS, ROUTINE W REFLEX MICROSCOPIC  SAMPLE TO BLOOD BANK  PREPARE RBC (CROSSMATCH)  TYPE AND SCREEN   ____________________________________________  EKG  EKG read interpreted by me shows normal sinus rhythm rate of 88 normal axis essentially normal EKG ____________________________________________  RADIOLOGY Gertha Calkin, personally viewed and evaluated these images (plain radiographs) as part of my medical decision making, as well as reviewing the written report by the radiologist.  ED MD interpretation: Chest x-ray read by radiology reviewed by me is negative. CT of the head and neck read by radiology reviewed by me shows no acute fractures or other acute problems. Official radiology report(s): DG Chest 2 View  Result Date: 12/31/2020 CLINICAL DATA:  Syncope. EXAM: CHEST - 2 VIEW COMPARISON:  None. FINDINGS: A right-sided venous Port-A-Cath is seen with its distal tip noted at the junction of the superior vena cava and right atrium. Mild, diffuse, chronic appearing increased lung markings are noted. There is no evidence of acute infiltrate, pleural effusion or pneumothorax. The heart size and mediastinal contours are within normal limits. Evidence of prior vertebroplasty is seen at the level of T12.  IMPRESSION: No evidence of  acute or active cardiopulmonary disease. Electronically Signed   By: Virgina Norfolk M.D.   On: 12/31/2020 16:02   CT Head Wo Contrast  Result Date: 12/31/2020 CLINICAL DATA:  History of multiple myeloma with recent syncopal episode and fall, headaches and neck pain, initial encounter EXAM: CT HEAD WITHOUT CONTRAST CT CERVICAL SPINE WITHOUT CONTRAST TECHNIQUE: Multidetector CT imaging of the head and cervical spine was performed following the standard protocol without intravenous contrast. Multiplanar CT image reconstructions of the cervical spine were also generated. COMPARISON:  None. FINDINGS: CT HEAD FINDINGS Brain: No evidence of acute infarction, hemorrhage, hydrocephalus, extra-axial collection or mass lesion/mass effect. Vascular: No hyperdense vessel or unexpected calcification. Skull: Scattered lucencies are noted within the skull consistent with the given clinical history of multiple myeloma. Sinuses/Orbits: No acute finding. Other: None CT CERVICAL SPINE FINDINGS Alignment: Mild anterolisthesis of C3 on C4, C4 on C5 and C5 on C6 of a degenerative nature. Skull base and vertebrae: 7 cervical segments are well visualized. No compression deformities are seen. Mild osteophytic changes are noted. Degenerative facet hypertrophic changes are seen. Scattered mild lucencies are noted similar to that seen in the skull consistent with the given clinical history. No acute fracture or acute facet abnormality is noted. Soft tissues and spinal canal: Right-sided chest wall port is noted. Surrounding soft tissues are within normal limits. Upper chest: Visualized lung apices are unremarkable. Other: None IMPRESSION: CT of the head: No acute intracranial abnormality noted. Multiple skull lucencies are noted consistent with the given clinical history of multiple myeloma. CT of the cervical spine: Degenerative change without acute abnormality. Mild lucency is consistent with the given  clinical history of multiple myeloma. Electronically Signed   By: Inez Catalina M.D.   On: 12/31/2020 22:02   CT Cervical Spine Wo Contrast  Result Date: 12/31/2020 CLINICAL DATA:  History of multiple myeloma with recent syncopal episode and fall, headaches and neck pain, initial encounter EXAM: CT HEAD WITHOUT CONTRAST CT CERVICAL SPINE WITHOUT CONTRAST TECHNIQUE: Multidetector CT imaging of the head and cervical spine was performed following the standard protocol without intravenous contrast. Multiplanar CT image reconstructions of the cervical spine were also generated. COMPARISON:  None. FINDINGS: CT HEAD FINDINGS Brain: No evidence of acute infarction, hemorrhage, hydrocephalus, extra-axial collection or mass lesion/mass effect. Vascular: No hyperdense vessel or unexpected calcification. Skull: Scattered lucencies are noted within the skull consistent with the given clinical history of multiple myeloma. Sinuses/Orbits: No acute finding. Other: None CT CERVICAL SPINE FINDINGS Alignment: Mild anterolisthesis of C3 on C4, C4 on C5 and C5 on C6 of a degenerative nature. Skull base and vertebrae: 7 cervical segments are well visualized. No compression deformities are seen. Mild osteophytic changes are noted. Degenerative facet hypertrophic changes are seen. Scattered mild lucencies are noted similar to that seen in the skull consistent with the given clinical history. No acute fracture or acute facet abnormality is noted. Soft tissues and spinal canal: Right-sided chest wall port is noted. Surrounding soft tissues are within normal limits. Upper chest: Visualized lung apices are unremarkable. Other: None IMPRESSION: CT of the head: No acute intracranial abnormality noted. Multiple skull lucencies are noted consistent with the given clinical history of multiple myeloma. CT of the cervical spine: Degenerative change without acute abnormality. Mild lucency is consistent with the given clinical history of multiple  myeloma. Electronically Signed   By: Inez Catalina M.D.   On: 12/31/2020 22:02    ____________________________________________   PROCEDURES  Procedure(s) performed (including Critical Care):  Procedures   ____________________________________________   INITIAL IMPRESSION / ASSESSMENT AND PLAN / ED COURSE  Because the patient has a low platelet count and takes aspirin and had syncope and fell twice and hit her head I will go ahead and get a CT of her head and neck.  Additionally multiple myeloma if it is involving the cervical spine can predispose to fracture.  I have seen several older patients with multiple myeloma and painless C-spine fractures.             ____________________________________________   FINAL CLINICAL IMPRESSION(S) / ED DIAGNOSES  Final diagnoses:  Symptomatic anemia     ED Discharge Orders     None        Note:  This document was prepared using Dragon voice recognition software and may include unintentional dictation errors.    Nena Polio, MD 12/31/20 2218

## 2020-12-31 NOTE — ED Notes (Signed)
Pt reports she has cancer treatments two weeks on one week off, nd this is her week off. Pt denies blood in stool/emesis.

## 2021-01-01 ENCOUNTER — Observation Stay (HOSPITAL_BASED_OUTPATIENT_CLINIC_OR_DEPARTMENT_OTHER)
Admit: 2021-01-01 | Discharge: 2021-01-01 | Disposition: A | Discharge status: Home / Self Care | Payer: BC Managed Care – PPO | Attending: Internal Medicine | Admitting: Internal Medicine

## 2021-01-01 ENCOUNTER — Observation Stay: Payer: BC Managed Care – PPO

## 2021-01-01 DIAGNOSIS — C9000 Multiple myeloma not having achieved remission: Secondary | ICD-10-CM | POA: Diagnosis not present

## 2021-01-01 DIAGNOSIS — E1169 Type 2 diabetes mellitus with other specified complication: Secondary | ICD-10-CM | POA: Diagnosis not present

## 2021-01-01 DIAGNOSIS — R55 Syncope and collapse: Secondary | ICD-10-CM | POA: Diagnosis not present

## 2021-01-01 LAB — BASIC METABOLIC PANEL
Anion gap: 6 (ref 5–15)
BUN: 13 mg/dL (ref 8–23)
CO2: 26 mmol/L (ref 22–32)
Calcium: 7.9 mg/dL — ABNORMAL LOW (ref 8.9–10.3)
Chloride: 108 mmol/L (ref 98–111)
Creatinine, Ser: 0.97 mg/dL (ref 0.44–1.00)
GFR, Estimated: >60 mL/min (ref >60)
Glucose, Bld: 120 mg/dL — ABNORMAL HIGH (ref 70–99)
Potassium: 4 mmol/L (ref 3.5–5.1)
Sodium: 140 mmol/L (ref 135–145)

## 2021-01-01 LAB — URINALYSIS, ROUTINE W REFLEX MICROSCOPIC
Bilirubin Urine: NEGATIVE
Glucose, UA: NEGATIVE mg/dL
Hgb urine dipstick: NEGATIVE
Ketones, ur: NEGATIVE mg/dL
Nitrite: NEGATIVE
Protein, ur: NEGATIVE mg/dL
Specific Gravity, Urine: 1.008 (ref 1.005–1.030)
WBC, UA: >50 WBC/hpf — ABNORMAL HIGH (ref 0–5)
pH: 6 (ref 5.0–8.0)

## 2021-01-01 LAB — CBC
HCT: 29.6 % — ABNORMAL LOW (ref 36.0–46.0)
Hemoglobin: 10 g/dL — ABNORMAL LOW (ref 12.0–15.0)
MCH: 32.8 pg (ref 26.0–34.0)
MCHC: 33.8 g/dL (ref 30.0–36.0)
MCV: 97 fL (ref 80.0–100.0)
Platelets: 130 10*3/uL — ABNORMAL LOW (ref 150–400)
RBC: 3.05 MIL/uL — ABNORMAL LOW (ref 3.87–5.11)
RDW: 16.4 % — ABNORMAL HIGH (ref 11.5–15.5)
WBC: 3.8 10*3/uL — ABNORMAL LOW (ref 4.0–10.5)
nRBC: 0 % (ref 0.0–0.2)

## 2021-01-01 LAB — HIV ANTIBODY (ROUTINE TESTING W REFLEX): HIV Screen 4th Generation wRfx: NONREACTIVE

## 2021-01-01 MED ORDER — IOHEXOL 350 MG/ML SOLN
75.0000 mL | Freq: Once | INTRAVENOUS | Status: AC | PRN
  Administered 2021-01-01: 75 mL via INTRAVENOUS

## 2021-01-01 MED ORDER — HEPARIN SOD (PORK) LOCK FLUSH 100 UNIT/ML IV SOLN
INTRAVENOUS | Status: AC
  Filled 2021-01-01: qty 5

## 2021-01-01 NOTE — ED Notes (Signed)
Messaged md about pt echocardiogram order. This rn called Endo/radiology and can not get ahold of the staff there. Md aware and awaiting Instructions.

## 2021-01-01 NOTE — Progress Notes (Signed)
*  PRELIMINARY RESULTS* Echocardiogram 2D Echocardiogram has been performed.  Claretta Fraise 01/01/2021, 2:46 PM

## 2021-01-01 NOTE — Evaluation (Addendum)
Physical Therapy Evaluation Patient Details Name: Hannah Mcdonald MRN: [id number] DOB: 03/22/1955 Today's Date: 01/01/2021  History of Present Illness  Pt is a 65 y/o F admitted on 12/31/20 with c/c of SOB & loss of consciousness. Pt with 1 episode of syncope where she fell & hit her head resulting in a periorbital contusion. Pt is being treated for syncope likely 2/2 to symptomatic anemia.   PMH: multiple myeloma, DM2, dyslipidemia, GERD, anxiety  Clinical Impression  Pt seen for PT evaluation with pt reporting she's independent without AD prior to admission. On this date, pt is mod I/independent with bed mobility & transfers and is able to ambulate in the hallway without AD & mod I with decreased gait speed. Pt is orthostatic & PT educates her on need to pause after standing before walking to allow BP to increase with pt voicing understanding. Pt would benefit from OPPT f/u to focus on high level balance & reduce fall risk.   BP checked in LUE: Sitting semi fowler in bed: 136/100 mmHg (MAP 112) Standing at 0: 131/67 mmHg (MAP 85) Sitting at end of session: 157/87 mmHg (MAP 108)      Recommendations for follow up therapy are one component of a multi-disciplinary discharge planning process, led by the attending physician.  Recommendations may be updated based on patient status, additional functional criteria and insurance authorization.  Follow Up Recommendations Outpatient PT    Assistance Recommended at Discharge PRN  Functional Status Assessment Patient has not had a recent decline in their functional status  Equipment Recommendations   (tub transfer bench)    Recommendations for Other Services       Precautions / Restrictions Precautions Precautions: None      Mobility  Bed Mobility Overal bed mobility: Modified Independent             General bed mobility comments: supine<>sit with HOB elevated    Transfers Overall transfer level: Independent                  General transfer comment: sit<>stand without AD    Ambulation/Gait Ambulation/Gait assistance: Modified independent (Device/Increase time) Gait Distance (Feet): 150 Feet Assistive device: None Gait Pattern/deviations: Decreased stride length Gait velocity: decreased      Stairs            Wheelchair Mobility    Modified Rankin (Stroke Patients Only)       Balance Overall balance assessment: Needs assistance   Sitting balance-Leahy Scale: Normal     Standing balance support: No upper extremity supported;During functional activity Standing balance-Leahy Scale: Good                               Pertinent Vitals/Pain Pain Assessment: No/denies pain    Home Living Family/patient expects to be discharged to:: Private residence Living Arrangements: Spouse/significant other Available Help at Discharge: Family;Available 24 hours/day Type of Home: House Home Access: Stairs to enter Entrance Stairs-Rails: None Entrance Stairs-Number of Steps: 2   Home Layout: Able to live on main level with bedroom/bathroom (tub on 1st floor, shower on 2nd) Home Equipment: Rolling Walker (2 wheels);Rollator (4 wheels);Transport chair Additional Comments: Tub with hand held shower downstairs, walk in shower upstairs.    Prior Function Prior Level of Function : Independent/Modified Independent             Mobility Comments: Pt reports 4 falls in the past 6 months (several of them where she   becomes dizzy & slowly lowers herself to the ground). Has her license but has minimized driving.       Hand Dominance        Extremity/Trunk Assessment   Upper Extremity Assessment Upper Extremity Assessment: Overall WFL for tasks assessed    Lower Extremity Assessment Lower Extremity Assessment: Overall WFL for tasks assessed       Communication      Cognition Arousal/Alertness: Awake/alert Behavior During Therapy: WFL for tasks assessed/performed Overall  Cognitive Status: Within Functional Limits for tasks assessed                                          General Comments      Exercises     Assessment/Plan    PT Assessment Patient needs continued PT services  PT Problem List Decreased strength;Decreased balance       PT Treatment Interventions Balance training;Stair training;Therapeutic activities;Patient/family education;Neuromuscular re-education    PT Goals (Current goals can be found in the Care Plan section)  Acute Rehab PT Goals Patient Stated Goal: Get better, pt hopeful to receive a stem cell transplant in the next couple of months PT Goal Formulation: With patient Time For Goal Achievement: 01/15/21 Potential to Achieve Goals: Good Additional Goals Additional Goal #1: Pt will ambulate 300 ft with LRAD independently to increase independence with community mobility & increase endurance.    Frequency Min 2X/week   Barriers to discharge        Co-evaluation               AM-PAC PT "6 Clicks" Mobility  Outcome Measure Help needed turning from your back to your side while in a flat bed without using bedrails?: None Help needed moving from lying on your back to sitting on the side of a flat bed without using bedrails?: None Help needed moving to and from a bed to a chair (including a wheelchair)?: None Help needed standing up from a chair using your arms (e.g., wheelchair or bedside chair)?: None Help needed to walk in hospital room?: None Help needed climbing 3-5 steps with a railing? : A Little 6 Click Score: 23    End of Session   Activity Tolerance: Patient tolerated treatment well Patient left: in bed;with call bell/phone within reach;with family/visitor present Nurse Communication:  (orthostatics) PT Visit Diagnosis: Unsteadiness on feet (R26.81)    Time: 1657-9038 PT Time Calculation (min) (ACUTE ONLY): 17 min   Charges:   PT Evaluation $PT Eval Low Complexity: [place], PT, DPT 01/01/21, 1:59 PM   Waunita Schooner 01/01/2021, 1:59 PM

## 2021-01-01 NOTE — ED Notes (Signed)
Echo tech at bedside. Md messaged

## 2021-01-01 NOTE — Discharge Summary (Addendum)
Physician Discharge Summary  Patient ID: Hannah Mcdonald MRN: [id number] DOB/AGE: 1955/06/30 65 y.o.  Admit date: 12/31/2020 Discharge date: 01/01/2021  Admission Diagnoses:  Discharge Diagnoses:  Active Problems:   Syncope   Discharged Condition: fair  Hospital Course:  Hannah Mcdonald is a 65 y.o. Caucasian female with medical history significant for multiple myeloma, type 2 diabetes mellitus, dyslipidemia, GERD and anxiety, who presented to the emergency room with acute onset of dyspnea when she was getting up.  She had 1 episode of syncope while sitting on the toilet preceded by dyspnea then she fell and hit her had with subsequent left periorbital contusion around 4:30 AM today.   Patient also has been complaining of dizziness and shortness of breath when she is standing up, this has been happening for 2 weeks. Upon arriving the hospital, her blood pressure is elevated, her hemoglobin was 7.9, he received 1 unit PRBC, hemoglobin went up to 10.  She had a normal bowel movement yesterday, she has not had any GI bleed. She also had a CT angiogram of the chest, ruled out PE.  Her troponin is 18, essentially within normal limits. At this point, patient is medically stable to be discharged. I will set up home care.   Syncope. Dyspnea with exertion. Telemetry did not show any arrhythmia.  Patient does not have acute coronary syndrome, no evidence of congestive heart failure.  No PE. Condition is more likely due to anemia. Patient will need echocardiogram to rule out pulmonary hypertension versus valvular heart disease. Echocardiogram has been ordered, if cannot perform at weekend, can be performed as outpatient. Patient condition had improved, hemoglobin is better.  No evidence of bleeding.  She is medically stable to be discharged.  Multiple myeloma. 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  Type 2 diabetes. No change in treatment plan  for  Essential hypertension. Continue home medicines.   Consults: None  Significant Diagnostic Studies: \ CT ANGIOGRAPHY CHEST WITH CONTRAST   TECHNIQUE: Multidetector CT imaging of the chest was performed using the standard protocol during bolus administration of intravenous contrast. Multiplanar CT image reconstructions and MIPs were obtained to evaluate the vascular anatomy.   CONTRAST:  59m OMNIPAQUE IOHEXOL 350 MG/ML SOLN   COMPARISON:  08/03/2020   FINDINGS: Cardiovascular: Thoracic aorta is well visualized without aneurysmal dilatation or focal dissection. Pulmonary artery is well visualized without focal filling defect to suggest pulmonary embolism. No coronary calcifications or cardiac enlargement is seen. Right-sided chest wall port is noted in satisfactory position.   Mediastinum/Nodes: Thoracic inlet is within normal limits. No sizable hilar or mediastinal adenopathy is noted. The esophagus as visualized is within normal limits.   Lungs/Pleura: Lungs are well aerated bilaterally. No focal infiltrate or sizable effusion is seen.   Upper Abdomen: Visualized upper abdomen is unremarkable.   Musculoskeletal: Bilateral breast implants are noted. Multiple rib fractures with varying degrees of callus formation are noted consistent with pathologic fractures from the patient's known multiple myeloma. These are noted bilaterally. Changes of prior vertebral augmentation are seen at T12. Areas of mixed lytic and sclerotic changes are noted consistent with the given clinical history. Mild vertebral body height loss is noted at T2, T4, T5 and T9 consistent with compression deformities. These have progressed slightly in the interval from the prior MRI from 10/19/2020   Review of the MIP images confirms the above findings.   IMPRESSION: No evidence of pulmonary emboli.   No focal infiltrate is seen.  Changes of multiple myeloma with multiple pathologic fractures  with varying degrees of callus formation. Additionally multiple thoracic compression deformities are seen which have progressed from prior exam from 10/19/2020.  CT HEAD FINDINGS   Brain: No evidence of acute infarction, hemorrhage, hydrocephalus, extra-axial collection or mass lesion/mass effect.   Vascular: No hyperdense vessel or unexpected calcification.   Skull: Scattered lucencies are noted within the skull consistent with the given clinical history of multiple myeloma.   Sinuses/Orbits: No acute finding.   Other: None   CT CERVICAL SPINE FINDINGS   Alignment: Mild anterolisthesis of C3 on C4, C4 on C5 and C5 on C6 of a degenerative nature.   Skull base and vertebrae: 7 cervical segments are well visualized. No compression deformities are seen. Mild osteophytic changes are noted. Degenerative facet hypertrophic changes are seen. Scattered mild lucencies are noted similar to that seen in the skull consistent with the given clinical history. No acute fracture or acute facet abnormality is noted.   Soft tissues and spinal canal: Right-sided chest wall port is noted. Surrounding soft tissues are within normal limits.   Upper chest: Visualized lung apices are unremarkable.   Other: None   IMPRESSION: CT of the head: No acute intracranial abnormality noted.   Multiple skull lucencies are noted consistent with the given clinical history of multiple myeloma.   CT of the cervical spine: Degenerative change without acute abnormality.   Mild lucency is consistent with the given clinical history of multiple myeloma.     Electronically Signed   By: Inez Catalina M.D.   On: 12/31/2020 22:02     Treatments: PRBC, IVF  Discharge Exam: Blood pressure 124/77, pulse 90, temperature 98.2 F (36.8 C), temperature source Oral, resp. rate 13, height '5\' 2"'  (1.575 m), weight 63.5 kg, SpO2 97 %. General appearance: alert and cooperative Resp: clear to auscultation  bilaterally Cardio: regular rate and rhythm, S1, S2 normal, no murmur, click, rub or gallop GI: soft, non-tender; bowel sounds normal; no masses,  no organomegaly Extremities: extremities normal, atraumatic, no cyanosis or edema  Disposition: Discharge disposition: 01-Home or Self Care       Discharge Instructions     Diet - low sodium heart healthy   Complete by: As directed    Increase activity slowly   Complete by: As directed       Allergies as of 01/01/2021   No Known Allergies      Medication List     TAKE these medications    acyclovir 400 MG tablet Commonly known as: ZOVIRAX Take 1 tablet (400 mg total) by mouth 2 (two) times daily.   ASPIRIN 81 PO Take 81 mg by mouth daily.   atorvastatin 20 MG tablet Commonly known as: LIPITOR Take 20 mg by mouth daily.   busPIRone 30 MG tablet Commonly known as: BUSPAR Take 30 mg by mouth 2 (two) times daily.   CALCIUM-VITAMIN D PO Take 1 tablet by mouth daily.   cyclobenzaprine 10 MG tablet Commonly known as: FLEXERIL Take 1 tablet (10 mg total) by mouth at bedtime.   dexamethasone 4 MG tablet Commonly known as: DECADRON Take 5 tabs (20 mg) weekly the day after daratumumab for 12 weeks. Take with breakfast.   Glucosamine Chond Cmp Triple Tabs Take 1 tablet by mouth daily.   lenalidomide 10 MG capsule Commonly known as: REVLIMID Take 1 capsule (10 mg total) by mouth daily. Take for 14 days, then hold for 7 days.   lidocaine-prilocaine cream Commonly  known as: EMLA Apply 1 application topically as needed.   liraglutide 18 MG/3ML Sopn Commonly known as: VICTOZA Inject 1.2 mg into the skin daily.   meloxicam 15 MG tablet Commonly known as: MOBIC Take 15 mg by mouth daily.   metFORMIN 500 MG tablet Commonly known as: GLUCOPHAGE Take 1,000 mg by mouth 2 (two) times daily with a meal.   methocarbamol 500 MG tablet Commonly known as: Robaxin Take 1 tablet (500 mg total) by mouth every 6 (six) hours  as needed for muscle spasms.   metoprolol succinate 50 MG 24 hr tablet Commonly known as: TOPROL-XL Take 50 mg by mouth 2 (two) times daily. Take with or immediately following a meal.   morphine 15 MG 12 hr tablet Commonly known as: MS CONTIN Take 2 tablets (30 mg total) by mouth every 12 (twelve) hours.   multivitamin with minerals Tabs tablet Take 1 tablet by mouth daily.   omeprazole 20 MG capsule Commonly known as: PRILOSEC Take 40 mg by mouth daily. Pt taking differently 40 mg instead of 20.   ondansetron 8 MG tablet Commonly known as: Zofran Take 1 tablet (8 mg total) by mouth 2 (two) times daily as needed (Nausea or vomiting).   oxyCODONE-acetaminophen 5-325 MG tablet Commonly known as: PERCOCET/ROXICET TAKE 1-2 TABLETS BY MOUTH EVERY 4 HOURS AS NEEDED FOR SEVERE PAIN   polyethylene glycol 17 g packet Commonly known as: MIRALAX / GLYCOLAX Take by mouth daily as needed for mild constipation.   prochlorperazine 10 MG tablet Commonly known as: COMPAZINE Take 1 tablet (10 mg total) by mouth every 6 (six) hours as needed (Nausea or vomiting).   traMADol 50 MG tablet Commonly known as: ULTRAM TAKE 1 TABLET BY MOUTH TWICE DAILY AS NEEDED FOR MODERATE PAIN        Follow-up Information     Albina Billet, MD Follow up in 1 week(s).   Specialty: Internal Medicine Contact information: [address]   [place] [address] [phone]                 Signed: Sharen Hones 01/01/2021, 11:19 AM

## 2021-01-01 NOTE — Discharge Instructions (Signed)
Follow up with PCP for echo results. Follow with oncology for futute multiple myeloma treatment

## 2021-01-01 NOTE — ED Notes (Signed)
Breakfast tray given. °

## 2021-01-01 NOTE — ED Notes (Signed)
Spoke with pharmacy and they state that pt must bring Liraglutide from home. Pt verbalized understanding and will instruct family to bring from home.

## 2021-01-01 NOTE — ED Notes (Signed)
Echo completed, pt resting in bed asking about Dc orders. Md messaged securely via chat.

## 2021-01-01 NOTE — ED Notes (Signed)
Dr. Roosevelt Locks MD attending at bedside.

## 2021-01-01 NOTE — ED Notes (Signed)
Patient transported to CT 

## 2021-01-01 NOTE — ED Notes (Signed)
2nd to attempt to obtain 2nd unit of PRBC for transfusion; Blood Bank staff reports that unit has not arrived to campus from [facility].

## 2021-01-02 ENCOUNTER — Other Ambulatory Visit: Payer: Self-pay | Admitting: Oncology

## 2021-01-02 ENCOUNTER — Encounter: Payer: Self-pay | Admitting: Dermatology

## 2021-01-02 LAB — ECHOCARDIOGRAM COMPLETE
AR max vel: 1.22 cm2
AV Peak grad: 7.5 mmHg
Ao pk vel: 1.37 m/s
Area-P 1/2: 4.86 cm2
Height: 62 in
S' Lateral: 2.42 cm
Weight: 2240 [oz_av]

## 2021-01-02 MED FILL — METOPROL SUC 50MG ER TAB: 90 days supply | Qty: 180

## 2021-01-04 ENCOUNTER — Other Ambulatory Visit: Payer: Self-pay

## 2021-01-04 ENCOUNTER — Inpatient Hospital Stay: Payer: BC Managed Care – PPO

## 2021-01-04 ENCOUNTER — Ambulatory Visit: Payer: BC Managed Care – PPO | Admitting: Oncology

## 2021-01-04 ENCOUNTER — Other Ambulatory Visit: Payer: BC Managed Care – PPO

## 2021-01-04 ENCOUNTER — Ambulatory Visit: Payer: BC Managed Care – PPO

## 2021-01-04 ENCOUNTER — Inpatient Hospital Stay (HOSPITAL_BASED_OUTPATIENT_CLINIC_OR_DEPARTMENT_OTHER): Payer: BC Managed Care – PPO | Admitting: Oncology

## 2021-01-04 ENCOUNTER — Encounter: Payer: Self-pay | Admitting: Oncology

## 2021-01-04 VITALS — BP 115/72 | HR 85 | Temp 97.9°F | Resp 16 | Wt 141.9 lb

## 2021-01-04 VITALS — BP 127/64 | HR 89 | Resp 18

## 2021-01-04 DIAGNOSIS — Z79899 Other long term (current) drug therapy: Secondary | ICD-10-CM | POA: Diagnosis not present

## 2021-01-04 DIAGNOSIS — C9000 Multiple myeloma not having achieved remission: Secondary | ICD-10-CM | POA: Diagnosis not present

## 2021-01-04 DIAGNOSIS — D696 Thrombocytopenia, unspecified: Secondary | ICD-10-CM | POA: Diagnosis not present

## 2021-01-04 DIAGNOSIS — Z5112 Encounter for antineoplastic immunotherapy: Secondary | ICD-10-CM | POA: Diagnosis not present

## 2021-01-04 DIAGNOSIS — Z9221 Personal history of antineoplastic chemotherapy: Secondary | ICD-10-CM | POA: Diagnosis not present

## 2021-01-04 LAB — CBC WITH DIFFERENTIAL/PLATELET
Abs Immature Granulocytes: 0.01 10*3/uL (ref 0.00–0.07)
Basophils Absolute: 0 10*3/uL (ref 0.0–0.1)
Basophils Relative: 0 %
Eosinophils Absolute: 0.3 10*3/uL (ref 0.0–0.5)
Eosinophils Relative: 7 %
HCT: 31.4 % — ABNORMAL LOW (ref 36.0–46.0)
Hemoglobin: 10.2 g/dL — ABNORMAL LOW (ref 12.0–15.0)
Immature Granulocytes: 0 %
Lymphocytes Relative: 10 %
Lymphs Abs: 0.4 10*3/uL — ABNORMAL LOW (ref 0.7–4.0)
MCH: 31.8 pg (ref 26.0–34.0)
MCHC: 32.5 g/dL (ref 30.0–36.0)
MCV: 97.8 fL (ref 80.0–100.0)
Monocytes Absolute: 0.5 10*3/uL (ref 0.1–1.0)
Monocytes Relative: 12 %
Neutro Abs: 2.8 10*3/uL (ref 1.7–7.7)
Neutrophils Relative %: 71 %
Platelets: 208 10*3/uL (ref 150–400)
RBC: 3.21 MIL/uL — ABNORMAL LOW (ref 3.87–5.11)
RDW: 15.7 % — ABNORMAL HIGH (ref 11.5–15.5)
WBC: 3.9 10*3/uL — ABNORMAL LOW (ref 4.0–10.5)
nRBC: 0 % (ref 0.0–0.2)

## 2021-01-04 LAB — COMPREHENSIVE METABOLIC PANEL
ALT: 18 U/L (ref 0–44)
AST: 22 U/L (ref 15–41)
Albumin: 3.5 g/dL (ref 3.5–5.0)
Alkaline Phosphatase: 133 U/L — ABNORMAL HIGH (ref 38–126)
Anion gap: 9 (ref 5–15)
BUN: 12 mg/dL (ref 8–23)
CO2: 25 mmol/L (ref 22–32)
Calcium: 8.3 mg/dL — ABNORMAL LOW (ref 8.9–10.3)
Chloride: 105 mmol/L (ref 98–111)
Creatinine, Ser: 0.92 mg/dL (ref 0.44–1.00)
GFR, Estimated: >60 mL/min (ref >60)
Glucose, Bld: 145 mg/dL — ABNORMAL HIGH (ref 70–99)
Potassium: 4.2 mmol/L (ref 3.5–5.1)
Sodium: 139 mmol/L (ref 135–145)
Total Bilirubin: 0.6 mg/dL (ref 0.3–1.2)
Total Protein: 5.9 g/dL — ABNORMAL LOW (ref 6.5–8.1)

## 2021-01-04 MED ORDER — SODIUM CHLORIDE 0.9% FLUSH
10.0000 mL | Freq: Once | INTRAVENOUS | Status: AC
  Administered 2021-01-04: 10 mL via INTRAVENOUS
  Filled 2021-01-04: qty 10

## 2021-01-04 MED ORDER — DIPHENHYDRAMINE HCL 25 MG PO CAPS
25.0000 mg | ORAL_CAPSULE | Freq: Once | ORAL | Status: AC
  Administered 2021-01-04: 25 mg via ORAL
  Filled 2021-01-04: qty 1

## 2021-01-04 MED ORDER — BORTEZOMIB CHEMO SQ INJECTION 3.5 MG (2.5MG/ML)
1.3000 mg/m2 | Freq: Once | INTRAMUSCULAR | Status: AC
  Administered 2021-01-04: 2.25 mg via SUBCUTANEOUS
  Filled 2021-01-04: qty 0.9

## 2021-01-04 MED ORDER — DARATUMUMAB-HYALURONIDASE-FIHJ 1800-30000 MG-UT/15ML ~~LOC~~ SOLN
1800.0000 mg | Freq: Once | SUBCUTANEOUS | Status: AC
  Administered 2021-01-04: 1800 mg via SUBCUTANEOUS
  Filled 2021-01-04: qty 15

## 2021-01-04 MED ORDER — ACETAMINOPHEN 325 MG PO TABS
650.0000 mg | ORAL_TABLET | Freq: Once | ORAL | Status: AC
  Administered 2021-01-04: 650 mg via ORAL
  Filled 2021-01-04: qty 2

## 2021-01-04 MED ORDER — DEXAMETHASONE 4 MG PO TABS
20.0000 mg | ORAL_TABLET | Freq: Once | ORAL | Status: AC
  Administered 2021-01-04: 20 mg via ORAL
  Filled 2021-01-04: qty 5

## 2021-01-04 MED ORDER — HEPARIN SOD (PORK) LOCK FLUSH 100 UNIT/ML IV SOLN
INTRAVENOUS | Status: AC
  Administered 2021-01-04: 500 [IU] via INTRAVENOUS
  Filled 2021-01-04: qty 5

## 2021-01-04 MED ORDER — ZOLEDRONIC ACID 4 MG/5ML IV CONC
3.0000 mg | Freq: Once | INTRAVENOUS | Status: AC
  Administered 2021-01-04: 3 mg via INTRAVENOUS
  Filled 2021-01-04: qty 3.75

## 2021-01-04 MED ORDER — SODIUM CHLORIDE 0.9 % IV SOLN
Freq: Once | INTRAVENOUS | Status: AC
  Filled 2021-01-04: qty 250

## 2021-01-04 MED ORDER — HEPARIN SOD (PORK) LOCK FLUSH 100 UNIT/ML IV SOLN
500.0000 [IU] | Freq: Once | INTRAVENOUS | Status: AC
  Filled 2021-01-04: qty 5

## 2021-01-04 MED FILL — MORPHINE ER 15MG/12 TAB: 30 days supply | Qty: 120

## 2021-01-04 NOTE — Progress Notes (Signed)
Pt seen in ER for dizziness, fall and UTI. Pt states she had orthostatic hypotension. Pt c/o lower back pain

## 2021-01-04 NOTE — Patient Instructions (Signed)
[facility]   Discharge Instructions: Thank you for choosing [facility] to provide your oncology and hematology care.  If you have a lab appointment with the [facility], please go directly to the [facility] and check in at the registration area.  Wear comfortable clothing and clothing appropriate for easy access to any Portacath or PICC line.   We strive to give you quality time with your provider. You may need to reschedule your appointment if you arrive late (15 or more minutes).  Arriving late affects you and other patients whose appointments are after yours.  Also, if you miss three or more appointments without notifying the office, you may be dismissed from the clinic at the provider's discretion.      For prescription refill requests, have your pharmacy contact our office and allow 72 hours for refills to be completed.    Today you received the following chemotherapy and/or immunotherapy agents: Velcade and Darzalex Faspro. Today you also received the following: Zometa.      To help prevent nausea and vomiting after your treatment, we encourage you to take your nausea medication as directed.  BELOW ARE SYMPTOMS THAT SHOULD BE REPORTED IMMEDIATELY: *FEVER GREATER THAN 100.4 F (38 C) OR HIGHER *CHILLS OR SWEATING *NAUSEA AND VOMITING THAT IS NOT CONTROLLED WITH YOUR NAUSEA MEDICATION *UNUSUAL SHORTNESS OF BREATH *UNUSUAL BRUISING OR BLEEDING *URINARY PROBLEMS (pain or burning when urinating, or frequent urination) *BOWEL PROBLEMS (unusual diarrhea, constipation, pain near the anus) TENDERNESS IN MOUTH AND THROAT WITH OR WITHOUT PRESENCE OF ULCERS (sore throat, sores in mouth, or a toothache) UNUSUAL RASH, SWELLING OR PAIN  UNUSUAL VAGINAL DISCHARGE OR ITCHING   Items with * indicate a potential emergency and should be followed up as soon as possible or go to the Emergency Department if any problems should occur.  Please show  the CHEMOTHERAPY ALERT CARD or IMMUNOTHERAPY ALERT CARD at check-in to the Emergency Department and triage nurse.  Should you have questions after your visit or need to cancel or reschedule your appointment, please contact [facility]  [phone] and follow the prompts.  Office hours are 8:00 a.m. to 4:30 p.m. Monday - Friday. Please note that voicemails left after 4:00 p.m. may not be returned until the following business day.  We are closed weekends and major holidays. You have access to a nurse at all times for urgent questions. Please call the main number to the clinic [phone] and follow the prompts.  For any non-urgent questions, you may also contact your provider using MyChart. We now offer e-Visits for anyone 68 and older to request care online for non-urgent symptoms. For details visit [email or web address].   Also download the MyChart app! Go to the app store, search "MyChart", open the app, select [facility], and log in with your MyChart username and password.  Due to Covid, a mask is required upon entering the hospital/clinic. If you do not have a mask, one will be given to you upon arrival. For doctor visits, patients may have 1 support person aged 42 or older with them. For treatment visits, patients cannot have anyone with them due to current Covid guidelines and our immunocompromised population.

## 2021-01-04 NOTE — Progress Notes (Signed)
Calcium: 8.3. MD, Dr. Grayland Ormond, aware. Per MD order: proceed with scheduled Zometa today.

## 2021-01-06 ENCOUNTER — Telehealth: Payer: Self-pay | Admitting: Oncology

## 2021-01-06 LAB — BPAM RBC
Blood Product Expiration Date: 202212022359
ISSUE DATE / TIME: 202211050141
Unit Type and Rh: 5100

## 2021-01-06 LAB — TYPE AND SCREEN
ABO/RH(D): O POS
Antibody Screen: POSITIVE
Unit division: 0

## 2021-01-06 LAB — PREPARE RBC (CROSSMATCH)

## 2021-01-06 NOTE — Telephone Encounter (Signed)
Pt called wanting to change the time for her appt. On 11-15 to the morning. Please give her a call  at [phone]

## 2021-01-06 NOTE — Progress Notes (Signed)
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  INTERVAL HISTORY: Patient returns to clinic today for further evaluation and consideration of cycle 4, day 8 of DVRd.  She has had no more syncopal episodes or falls in the last week.  The ecchymosis surrounding her left eye is healing well.  She does not complain of shortness of breath or leg swelling today.  She only has minimal flank/rib pain.  She continues to have chronic weakness and fatigue.  She has no neurologic complaints.  She denies any recent fevers.  She has no chest pain, cough, or hemoptysis.  She denies any vomiting, constipation, or diarrhea.  She has no urinary complaints.  Patient offers no further specific complaints today. REVIEW OF SYSTEMS:   Review of Systems  Constitutional:  Positive for malaise/fatigue. Negative for fever and weight loss.  Respiratory: Negative.  Negative for cough, hemoptysis and shortness of breath.   Cardiovascular:  Negative for chest pain and leg swelling.  Gastrointestinal: Negative.  Negative for abdominal pain and nausea.  Genitourinary: Negative.  Negative for dysuria.  Musculoskeletal: Negative.  Negative for back pain and joint pain.  Skin: Negative.  Negative for rash.  Neurological:  Positive for weakness. Negative for dizziness, tremors, focal weakness and headaches.  Psychiatric/Behavioral: Negative.  The patient is not nervous/anxious.    As per HPI. Otherwise, a complete review of systems is negative.  PAST MEDICAL HISTORY: Past Medical History:  Diagnosis Date   Anxiety    Diabetes mellitus without complication ([place]) 1950   GERD (gastroesophageal reflux  disease)    Hyperlipidemia    Personal history of colonic polyps    Squamous cell carcinoma of skin 11/25/2013   Left chest. WD SCC with superficial infiltration.   Tachycardia     PAST SURGICAL HISTORY: Past Surgical History:  Procedure Laterality Date   AUGMENTATION MAMMAPLASTY Bilateral 9326   silicone and replacement in 2001   [place], [place], 1998,2003, 2008, 2013   COLONOSCOPY WITH PROPOFOL N/A 08/16/2016   Procedure: COLONOSCOPY WITH PROPOFOL;  Surgeon: Robert Bellow, MD;  Location: [facility] ENDOSCOPY;  Service: Endoscopy;  Laterality: N/A;   DILATION AND CURETTAGE OF UTERUS     ENDOMETRIAL ABLATION  12/1991   ganglion cyst removal      IR IMAGING GUIDED PORT INSERTION  10/27/2020   KYPHOPLASTY N/A 10/28/2020   Procedure: T12 and L3 KYPHOPLASTY;  Surgeon: Hessie Knows, MD;  Location: [facility] ORS;  Service: Orthopedics;  Laterality: N/A;   TONSILLECTOMY     WRIST SURGERY Right 05/1995    FAMILY HISTORY: Family History  Problem Relation Age of Onset   Colon polyps Sister    Colon cancer Father 17   Diabetes Mother    Breast cancer Neg Hx     ADVANCED DIRECTIVES (Y/N):  N  HEALTH MAINTENANCE: Social History   Tobacco Use   Smoking status: Former    Packs/day: 1.00    Years: 4.00    Pack years: 4.00    Types: Cigarettes    Quit date: 1982    Years since quitting: 40.8   Smokeless tobacco: Never  Substance Use Topics  Alcohol use: Yes    Alcohol/week: 1.0 - 2.0 standard drink    Types: 1 - 2 Glasses of wine per week    Comment: occassionally   Drug use: No     Colonoscopy:  PAP:  Bone density:  Lipid panel:  No Known Allergies  Current Outpatient Medications  Medication Sig Dispense Refill   acyclovir (ZOVIRAX) 400 MG tablet Take 1 tablet (400 mg total) by mouth 2 (two) times daily. 60 tablet 5   ASPIRIN 81 PO Take 81 mg by mouth daily.     atorvastatin (LIPITOR) 20 MG tablet Take  20 mg by mouth daily.     busPIRone (BUSPAR) 30 MG tablet Take 30 mg by mouth 2 (two) times daily.     CALCIUM-VITAMIN D PO Take 1 tablet by mouth daily.     cyclobenzaprine (FLEXERIL) 10 MG tablet Take 1 tablet (10 mg total) by mouth at bedtime. 30 tablet 2   dexamethasone (DECADRON) 4 MG tablet Take 5 tabs (20 mg) weekly the day after daratumumab for 12 weeks. Take with breakfast.     lenalidomide (REVLIMID) 10 MG capsule Take 1 capsule (10 mg total) by mouth daily. Take for 14 days, then hold for 7 days. 14 capsule 0   lidocaine-prilocaine (EMLA) cream Apply 1 application topically as needed. 30 g 2   liraglutide (VICTOZA) 18 MG/3ML SOPN Inject 1.2 mg into the skin daily.     meloxicam (MOBIC) 15 MG tablet Take 15 mg by mouth daily.     metFORMIN (GLUCOPHAGE) 500 MG tablet Take 1,000 mg by mouth 2 (two) times daily with a meal.     methocarbamol (ROBAXIN) 500 MG tablet Take 1 tablet (500 mg total) by mouth every 6 (six) hours as needed for muscle spasms. 40 tablet 1   metoprolol succinate (TOPROL-XL) 50 MG 24 hr tablet Take 50 mg by mouth 2 (two) times daily. Take with or immediately following a meal.     Misc Natural Products (GLUCOSAMINE CHOND CMP TRIPLE) TABS Take 1 tablet by mouth daily.     morphine (MS CONTIN) 15 MG 12 hr tablet TAKE 2 TABLETS BY MOUTH EVERY 12 HOURS 120 tablet 0   Multiple Vitamin (MULTIVITAMIN WITH MINERALS) TABS tablet Take 1 tablet by mouth daily.     omeprazole (PRILOSEC) 20 MG capsule Take 40 mg by mouth daily. Pt taking differently 40 mg instead of 20.     ondansetron (ZOFRAN) 8 MG tablet Take 1 tablet (8 mg total) by mouth 2 (two) times daily as needed (Nausea or vomiting). 60 tablet 2   oxyCODONE-acetaminophen (PERCOCET/ROXICET) 5-325 MG tablet TAKE 1-2 TABLETS BY MOUTH EVERY 4 HOURS AS NEEDED FOR SEVERE PAIN 90 tablet 0   polyethylene glycol (MIRALAX / GLYCOLAX) 17 g packet Take by mouth daily as needed for mild constipation.     prochlorperazine (COMPAZINE) 10  MG tablet Take 1 tablet (10 mg total) by mouth every 6 (six) hours as needed (Nausea or vomiting). 60 tablet 2   traMADol (ULTRAM) 50 MG tablet TAKE 1 TABLET BY MOUTH TWICE DAILY AS NEEDED FOR MODERATE PAIN 60 tablet 1   cefUROXime (CEFTIN) 500 MG tablet Take 500 mg by mouth 2 (two) times daily. (Patient not taking: Reported on 01/11/2021)     No current facility-administered medications for this visit.   Facility-Administered Medications Ordered in Other Visits  Medication Dose Route Frequency Provider Last Rate Last Admin   sodium chloride flush (NS) 0.9 % injection 10 mL  10  mL Intravenous PRN Lloyd Huger, MD   10 mL at 11/23/20 0932    OBJECTIVE: Vitals:   01/11/21 1024  BP: 132/83  Pulse: 90  Resp: 16  Temp: (!) 96.9 F (36.1 C)  SpO2: 98%     Body mass index is 25.94 kg/m.    ECOG FS:1 - Symptomatic but completely ambulatory  General: Well-developed, well-nourished, no acute distress. Eyes: Pink conjunctiva, anicteric sclera. HEENT: Normocephalic, moist mucous membranes. Lungs: No audible wheezing or coughing. Heart: Regular rate and rhythm. Abdomen: Soft, nontender, no obvious distention. Musculoskeletal: No edema, cyanosis, or clubbing. Neuro: Alert, answering all questions appropriately. Cranial nerves grossly intact. Skin: No rashes or petechiae noted. Psych: Normal affect.  LAB RESULTS:  Lab Results  Component Value Date   NA 139 01/11/2021   K 3.7 01/11/2021   CL 105 01/11/2021   CO2 25 01/11/2021   GLUCOSE 116 (H) 01/11/2021   BUN 12 01/11/2021   CREATININE 0.87 01/11/2021   CALCIUM 8.0 (L) 01/11/2021   PROT 6.1 (L) 01/11/2021   ALBUMIN 3.5 01/11/2021   AST 21 01/11/2021   ALT 16 01/11/2021   ALKPHOS 114 01/11/2021   BILITOT 0.3 01/11/2021   GFRNONAA >60 01/11/2021    Lab Results  Component Value Date   WBC 3.7 (L) 01/11/2021   NEUTROABS 2.6 01/11/2021   HGB 9.9 (L) 01/11/2021   HCT 29.5 (L) 01/11/2021   MCV 96.7 01/11/2021   PLT 99  (L) 01/11/2021     STUDIES: DG Chest 2 View  Result Date: 12/31/2020 CLINICAL DATA:  Syncope. EXAM: CHEST - 2 VIEW COMPARISON:  None. FINDINGS: A right-sided venous Port-A-Cath is seen with its distal tip noted at the junction of the superior vena cava and right atrium. Mild, diffuse, chronic appearing increased lung markings are noted. There is no evidence of acute infiltrate, pleural effusion or pneumothorax. The heart size and mediastinal contours are within normal limits. Evidence of prior vertebroplasty is seen at the level of T12. IMPRESSION: No evidence of acute or active cardiopulmonary disease. Electronically Signed   By: Virgina Norfolk M.D.   On: 12/31/2020 16:02   CT Head Wo Contrast  Result Date: 12/31/2020 CLINICAL DATA:  History of multiple myeloma with recent syncopal episode and fall, headaches and neck pain, initial encounter EXAM: CT HEAD WITHOUT CONTRAST CT CERVICAL SPINE WITHOUT CONTRAST TECHNIQUE: Multidetector CT imaging of the head and cervical spine was performed following the standard protocol without intravenous contrast. Multiplanar CT image reconstructions of the cervical spine were also generated. COMPARISON:  None. FINDINGS: CT HEAD FINDINGS Brain: No evidence of acute infarction, hemorrhage, hydrocephalus, extra-axial collection or mass lesion/mass effect. Vascular: No hyperdense vessel or unexpected calcification. Skull: Scattered lucencies are noted within the skull consistent with the given clinical history of multiple myeloma. Sinuses/Orbits: No acute finding. Other: None CT CERVICAL SPINE FINDINGS Alignment: Mild anterolisthesis of C3 on C4, C4 on C5 and C5 on C6 of a degenerative nature. Skull base and vertebrae: 7 cervical segments are well visualized. No compression deformities are seen. Mild osteophytic changes are noted. Degenerative facet hypertrophic changes are seen. Scattered mild lucencies are noted similar to that seen in the skull consistent with the  given clinical history. No acute fracture or acute facet abnormality is noted. Soft tissues and spinal canal: Right-sided chest wall port is noted. Surrounding soft tissues are within normal limits. Upper chest: Visualized lung apices are unremarkable. Other: None IMPRESSION: CT of the head: No acute intracranial abnormality noted. Multiple skull  lucencies are noted consistent with the given clinical history of multiple myeloma. CT of the cervical spine: Degenerative change without acute abnormality. Mild lucency is consistent with the given clinical history of multiple myeloma. Electronically Signed   By: Inez Catalina M.D.   On: 12/31/2020 22:02   CT Angio Chest Pulmonary Embolism (PE) W or WO Contrast  Result Date: 01/01/2021 CLINICAL DATA:  Shortness of breath and recent syncopal episode, initial encounter EXAM: CT ANGIOGRAPHY CHEST WITH CONTRAST TECHNIQUE: Multidetector CT imaging of the chest was performed using the standard protocol during bolus administration of intravenous contrast. Multiplanar CT image reconstructions and MIPs were obtained to evaluate the vascular anatomy. CONTRAST:  65m OMNIPAQUE IOHEXOL 350 MG/ML SOLN COMPARISON:  08/03/2020 FINDINGS: Cardiovascular: Thoracic aorta is well visualized without aneurysmal dilatation or focal dissection. Pulmonary artery is well visualized without focal filling defect to suggest pulmonary embolism. No coronary calcifications or cardiac enlargement is seen. Right-sided chest wall port is noted in satisfactory position. Mediastinum/Nodes: Thoracic inlet is within normal limits. No sizable hilar or mediastinal adenopathy is noted. The esophagus as visualized is within normal limits. Lungs/Pleura: Lungs are well aerated bilaterally. No focal infiltrate or sizable effusion is seen. Upper Abdomen: Visualized upper abdomen is unremarkable. Musculoskeletal: Bilateral breast implants are noted. Multiple rib fractures with varying degrees of callus formation are  noted consistent with pathologic fractures from the patient's known multiple myeloma. These are noted bilaterally. Changes of prior vertebral augmentation are seen at T12. Areas of mixed lytic and sclerotic changes are noted consistent with the given clinical history. Mild vertebral body height loss is noted at T2, T4, T5 and T9 consistent with compression deformities. These have progressed slightly in the interval from the prior MRI from 10/19/2020 Review of the MIP images confirms the above findings. IMPRESSION: No evidence of pulmonary emboli. No focal infiltrate is seen. Changes of multiple myeloma with multiple pathologic fractures with varying degrees of callus formation. Additionally multiple thoracic compression deformities are seen which have progressed from prior exam from 10/19/2020. Electronically Signed   By: MInez CatalinaM.D.   On: 01/01/2021 00:31   CT Cervical Spine Wo Contrast  Result Date: 12/31/2020 CLINICAL DATA:  History of multiple myeloma with recent syncopal episode and fall, headaches and neck pain, initial encounter EXAM: CT HEAD WITHOUT CONTRAST CT CERVICAL SPINE WITHOUT CONTRAST TECHNIQUE: Multidetector CT imaging of the head and cervical spine was performed following the standard protocol without intravenous contrast. Multiplanar CT image reconstructions of the cervical spine were also generated. COMPARISON:  None. FINDINGS: CT HEAD FINDINGS Brain: No evidence of acute infarction, hemorrhage, hydrocephalus, extra-axial collection or mass lesion/mass effect. Vascular: No hyperdense vessel or unexpected calcification. Skull: Scattered lucencies are noted within the skull consistent with the given clinical history of multiple myeloma. Sinuses/Orbits: No acute finding. Other: None CT CERVICAL SPINE FINDINGS Alignment: Mild anterolisthesis of C3 on C4, C4 on C5 and C5 on C6 of a degenerative nature. Skull base and vertebrae: 7 cervical segments are well visualized. No compression  deformities are seen. Mild osteophytic changes are noted. Degenerative facet hypertrophic changes are seen. Scattered mild lucencies are noted similar to that seen in the skull consistent with the given clinical history. No acute fracture or acute facet abnormality is noted. Soft tissues and spinal canal: Right-sided chest wall port is noted. Surrounding soft tissues are within normal limits. Upper chest: Visualized lung apices are unremarkable. Other: None IMPRESSION: CT of the head: No acute intracranial abnormality noted. Multiple skull lucencies are noted  consistent with the given clinical history of multiple myeloma. CT of the cervical spine: Degenerative change without acute abnormality. Mild lucency is consistent with the given clinical history of multiple myeloma. Electronically Signed   By: Inez Catalina M.D.   On: 12/31/2020 22:02   ECHOCARDIOGRAM COMPLETE  Result Date: 01/02/2021    ECHOCARDIOGRAM REPORT   Patient Name:   [facility] Date of Exam: 01/01/2021 Medical Rec #:  [id number]     Height:       62.0 in Accession #:    [id number]    Weight:       140.0 lb Date of Birth:  1955/07/30     BSA:          1.643 m Patient Age:    65 years      BP:           124/77 mmHg Patient Gender: F             HR:           87 bpm. Exam Location:  [facility] Procedure: 2D Echo Indications:     Syncope R55  History:         Patient has no prior history of Echocardiogram examinations.  Sonographer:     Kathlen Brunswick RDCS Referring Phys:  [id number] Sharen Hones Diagnosing Phys: Kate Sable MD IMPRESSIONS  1. Left ventricular ejection fraction, by estimation, is 55 to 60%. The left ventricle has normal function. The left ventricle has no regional wall motion abnormalities. Left ventricular diastolic parameters were normal.  2. Right ventricular systolic function is normal. The right ventricular size is normal.  3. The mitral valve is normal in structure. No evidence of mitral valve regurgitation.  4. The aortic valve  is tricuspid. Aortic valve regurgitation is not visualized. FINDINGS  Left Ventricle: Left ventricular ejection fraction, by estimation, is 55 to 60%. The left ventricle has normal function. The left ventricle has no regional wall motion abnormalities. The left ventricular internal cavity size was normal in size. There is  no left ventricular hypertrophy. Left ventricular diastolic parameters were normal. Right Ventricle: The right ventricular size is normal. No increase in right ventricular wall thickness. Right ventricular systolic function is normal. Left Atrium: Left atrial size was normal in size. Right Atrium: Right atrial size was normal in size. Pericardium: There is no evidence of pericardial effusion. Mitral Valve: The mitral valve is normal in structure. No evidence of mitral valve regurgitation. Tricuspid Valve: The tricuspid valve is normal in structure. Tricuspid valve regurgitation is not demonstrated. Aortic Valve: The aortic valve is tricuspid. Aortic valve regurgitation is not visualized. Aortic valve peak gradient measures 7.5 mmHg. Pulmonic Valve: The pulmonic valve was normal in structure. Pulmonic valve regurgitation is not visualized. Aorta: The aortic root and ascending aorta are structurally normal, with no evidence of dilitation. Venous: The inferior vena cava was not well visualized. IAS/Shunts: No atrial level shunt detected by color flow Doppler.  LEFT VENTRICLE PLAX 2D LVIDd:         3.51 cm   Diastology LVIDs:         2.42 cm   LV e' medial:    7.94 cm/s LV PW:         0.92 cm   LV E/e' medial:  12.4 LV IVS:        0.78 cm   LV e' lateral:   9.46 cm/s LVOT diam:     1.70 cm   LV E/e' lateral: 10.4  LV SV:         31 LV SV Index:   19 LVOT Area:     2.27 cm  RIGHT VENTRICLE RV Basal diam:  2.80 cm RV S prime:     14.30 cm/s LEFT ATRIUM           Index        RIGHT ATRIUM           Index LA diam:      3.40 cm 2.07 cm/m   RA Area:     12.10 cm LA Vol (A2C): 10.7 ml 6.51 ml/m   RA  Volume:   27.40 ml  16.68 ml/m LA Vol (A4C): 25.0 ml 15.22 ml/m  AORTIC VALVE                 PULMONIC VALVE AV Area (Vmax): 1.22 cm     PV Vmax:       0.98 m/s AV Vmax:        137.00 cm/s  PV Peak grad:  3.8 mmHg AV Peak Grad:   7.5 mmHg LVOT Vmax:      73.80 cm/s LVOT Vmean:     48.400 cm/s LVOT VTI:       0.138 m  AORTA Ao Root diam: 2.40 cm Ao Asc diam:  2.60 cm MITRAL VALVE               TRICUSPID VALVE MV Area (PHT): 4.86 cm    TV Peak grad:   23.8 mmHg MV Decel Time: 156 msec    TV Vmax:        2.44 m/s MV E velocity: 98.50 cm/s MV A velocity: 85.70 cm/s  SHUNTS MV E/A ratio:  1.15        Systemic VTI:  0.14 m                            Systemic Diam: 1.70 cm Kate Sable MD Electronically signed by Kate Sable MD Signature Date/Time: 01/02/2021/1:23:33 PM    Final      ASSESSMENT:  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  PLAN:      1.  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  Initially her lambda free light chains were elevated at 432.1, but now are within normal limits at 12.1.  Immunoglobulins and SPEP are normal.  Bone marrow biopsy completed on August 26, 2020 revealed 25% plasma cells along with the deletion of 1p which is considered poor prognosis.  PET scan results from September 06, 2020 reviewed independently with 3 hypermetabolic lesions in right C5-6 facets, right iliac crest lesion, and L3 vertebral lesion.  After lengthy discussion with the patient, she agreed to pursue chemotherapy with daratumumab, Velcade, Revlimid, and dexamethasone followed by autologous bone marrow transplant.  Patient will receive weekly daratumumab for the first 3-4 cycles along with Velcade on days 1, 4, 8, and 11.  She will receive Revlimid on days 1 through 14 of a 21-day cycle.  Plan to do 4 cycles and then refer back to [facility] for autologous bone cell transplant.  Given patient's persistent thrombocytopenia, will discontinue day 15 which  is daratumumab only for all subsequent cycles.  Proceed with cycle 4, day 8 of treatment today.  Return to clinic on Friday for Velcade.  Patient has a repeat bone marrow biopsy scheduled for January 24, 2021.  She will  have a video assisted telemedicine visit approximately 1 week after her biopsy to discuss the results at which time, treatment will likely be taken over by [facility] transplant team until her post transplant care which she will likely require maintenance treatments.   2.  Elevated liver enzymes: Resolved.   3.  Pain: Improved.  Patient has now completed XRT.  She had kyphoplasty x2.  Better controlled on 30 mg MS Contin every 12 hours.  Patient continues to take Percocet as needed sparingly.  4.  Hypercalcemia: Resolved.  Calcium is 8.0 today.  She last received Zometa on January 04, 2021. 5.  Renal insufficiency: Resolved.   6.  Leukopenia: Chronic and unchanged, monitor. 7.  Anemia: Chronic and unchanged.  Patient received 1 unit of packed red blood cells in the ER recently.  Patient's hemoglobin is 9.9. 8.  Thrombocytopenia: Mild.  Proceed with treatment as above.  Discontinue day 15 daratumumab with all subsequent cycles. 9.  Syncopal episode/shortness of breath/peripheral edema: Possibly underlying cardiac issues.  Patient's most recent echo on January 01, 2021 reported an EF of 55 to 60%.  Appreciate cardiology input.  Patient reports she has a cardiac MRI scheduled at [facility] on January 26, 2021.  Patient expressed understanding and was in agreement with this plan. She also understands that She can call clinic at any time with any questions, concerns, or complaints.   Cancer Staging Lambda light chain myeloma ([place] [facility]) Staging form: Plasma Cell Myeloma and Plasma Cell Disorders, AJCC 8th Edition - Clinical stage from 09/16/2020: RISS Stage I (Beta-2-microglobulin (mg/L): 2.5, Albumin (g/dL): 4.9, ISS: Stage I, High-risk cytogenetics: Absent, LDH: Normal) - Signed by Lloyd Huger, MD on 09/16/2020 Beta 2 microglobulin range (mg/L): Less than 3.5 Albumin range (g/dL): Greater than or equal to 3.5 Cytogenetics: 1p deletion  Lloyd Huger, MD   01/11/2021 1:30 PM

## 2021-01-07 ENCOUNTER — Other Ambulatory Visit: Payer: Self-pay

## 2021-01-07 ENCOUNTER — Inpatient Hospital Stay: Payer: BC Managed Care – PPO

## 2021-01-07 VITALS — BP 119/54 | HR 84 | Temp 96.2°F | Resp 18

## 2021-01-07 DIAGNOSIS — C9000 Multiple myeloma not having achieved remission: Secondary | ICD-10-CM

## 2021-01-07 DIAGNOSIS — Z5112 Encounter for antineoplastic immunotherapy: Secondary | ICD-10-CM | POA: Diagnosis not present

## 2021-01-07 MED ORDER — BORTEZOMIB CHEMO SQ INJECTION 3.5 MG (2.5MG/ML)
1.3000 mg/m2 | Freq: Once | INTRAMUSCULAR | Status: AC
  Administered 2021-01-07: 2.25 mg via SUBCUTANEOUS
  Filled 2021-01-07: qty 0.9

## 2021-01-07 MED ORDER — PROCHLORPERAZINE MALEATE 10 MG PO TABS
10.0000 mg | ORAL_TABLET | Freq: Once | ORAL | Status: AC
  Administered 2021-01-07: 10 mg via ORAL
  Filled 2021-01-07: qty 1

## 2021-01-09 MED FILL — METHOCARBAM 500MG TAB: 10 days supply | Qty: 40

## 2021-01-09 MED FILL — BUSPIRONE 30MG TAB: 30 days supply | Qty: 60

## 2021-01-10 DIAGNOSIS — I4711 Inappropriate sinus tachycardia, so stated: Secondary | ICD-10-CM | POA: Insufficient documentation

## 2021-01-10 DIAGNOSIS — R000 Tachycardia, unspecified: Secondary | ICD-10-CM | POA: Insufficient documentation

## 2021-01-11 ENCOUNTER — Other Ambulatory Visit: Payer: Self-pay

## 2021-01-11 ENCOUNTER — Inpatient Hospital Stay: Payer: BC Managed Care – PPO

## 2021-01-11 ENCOUNTER — Inpatient Hospital Stay: Payer: BC Managed Care – PPO | Admitting: Oncology

## 2021-01-11 ENCOUNTER — Inpatient Hospital Stay (HOSPITAL_BASED_OUTPATIENT_CLINIC_OR_DEPARTMENT_OTHER): Payer: BC Managed Care – PPO | Admitting: Oncology

## 2021-01-11 VITALS — BP 124/67 | HR 86 | Temp 96.5°F | Resp 17

## 2021-01-11 VITALS — BP 132/83 | HR 90 | Temp 96.9°F | Resp 16 | Wt 141.8 lb

## 2021-01-11 DIAGNOSIS — C9000 Multiple myeloma not having achieved remission: Secondary | ICD-10-CM

## 2021-01-11 DIAGNOSIS — Z5112 Encounter for antineoplastic immunotherapy: Secondary | ICD-10-CM | POA: Diagnosis not present

## 2021-01-11 LAB — COMPREHENSIVE METABOLIC PANEL
ALT: 16 U/L (ref 0–44)
AST: 21 U/L (ref 15–41)
Albumin: 3.5 g/dL (ref 3.5–5.0)
Alkaline Phosphatase: 114 U/L (ref 38–126)
Anion gap: 9 (ref 5–15)
BUN: 12 mg/dL (ref 8–23)
CO2: 25 mmol/L (ref 22–32)
Calcium: 8 mg/dL — ABNORMAL LOW (ref 8.9–10.3)
Chloride: 105 mmol/L (ref 98–111)
Creatinine, Ser: 0.87 mg/dL (ref 0.44–1.00)
GFR, Estimated: >60 mL/min (ref >60)
Glucose, Bld: 116 mg/dL — ABNORMAL HIGH (ref 70–99)
Potassium: 3.7 mmol/L (ref 3.5–5.1)
Sodium: 139 mmol/L (ref 135–145)
Total Bilirubin: 0.3 mg/dL (ref 0.3–1.2)
Total Protein: 6.1 g/dL — ABNORMAL LOW (ref 6.5–8.1)

## 2021-01-11 LAB — CBC WITH DIFFERENTIAL/PLATELET
Abs Immature Granulocytes: 0 10*3/uL (ref 0.00–0.07)
Basophils Absolute: 0 10*3/uL (ref 0.0–0.1)
Basophils Relative: 0 %
Eosinophils Absolute: 0.4 10*3/uL (ref 0.0–0.5)
Eosinophils Relative: 11 %
HCT: 29.5 % — ABNORMAL LOW (ref 36.0–46.0)
Hemoglobin: 9.9 g/dL — ABNORMAL LOW (ref 12.0–15.0)
Immature Granulocytes: 0 %
Lymphocytes Relative: 10 %
Lymphs Abs: 0.4 10*3/uL — ABNORMAL LOW (ref 0.7–4.0)
MCH: 32.5 pg (ref 26.0–34.0)
MCHC: 33.6 g/dL (ref 30.0–36.0)
MCV: 96.7 fL (ref 80.0–100.0)
Monocytes Absolute: 0.3 10*3/uL (ref 0.1–1.0)
Monocytes Relative: 7 %
Neutro Abs: 2.6 10*3/uL (ref 1.7–7.7)
Neutrophils Relative %: 72 %
Platelets: 99 10*3/uL — ABNORMAL LOW (ref 150–400)
RBC: 3.05 MIL/uL — ABNORMAL LOW (ref 3.87–5.11)
RDW: 16 % — ABNORMAL HIGH (ref 11.5–15.5)
WBC: 3.7 10*3/uL — ABNORMAL LOW (ref 4.0–10.5)
nRBC: 0.5 % — ABNORMAL HIGH (ref 0.0–0.2)

## 2021-01-11 MED ORDER — DARATUMUMAB-HYALURONIDASE-FIHJ 1800-30000 MG-UT/15ML ~~LOC~~ SOLN
1800.0000 mg | Freq: Once | SUBCUTANEOUS | Status: AC
  Administered 2021-01-11: 1800 mg via SUBCUTANEOUS
  Filled 2021-01-11: qty 15

## 2021-01-11 MED ORDER — ACETAMINOPHEN 325 MG PO TABS
650.0000 mg | ORAL_TABLET | Freq: Once | ORAL | Status: AC
  Administered 2021-01-11: 650 mg via ORAL
  Filled 2021-01-11: qty 2

## 2021-01-11 MED ORDER — HEPARIN SOD (PORK) LOCK FLUSH 100 UNIT/ML IV SOLN
INTRAVENOUS | Status: AC
  Administered 2021-01-11: 500 [IU]
  Filled 2021-01-11: qty 5

## 2021-01-11 MED ORDER — BORTEZOMIB CHEMO SQ INJECTION 3.5 MG (2.5MG/ML)
1.3000 mg/m2 | Freq: Once | INTRAMUSCULAR | Status: AC
  Administered 2021-01-11: 2.25 mg via SUBCUTANEOUS
  Filled 2021-01-11: qty 0.9

## 2021-01-11 MED ORDER — DIPHENHYDRAMINE HCL 25 MG PO CAPS
25.0000 mg | ORAL_CAPSULE | Freq: Once | ORAL | Status: AC
  Administered 2021-01-11: 25 mg via ORAL
  Filled 2021-01-11: qty 1

## 2021-01-11 MED ORDER — DEXAMETHASONE 4 MG PO TABS
20.0000 mg | ORAL_TABLET | Freq: Once | ORAL | Status: AC
  Administered 2021-01-11: 20 mg via ORAL
  Filled 2021-01-11: qty 5

## 2021-01-11 NOTE — Patient Instructions (Signed)
[facility]  Discharge Instructions: Thank you for choosing [facility] to provide your oncology and hematology care.  If you have a lab appointment with the [facility], please go directly to the [facility] and check in at the registration area.  Wear comfortable clothing and clothing appropriate for easy access to any Portacath or PICC line.   We strive to give you quality time with your provider. You may need to reschedule your appointment if you arrive late (15 or more minutes).  Arriving late affects you and other patients whose appointments are after yours.  Also, if you miss three or more appointments without notifying the office, you may be dismissed from the clinic at the provider's discretion.      For prescription refill requests, have your pharmacy contact our office and allow 72 hours for refills to be completed.    Today you received the following chemotherapy and/or immunotherapy agents Darzalex and Velcade      To help prevent nausea and vomiting after your treatment, we encourage you to take your nausea medication as directed.  BELOW ARE SYMPTOMS THAT SHOULD BE REPORTED IMMEDIATELY: *FEVER GREATER THAN 100.4 F (38 C) OR HIGHER *CHILLS OR SWEATING *NAUSEA AND VOMITING THAT IS NOT CONTROLLED WITH YOUR NAUSEA MEDICATION *UNUSUAL SHORTNESS OF BREATH *UNUSUAL BRUISING OR BLEEDING *URINARY PROBLEMS (pain or burning when urinating, or frequent urination) *BOWEL PROBLEMS (unusual diarrhea, constipation, pain near the anus) TENDERNESS IN MOUTH AND THROAT WITH OR WITHOUT PRESENCE OF ULCERS (sore throat, sores in mouth, or a toothache) UNUSUAL RASH, SWELLING OR PAIN  UNUSUAL VAGINAL DISCHARGE OR ITCHING   Items with * indicate a potential emergency and should be followed up as soon as possible or go to the Emergency Department if any problems should occur.  Please show the CHEMOTHERAPY ALERT CARD or IMMUNOTHERAPY ALERT CARD at  check-in to the Emergency Department and triage nurse.  Should you have questions after your visit or need to cancel or reschedule your appointment, please contact [facility]  [phone] and follow the prompts.  Office hours are 8:00 a.m. to 4:30 p.m. Monday - Friday. Please note that voicemails left after 4:00 p.m. may not be returned until the following business day.  We are closed weekends and major holidays. You have access to a nurse at all times for urgent questions. Please call the main number to the clinic [phone] and follow the prompts.  For any non-urgent questions, you may also contact your provider using MyChart. We now offer e-Visits for anyone 30 and older to request care online for non-urgent symptoms. For details visit [email or web address].   Also download the MyChart app! Go to the app store, search "MyChart", open the app, select [facility], and log in with your MyChart username and password.  Due to Covid, a mask is required upon entering the hospital/clinic. If you do not have a mask, one will be given to you upon arrival. For doctor visits, patients may have 1 support person aged 79 or older with them. For treatment visits, patients cannot have anyone with them due to current Covid guidelines and our immunocompromised population.

## 2021-01-11 NOTE — Progress Notes (Signed)
Platelets 99, Per Dr, Grayland Ormond proceed with Darzalex/velcade treatment at this time. Per Dr. Grayland Ormond okay to proceed with 01/14/21 Velcade treatment using today's (01/11/21) labs, No additional labs required.    1235: Pt monitored post Darzalex injection per MD order. PT and VS stable, Injection site WNL. Pt stable at discharge.

## 2021-01-11 NOTE — Progress Notes (Signed)
Urinary symptoms have resolved since last visit. Pt c/o tingling in hands/feet/ankles as well as lower back pain

## 2021-01-12 LAB — KAPPA/LAMBDA LIGHT CHAINS
Kappa free light chain: 8.8 mg/L (ref 3.3–19.4)
Kappa, lambda light chain ratio: 1.28 (ref 0.26–1.65)
Lambda free light chains: 6.9 mg/L (ref 5.7–26.3)

## 2021-01-14 ENCOUNTER — Other Ambulatory Visit: Payer: Self-pay

## 2021-01-14 ENCOUNTER — Inpatient Hospital Stay: Payer: BC Managed Care – PPO

## 2021-01-14 VITALS — BP 142/77 | HR 97 | Temp 97.1°F | Resp 18 | Wt 140.4 lb

## 2021-01-14 DIAGNOSIS — C9000 Multiple myeloma not having achieved remission: Secondary | ICD-10-CM

## 2021-01-14 DIAGNOSIS — Z5112 Encounter for antineoplastic immunotherapy: Secondary | ICD-10-CM | POA: Diagnosis not present

## 2021-01-14 LAB — IRON AND TIBC
Iron: 60 ug/dL (ref 28–170)
Saturation Ratios: 18 % (ref 10.4–31.8)
TIBC: 333 ug/dL (ref 250–450)
UIBC: 273 ug/dL

## 2021-01-14 LAB — CBC WITH DIFFERENTIAL/PLATELET
Abs Immature Granulocytes: 0.01 10*3/uL (ref 0.00–0.07)
Basophils Absolute: 0 10*3/uL (ref 0.0–0.1)
Basophils Relative: 0 %
Eosinophils Absolute: 0.2 10*3/uL (ref 0.0–0.5)
Eosinophils Relative: 5 %
HCT: 29.9 % — ABNORMAL LOW (ref 36.0–46.0)
Hemoglobin: 9.8 g/dL — ABNORMAL LOW (ref 12.0–15.0)
Immature Granulocytes: 0 %
Lymphocytes Relative: 11 %
Lymphs Abs: 0.5 10*3/uL — ABNORMAL LOW (ref 0.7–4.0)
MCH: 32.1 pg (ref 26.0–34.0)
MCHC: 32.8 g/dL (ref 30.0–36.0)
MCV: 98 fL (ref 80.0–100.0)
Monocytes Absolute: 0.6 10*3/uL (ref 0.1–1.0)
Monocytes Relative: 14 %
Neutro Abs: 3.1 10*3/uL (ref 1.7–7.7)
Neutrophils Relative %: 70 %
Platelets: 58 10*3/uL — ABNORMAL LOW (ref 150–400)
RBC: 3.05 MIL/uL — ABNORMAL LOW (ref 3.87–5.11)
RDW: 16.2 % — ABNORMAL HIGH (ref 11.5–15.5)
WBC: 4.4 10*3/uL (ref 4.0–10.5)
nRBC: 0 % (ref 0.0–0.2)

## 2021-01-14 LAB — FERRITIN: Ferritin: 105 ng/mL (ref 11–307)

## 2021-01-14 MED ORDER — HEPARIN SOD (PORK) LOCK FLUSH 100 UNIT/ML IV SOLN
INTRAVENOUS | Status: AC
  Administered 2021-01-14: 500 [IU]
  Filled 2021-01-14: qty 5

## 2021-01-14 MED ORDER — BORTEZOMIB CHEMO SQ INJECTION 3.5 MG (2.5MG/ML)
1.3000 mg/m2 | Freq: Once | INTRAMUSCULAR | Status: AC
  Administered 2021-01-14: 2.25 mg via SUBCUTANEOUS
  Filled 2021-01-14: qty 0.9

## 2021-01-14 MED ORDER — PROCHLORPERAZINE MALEATE 10 MG PO TABS
10.0000 mg | ORAL_TABLET | Freq: Once | ORAL | Status: AC
  Administered 2021-01-14: 10 mg via ORAL
  Filled 2021-01-14: qty 1

## 2021-01-14 NOTE — Patient Instructions (Signed)
[facility]  Discharge Instructions: Thank you for choosing [facility] to provide your oncology and hematology care.  If you have a lab appointment with the [facility], please go directly to the [facility] and check in at the registration area.  Wear comfortable clothing and clothing appropriate for easy access to any Portacath or PICC line.   We strive to give you quality time with your provider. You may need to reschedule your appointment if you arrive late (15 or more minutes).  Arriving late affects you and other patients whose appointments are after yours.  Also, if you miss three or more appointments without notifying the office, you may be dismissed from the clinic at the provider's discretion.      For prescription refill requests, have your pharmacy contact our office and allow 72 hours for refills to be completed.    Today you received the following chemotherapy and/or immunotherapy agents VELCADE      To help prevent nausea and vomiting after your treatment, we encourage you to take your nausea medication as directed.  BELOW ARE SYMPTOMS THAT SHOULD BE REPORTED IMMEDIATELY: *FEVER GREATER THAN 100.4 F (38 C) OR HIGHER *CHILLS OR SWEATING *NAUSEA AND VOMITING THAT IS NOT CONTROLLED WITH YOUR NAUSEA MEDICATION *UNUSUAL SHORTNESS OF BREATH *UNUSUAL BRUISING OR BLEEDING *URINARY PROBLEMS (pain or burning when urinating, or frequent urination) *BOWEL PROBLEMS (unusual diarrhea, constipation, pain near the anus) TENDERNESS IN MOUTH AND THROAT WITH OR WITHOUT PRESENCE OF ULCERS (sore throat, sores in mouth, or a toothache) UNUSUAL RASH, SWELLING OR PAIN  UNUSUAL VAGINAL DISCHARGE OR ITCHING   Items with * indicate a potential emergency and should be followed up as soon as possible or go to the Emergency Department if any problems should occur.  Please show the CHEMOTHERAPY ALERT CARD or IMMUNOTHERAPY ALERT CARD at check-in to  the Emergency Department and triage nurse.  Should you have questions after your visit or need to cancel or reschedule your appointment, please contact [facility]  [phone] and follow the prompts.  Office hours are 8:00 a.m. to 4:30 p.m. Monday - Friday. Please note that voicemails left after 4:00 p.m. may not be returned until the following business day.  We are closed weekends and major holidays. You have access to a nurse at all times for urgent questions. Please call the main number to the clinic [phone] and follow the prompts.  For any non-urgent questions, you may also contact your provider using MyChart. We now offer e-Visits for anyone 23 and older to request care online for non-urgent symptoms. For details visit [email or web address].   Also download the MyChart app! Go to the app store, search "MyChart", open the app, select [facility], and log in with your MyChart username and password.  Due to Covid, a mask is required upon entering the hospital/clinic. If you do not have a mask, one will be given to you upon arrival. For doctor visits, patients may have 1 support person aged 74 or older with them. For treatment visits, patients cannot have anyone with them due to current Covid guidelines and our immunocompromised population.   Bortezomib injection What is this medication? BORTEZOMIB (bor TEZ oh mib) targets proteins in cancer cells and stops the cancer cells from growing. It treats multiple myeloma and mantle cell lymphoma. This medicine may be used for other purposes; ask your health care provider or pharmacist if you have questions. COMMON BRAND NAME(S): Velcade What  should I tell my care team before I take this medication? They need to know if you have any of these conditions: dehydration diabetes (high blood sugar) heart disease liver disease tingling of the fingers or toes or other nerve disorder an unusual or allergic reaction  to bortezomib, mannitol, boron, other medicines, foods, dyes, or preservatives pregnant or trying to get pregnant breast-feeding How should I use this medication? This medicine is injected into a vein or under the skin. It is given by a health care provider in a hospital or clinic setting. Talk to your health care provider about the use of this medicine in children. Special care may be needed. Overdosage: If you think you have taken too much of this medicine contact a poison control center or emergency room at once. NOTE: This medicine is only for you. Do not share this medicine with others. What if I miss a dose? Keep appointments for follow-up doses. It is important not to miss your dose. Call your health care provider if you are unable to keep an appointment. What may interact with this medication? This medicine may interact with the following medications: ketoconazole rifampin This list may not describe all possible interactions. Give your health care provider a list of all the medicines, herbs, non-prescription drugs, or dietary supplements you use. Also tell them if you smoke, drink alcohol, or use illegal drugs. Some items may interact with your medicine. What should I watch for while using this medication? Your condition will be monitored carefully while you are receiving this medicine. You may need blood work done while you are taking this medicine. You may get drowsy or dizzy. Do not drive, use machinery, or do anything that needs mental alertness until you know how this medicine affects you. Do not stand up or sit up quickly, especially if you are an older patient. This reduces the risk of dizzy or fainting spells This medicine may increase your risk of getting an infection. Call your health care provider for advice if you get a fever, chills, sore throat, or other symptoms of a cold or flu. Do not treat yourself. Try to avoid being around people who are sick. Check with your health  care provider if you have severe diarrhea, nausea, and vomiting, or if you sweat a lot. The loss of too much body fluid may make it dangerous for you to take this medicine. Do not become pregnant while taking this medicine or for 7 months after stopping it. Women should inform their health care provider if they wish to become pregnant or think they might be pregnant. Men should not father a child while taking this medicine and for 4 months after stopping it. There is a potential for serious harm to an unborn child. Talk to your health care provider for more information. Do not breast-feed an infant while taking this medicine or for 2 months after stopping it. This medicine may make it more difficult to get pregnant or father a child. Talk to your health care provider if you are concerned about your fertility. What side effects may I notice from receiving this medication? Side effects that you should report to your doctor or health care professional as soon as possible: allergic reactions (skin rash; itching or hives; swelling of the face, lips, or tongue) bleeding (bloody or black, tarry stools; red or dark brown urine; spitting up blood or brown material that looks like coffee grounds; red spots on the skin; unusual bruising or bleeding from the eye,  gums, or nose) blurred vision or changes in vision confusion constipation headache heart failure (trouble breathing; fast, irregular heartbeat; sudden weight gain; swelling of the ankles, feet, hands) infection (fever, chills, cough, sore throat, pain or trouble passing urine) lack or loss of appetite liver injury (dark yellow or brown urine; general ill feeling or flu-like symptoms; loss of appetite, right upper belly pain; yellowing of the eyes or skin) low blood pressure (dizziness; feeling faint or lightheaded, falls; unusually weak or tired) muscle cramps pain, redness, or irritation at site where injected pain, tingling, numbness in the hands or  feet seizures trouble breathing unusual bruising or bleeding Side effects that usually do not require medical attention (report to your doctor or health care professional if they continue or are bothersome): diarrhea nausea stomach pain trouble sleeping vomiting This list may not describe all possible side effects. Call your doctor for medical advice about side effects. You may report side effects to FDA at 1-800-FDA-1088. Where should I keep my medication? This medicine is given in a hospital or clinic. It will not be stored at home. NOTE: This sheet is a summary. It may not cover all possible information. If you have questions about this medicine, talk to your doctor, pharmacist, or health care provider.  2022 Elsevier/Gold Standard (2020-02-05 00:00:00)  

## 2021-01-14 NOTE — Progress Notes (Signed)
Pt here for velcade. States she had multiple dizzy episodes yesterday. Ortho BP's done.  Beckey Rutter made aware- orders for labs received. Ok to proceed with Velcade.  PLTS 54.  Lauren made aware.  Ok to [employer] pt  Fall precautions reviewed with pt. Verbalized understanding.

## 2021-01-15 LAB — SAMPLE TO BLOOD BANK

## 2021-01-18 ENCOUNTER — Other Ambulatory Visit: Payer: Self-pay | Admitting: Radiology

## 2021-01-18 MED FILL — CYCLOBENZAPR 10MG TAB: 30 days supply | Qty: 30

## 2021-01-19 ENCOUNTER — Other Ambulatory Visit: Payer: Self-pay | Admitting: Emergency Medicine

## 2021-01-19 ENCOUNTER — Telehealth: Payer: Self-pay | Admitting: *Deleted

## 2021-01-19 DIAGNOSIS — C9000 Multiple myeloma not having achieved remission: Secondary | ICD-10-CM

## 2021-01-19 MED ORDER — LENALIDOMIDE 10 MG PO CAPS: 10.0000 mg | ORAL_CAPSULE | Freq: Every day | ORAL | 0 refills | Status: DC

## 2021-01-19 NOTE — Progress Notes (Signed)
Patient on schedule for BMB 01/24/2021, called and spoke with patient with pre procedure instructions given. Made aware to be here @ 0800, NPO after MN prior to procedure as well as driver post procedure/recovery/discharge. Stated understanding.

## 2021-01-19 NOTE — Telephone Encounter (Signed)
Revlimid prescription should have gone to specialty pharmacy not [facility]

## 2021-01-21 ENCOUNTER — Other Ambulatory Visit: Payer: Self-pay | Admitting: Radiology

## 2021-01-24 ENCOUNTER — Ambulatory Visit
Admission: RE | Admit: 2021-01-24 | Discharge: 2021-01-24 | Disposition: A | Discharge status: Home / Self Care | Payer: BC Managed Care – PPO | Source: Ambulatory Visit | Attending: Internal Medicine | Admitting: Internal Medicine

## 2021-01-24 ENCOUNTER — Other Ambulatory Visit: Payer: Self-pay

## 2021-01-24 DIAGNOSIS — C9000 Multiple myeloma not having achieved remission: Secondary | ICD-10-CM | POA: Insufficient documentation

## 2021-01-24 DIAGNOSIS — D539 Nutritional anemia, unspecified: Secondary | ICD-10-CM | POA: Diagnosis not present

## 2021-01-24 DIAGNOSIS — D72819 Decreased white blood cell count, unspecified: Secondary | ICD-10-CM | POA: Insufficient documentation

## 2021-01-24 LAB — CBC WITH DIFFERENTIAL/PLATELET
Abs Immature Granulocytes: 0.01 10*3/uL (ref 0.00–0.07)
Basophils Absolute: 0 10*3/uL (ref 0.0–0.1)
Basophils Relative: 1 %
Eosinophils Absolute: 0.2 10*3/uL (ref 0.0–0.5)
Eosinophils Relative: 5 %
HCT: 28.2 % — ABNORMAL LOW (ref 36.0–46.0)
Hemoglobin: 9.2 g/dL — ABNORMAL LOW (ref 12.0–15.0)
Immature Granulocytes: 0 %
Lymphocytes Relative: 10 %
Lymphs Abs: 0.4 10*3/uL — ABNORMAL LOW (ref 0.7–4.0)
MCH: 32.6 pg (ref 26.0–34.0)
MCHC: 32.6 g/dL (ref 30.0–36.0)
MCV: 100 fL (ref 80.0–100.0)
Monocytes Absolute: 0.5 10*3/uL (ref 0.1–1.0)
Monocytes Relative: 14 %
Neutro Abs: 2.6 10*3/uL (ref 1.7–7.7)
Neutrophils Relative %: 70 %
Platelets: 199 10*3/uL (ref 150–400)
RBC: 2.82 MIL/uL — ABNORMAL LOW (ref 3.87–5.11)
RDW: 16.1 % — ABNORMAL HIGH (ref 11.5–15.5)
WBC: 3.7 10*3/uL — ABNORMAL LOW (ref 4.0–10.5)
nRBC: 0 % (ref 0.0–0.2)

## 2021-01-24 MED ORDER — FENTANYL CITRATE (PF) 100 MCG/2ML IJ SOLN
INTRAMUSCULAR | Status: DC | PRN
  Administered 2021-01-24 (×2): 50 ug via INTRAVENOUS

## 2021-01-24 MED ORDER — FENTANYL CITRATE (PF) 100 MCG/2ML IJ SOLN
INTRAMUSCULAR | Status: AC
  Filled 2021-01-24: qty 2

## 2021-01-24 MED ORDER — HEPARIN SOD (PORK) LOCK FLUSH 100 UNIT/ML IV SOLN
500.0000 [IU] | Freq: Once | INTRAVENOUS | Status: AC
Start: 2021-01-24 — End: 2021-01-24
  Filled 2021-01-24: qty 5

## 2021-01-24 MED ORDER — HEPARIN SOD (PORK) LOCK FLUSH 100 UNIT/ML IV SOLN
INTRAVENOUS | Status: AC
  Administered 2021-01-24: 12:00:00 500 [IU] via INTRAVENOUS
  Filled 2021-01-24: qty 5

## 2021-01-24 MED ORDER — SODIUM CHLORIDE 0.9 % IV SOLN: INTRAVENOUS | Status: DC

## 2021-01-24 MED ORDER — MIDAZOLAM HCL 2 MG/2ML IJ SOLN
INTRAMUSCULAR | Status: AC
  Filled 2021-01-24: qty 2

## 2021-01-24 MED ORDER — MIDAZOLAM HCL 2 MG/2ML IJ SOLN
INTRAMUSCULAR | Status: DC | PRN
  Administered 2021-01-24 (×2): 1 mg via INTRAVENOUS

## 2021-01-24 MED FILL — ATORVASTATIN 20MG TAB: 30 days supply | Qty: 30

## 2021-01-24 MED FILL — MELOXICAM 15MG TAB: 30 days supply | Qty: 30

## 2021-01-24 MED FILL — ONDANSETRON 8MG TAB: 9 days supply | Qty: 18

## 2021-01-24 MED FILL — VICTOZA 3'S 18MG/3ML PEN: 30 days supply | Qty: 6

## 2021-01-24 MED FILL — ACYCLOVIR 400MG TAB: 30 days supply | Qty: 60

## 2021-01-24 NOTE — Procedures (Signed)
Interventional Radiology Procedure Note  Date of Procedure: 01/24/2021  Procedure: CT BMBx  Findings:  1. CT BMBx right posterior ilium    Complications: No immediate complications noted.   Estimated Blood Loss: minimal  Follow-up and Recommendations: 1. Bedrest 1 hour    Albin Felling, MD  Vascular & Interventional Radiology  01/24/2021 10:41 AM

## 2021-01-24 NOTE — H&P (Signed)
Interventional Radiology - Pre-procedure H&P    Referring Provider: Cammie Sickle, MD  Planned Procedure: CT guided BMBx    History of Present Illness  Hannah Mcdonald is a 65 y.o. female with a relevant past medical history of myeloma seen today for CT guided BMBx.  She has had one done before and tolerated it well.   Reports being in otherwise in usual state of health.    Additional Past Medical History Past Medical History:  Diagnosis Date   Anxiety    Diabetes mellitus without complication ([place]) 3299   GERD (gastroesophageal reflux disease)    Hyperlipidemia    Personal history of colonic polyps    Squamous cell carcinoma of skin 11/25/2013   Left chest. WD SCC with superficial infiltration.   Tachycardia     Surgical History  Past Surgical History:  Procedure Laterality Date   AUGMENTATION MAMMAPLASTY Bilateral 2426   silicone and replacement in 2001   [place], [place], 1998,2003, 2008, 2013   COLONOSCOPY WITH PROPOFOL N/A 08/16/2016   Procedure: COLONOSCOPY WITH PROPOFOL;  Surgeon: Robert Bellow, MD;  Location: [facility] ENDOSCOPY;  Service: Endoscopy;  Laterality: N/A;   DILATION AND CURETTAGE OF UTERUS     ENDOMETRIAL ABLATION  12/1991   ganglion cyst removal      IR IMAGING GUIDED PORT INSERTION  10/27/2020   KYPHOPLASTY N/A 10/28/2020   Procedure: T12 and L3 KYPHOPLASTY;  Surgeon: Hessie Knows, MD;  Location: [facility] ORS;  Service: Orthopedics;  Laterality: N/A;   TONSILLECTOMY     WRIST SURGERY Right 05/1995     Medications  I have reviewed the current medication list. Refer to chart for details. Current Outpatient Medications  Medication Instructions   acyclovir (ZOVIRAX) 400 mg, Oral, 2 times daily   ASPIRIN 81 PO 81 mg, Oral, Daily   atorvastatin (LIPITOR) 20 mg, Oral, Daily   busPIRone (BUSPAR) 30 mg, Oral, 2 times daily   CALCIUM-VITAMIN D PO 1 tablet, Oral, Daily    cefUROXime (CEFTIN) 500 mg, 2 times daily   cyclobenzaprine (FLEXERIL) 10 mg, Oral, Daily at bedtime   dexamethasone (DECADRON) 4 MG tablet Take 5 tabs (20 mg) weekly the day after daratumumab for 12 weeks. Take with breakfast.   lenalidomide (REVLIMID) 10 mg, Oral, Daily, Take for 14 days, then hold for 7 days.   lidocaine-prilocaine (EMLA) cream 1 application, Topical, As needed   liraglutide (VICTOZA) 1.2 mg, Subcutaneous, Daily   meloxicam (MOBIC) 15 mg, Oral, Daily   metFORMIN (GLUCOPHAGE) 1,000 mg, Oral, 2 times daily with meals   methocarbamol (ROBAXIN) 500 mg, Oral, Every 6 hours PRN   metoprolol succinate (TOPROL-XL) 50 mg, Oral, 2 times daily, Take with or immediately following a meal.    Misc Natural Products (GLUCOSAMINE CHOND CMP TRIPLE) TABS 1 tablet, Oral, Daily   morphine (MS CONTIN) 15 MG 12 hr tablet TAKE 2 TABLETS BY MOUTH EVERY 12 HOURS   Multiple Vitamin (MULTIVITAMIN WITH MINERALS) TABS tablet 1 tablet, Oral, Daily   omeprazole (PRILOSEC) 40 mg, Oral, Daily, Pt taking differently 40 mg instead of 20.   ondansetron (ZOFRAN) 8 mg, Oral, 2 times daily PRN   oxyCODONE-acetaminophen (PERCOCET/ROXICET) 5-325 MG tablet TAKE 1-2 TABLETS BY MOUTH EVERY 4 HOURS AS NEEDED FOR SEVERE PAIN   polyethylene glycol (MIRALAX / GLYCOLAX) 17 g packet Oral, Daily PRN   prochlorperazine (COMPAZINE) 10 mg, Oral, Every 6 hours PRN   traMADol (  ULTRAM) 50 MG tablet TAKE 1 TABLET BY MOUTH TWICE DAILY AS NEEDED FOR MODERATE PAIN     I have reviewed prior sedation medication usage: Yes    Allergies No Known Allergies Does patient have contrast allergy: No     Physical Exam Current Vitals Temp: 98 F (36.7 C) (Temp Source: Oral)  Pulse Rate: 93  Resp: (!) 21  BP: 119/61  SpO2: 99 %  Height: '5\' 2"'  (157.5 cm)  Weight: 62.6 kg  Body mass index is 25.24 kg/m.  General: Alert and answers questions appropriately. No apparent distress. HEENT: Normocephalic, atraumatic. Conjunctivae  normal without scleral icterus. Mallampati score: II (hard and soft palate, upper portion of tonsils anduvula visible) Cardiac: Regular rate. No dependent edema. Pulmonary: Normal work of breathing. On room air. Abdominal: Soft without distension. Extremities: Normally-formed, well perfused.    Pertinent Lab Results Labs: CBC:    BMP:   Coagulation:    CBC Trends: No results for input(s): WBC, HGB, HCT, PLT in the last 72 hours.   Creatinine Trend: No results for input(s): CREATININE in the last 72 hours.    Assessment & Plan Hannah Mcdonald is a 65 y.o. female with a history of myeloma seen today for CT guided BMBx.  Appropriate candidate for procedure today.   Patient is appropriate candidate for Bone Marrow Biopsy. Risks and benefits discussed, including but not limited to procedure-specific risks of infection or bleeding, and patient is agreeable to proceed.    Procedure Checklist:  Consent obtained: Risks of the procedure as well as the alternatives and risks of each were explained to the patient and/or caregiver.  Consent for the procedure was obtained and is signed in the bedside chart Consent obtained from: The patient Patient is appropriate candidate for sedation Yes ASA Classification: ASA 3 - Patient with moderate systemic disease with functional limitations NPO status: 0000  Code status:   Code Status: Prior Pre-procedural prep necessary: none     I spent a total of 15 Minutes in face-to-face in clinical consultation, greater than 50% of which was spent on medical decision-making and counseling/coordinating care for bone marrow biopsy.     Albin Felling, MD  Vascular and Interventional Radiology 01/24/2021 8:42 AM

## 2021-01-25 ENCOUNTER — Ambulatory Visit: Payer: BC Managed Care – PPO

## 2021-01-25 ENCOUNTER — Other Ambulatory Visit: Payer: BC Managed Care – PPO

## 2021-01-25 ENCOUNTER — Ambulatory Visit: Payer: BC Managed Care – PPO | Admitting: Oncology

## 2021-01-26 DIAGNOSIS — Z01818 Encounter for other preprocedural examination: Secondary | ICD-10-CM | POA: Insufficient documentation

## 2021-01-28 ENCOUNTER — Ambulatory Visit: Payer: BC Managed Care – PPO

## 2021-01-28 DIAGNOSIS — R7689 Other specified abnormal immunological findings in serum: Secondary | ICD-10-CM | POA: Insufficient documentation

## 2021-01-28 DIAGNOSIS — R768 Other specified abnormal immunological findings in serum: Secondary | ICD-10-CM | POA: Insufficient documentation

## 2021-01-28 NOTE — Progress Notes (Signed)
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  INTERVAL HISTORY: Patient returns to clinic today for repeat laboratory work and discussion of her bone marrow biopsy results.  In the past several days she noticed increased "shakiness" and unsteady on her feet similar to when she was found to have significant hypercalcemia.  She has had no syncopal episodes or falls.  She otherwise feels well.  She only has minimal flank/rib pain.  She continues to have chronic weakness and fatigue.  She has no neurologic complaints.  She denies any recent fevers.  She has no chest pain, cough, or hemoptysis.  She denies any vomiting, constipation, or diarrhea.  She has no urinary complaints.  Patient offers no further specific complaints today.  REVIEW OF SYSTEMS:   Review of Systems  Constitutional: Negative.  Negative for fever, malaise/fatigue and weight loss.  Respiratory: Negative.  Negative for cough, hemoptysis and shortness of breath.   Cardiovascular:  Negative for chest pain and leg swelling.  Gastrointestinal: Negative.  Negative for abdominal pain and nausea.  Genitourinary: Negative.  Negative for dysuria.  Musculoskeletal: Negative.  Negative for back pain and joint pain.  Skin: Negative.  Negative for rash.  Neurological: Negative.  Negative for dizziness, tremors, focal weakness, weakness and headaches.  Psychiatric/Behavioral: Negative.  The patient is not nervous/anxious.    As per HPI. Otherwise, a complete review of systems is negative.  PAST MEDICAL HISTORY: Past Medical History:  Diagnosis Date   Anxiety    Diabetes mellitus without complication  ([place]) 3295   GERD (gastroesophageal reflux disease)    Hyperlipidemia    Myeloma ([place])    Personal history of colonic polyps    Squamous cell carcinoma of skin 11/25/2013   Left chest. WD SCC with superficial infiltration.   Tachycardia     PAST SURGICAL HISTORY: Past Surgical History:  Procedure Laterality Date   AUGMENTATION MAMMAPLASTY Bilateral 1884   silicone and replacement in 2001   [place], [place], 1998,2003, 2008, 2013   COLONOSCOPY WITH PROPOFOL N/A 08/16/2016   Procedure: COLONOSCOPY WITH PROPOFOL;  Surgeon: Robert Bellow, MD;  Location: [facility] ENDOSCOPY;  Service: Endoscopy;  Laterality: N/A;   DILATION AND CURETTAGE OF UTERUS     ENDOMETRIAL ABLATION  12/1991   ganglion cyst removal      IR IMAGING GUIDED PORT INSERTION  10/27/2020   KYPHOPLASTY N/A 10/28/2020   Procedure: T12 and L3 KYPHOPLASTY;  Surgeon: Hessie Knows, MD;  Location: [facility] ORS;  Service: Orthopedics;  Laterality: N/A;   TONSILLECTOMY     WRIST SURGERY Right 05/1995    FAMILY HISTORY: Family History  Problem Relation Age of Onset   Colon polyps Sister    Colon cancer Father 57   Diabetes Mother    Breast cancer Neg Hx     ADVANCED DIRECTIVES (Y/N):  N  HEALTH MAINTENANCE: Social History   Tobacco Use   Smoking status: Former    Packs/day: 1.00    Years: 4.00    Pack years: 4.00    Types: Cigarettes    Quit date: 1982    Years since quitting: 30.9  Smokeless tobacco: Never  Vaping Use   Vaping Use: Never used  Substance Use Topics   Alcohol use: Yes    Alcohol/week: 1.0 - 2.0 standard drink    Types: 1 - 2 Glasses of wine per week    Comment: occassionally   Drug use: No     Colonoscopy:  PAP:  Bone density:  Lipid panel:  No Known Allergies  Current Outpatient Medications  Medication Sig Dispense Refill   acyclovir (ZOVIRAX) 400 MG tablet Take 1 tablet (400 mg total) by mouth 2 (two) times daily.  60 tablet 5   ASPIRIN 81 PO Take 81 mg by mouth daily.     atorvastatin (LIPITOR) 20 MG tablet Take 20 mg by mouth daily.     busPIRone (BUSPAR) 30 MG tablet Take 30 mg by mouth 2 (two) times daily.     CALCIUM-VITAMIN D PO Take 1 tablet by mouth daily.     cyclobenzaprine (FLEXERIL) 10 MG tablet Take 1 tablet (10 mg total) by mouth at bedtime. 30 tablet 2   gabapentin (NEURONTIN) 300 MG capsule Take 300 mg by mouth at bedtime.     lidocaine-prilocaine (EMLA) cream Apply 1 application topically as needed. 30 g 2   liraglutide (VICTOZA) 18 MG/3ML SOPN Inject 1.2 mg into the skin daily.     Magnesium 200 MG TABS Take 1 tablet by mouth daily.     meloxicam (MOBIC) 15 MG tablet Take 15 mg by mouth daily.     metFORMIN (GLUCOPHAGE) 500 MG tablet Take 1,000 mg by mouth 2 (two) times daily with a meal.     methocarbamol (ROBAXIN) 500 MG tablet Take 1 tablet (500 mg total) by mouth every 6 (six) hours as needed for muscle spasms. 40 tablet 1   metoprolol succinate (TOPROL-XL) 50 MG 24 hr tablet Take 50 mg by mouth 2 (two) times daily. Take with or immediately following a meal.     Misc Natural Products (GLUCOSAMINE CHOND CMP TRIPLE) TABS Take 1 tablet by mouth daily.     morphine (MS CONTIN) 15 MG 12 hr tablet Take 2 tablets (30 mg total) by mouth every 12 (twelve) hours. 120 tablet 0   Multiple Vitamin (MULTIVITAMIN WITH MINERALS) TABS tablet Take 1 tablet by mouth daily.     omeprazole (PRILOSEC) 40 MG capsule Take 40 mg by mouth daily.     ondansetron (ZOFRAN) 8 MG tablet Take 1 tablet (8 mg total) by mouth 2 (two) times daily as needed (Nausea or vomiting). 60 tablet 2   oxyCODONE-acetaminophen (PERCOCET/ROXICET) 5-325 MG tablet TAKE 1-2 TABLETS BY MOUTH EVERY 4 HOURS AS NEEDED FOR SEVERE PAIN 90 tablet 0   prochlorperazine (COMPAZINE) 10 MG tablet Take 1 tablet (10 mg total) by mouth every 6 (six) hours as needed (Nausea or vomiting). 60 tablet 2   traMADol (ULTRAM) 50 MG tablet TAKE 1 TABLET BY  MOUTH TWICE DAILY AS NEEDED FOR MODERATE PAIN 60 tablet 1   polyethylene glycol (MIRALAX / GLYCOLAX) 17 g packet Take by mouth daily as needed for mild constipation. (Patient not taking: Reported on 02/03/2021)     No current facility-administered medications for this visit.   Facility-Administered Medications Ordered in Other Visits  Medication Dose Route Frequency Provider Last Rate Last Admin   sodium chloride flush (NS) 0.9 % injection 10 mL  10 mL Intravenous PRN Lloyd Huger, MD   10 mL at 11/23/20 0932    OBJECTIVE: Vitals:   02/03/21 1517  BP: 95/66  Pulse: 95  Resp: 16  Temp: 97.9 F (36.6 C)     Body mass index is 24.14 kg/m.    ECOG FS:0 - Asymptomatic  General: Well-developed, well-nourished, no acute distress. Eyes: Pink conjunctiva, anicteric sclera. HEENT: Normocephalic, moist mucous membranes. Lungs: No audible wheezing or coughing. Heart: Regular rate and rhythm. Abdomen: Soft, nontender, no obvious distention. Musculoskeletal: No edema, cyanosis, or clubbing. Neuro: Alert, answering all questions appropriately. Cranial nerves grossly intact. Skin: No rashes or petechiae noted. Psych: Normal affect.   LAB RESULTS:  Lab Results  Component Value Date   NA 139 02/03/2021   K 4.6 02/03/2021   CL 101 02/03/2021   CO2 26 02/03/2021   GLUCOSE 148 (H) 02/03/2021   BUN 18 02/03/2021   CREATININE 1.02 (H) 02/03/2021   CALCIUM 8.9 02/03/2021   PROT 6.3 (L) 02/03/2021   ALBUMIN 3.8 02/03/2021   AST 28 02/03/2021   ALT 13 02/03/2021   ALKPHOS 101 02/03/2021   BILITOT 0.3 02/03/2021   GFRNONAA >60 02/03/2021    Lab Results  Component Value Date   WBC 3.2 (L) 02/03/2021   NEUTROABS 2.1 02/03/2021   HGB 10.3 (L) 02/03/2021   HCT 32.2 (L) 02/03/2021   MCV 100.9 (H) 02/03/2021   PLT 193 02/03/2021     STUDIES: CT BONE MARROW BIOPSY & ASPIRATION  Result Date: 01/24/2021 INDICATION: Myeloma EXAM: CT BONE MARROW BIOPSY AND ASPIRATION MEDICATIONS:  None. ANESTHESIA/SEDATION: Moderate (conscious) sedation was employed during this procedure. A total of Versed 2 mg and Fentanyl 100 mcg was administered intravenously. Moderate Sedation Time: 10 minutes. The patient's level of consciousness and vital signs were monitored continuously by radiology nursing throughout the procedure under my direct supervision. FLUOROSCOPY TIME:  N/a COMPLICATIONS: None immediate. PROCEDURE: Informed written consent was obtained from the patient after a thorough discussion of the procedural risks, benefits and alternatives. All questions were addressed. Maximal Sterile Barrier Technique was utilized including caps, mask, sterile gowns, sterile gloves, sterile drape, hand hygiene and skin antiseptic. A timeout was performed prior to the initiation of the procedure. The patient was placed prone on the CT exam table. Limited CT of the pelvis was performed for planning purposes. Skin entry site was marked, and the overlying skin was prepped and draped in the standard sterile fashion. Local analgesia was obtained with 1% lidocaine. Using CT guidance, an 11 gauge needle was advanced just deep to the cortex of the right posterior ilium. Subsequently, bone marrow aspiration and core biopsy were performed. Specimens were submitted to lab/pathology for handling. Hemostasis was achieved with manual pressure, and a clean dressing was placed. The patient tolerated the procedure well without immediate complication. IMPRESSION: Successful CT-guided bone marrow aspiration and core biopsy of the right posterior ilium. Electronically Signed   By: Albin Felling M.D.   On: 01/24/2021 12:45     ASSESSMENT:  Lambda light chain myeloma with 1p del, in complete remission.  PLAN:      1.  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  Initially her lambda free light chains were elevated at 432.1, but now are within normal limits at 12.1.  Immunoglobulins  and SPEP are normal.  Bone marrow biopsy completed on August 26, 2020 revealed 25% plasma cells along with the deletion of 1p which is considered poor prognosis.  PET scan results from September 06, 2020 reviewed independently with 3 hypermetabolic lesions in right C5-6 facets, right iliac crest lesion, and L3  vertebral lesion.  After lengthy discussion with the patient, she agreed to pursue chemotherapy with daratumumab, Velcade, Revlimid, and dexamethasone followed by autologous bone marrow transplant.  Patient received weekly daratumumab for 4 cycles along with Velcade on days 1, 4, 8, and 11.  She also received Revlimid on days 1 through 14 of a 21-day cycle.  She completed cycle 4 of treatment on January 14, 2021.  Subsequent bone marrow biopsy on January 24, 2021 revealed complete remission with no evidence of myeloma.  Patient is undergoing autologous stem cell transplant at [facility] in the next 1 to 2 weeks.  Return to clinic at the end of January approximately 1 month after completion of her stem cell transplant for further evaluation and discussion of maintenance treatment.   2.  Elevated liver enzymes: Resolved.   3.  Pain: Improved.  Patient has now completed XRT.  She had kyphoplasty x2.  Better controlled on 30 mg MS Contin every 12 hours.  Patient continues to take Percocet as needed sparingly.  4.  Hypercalcemia: Resolved.  She last received Zometa on January 04, 2021. 5.  Renal insufficiency: Resolved.   6.  Leukopenia: Chronic and unchanged.  Patient's total white blood cell count is 3.2 today. 7.  Anemia: Improving.  Patient's hemoglobin is 10.3 today. 8.  Thrombocytopenia: Resolved.  Patient did not receive  day 15 daratumumab with cycles 3 and 4 because of persistent cytopenias. 9.  Syncopal episode/shortness of breath/peripheral edema: Resolved.  Patient's most recent echo on January 01, 2021 reported an EF of 55 to 60%.  Appreciate cardiology input.    Patient expressed  understanding and was in agreement with this plan. She also understands that She can call clinic at any time with any questions, concerns, or complaints.      Cancer Staging  Lambda light chain myeloma ([place]) Staging form: Plasma Cell Myeloma and Plasma Cell Disorders, AJCC 8th Edition - Clinical stage from 09/16/2020: RISS Stage I (Beta-2-microglobulin (mg/L): 2.5, Albumin (g/dL): 4.9, ISS: Stage I, High-risk cytogenetics: Absent, LDH: Normal) - Signed by Lloyd Huger, MD on 09/16/2020 Beta 2 microglobulin range (mg/L): Less than 3.5 Albumin range (g/dL): Greater than or equal to 3.5 Cytogenetics: 1p deletion  Lloyd Huger, MD   02/06/2021 6:45 AM

## 2021-02-01 ENCOUNTER — Other Ambulatory Visit: Payer: Self-pay | Admitting: Oncology

## 2021-02-02 ENCOUNTER — Encounter (HOSPITAL_COMMUNITY): Payer: Self-pay | Admitting: Oncology

## 2021-02-02 ENCOUNTER — Other Ambulatory Visit: Payer: Self-pay | Admitting: Oncology

## 2021-02-02 MED ORDER — MORPHINE SULFATE ER 15 MG PO TBCR
30.0000 mg | EXTENDED_RELEASE_TABLET | Freq: Two times a day (BID) | ORAL | 0 refills | Status: DC
Start: 2021-02-02 — End: 2021-02-11

## 2021-02-03 ENCOUNTER — Inpatient Hospital Stay: Payer: BC Managed Care – PPO

## 2021-02-03 ENCOUNTER — Encounter: Payer: Self-pay | Admitting: Oncology

## 2021-02-03 ENCOUNTER — Inpatient Hospital Stay: Payer: BC Managed Care – PPO | Attending: Oncology | Admitting: Oncology

## 2021-02-03 ENCOUNTER — Other Ambulatory Visit: Payer: Self-pay

## 2021-02-03 ENCOUNTER — Telehealth: Payer: Self-pay | Admitting: Oncology

## 2021-02-03 VITALS — BP 95/66 | HR 95 | Temp 97.9°F | Resp 16 | Ht 62.0 in | Wt 132.0 lb

## 2021-02-03 DIAGNOSIS — Z79899 Other long term (current) drug therapy: Secondary | ICD-10-CM | POA: Diagnosis not present

## 2021-02-03 DIAGNOSIS — D72819 Decreased white blood cell count, unspecified: Secondary | ICD-10-CM | POA: Diagnosis not present

## 2021-02-03 DIAGNOSIS — Z923 Personal history of irradiation: Secondary | ICD-10-CM | POA: Insufficient documentation

## 2021-02-03 DIAGNOSIS — D649 Anemia, unspecified: Secondary | ICD-10-CM | POA: Insufficient documentation

## 2021-02-03 DIAGNOSIS — C9000 Multiple myeloma not having achieved remission: Secondary | ICD-10-CM | POA: Diagnosis present

## 2021-02-03 LAB — CBC WITH DIFFERENTIAL/PLATELET
Abs Immature Granulocytes: 0 10*3/uL (ref 0.00–0.07)
Basophils Absolute: 0 10*3/uL (ref 0.0–0.1)
Basophils Relative: 1 %
Eosinophils Absolute: 0.1 10*3/uL (ref 0.0–0.5)
Eosinophils Relative: 4 %
HCT: 32.2 % — ABNORMAL LOW (ref 36.0–46.0)
Hemoglobin: 10.3 g/dL — ABNORMAL LOW (ref 12.0–15.0)
Immature Granulocytes: 0 %
Lymphocytes Relative: 17 %
Lymphs Abs: 0.6 10*3/uL — ABNORMAL LOW (ref 0.7–4.0)
MCH: 32.3 pg (ref 26.0–34.0)
MCHC: 32 g/dL (ref 30.0–36.0)
MCV: 100.9 fL — ABNORMAL HIGH (ref 80.0–100.0)
Monocytes Absolute: 0.4 10*3/uL (ref 0.1–1.0)
Monocytes Relative: 12 %
Neutro Abs: 2.1 10*3/uL (ref 1.7–7.7)
Neutrophils Relative %: 66 %
Platelets: 193 10*3/uL (ref 150–400)
RBC: 3.19 MIL/uL — ABNORMAL LOW (ref 3.87–5.11)
RDW: 16.1 % — ABNORMAL HIGH (ref 11.5–15.5)
WBC: 3.2 10*3/uL — ABNORMAL LOW (ref 4.0–10.5)
nRBC: 0 % (ref 0.0–0.2)

## 2021-02-03 LAB — COMPREHENSIVE METABOLIC PANEL
ALT: 13 U/L (ref 0–44)
AST: 28 U/L (ref 15–41)
Albumin: 3.8 g/dL (ref 3.5–5.0)
Alkaline Phosphatase: 101 U/L (ref 38–126)
Anion gap: 12 (ref 5–15)
BUN: 18 mg/dL (ref 8–23)
CO2: 26 mmol/L (ref 22–32)
Calcium: 8.9 mg/dL (ref 8.9–10.3)
Chloride: 101 mmol/L (ref 98–111)
Creatinine, Ser: 1.02 mg/dL — ABNORMAL HIGH (ref 0.44–1.00)
GFR, Estimated: >60 mL/min (ref >60)
Glucose, Bld: 148 mg/dL — ABNORMAL HIGH (ref 70–99)
Potassium: 4.6 mmol/L (ref 3.5–5.1)
Sodium: 139 mmol/L (ref 135–145)
Total Bilirubin: 0.3 mg/dL (ref 0.3–1.2)
Total Protein: 6.3 g/dL — ABNORMAL LOW (ref 6.5–8.1)

## 2021-02-03 MED ORDER — HEPARIN SOD (PORK) LOCK FLUSH 100 UNIT/ML IV SOLN
500.0000 [IU] | Freq: Once | INTRAVENOUS | Status: AC
  Administered 2021-02-03: 500 [IU] via INTRAVENOUS
  Filled 2021-02-03: qty 5

## 2021-02-03 MED ORDER — SODIUM CHLORIDE 0.9% FLUSH
10.0000 mL | Freq: Once | INTRAVENOUS | Status: AC
  Administered 2021-02-03: 10 mL via INTRAVENOUS
  Filled 2021-02-03: qty 10

## 2021-02-03 NOTE — Telephone Encounter (Signed)
Pt called in and wants to come in for appt today. Please give her a call back at [phone]

## 2021-02-03 NOTE — Progress Notes (Signed)
Pt has had a few times that she feels like she is weak and [place]. She has had several falls and does not have any canes or walker. She also has pains like nerves stinging and that usually is indicator that she might be low on electrolytes. She has low back pain today and later she said or legs are sore

## 2021-02-03 NOTE — Telephone Encounter (Signed)
Per MD ok to come in, patient called by Triage nurse

## 2021-02-03 NOTE — Telephone Encounter (Signed)
Patient requesting to change her virtual to an in person appointment today and possibly get labs checked as she is having that jerking motion in her hands again. Please advise

## 2021-02-04 LAB — KAPPA/LAMBDA LIGHT CHAINS
Kappa free light chain: 8.1 mg/L (ref 3.3–19.4)
Kappa, lambda light chain ratio: 1.08 (ref 0.26–1.65)
Lambda free light chains: 7.5 mg/L (ref 5.7–26.3)

## 2021-02-06 ENCOUNTER — Encounter: Payer: Self-pay | Admitting: Oncology

## 2021-02-08 LAB — SURGICAL PATHOLOGY

## 2021-02-11 ENCOUNTER — Other Ambulatory Visit: Payer: Self-pay | Admitting: Oncology

## 2021-02-15 DIAGNOSIS — Z5111 Encounter for antineoplastic chemotherapy: Secondary | ICD-10-CM | POA: Insufficient documentation

## 2021-02-17 ENCOUNTER — Other Ambulatory Visit: Payer: Self-pay | Admitting: Oncology

## 2021-02-18 ENCOUNTER — Encounter: Payer: Self-pay | Admitting: Oncology

## 2021-02-22 ENCOUNTER — Other Ambulatory Visit: Payer: Self-pay | Admitting: Oncology

## 2021-03-16 ENCOUNTER — Other Ambulatory Visit: Payer: Self-pay | Admitting: *Deleted

## 2021-03-16 DIAGNOSIS — C9000 Multiple myeloma not having achieved remission: Secondary | ICD-10-CM

## 2021-03-17 ENCOUNTER — Other Ambulatory Visit: Payer: Self-pay | Admitting: Emergency Medicine

## 2021-03-17 ENCOUNTER — Telehealth: Payer: Self-pay | Admitting: Oncology

## 2021-03-17 DIAGNOSIS — C9000 Multiple myeloma not having achieved remission: Secondary | ICD-10-CM

## 2021-03-17 NOTE — Telephone Encounter (Signed)
Returned call to [employer], Therapist, sports. States patient is doing well and that they kept her an extra week d/t GI symptoms. Lisse stated that if we could get labs the week after discharge that would be great. Fax number provided per request so that orders, notes etc can be faxed to [place]. Dr. Grayland Ormond made aware.

## 2021-03-17 NOTE — Telephone Encounter (Signed)
Rn from [facility] called to let us know that pt will be discharged on 1-23 and needs to see Dr. Grayland Ormond the 1st week of Feb. Rn, Lisse, call back number is [phone].

## 2021-03-17 NOTE — Progress Notes (Signed)
Request received from [facility] to draw CBC/CMP/Mag weekly x6. First lab appt to be drawn on 03/28/2021, per MD

## 2021-03-22 ENCOUNTER — Encounter: Payer: Self-pay | Admitting: Oncology

## 2021-03-24 ENCOUNTER — Telehealth: Payer: Self-pay | Admitting: *Deleted

## 2021-03-24 ENCOUNTER — Other Ambulatory Visit: Payer: Self-pay | Admitting: Emergency Medicine

## 2021-03-24 ENCOUNTER — Other Ambulatory Visit: Payer: BC Managed Care – PPO

## 2021-03-24 ENCOUNTER — Ambulatory Visit: Payer: BC Managed Care – PPO | Admitting: Oncology

## 2021-03-24 DIAGNOSIS — C9000 Multiple myeloma not having achieved remission: Secondary | ICD-10-CM

## 2021-03-24 NOTE — Telephone Encounter (Signed)
Per husband, she is a difficult stick and she had a hickman with transplant because [facility] does not use ports, they removed her port pre transplant and removed her hickman yesterday. He states a port is more convenient and less painful and that she usually has to get stuck 3 times with peripheral blood draws.

## 2021-03-24 NOTE — Telephone Encounter (Addendum)
Port placement order placed and faxed to IR. Spoke with pt husband about the referral. Verbalized understanding that ports may eliminate most, but not all peripheral sticks in the case of no blood return.

## 2021-03-24 NOTE — Telephone Encounter (Signed)
Hannah Mcdonald called asking for return call to set up for patient to have a port inserted.

## 2021-03-25 ENCOUNTER — Encounter: Payer: Self-pay | Admitting: Emergency Medicine

## 2021-03-25 NOTE — Telephone Encounter (Signed)
Spoke with patient and understood.

## 2021-03-28 ENCOUNTER — Other Ambulatory Visit: Payer: Self-pay

## 2021-03-28 ENCOUNTER — Inpatient Hospital Stay: Payer: BC Managed Care – PPO | Attending: Oncology

## 2021-03-28 DIAGNOSIS — D72819 Decreased white blood cell count, unspecified: Secondary | ICD-10-CM | POA: Diagnosis not present

## 2021-03-28 DIAGNOSIS — C9000 Multiple myeloma not having achieved remission: Secondary | ICD-10-CM | POA: Insufficient documentation

## 2021-03-28 DIAGNOSIS — Z923 Personal history of irradiation: Secondary | ICD-10-CM | POA: Diagnosis not present

## 2021-03-28 DIAGNOSIS — D649 Anemia, unspecified: Secondary | ICD-10-CM | POA: Diagnosis not present

## 2021-03-28 DIAGNOSIS — Z79899 Other long term (current) drug therapy: Secondary | ICD-10-CM | POA: Insufficient documentation

## 2021-03-28 LAB — CBC WITH DIFFERENTIAL/PLATELET
Abs Immature Granulocytes: 0.02 10*3/uL (ref 0.00–0.07)
Basophils Absolute: 0 10*3/uL (ref 0.0–0.1)
Basophils Relative: 0 %
Eosinophils Absolute: 0 10*3/uL (ref 0.0–0.5)
Eosinophils Relative: 1 %
HCT: 32.4 % — ABNORMAL LOW (ref 36.0–46.0)
Hemoglobin: 10.3 g/dL — ABNORMAL LOW (ref 12.0–15.0)
Immature Granulocytes: 0 %
Lymphocytes Relative: 7 %
Lymphs Abs: 0.4 10*3/uL — ABNORMAL LOW (ref 0.7–4.0)
MCH: 31.2 pg (ref 26.0–34.0)
MCHC: 31.8 g/dL (ref 30.0–36.0)
MCV: 98.2 fL (ref 80.0–100.0)
Monocytes Absolute: 0.6 10*3/uL (ref 0.1–1.0)
Monocytes Relative: 11 %
Neutro Abs: 4.4 10*3/uL (ref 1.7–7.7)
Neutrophils Relative %: 81 %
Platelets: 114 10*3/uL — ABNORMAL LOW (ref 150–400)
RBC: 3.3 MIL/uL — ABNORMAL LOW (ref 3.87–5.11)
RDW: 17.4 % — ABNORMAL HIGH (ref 11.5–15.5)
WBC: 5.5 10*3/uL (ref 4.0–10.5)
nRBC: 0 % (ref 0.0–0.2)

## 2021-03-28 LAB — COMPREHENSIVE METABOLIC PANEL
ALT: 11 U/L (ref 0–44)
AST: 19 U/L (ref 15–41)
Albumin: 3.2 g/dL — ABNORMAL LOW (ref 3.5–5.0)
Alkaline Phosphatase: 90 U/L (ref 38–126)
Anion gap: 9 (ref 5–15)
BUN: 7 mg/dL — ABNORMAL LOW (ref 8–23)
CO2: 26 mmol/L (ref 22–32)
Calcium: 8.5 mg/dL — ABNORMAL LOW (ref 8.9–10.3)
Chloride: 99 mmol/L (ref 98–111)
Creatinine, Ser: 0.88 mg/dL (ref 0.44–1.00)
GFR, Estimated: >60 mL/min (ref >60)
Glucose, Bld: 206 mg/dL — ABNORMAL HIGH (ref 70–99)
Potassium: 3.5 mmol/L (ref 3.5–5.1)
Sodium: 134 mmol/L — ABNORMAL LOW (ref 135–145)
Total Bilirubin: 0.6 mg/dL (ref 0.3–1.2)
Total Protein: 6.2 g/dL — ABNORMAL LOW (ref 6.5–8.1)

## 2021-03-28 LAB — SAMPLE TO BLOOD BANK

## 2021-03-28 LAB — MAGNESIUM: Magnesium: 1.4 mg/dL — ABNORMAL LOW (ref 1.7–2.4)

## 2021-03-29 LAB — KAPPA/LAMBDA LIGHT CHAINS
Kappa free light chain: 3.4 mg/L (ref 3.3–19.4)
Kappa, lambda light chain ratio: 1.17 (ref 0.26–1.65)
Lambda free light chains: 2.9 mg/L — ABNORMAL LOW (ref 5.7–26.3)

## 2021-03-31 NOTE — Progress Notes (Signed)
Patient on schedule for Port placement 04/04/2021, spoke with patient on phone with pre procedure instructions given. Made aware to be here @ 0800, NPO after MN prior to procedure, and driver post procedure/recovery/discharge. Stated understanding.

## 2021-04-01 ENCOUNTER — Other Ambulatory Visit: Payer: Self-pay | Admitting: Student

## 2021-04-04 ENCOUNTER — Ambulatory Visit
Admission: RE | Admit: 2021-04-04 | Discharge: 2021-04-04 | Disposition: A | Discharge status: Home / Self Care | Payer: BC Managed Care – PPO | Source: Ambulatory Visit | Attending: Oncology | Admitting: Oncology

## 2021-04-04 ENCOUNTER — Other Ambulatory Visit: Payer: Self-pay

## 2021-04-04 DIAGNOSIS — Z7985 Long-term (current) use of injectable non-insulin antidiabetic drugs: Secondary | ICD-10-CM | POA: Diagnosis not present

## 2021-04-04 DIAGNOSIS — F419 Anxiety disorder, unspecified: Secondary | ICD-10-CM | POA: Insufficient documentation

## 2021-04-04 DIAGNOSIS — C9000 Multiple myeloma not having achieved remission: Secondary | ICD-10-CM | POA: Diagnosis not present

## 2021-04-04 DIAGNOSIS — E119 Type 2 diabetes mellitus without complications: Secondary | ICD-10-CM | POA: Insufficient documentation

## 2021-04-04 DIAGNOSIS — Z7984 Long term (current) use of oral hypoglycemic drugs: Secondary | ICD-10-CM | POA: Diagnosis not present

## 2021-04-04 HISTORY — PX: IR IMAGING GUIDED PORT INSERTION: IMG5740

## 2021-04-04 LAB — GLUCOSE, CAPILLARY: Glucose-Capillary: 141 mg/dL — ABNORMAL HIGH (ref 70–99)

## 2021-04-04 MED ORDER — FENTANYL CITRATE (PF) 100 MCG/2ML IJ SOLN
INTRAMUSCULAR | Status: AC
  Filled 2021-04-04: qty 2

## 2021-04-04 MED ORDER — MIDAZOLAM HCL 5 MG/5ML IJ SOLN
INTRAMUSCULAR | Status: AC | PRN
  Administered 2021-04-04: 1 mg via INTRAVENOUS

## 2021-04-04 MED ORDER — MIDAZOLAM HCL 2 MG/2ML IJ SOLN
INTRAMUSCULAR | Status: AC | PRN
Start: 2021-04-04 — End: 2021-04-04
  Administered 2021-04-04: 1 mg via INTRAVENOUS

## 2021-04-04 MED ORDER — MIDAZOLAM HCL 2 MG/2ML IJ SOLN
INTRAMUSCULAR | Status: AC
  Filled 2021-04-04: qty 2

## 2021-04-04 MED ORDER — LIDOCAINE-EPINEPHRINE 1 %-1:100000 IJ SOLN
INTRAMUSCULAR | Status: AC
  Administered 2021-04-04: 10 mL via INTRADERMAL
  Filled 2021-04-04: qty 1

## 2021-04-04 MED ORDER — FENTANYL CITRATE (PF) 100 MCG/2ML IJ SOLN
INTRAMUSCULAR | Status: AC | PRN
  Administered 2021-04-04 (×2): 50 ug via INTRAVENOUS

## 2021-04-04 MED ORDER — HEPARIN SOD (PORK) LOCK FLUSH 100 UNIT/ML IV SOLN
INTRAVENOUS | Status: AC
  Administered 2021-04-04: 500 [IU] via INTRAVENOUS
  Filled 2021-04-04: qty 5

## 2021-04-04 MED ORDER — SODIUM CHLORIDE 0.9 % IV SOLN
INTRAVENOUS | Status: DC
  Filled 2021-04-04: qty 1000

## 2021-04-04 NOTE — Progress Notes (Signed)
Patient clinically stable post Port placement per DR El-Abd, tolerated well. Denies complaints presently. Received Versed 2 mg along with Fentanyl 100 mcg IV for procedure. Vitals stable pre and post procedure. Update given to husband post procedure,specials. Report given to Fransico Michael RN post procedure/specials.

## 2021-04-04 NOTE — Procedures (Signed)
Interventional Radiology Procedure Note  Date of Procedure: 04/04/2021  Procedure: Chest port placement   Findings:  1. Successful placement of right chest port    Complications: No immediate complications noted.   Estimated Blood Loss: minimal  Follow-up and Recommendations: 1. Ready for use    Albin Felling, MD  Vascular & Interventional Radiology  04/04/2021 10:08 AM

## 2021-04-04 NOTE — H&P (Signed)
Chief Complaint: Patient was seen in consultation today for port placement.  Referring Physician(s): Finnegan,Timothy J  Supervising Physician: Juliet Rude  Patient Status: [facility] - Out-pt  History of Present Illness: Hannah Mcdonald is a 66 y.o. female with a past medical history significant for anxiety, DM, HTL and lambda light chain myeloma who presents today for port placement. Hannah Mcdonald previously had a right IJ port placed 10/27/20 in IR (Dr. Dwaine Gale) for systemic therapy, per [facility] Everywhere this port was removed and an apheresis catheter was placed on 02/16/21. She had several rounds of systemic therapy via this right Hickman catheter and then the catheter was subsequently removed on 03/23/21. She presents today to have a new port placed for ongoing systemic therapy  Past Medical History:  Diagnosis Date   Anxiety    Diabetes mellitus without complication ([place]) 1324   GERD (gastroesophageal reflux disease)    Hyperlipidemia    Myeloma ([place])    Personal history of colonic polyps    Squamous cell carcinoma of skin 11/25/2013   Left chest. WD SCC with superficial infiltration.   Tachycardia     Past Surgical History:  Procedure Laterality Date   AUGMENTATION MAMMAPLASTY Bilateral 4010   silicone and replacement in 2001   [place], [place], 1998,2003, 2008, 2013   COLONOSCOPY WITH PROPOFOL N/A 08/16/2016   Procedure: COLONOSCOPY WITH PROPOFOL;  Surgeon: Robert Bellow, MD;  Location: [facility] ENDOSCOPY;  Service: Endoscopy;  Laterality: N/A;   DILATION AND CURETTAGE OF UTERUS     ENDOMETRIAL ABLATION  12/1991   ganglion cyst removal      IR IMAGING GUIDED PORT INSERTION  10/27/2020   KYPHOPLASTY N/A 10/28/2020   Procedure: T12 and L3 KYPHOPLASTY;  Surgeon: Hessie Knows, MD;  Location: [facility] ORS;  Service: Orthopedics;  Laterality: N/A;   TONSILLECTOMY     WRIST SURGERY Right 05/1995    Allergies: Patient  has no known allergies.  Medications: Prior to Admission medications   Medication Sig Start Date End Date Taking? Authorizing Provider  acyclovir (ZOVIRAX) 400 MG tablet Take 1 tablet (400 mg total) by mouth 2 (two) times daily. 10/19/20  Yes Lloyd Huger, MD  ASPIRIN 81 PO Take 81 mg by mouth daily. 11/02/20  Yes [provider]  atorvastatin (LIPITOR) 20 MG tablet Take 20 mg by mouth daily.   Yes [provider]  busPIRone (BUSPAR) 30 MG tablet Take 30 mg by mouth 2 (two) times daily.   Yes [provider]  CALCIUM-VITAMIN D PO Take 1 tablet by mouth daily.   Yes [provider]  cyclobenzaprine (FLEXERIL) 10 MG tablet TAKE 1 TABLET BY MOUTH AT BEDTIME 02/18/21  Yes Burns, Wandra Feinstein, NP  gabapentin (NEURONTIN) 300 MG capsule Take 300 mg by mouth at bedtime.   Yes [provider]  meloxicam (MOBIC) 15 MG tablet Take 15 mg by mouth daily.   Yes [provider]  metFORMIN (GLUCOPHAGE) 500 MG tablet Take 1,000 mg by mouth 2 (two) times daily with a meal.   Yes [provider]  methocarbamol (ROBAXIN) 500 MG tablet Take 1 tablet (500 mg total) by mouth every 6 (six) hours as needed for muscle spasms. 10/28/20  Yes Hessie Knows, MD  metoprolol succinate (TOPROL-XL) 50 MG 24 hr tablet Take 50 mg by mouth 2 (two) times daily. Take with or immediately following a meal.   Yes [provider]  Misc Natural Products (GLUCOSAMINE CHOND CMP TRIPLE) TABS Take 1 tablet by mouth daily.   Yes [provider]  morphine (MS CONTIN) 15 MG 12 hr tablet TAKE 2 TABLETS BY MOUTH EVERY 12 HOURS 02/11/21  Yes Lloyd Huger, MD  Multiple Vitamin (MULTIVITAMIN WITH MINERALS) TABS tablet Take 1 tablet by mouth daily.   Yes [provider]  omeprazole (PRILOSEC) 40 MG capsule Take 40 mg by mouth daily.   Yes [provider]  ondansetron (ZOFRAN) 8 MG tablet Take 1 tablet (8 mg total) by mouth 2 (two) times daily as  needed (Nausea or vomiting). 10/19/20  Yes Lloyd Huger, MD  prochlorperazine (COMPAZINE) 10 MG tablet Take 1 tablet (10 mg total) by mouth every 6 (six) hours as needed (Nausea or vomiting). 10/19/20  Yes Lloyd Huger, MD  traMADol (ULTRAM) 50 MG tablet TAKE 1 TABLET BY MOUTH TWICE DAILY AS NEEDED FOR MODERATE PAIN 12/27/20  Yes Lloyd Huger, MD  lidocaine-prilocaine (EMLA) cream APPLY 1 APPLICATION TOPICALLY AS NEEDED 02/17/21   Lloyd Huger, MD  liraglutide (VICTOZA) 18 MG/3ML SOPN Inject 1.2 mg into the skin daily.    [provider]  Magnesium 200 MG TABS Take 1 tablet by mouth daily.    [provider]  oxyCODONE-acetaminophen (PERCOCET/ROXICET) 5-325 MG tablet TAKE 1-2 TABLETS BY MOUTH EVERY 4 HOURS AS NEEDED FOR SEVERE PAIN 02/11/21   Lloyd Huger, MD  polyethylene glycol (MIRALAX / GLYCOLAX) 17 g packet Take by mouth daily as needed for mild constipation. Patient not taking: Reported on 02/03/2021    [provider]     Family History  Problem Relation Age of Onset   Colon polyps Sister    Colon cancer Father 55   Diabetes Mother    Breast cancer Neg Hx     Social History   Socioeconomic History   Marital status: Married    Spouse name: Not on file   Number of children: Not on file   Years of education: Not on file   Highest education level: Not on file  Occupational History   Not on file  Tobacco Use   Smoking status: Former    Packs/day: 1.00    Years: 4.00    Pack years: 4.00    Types: Cigarettes    Quit date: 35    Years since quitting: 41.1   Smokeless tobacco: Never  Vaping Use   Vaping Use: Never used  Substance and Sexual Activity   Alcohol use: Yes    Alcohol/week: 1.0 - 2.0 standard drink    Types: 1 - 2 Glasses of wine per week    Comment: occassionally   Drug use: No   Sexual activity: Not Currently  Other Topics Concern   Not on file  Social History Narrative   Not on file   Social  Determinants of Health   Financial Resource Strain: Not on file  Food Insecurity: Not on file  Transportation Needs: Not on file  Physical Activity: Not on file  Stress: Not on file  Social Connections: Not on file     Review of Systems: A 12 point ROS discussed and pertinent positives are indicated in the HPI above.  All other systems are negative.  Review of Systems  Constitutional:  Positive for appetite change. Negative for chills and fever.  Respiratory:  Negative for cough and shortness of breath.   Cardiovascular:  Negative for chest pain.  Gastrointestinal:  Positive for nausea. Negative  for abdominal pain and vomiting.  Musculoskeletal:  Negative for back pain.  Neurological:  Negative for dizziness and headaches.   Vital Signs: BP 121/64    Pulse 95    Temp (!) 94.8 F (34.9 C) (Oral)    Resp 20    Ht 5\' 2"  (1.575 m)    Wt 138 lb (62.6 kg)    SpO2 96%    BMI 25.24 kg/m   Physical Exam Vitals reviewed.  Constitutional:      General: She is not in acute distress.    Appearance: She is ill-appearing.  HENT:     Head: Normocephalic.     Mouth/Throat:     Mouth: Mucous membranes are dry.     Pharynx: Oropharynx is clear. No oropharyngeal exudate or posterior oropharyngeal erythema.  Cardiovascular:     Rate and Rhythm: Normal rate and regular rhythm.  Pulmonary:     Effort: Pulmonary effort is normal.     Breath sounds: Normal breath sounds.  Abdominal:     General: There is no distension.     Palpations: Abdomen is soft.     Tenderness: There is no abdominal tenderness.  Skin:    General: Skin is warm and dry.  Neurological:     Mental Status: She is alert and oriented to person, place, and time.  Psychiatric:        Mood and Affect: Mood normal.        Behavior: Behavior normal.        Thought Content: Thought content normal.        Judgment: Judgment normal.     MD Evaluation Airway: WNL Heart: WNL Abdomen: WNL Chest/ Lungs: WNL ASA   Classification: 3 Mallampati/Airway Score: Two   Imaging: No results found.  Labs:  CBC: Recent Labs    01/14/21 1211 01/24/21 0849 02/03/21 1551 03/28/21 1119  WBC 4.4 3.7* 3.2* 5.5  HGB 9.8* 9.2* 10.3* 10.3*  HCT 29.9* 28.2* 32.2* 32.4*  PLT 58* 199 193 114*    COAGS: No results for input(s): INR, APTT in the last 8760 hours.  BMP: Recent Labs    01/04/21 0852 01/11/21 0955 02/03/21 1551 03/28/21 1119  NA 139 139 139 134*  K 4.2 3.7 4.6 3.5  CL 105 105 101 99  CO2 25 25 26 26   GLUCOSE 145* 116* 148* 206*  BUN 12 12 18  7*  CALCIUM 8.3* 8.0* 8.9 8.5*  CREATININE 0.92 0.87 1.02* 0.88  GFRNONAA >60 >60 >60 >60    LIVER FUNCTION TESTS: Recent Labs    01/04/21 0852 01/11/21 0955 02/03/21 1551 03/28/21 1119  BILITOT 0.6 0.3 0.3 0.6  AST 22 21 28 19   ALT 18 16 13 11   ALKPHOS 133* 114 101 90  PROT 5.9* 6.1* 6.3* 6.2*  ALBUMIN 3.5 3.5 3.8 3.2*    TUMOR MARKERS: No results for input(s): AFPTM, CEA, CA199, CHROMGRNA in the last 8760 hours.  Assessment and Plan:  66 y/o F with history of lambda light chain myeloma who presents today for port placement for ongoing systemic therapy. Patient previously with right IJ port placement 10/27/20 in IR which was removed 02/16/21 and a Hickman catheter was placed at [facility]. The Hickman catheter was removed 03/23/21.   Risks and benefits of image-guided Port-a-catheter placement were discussed with the patient including, but not limited to bleeding, infection, pneumothorax, or fibrin sheath development and need for additional procedures.  All of the patient's questions were answered, patient is agreeable to proceed.  Consent signed and in chart.  Thank you for this interesting consult.  I greatly enjoyed meeting Hannah Mcdonald and look forward to participating in their care.  A copy of this report was sent to the requesting provider on this date.  Electronically Signed: Joaquim Nam, PA-C 04/04/2021, 8:32 AM   I  spent a total of  15 Minutes in face to face in clinical consultation, greater than 50% of which was counseling/coordinating care for port placement.

## 2021-04-05 ENCOUNTER — Telehealth: Payer: Self-pay | Admitting: *Deleted

## 2021-04-05 NOTE — Telephone Encounter (Signed)
Call returned to patient and advised of doctor recommendation to go to Urgent care, PCP, or ER for evaluate and EKG. Patient in agreement with this and will update [place] on what happens at her appointment Thursday

## 2021-04-05 NOTE — Telephone Encounter (Addendum)
Patient called stating that she was feeling "nervous" last night and checked her heart rate and it was 120, She went to to bed and when she got up this morning, it is stil in the 120's. She is asking what to do. Please advise

## 2021-04-07 ENCOUNTER — Inpatient Hospital Stay: Payer: BC Managed Care – PPO | Attending: Oncology | Admitting: Oncology

## 2021-04-07 ENCOUNTER — Inpatient Hospital Stay: Payer: BC Managed Care – PPO

## 2021-04-07 ENCOUNTER — Encounter: Payer: Self-pay | Admitting: Oncology

## 2021-04-07 ENCOUNTER — Other Ambulatory Visit: Payer: Self-pay

## 2021-04-07 VITALS — BP 119/61 | HR 89 | Temp 96.3°F | Resp 16 | Wt 114.8 lb

## 2021-04-07 DIAGNOSIS — D649 Anemia, unspecified: Secondary | ICD-10-CM | POA: Insufficient documentation

## 2021-04-07 DIAGNOSIS — Z79899 Other long term (current) drug therapy: Secondary | ICD-10-CM | POA: Diagnosis not present

## 2021-04-07 DIAGNOSIS — C9000 Multiple myeloma not having achieved remission: Secondary | ICD-10-CM | POA: Insufficient documentation

## 2021-04-07 DIAGNOSIS — E876 Hypokalemia: Secondary | ICD-10-CM | POA: Diagnosis not present

## 2021-04-07 DIAGNOSIS — Z95828 Presence of other vascular implants and grafts: Secondary | ICD-10-CM

## 2021-04-07 LAB — CBC WITH DIFFERENTIAL/PLATELET
Abs Immature Granulocytes: 0.02 10*3/uL (ref 0.00–0.07)
Basophils Absolute: 0 10*3/uL (ref 0.0–0.1)
Basophils Relative: 1 %
Eosinophils Absolute: 0.2 10*3/uL (ref 0.0–0.5)
Eosinophils Relative: 3 %
HCT: 30.8 % — ABNORMAL LOW (ref 36.0–46.0)
Hemoglobin: 9.9 g/dL — ABNORMAL LOW (ref 12.0–15.0)
Immature Granulocytes: 0 %
Lymphocytes Relative: 8 %
Lymphs Abs: 0.6 10*3/uL — ABNORMAL LOW (ref 0.7–4.0)
MCH: 31.4 pg (ref 26.0–34.0)
MCHC: 32.1 g/dL (ref 30.0–36.0)
MCV: 97.8 fL (ref 80.0–100.0)
Monocytes Absolute: 0.6 10*3/uL (ref 0.1–1.0)
Monocytes Relative: 8 %
Neutro Abs: 5.5 10*3/uL (ref 1.7–7.7)
Neutrophils Relative %: 80 %
Platelets: 173 10*3/uL (ref 150–400)
RBC: 3.15 MIL/uL — ABNORMAL LOW (ref 3.87–5.11)
RDW: 17.1 % — ABNORMAL HIGH (ref 11.5–15.5)
WBC: 6.9 10*3/uL (ref 4.0–10.5)
nRBC: 0 % (ref 0.0–0.2)

## 2021-04-07 LAB — COMPREHENSIVE METABOLIC PANEL
ALT: 9 U/L (ref 0–44)
AST: 19 U/L (ref 15–41)
Albumin: 3 g/dL — ABNORMAL LOW (ref 3.5–5.0)
Alkaline Phosphatase: 75 U/L (ref 38–126)
Anion gap: 13 (ref 5–15)
BUN: 7 mg/dL — ABNORMAL LOW (ref 8–23)
CO2: 27 mmol/L (ref 22–32)
Calcium: 8.3 mg/dL — ABNORMAL LOW (ref 8.9–10.3)
Chloride: 100 mmol/L (ref 98–111)
Creatinine, Ser: 0.81 mg/dL (ref 0.44–1.00)
GFR, Estimated: >60 mL/min (ref >60)
Glucose, Bld: 137 mg/dL — ABNORMAL HIGH (ref 70–99)
Potassium: 3 mmol/L — ABNORMAL LOW (ref 3.5–5.1)
Sodium: 140 mmol/L (ref 135–145)
Total Bilirubin: 0.3 mg/dL (ref 0.3–1.2)
Total Protein: 5.5 g/dL — ABNORMAL LOW (ref 6.5–8.1)

## 2021-04-07 LAB — SAMPLE TO BLOOD BANK

## 2021-04-07 LAB — MAGNESIUM: Magnesium: 1.5 mg/dL — ABNORMAL LOW (ref 1.7–2.4)

## 2021-04-07 MED ORDER — SODIUM CHLORIDE 0.9 % IV SOLN
INTRAVENOUS | Status: AC
  Filled 2021-04-07: qty 250

## 2021-04-07 MED ORDER — MAGNESIUM SULFATE 2 GM/50ML IV SOLN
2.0000 g | Freq: Once | INTRAVENOUS | Status: AC
  Administered 2021-04-07: 2 g via INTRAVENOUS
  Filled 2021-04-07: qty 50

## 2021-04-07 MED ORDER — HEPARIN SOD (PORK) LOCK FLUSH 100 UNIT/ML IV SOLN
500.0000 [IU] | Freq: Once | INTRAVENOUS | Status: AC
  Administered 2021-04-07: 500 [IU] via INTRAVENOUS
  Filled 2021-04-07: qty 5

## 2021-04-07 MED ORDER — SODIUM CHLORIDE 0.9 % IV SOLN
40.0000 meq | Freq: Once | INTRAVENOUS | Status: AC
  Administered 2021-04-07: 40 meq via INTRAVENOUS
  Filled 2021-04-07: qty 20

## 2021-04-07 NOTE — Progress Notes (Signed)
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  She had a difficult post transplant course which required admission to the hospital for 3 days for persistent diarrhea.  She had weight loss and a poor appetite, but her both are trending back up now.  She has a persistent peripheral neuropathy.  She continues to have chronic weakness and fatigue.  She has no other neurologic complaints.  She denies any recent fevers.  She has no chest pain, cough, or hemoptysis.  She denies any nausea, vomiting, constipation.  Her diarrhea is improving.  She has no urinary complaints.  Patient offers no further specific complaints today.  REVIEW OF SYSTEMS:   Review of Systems  Constitutional:  Positive for malaise/fatigue and weight loss. Negative for fever.  Respiratory: Negative.  Negative for cough, hemoptysis and shortness of breath.   Cardiovascular:  Negative for chest pain and leg swelling.  Gastrointestinal:  Positive for diarrhea. Negative for abdominal pain and nausea.  Genitourinary: Negative.  Negative for dysuria.  Musculoskeletal: Negative.  Negative for back pain and joint pain.  Skin: Negative.  Negative for rash.  Neurological:  Positive for weakness. Negative for dizziness, tremors, focal weakness and headaches.  Psychiatric/Behavioral: Negative.  The patient is not nervous/anxious.    As per HPI. Otherwise, a complete review of systems is negative.  PAST MEDICAL HISTORY: Past Medical History:  Diagnosis Date   Anxiety    Diabetes  mellitus without complication ([place]) 1031   GERD (gastroesophageal reflux disease)    Hyperlipidemia    Myeloma ([place])    Personal history of colonic polyps    Squamous cell carcinoma of skin 11/25/2013   Left chest. WD SCC with superficial infiltration.   Tachycardia     PAST SURGICAL HISTORY: Past Surgical History:  Procedure Laterality Date   AUGMENTATION MAMMAPLASTY Bilateral 5945   silicone and replacement in 2001   [place], [place], 1998,2003, 2008, 2013   COLONOSCOPY WITH PROPOFOL N/A 08/16/2016   Procedure: COLONOSCOPY WITH PROPOFOL;  Surgeon: Robert Bellow, MD;  Location: [facility] ENDOSCOPY;  Service: Endoscopy;  Laterality: N/A;   DILATION AND CURETTAGE OF UTERUS     ENDOMETRIAL ABLATION  12/1991   ganglion cyst removal      IR IMAGING GUIDED PORT INSERTION  10/27/2020   IR IMAGING GUIDED PORT INSERTION  04/04/2021   KYPHOPLASTY N/A 10/28/2020   Procedure: T12 and L3 KYPHOPLASTY;  Surgeon: Hessie Knows, MD;  Location: [facility] ORS;  Service: Orthopedics;  Laterality: N/A;   TONSILLECTOMY     WRIST SURGERY Right 05/1995    FAMILY HISTORY: Family History  Problem Relation Age of Onset   Colon polyps Sister    Colon cancer Father 4   Diabetes Mother    Breast cancer Neg Hx     ADVANCED DIRECTIVES (Y/N):  N  HEALTH MAINTENANCE: Social History   Tobacco Use   Smoking status: Former    Packs/day: 1.00    Years: 4.00    Pack years: 4.00    Types: Cigarettes  Quit date: 59    Years since quitting: 41.1   Smokeless tobacco: Never  Vaping Use   Vaping Use: Never used  Substance Use Topics   Alcohol use: Yes    Alcohol/week: 1.0 - 2.0 standard drink    Types: 1 - 2 Glasses of wine per week    Comment: occassionally   Drug use: No     Colonoscopy:  PAP:  Bone density:  Lipid panel:  No Known Allergies  Current Outpatient Medications  Medication Sig Dispense Refill   acyclovir  (ZOVIRAX) 400 MG tablet Take 1 tablet (400 mg total) by mouth 2 (two) times daily. 60 tablet 5   Baclofen 5 MG TABS Take 1 tablet by mouth 3 (three) times daily.     busPIRone (BUSPAR) 30 MG tablet Take 30 mg by mouth 2 (two) times daily.     cyclobenzaprine (FLEXERIL) 10 MG tablet TAKE 1 TABLET BY MOUTH AT BEDTIME 30 tablet 2   cyclobenzaprine (FLEXERIL) 10 MG tablet Take by mouth.     dronabinol (MARINOL) 5 MG capsule Take 5 mg by mouth 2 (two) times daily before a meal.     gabapentin (NEURONTIN) 300 MG capsule Take 300 mg by mouth at bedtime.     lidocaine-prilocaine (EMLA) cream APPLY 1 APPLICATION TOPICALLY AS NEEDED 30 g 2   liraglutide (VICTOZA) 18 MG/3ML SOPN Inject 1.2 mg into the skin daily.     methocarbamol (ROBAXIN) 500 MG tablet Take 1 tablet (500 mg total) by mouth every 6 (six) hours as needed for muscle spasms. 40 tablet 1   metoprolol succinate (TOPROL-XL) 50 MG 24 hr tablet Take 50 mg by mouth 2 (two) times daily. Take with or immediately following a meal.     mirtazapine (REMERON) 15 MG tablet Take 15 mg by mouth at bedtime.     Misc Natural Products (GLUCOSAMINE CHOND CMP TRIPLE) TABS Take 1 tablet by mouth daily.     morphine (MS CONTIN) 15 MG 12 hr tablet TAKE 2 TABLETS BY MOUTH EVERY 12 HOURS 120 tablet 0   omeprazole (PRILOSEC) 40 MG capsule Take 40 mg by mouth daily.     ondansetron (ZOFRAN) 8 MG tablet Take 1 tablet (8 mg total) by mouth 2 (two) times daily as needed (Nausea or vomiting). 60 tablet 2   oxyCODONE (OXY IR/ROXICODONE) 5 MG immediate release tablet Take by mouth.     prochlorperazine (COMPAZINE) 10 MG tablet Take 1 tablet (10 mg total) by mouth every 6 (six) hours as needed (Nausea or vomiting). 60 tablet 2   ASPIRIN 81 PO Take 81 mg by mouth daily. (Patient not taking: Reported on 04/07/2021)     atorvastatin (LIPITOR) 20 MG tablet Take 20 mg by mouth daily. (Patient not taking: Reported on 04/07/2021)     CALCIUM-VITAMIN D PO Take 1 tablet by mouth daily.  (Patient not taking: Reported on 04/07/2021)     Magnesium 200 MG TABS Take 1 tablet by mouth daily. (Patient not taking: Reported on 04/07/2021)     meloxicam (MOBIC) 15 MG tablet Take 15 mg by mouth daily. (Patient not taking: Reported on 04/07/2021)     metFORMIN (GLUCOPHAGE) 500 MG tablet Take 1,000 mg by mouth 2 (two) times daily with a meal. (Patient not taking: Reported on 04/07/2021)     Multiple Vitamin (MULTIVITAMIN WITH MINERALS) TABS tablet Take 1 tablet by mouth daily. (Patient not taking: Reported on 04/07/2021)     oxyCODONE-acetaminophen (PERCOCET/ROXICET) 5-325 MG tablet TAKE 1-2 TABLETS BY  MOUTH EVERY 4 HOURS AS NEEDED FOR SEVERE PAIN (Patient not taking: Reported on 04/07/2021) 90 tablet 0   polyethylene glycol (MIRALAX / GLYCOLAX) 17 g packet Take by mouth daily as needed for mild constipation. (Patient not taking: Reported on 02/03/2021)     traMADol (ULTRAM) 50 MG tablet TAKE 1 TABLET BY MOUTH TWICE DAILY AS NEEDED FOR MODERATE PAIN (Patient not taking: Reported on 04/07/2021) 60 tablet 1   No current facility-administered medications for this visit.   Facility-Administered Medications Ordered in Other Visits  Medication Dose Route Frequency Provider Last Rate Last Admin   0.9 %  sodium chloride infusion   Intravenous Continuous Lloyd Huger, MD   Stopped at 04/07/21 1142   sodium chloride flush (NS) 0.9 % injection 10 mL  10 mL Intravenous PRN Lloyd Huger, MD   10 mL at 11/23/20 0932    OBJECTIVE: Vitals:   04/07/21 1035  BP: 119/61  Pulse: 89  Resp: 16  Temp: (!) 96.3 F (35.7 C)  SpO2: 100%     Body mass index is 21 kg/m.    ECOG FS:0 - Asymptomatic  General: Well-developed, well-nourished, no acute distress. Eyes: Pink conjunctiva, anicteric sclera. HEENT: Normocephalic, moist mucous membranes. Lungs: No audible wheezing or coughing. Heart: Regular rate and rhythm. Abdomen: Soft, nontender, no obvious distention. Musculoskeletal: No edema, cyanosis, or  clubbing. Neuro: Alert, answering all questions appropriately. Cranial nerves grossly intact. Skin: No rashes or petechiae noted. Psych: Normal affect.   LAB RESULTS:  Lab Results  Component Value Date   NA 140 04/07/2021   K 3.0 (L) 04/07/2021   CL 100 04/07/2021   CO2 27 04/07/2021   GLUCOSE 137 (H) 04/07/2021   BUN 7 (L) 04/07/2021   CREATININE 0.81 04/07/2021   CALCIUM 8.3 (L) 04/07/2021   PROT 5.5 (L) 04/07/2021   ALBUMIN 3.0 (L) 04/07/2021   AST 19 04/07/2021   ALT 9 04/07/2021   ALKPHOS 75 04/07/2021   BILITOT 0.3 04/07/2021   GFRNONAA >60 04/07/2021    Lab Results  Component Value Date   WBC 6.9 04/07/2021   NEUTROABS 5.5 04/07/2021   HGB 9.9 (L) 04/07/2021   HCT 30.8 (L) 04/07/2021   MCV 97.8 04/07/2021   PLT 173 04/07/2021     STUDIES: IR IMAGING GUIDED PORT INSERTION  Result Date: 04/04/2021 INDICATION: port placement; myeloma EXAM: Ultrasound-guided puncture of the right internal jugular vein Placement of a right-sided chest port using fluoroscopic guidance MEDICATIONS: None ANESTHESIA/SEDATION: Moderate (conscious) sedation was employed during this procedure. A total of Versed 2 mg and Fentanyl 100 mcg was administered intravenously. Moderate Sedation Time: 28 minutes. The patient's level of consciousness and vital signs were monitored continuously by radiology nursing throughout the procedure under my direct supervision. FLUOROSCOPY TIME:  Fluoroscopy Time: 0.3 minutes (3 mGy) COMPLICATIONS: None immediate. PROCEDURE: Informed written consent was obtained from the patient after a thorough discussion of the procedural risks, benefits and alternatives. All questions were addressed. Maximal Sterile Barrier Technique was utilized including caps, mask, sterile gowns, sterile gloves, sterile drape, hand hygiene and skin antiseptic. A timeout was performed prior to the initiation of the procedure. The patient was placed supine on the exam table. The right neck and chest  was prepped and draped in the standard sterile fashion. A preliminary ultrasound of the right neck was performed and demonstrates a patent right internal jugular vein. A permanent ultrasound image was stored in the electronic medical record. The overlying skin was anesthetized with 1% Lidocaine.  Using ultrasound guidance, access was obtained into the right internal jugular vein using a 21 gauge micropuncture set. A wire was advanced into the SVC, a short incision was made at the puncture site, and serial dilatation performed. Next, in an ipsilateral infraclavicular location, an incision was made at the site of the subcutaneous reservoir. Blunt dissection was used to open a pocket to contain the reservoir. A subcutaneous tunnel was then created from the port site to the puncture site. A(n) 8 Fr single lumen catheter was advanced through the tunnel. The catheter was attached to the port and this was placed in the subcutaneous pocket. Under fluoroscopic guidance, a peel away sheath was placed, and the catheter was trimmed to the appropriate length and was advanced into the central veins. The catheter length is 22 cm. The tip of the catheter lies near the superior cavoatrial junction. The port flushes and aspirates appropriately. The port was flushed and locked with heparinized saline. The port pocket was closed in 2 layers using 3-0 and 4-0 Vicryl/absorbable suture. Dermabond was also applied to both incisions. The patient tolerated the procedure well and was transferred to recovery in stable condition. IMPRESSION: Successful placement of right chest port via the right internal jugular vein. The port is ready for immediate use. Electronically Signed   By: Albin Felling M.D.   On: 04/04/2021 10:52    ONCOLOGY HISTORY:  Diagnosis confirmed from bone biopsy on August 17, 2020.  Initially her lambda free light chains were elevated at 432.1, but now are within normal limits at 12.1.  Immunoglobulins and SPEP are normal.   Bone marrow biopsy completed on August 26, 2020 revealed 25% plasma cells along with the deletion of 1p which is considered poor prognosis.  PET scan results from September 06, 2020 reviewed independently with 3 hypermetabolic lesions in right C5-6 facets, right iliac crest lesion, and L3 vertebral lesion.  After lengthy discussion with the patient, she agreed to pursue chemotherapy with daratumumab, Velcade, Revlimid, and dexamethasone followed by autologous bone marrow transplant.  Patient received weekly daratumumab for 4 cycles along with Velcade on days 1, 4, 8, and 11.  She also received Revlimid on days 1 through 14 of a 21-day cycle.  She completed cycle 4 of treatment on January 14, 2021.  Subsequent bone marrow biopsy on January 24, 2021 revealed complete remission with no evidence of myeloma.    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  PLAN:      1.  Lambda light chain myeloma with 1p del: Patient underwent autologous stem cell transplant at [facility] on February 24, 2021 with Dr. Alvie Heidelberg.  She was readmitted to the hospital on March 04, 2021 with neutropenic fever, diarrhea, and sepsis-like symptoms.  Infectious disease work-up remains negative.  Her blood counts have returned nearly back to normal other than a persistent anemia.  It is unclear if the bone marrow transplant team has recommended maintenance Revlimid.  Patient will start her revaccination schedule as per [facility] transplant team.  Return to clinic in 2 weeks for laboratory work and possible electrolyte replacement and then in 4 weeks for laboratory work, further evaluation and reinitiation of Zometa. 2.  Hypokalemia: Patient will receive 40 mill equivalents of IV potassium today. 3.  Hypomagnesia: Patient will receive 2 g IV magnesium today. 4.  Pain: Improved.  Patient has now completed XRT.  She had kyphoplasty x2.  Better controlled on 30 mg MS Contin every 12 hours.  Patient  continues to  take Percocet as needed sparingly.  8.  A refill of her MS Contin today. 5.  Hypercalcemia: Resolved.  She last received Zometa on January 04, 2021.  Reinitiate treatment at next clinic visit. 6.  Renal insufficiency: Resolved.   7.  Anemia: Chronic and unchanged.  Patient's hemoglobin is 9.9 today.  8.  Syncopal episode/shortness of breath/peripheral edema: Resolved.  Patient's most recent echo on January 01, 2021 reported an EF of 55 to 60%.  Appreciate cardiology input.    Patient expressed understanding and was in agreement with this plan. She also understands that She can call clinic at any time with any questions, concerns, or complaints.      Cancer Staging  Lambda light chain myeloma ([place]) Staging form: Plasma Cell Myeloma and Plasma Cell Disorders, AJCC 8th Edition - Clinical stage from 09/16/2020: RISS Stage I (Beta-2-microglobulin (mg/L): 2.5, Albumin (g/dL): 4.9, ISS: Stage I, High-risk cytogenetics: Absent, LDH: Normal) - Signed by Lloyd Huger, MD on 09/16/2020 Beta 2 microglobulin range (mg/L): Less than 3.5 Albumin range (g/dL): Greater than or equal to 3.5 Cytogenetics: 1p deletion  Lloyd Huger, MD   04/08/2021 10:34 AM

## 2021-04-07 NOTE — Progress Notes (Signed)
Pt recently started taking remeron, marinol and baclofen. Several other medication changes that pt and husband would like to discuss. Pt reports significant weight loss. Of greater than 20 lbs. 3lbs in the last 2 days, although pt reports poor appetite, states that it is improving. Sees dietician at [facility] and feels it is unnecessary to have referral placed here for dietician. 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

## 2021-04-07 NOTE — Patient Instructions (Signed)
[facility]CER CTR AT [place]  Discharge Instructions: Thank you for choosing [facility] to provide your oncology and hematology care.  If you have a lab appointment with the [facility], please go directly to the [facility] [place] and check in at the registration area.  Wear comfortable clothing and clothing appropriate for easy access to any Portacath or PICC line.   We strive to give you quality time with your provider. You may need to reschedule your appointment if you arrive late (15 or more minutes).  Arriving late affects you and other patients whose appointments are after yours.  Also, if you miss three or more appointments without notifying the office, you may be dismissed from the clinic at the providers discretion.      For prescription refill requests, have your pharmacy contact our office and allow 72 hours for refills to be completed.    Today you received the following chemotherapy and/or immunotherapy agents POTASSIUM and MAGNESIUM      To help prevent nausea and vomiting after your treatment, we encourage you to take your nausea medication as directed.  BELOW ARE SYMPTOMS THAT SHOULD BE REPORTED IMMEDIATELY: *FEVER GREATER THAN 100.4 F (38 C) OR HIGHER *CHILLS OR SWEATING *NAUSEA AND VOMITING THAT IS NOT CONTROLLED WITH YOUR NAUSEA MEDICATION *UNUSUAL SHORTNESS OF BREATH *UNUSUAL BRUISING OR BLEEDING *URINARY PROBLEMS (pain or burning when urinating, or frequent urination) *BOWEL PROBLEMS (unusual diarrhea, constipation, pain near the anus) TENDERNESS IN MOUTH AND THROAT WITH OR WITHOUT PRESENCE OF ULCERS (sore throat, sores in mouth, or a toothache) UNUSUAL RASH, SWELLING OR PAIN  UNUSUAL VAGINAL DISCHARGE OR ITCHING   Items with * indicate a potential emergency and should be followed up as soon as possible or go to the Emergency Department if any problems should occur.  Please show the CHEMOTHERAPY ALERT CARD or IMMUNOTHERAPY ALERT CARD at  check-in to the Emergency Department and triage nurse.  Should you have questions after your visit or need to cancel or reschedule your appointment, please contact [facility] [place] AT [place]  [phone] and follow the prompts.  Office hours are 8:00 a.m. to 4:30 p.m. Monday - Friday. Please note that voicemails left after 4:00 p.m. may not be returned until the following business day.  We are closed weekends and major holidays. You have access to a nurse at all times for urgent questions. Please call the main number to the clinic [phone] and follow the prompts.  For any non-urgent questions, you may also contact your provider using MyChart. We now offer e-Visits for anyone 43 and older to request care online for non-urgent symptoms. For details visit [email or web address].   Also download the MyChart app! Go to the app store, search "MyChart", open the app, select [facility], and log in with your MyChart username and password.  Due to Covid, a mask is required upon entering the hospital/clinic. If you do not have a mask, one will be given to you upon arrival. For doctor visits, patients may have 1 support person aged 15 or older with them. For treatment visits, patients cannot have anyone with them due to current Covid guidelines and our immunocompromised population.   Potassium Chloride Injection What is this medication? POTASSIUM CHLORIDE (poe TASS i um KLOOR ide) prevents and treats low levels of potassium in your body. Potassium plays an important role in maintaining the health of your kidneys, heart, muscles, and nervous system. This medicine may be used for other purposes; ask your health  care provider or pharmacist if you have questions. COMMON BRAND NAME(S): PROAMP What should I tell my care team before I take this medication? They need to know if you have any of these conditions: Addison disease Dehydration Diabetes (high blood sugar) Heart disease High  levels of potassium in the blood Irregular heartbeat or rhythm Kidney disease Large areas of burned skin An unusual or allergic reaction to potassium, other medications, foods, dyes, or preservatives Pregnant or trying to get pregnant Breast-feeding How should I use this medication? This medication is injected into a vein. It is given in a hospital or clinic setting. Talk to your care team about the use of this medication in children. Special care may be needed. Overdosage: If you think you have taken too much of this medicine contact a poison control center or emergency room at once. NOTE: This medicine is only for you. Do not share this medicine with others. What if I miss a dose? This does not apply. This medication is not for regular use. What may interact with this medication? Do not take this medication with any of the following: Certain diuretics such as spironolactone, triamterene Eplerenone Sodium polystyrene sulfonate This medication may also interact with the following: Certain medications for blood pressure or heart disease like lisinopril, losartan, quinapril, valsartan Medications that lower your chance of fighting infection such as cyclosporine, tacrolimus NSAIDs, medications for pain and inflammation, like ibuprofen or naproxen Other potassium supplements Salt substitutes This list may not describe all possible interactions. Give your health care provider a list of all the medicines, herbs, non-prescription drugs, or dietary supplements you use. Also tell them if you smoke, drink alcohol, or use illegal drugs. Some items may interact with your medicine. What should I watch for while using this medication? Visit your care team for regular checks on your progress. Tell your care team if your symptoms do not start to get better or if they get worse. You may need blood work while you are taking this medication. Avoid salt substitutes unless you are told otherwise by your care  team. What side effects may I notice from receiving this medication? Side effects that you should report to your care team as soon as possible: Allergic reactions--skin rash, itching, hives, swelling of the face, lips, tongue, or throat High potassium level--muscle weakness, fast or irregular heartbeat Side effects that usually do not require medical attention (report to your care team if they continue or are bothersome): Diarrhea Nausea Stomach pain Vomiting This list may not describe all possible side effects. Call your doctor for medical advice about side effects. You may report side effects to FDA at 1-800-FDA-1088. Where should I keep my medication? This medication is given in a hospital or clinic. It will not be stored at home. NOTE: This sheet is a summary. It may not cover all possible information. If you have questions about this medicine, talk to your doctor, pharmacist, or health care provider.  2022 Elsevier/Gold Standard (2020-06-01 00:00:00)  Magnesium Sulfate Injection What is this medication? MAGNESIUM SULFATE (mag NEE zee um SUL fate) prevents and treats low levels of magnesium in your body. It may also be used to prevent and treat seizures during pregnancy in people with high blood pressure disorders, such as preeclampsia or eclampsia. Magnesium plays an important role in maintaining the health of your muscles and nervous system. This medicine may be used for other purposes; ask your health care provider or pharmacist if you have questions. What should I tell my  care team before I take this medication? They need to know if you have any of these conditions: Heart disease History of irregular heart beat Kidney disease An unusual or allergic reaction to magnesium sulfate, medications, foods, dyes, or preservatives Pregnant or trying to get pregnant Breast-feeding How should I use this medication? This medication is for infusion into a vein. It is given in a hospital or  clinic setting. Talk to your care team about the use of this medication in children. While this medication may be prescribed for selected conditions, precautions do apply. Overdosage: If you think you have taken too much of this medicine contact a poison control center or emergency room at once. NOTE: This medicine is only for you. Do not share this medicine with others. What if I miss a dose? This does not apply. What may interact with this medication? Certain medications for anxiety or sleep Certain medications for seizures like phenobarbital Digoxin Medications that relax muscles for surgery Narcotic medications for pain This list may not describe all possible interactions. Give your health care provider a list of all the medicines, herbs, non-prescription drugs, or dietary supplements you use. Also tell them if you smoke, drink alcohol, or use illegal drugs. Some items may interact with your medicine. What should I watch for while using this medication? Your condition will be monitored carefully while you are receiving this medication. You may need blood work done while you are receiving this medication. What side effects may I notice from receiving this medication? Side effects that you should report to your care team as soon as possible: Allergic reactions--skin rash, itching, hives, swelling of the face, lips, tongue, or throat High magnesium level--confusion, drowsiness, facial flushing, redness, sweating, muscle weakness, fast or irregular heartbeat, trouble breathing Low blood pressure--dizziness, feeling faint or lightheaded, blurry vision Side effects that usually do not require medical attention (report to your care team if they continue or are bothersome): Headache Nausea This list may not describe all possible side effects. Call your doctor for medical advice about side effects. You may report side effects to FDA at 1-800-FDA-1088. Where should I keep my medication? This  medication is given in a hospital or clinic and will not be stored at home. NOTE: This sheet is a summary. It may not cover all possible information. If you have questions about this medicine, talk to your doctor, pharmacist, or health care provider.  2022 Elsevier/Gold Standard (2020-04-29 00:00:00)

## 2021-04-08 ENCOUNTER — Encounter: Payer: Self-pay | Admitting: Oncology

## 2021-04-08 LAB — KAPPA/LAMBDA LIGHT CHAINS
Kappa free light chain: 1.8 mg/L — ABNORMAL LOW (ref 3.3–19.4)
Kappa, lambda light chain ratio: 0.78 (ref 0.26–1.65)
Lambda free light chains: 2.3 mg/L — ABNORMAL LOW (ref 5.7–26.3)

## 2021-04-20 ENCOUNTER — Encounter: Payer: Self-pay | Admitting: Oncology

## 2021-04-21 ENCOUNTER — Inpatient Hospital Stay: Payer: BC Managed Care – PPO

## 2021-04-26 ENCOUNTER — Inpatient Hospital Stay: Payer: BC Managed Care – PPO

## 2021-04-26 ENCOUNTER — Other Ambulatory Visit: Payer: Self-pay | Admitting: Oncology

## 2021-04-26 ENCOUNTER — Other Ambulatory Visit: Payer: Self-pay

## 2021-04-26 VITALS — BP 90/50 | HR 108 | Temp 98.4°F | Resp 17

## 2021-04-26 DIAGNOSIS — E860 Dehydration: Secondary | ICD-10-CM

## 2021-04-26 DIAGNOSIS — C9000 Multiple myeloma not having achieved remission: Secondary | ICD-10-CM

## 2021-04-26 LAB — CBC WITH DIFFERENTIAL/PLATELET
Abs Immature Granulocytes: 0.02 10*3/uL (ref 0.00–0.07)
Basophils Absolute: 0 10*3/uL (ref 0.0–0.1)
Basophils Relative: 1 %
Eosinophils Absolute: 0.2 10*3/uL (ref 0.0–0.5)
Eosinophils Relative: 3 %
HCT: 33.2 % — ABNORMAL LOW (ref 36.0–46.0)
Hemoglobin: 10.7 g/dL — ABNORMAL LOW (ref 12.0–15.0)
Immature Granulocytes: 0 %
Lymphocytes Relative: 13 %
Lymphs Abs: 0.9 10*3/uL (ref 0.7–4.0)
MCH: 32 pg (ref 26.0–34.0)
MCHC: 32.2 g/dL (ref 30.0–36.0)
MCV: 99.4 fL (ref 80.0–100.0)
Monocytes Absolute: 0.6 10*3/uL (ref 0.1–1.0)
Monocytes Relative: 9 %
Neutro Abs: 4.9 10*3/uL (ref 1.7–7.7)
Neutrophils Relative %: 74 %
Platelets: 207 10*3/uL (ref 150–400)
RBC: 3.34 MIL/uL — ABNORMAL LOW (ref 3.87–5.11)
RDW: 16 % — ABNORMAL HIGH (ref 11.5–15.5)
WBC: 6.5 10*3/uL (ref 4.0–10.5)
nRBC: 0 % (ref 0.0–0.2)

## 2021-04-26 LAB — COMPREHENSIVE METABOLIC PANEL
ALT: 18 U/L (ref 0–44)
AST: 41 U/L (ref 15–41)
Albumin: 3.6 g/dL (ref 3.5–5.0)
Alkaline Phosphatase: 76 U/L (ref 38–126)
Anion gap: 8 (ref 5–15)
BUN: 12 mg/dL (ref 8–23)
CO2: 27 mmol/L (ref 22–32)
Calcium: 8.8 mg/dL — ABNORMAL LOW (ref 8.9–10.3)
Chloride: 102 mmol/L (ref 98–111)
Creatinine, Ser: 1.12 mg/dL — ABNORMAL HIGH (ref 0.44–1.00)
GFR, Estimated: 55 mL/min — ABNORMAL LOW (ref >60)
Glucose, Bld: 213 mg/dL — ABNORMAL HIGH (ref 70–99)
Potassium: 4.1 mmol/L (ref 3.5–5.1)
Sodium: 137 mmol/L (ref 135–145)
Total Bilirubin: <0.1 mg/dL — ABNORMAL LOW (ref 0.3–1.2)
Total Protein: 5.9 g/dL — ABNORMAL LOW (ref 6.5–8.1)

## 2021-04-26 LAB — MAGNESIUM: Magnesium: 2 mg/dL (ref 1.7–2.4)

## 2021-04-26 MED ORDER — HEPARIN SOD (PORK) LOCK FLUSH 100 UNIT/ML IV SOLN
500.0000 [IU] | Freq: Once | INTRAVENOUS | Status: AC | PRN
  Administered 2021-04-26: 500 [IU]
  Filled 2021-04-26: qty 5

## 2021-04-26 MED ORDER — SODIUM CHLORIDE 0.9 % IV SOLN
Freq: Once | INTRAVENOUS | Status: AC
  Filled 2021-04-26: qty 250

## 2021-04-26 MED ORDER — SODIUM CHLORIDE 0.9% FLUSH
10.0000 mL | Freq: Once | INTRAVENOUS | Status: AC | PRN
  Administered 2021-04-26: 10 mL
  Filled 2021-04-26: qty 10

## 2021-04-26 NOTE — Progress Notes (Signed)
Established Patient Office Visit  Subjective:  Patient ID: Hannah Mcdonald, female    DOB: August 28, 1955  Age: 66 y.o. MRN: [id number]  CC: No chief complaint on file.   HPI Hannah Mcdonald presents for dehydration  Past Medical History:  Diagnosis Date   Anxiety    Diabetes mellitus without complication ([place]) 2355   GERD (gastroesophageal reflux disease)    Hyperlipidemia    Myeloma ([place])    Personal history of colonic polyps    Squamous cell carcinoma of skin 11/25/2013   Left chest. WD SCC with superficial infiltration.   Tachycardia     Past Surgical History:  Procedure Laterality Date   AUGMENTATION MAMMAPLASTY Bilateral 7322   silicone and replacement in 2001   [place], [place], 1998,2003, 2008, 2013   COLONOSCOPY WITH PROPOFOL N/A 08/16/2016   Procedure: COLONOSCOPY WITH PROPOFOL;  Surgeon: Robert Bellow, MD;  Location: [facility] ENDOSCOPY;  Service: Endoscopy;  Laterality: N/A;   DILATION AND CURETTAGE OF UTERUS     ENDOMETRIAL ABLATION  12/1991   ganglion cyst removal      IR IMAGING GUIDED PORT INSERTION  10/27/2020   IR IMAGING GUIDED PORT INSERTION  04/04/2021   KYPHOPLASTY N/A 10/28/2020   Procedure: T12 and L3 KYPHOPLASTY;  Surgeon: Hessie Knows, MD;  Location: [facility] ORS;  Service: Orthopedics;  Laterality: N/A;   TONSILLECTOMY     WRIST SURGERY Right 05/1995    Family History  Problem Relation Age of Onset   Colon polyps Sister    Colon cancer Father 76   Diabetes Mother    Breast cancer Neg Hx     Social History   Socioeconomic History   Marital status: Married    Spouse name: Not on file   Number of children: Not on file   Years of education: Not on file   Highest education level: Not on file  Occupational History   Not on file  Tobacco Use   Smoking status: Former    Packs/day: 1.00    Years: 4.00    Pack years: 4.00    Types: Cigarettes    Quit date: 21    Years since  quitting: 41.1   Smokeless tobacco: Never  Vaping Use   Vaping Use: Never used  Substance and Sexual Activity   Alcohol use: Yes    Alcohol/week: 1.0 - 2.0 standard drink    Types: 1 - 2 Glasses of wine per week    Comment: occassionally   Drug use: No   Sexual activity: Not Currently  Other Topics Concern   Not on file  Social History Narrative   Not on file   Social Determinants of Health   Financial Resource Strain: Not on file  Food Insecurity: Not on file  Transportation Needs: Not on file  Physical Activity: Not on file  Stress: Not on file  Social Connections: Not on file  Intimate Partner Violence: Not on file    Outpatient Medications Prior to Visit  Medication Sig Dispense Refill   acyclovir (ZOVIRAX) 400 MG tablet Take 1 tablet (400 mg total) by mouth 2 (two) times daily. 60 tablet 5   atorvastatin (LIPITOR) 20 MG tablet Take 20 mg by mouth daily.     Baclofen 5 MG TABS Take 1 tablet by mouth 3 (three) times daily as needed.     busPIRone (BUSPAR) 30 MG tablet Take 30 mg by mouth 2 (two)  times daily.     cyclobenzaprine (FLEXERIL) 10 MG tablet TAKE 1 TABLET BY MOUTH AT BEDTIME 30 tablet 2   dronabinol (MARINOL) 5 MG capsule Take 5 mg by mouth 2 (two) times daily before a meal.     gabapentin (NEURONTIN) 300 MG capsule Take 300 mg by mouth at bedtime.     lidocaine-prilocaine (EMLA) cream APPLY 1 APPLICATION TOPICALLY AS NEEDED 30 g 2   liraglutide (VICTOZA) 18 MG/3ML SOPN Inject 1.2 mg into the skin daily.     Magnesium 400 MG TABS Take 1 tablet by mouth daily.     meloxicam (MOBIC) 15 MG tablet Take 15 mg by mouth daily.     methocarbamol (ROBAXIN) 500 MG tablet Take 1 tablet (500 mg total) by mouth every 6 (six) hours as needed for muscle spasms. 40 tablet 1   metoprolol succinate (TOPROL-XL) 50 MG 24 hr tablet Take 50 mg by mouth. Take 1.5 tabs in am. Take 1 tablet at HS.     morphine (MS CONTIN) 15 MG 12 hr tablet TAKE 2 TABLETS BY MOUTH EVERY 12 HOURS 120  tablet 0   omeprazole (PRILOSEC) 40 MG capsule Take 40 mg by mouth daily.     ondansetron (ZOFRAN) 8 MG tablet Take 1 tablet (8 mg total) by mouth 2 (two) times daily as needed (Nausea or vomiting). 60 tablet 2   oxyCODONE-acetaminophen (PERCOCET/ROXICET) 5-325 MG tablet TAKE 1-2 TABLETS BY MOUTH EVERY 4 HOURS AS NEEDED FOR SEVERE PAIN 90 tablet 0   prochlorperazine (COMPAZINE) 10 MG tablet Take 1 tablet (10 mg total) by mouth every 6 (six) hours as needed (Nausea or vomiting). 60 tablet 2   CALCIUM-VITAMIN D PO Take 1 tablet by mouth daily. (Patient not taking: Reported on 04/07/2021)     polyethylene glycol (MIRALAX / GLYCOLAX) 17 g packet Take by mouth daily as needed for mild constipation. (Patient not taking: Reported on 02/03/2021)     traMADol (ULTRAM) 50 MG tablet TAKE 1 TABLET BY MOUTH TWICE DAILY AS NEEDED FOR MODERATE PAIN (Patient not taking: Reported on 04/07/2021) 60 tablet 1   ASPIRIN 81 PO Take 81 mg by mouth daily. (Patient not taking: Reported on 04/07/2021)     cyclobenzaprine (FLEXERIL) 10 MG tablet Take by mouth.     Magnesium 200 MG TABS Take 1 tablet by mouth daily. (Patient not taking: Reported on 04/07/2021)     metFORMIN (GLUCOPHAGE) 500 MG tablet Take 1,000 mg by mouth 2 (two) times daily with a meal. (Patient not taking: Reported on 04/07/2021)     mirtazapine (REMERON) 15 MG tablet Take 15 mg by mouth at bedtime.     Misc Natural Products (GLUCOSAMINE CHOND CMP TRIPLE) TABS Take 1 tablet by mouth daily.     Multiple Vitamin (MULTIVITAMIN WITH MINERALS) TABS tablet Take 1 tablet by mouth daily. (Patient not taking: Reported on 04/07/2021)     oxyCODONE (OXY IR/ROXICODONE) 5 MG immediate release tablet Take by mouth.     Facility-Administered Medications Prior to Visit  Medication Dose Route Frequency Provider Last Rate Last Admin   0.9 %  sodium chloride infusion   Intravenous Continuous Lloyd Huger, MD   Stopped at 04/07/21 1142   sodium chloride flush (NS) 0.9 % injection  10 mL  10 mL Intravenous PRN Lloyd Huger, MD   10 mL at 11/23/20 0932    No Known Allergies  ROS Review of Systems    Objective:    Physical Exam  BP (!) 90/50  Pulse (!) 108    Temp 98.4 F (36.9 C) (Tympanic)    Resp 17    SpO2 100%  Wt Readings from Last 3 Encounters:  04/07/21 114 lb 12.8 oz (52.1 kg)  04/04/21 138 lb (62.6 kg)  02/03/21 132 lb (59.9 kg)     Health Maintenance Due  Topic Date Due   FOOT EXAM  Never done   OPHTHALMOLOGY EXAM  Never done   URINE MICROALBUMIN  Never done   Hepatitis C Screening  Never done   TETANUS/TDAP  Never done   Zoster Vaccines- Shingrix (1 of 2) Never done   PAP SMEAR-Modifier  Never done   COVID-19 Vaccine (4 - Booster for Pfizer series) 02/05/2020   INFLUENZA VACCINE  09/27/2020    There are no preventive care reminders to display for this patient.  No results found for: TSH Lab Results  Component Value Date   WBC 6.5 04/26/2021   HGB 10.7 (L) 04/26/2021   HCT 33.2 (L) 04/26/2021   MCV 99.4 04/26/2021   PLT 207 04/26/2021   Lab Results  Component Value Date   NA 137 04/26/2021   K 4.1 04/26/2021   CO2 27 04/26/2021   GLUCOSE 213 (H) 04/26/2021   BUN 12 04/26/2021   CREATININE 1.12 (H) 04/26/2021   BILITOT <0.1 (L) 04/26/2021   ALKPHOS 76 04/26/2021   AST 41 04/26/2021   ALT 18 04/26/2021   PROT 5.9 (L) 04/26/2021   ALBUMIN 3.6 04/26/2021   CALCIUM 8.8 (L) 04/26/2021   ANIONGAP 8 04/26/2021   Lab Results  Component Value Date   CHOL 140 12/14/2020   Lab Results  Component Value Date   HDL 48 12/14/2020   Lab Results  Component Value Date   LDLCALC 41 12/14/2020   Lab Results  Component Value Date   TRIG 257 (H) 12/14/2020   Lab Results  Component Value Date   CHOLHDL 2.9 12/14/2020   Lab Results  Component Value Date   HGBA1C 6.9 (H) 12/14/2020      Assessment & Plan:   Problem List Items Addressed This Visit       Other   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    Mayer Camel, RNPt here for labs and fluids. Chronic, ongoing, intermittent dizzy spells. HR has been elevated. MD increased Toprol dosage which has probably lowered her BP as well.Marland Kitchen Has intermittent nausea 1-2 x weekly. Bowels regular. Has Back pain under right scapula area relieved by muscle relaxer and pain medication. Drank an ensure while getting fluids. Instructed pt to try and drink more fluids. Discharged to home feeling well. Denies any dizziness.

## 2021-04-28 ENCOUNTER — Encounter: Payer: Self-pay | Admitting: Oncology

## 2021-04-28 NOTE — Progress Notes (Signed)
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  INTERVAL HISTORY: Patient returns to clinic today for further evaluation and reinitiation of Zometa.  She feels significantly improved over the past several weeks.  She continues to have a mild peripheral neuropathy, but no other neurologic complaints.  She denies any recent fevers.  She has no chest pain, shortness of breath, cough, or hemoptysis.  She denies any nausea, vomiting, constipation.  Her diarrhea has resolved.  She has no urinary complaints.  Patient offers no further specific complaints today.  REVIEW OF SYSTEMS:   Review of Systems  Constitutional: Negative.  Negative for fever, malaise/fatigue and weight loss.  Respiratory: Negative.  Negative for cough, hemoptysis and shortness of breath.   Cardiovascular:  Negative for chest pain and leg swelling.  Gastrointestinal: Negative.  Negative for abdominal pain, diarrhea and nausea.  Genitourinary: Negative.  Negative for dysuria.  Musculoskeletal: Negative.  Negative for back pain and joint pain.  Skin: Negative.  Negative for rash.  Neurological:  Positive for sensory change and weakness. Negative for dizziness, tremors, focal weakness and headaches.  Psychiatric/Behavioral: Negative.  The patient is not nervous/anxious.    As per HPI. Otherwise, a complete review of systems is negative.  PAST MEDICAL HISTORY: Past Medical History:  Diagnosis Date   Anxiety    Diabetes mellitus without complication ([place]) 9030   GERD (gastroesophageal reflux disease)    Hyperlipidemia    Myeloma ([place])    Personal history of colonic polyps    Squamous cell  carcinoma of skin 11/25/2013   Left chest. WD SCC with superficial infiltration.   Tachycardia     PAST SURGICAL HISTORY: Past Surgical History:  Procedure Laterality Date   AUGMENTATION MAMMAPLASTY Bilateral 0923   silicone and replacement in 2001   [place], [place], 1998,2003, 2008, 2013   COLONOSCOPY WITH PROPOFOL N/A 08/16/2016   Procedure: COLONOSCOPY WITH PROPOFOL;  Surgeon: Robert Bellow, MD;  Location: [facility] ENDOSCOPY;  Service: Endoscopy;  Laterality: N/A;   DILATION AND CURETTAGE OF UTERUS     ENDOMETRIAL ABLATION  12/1991   ganglion cyst removal      IR IMAGING GUIDED PORT INSERTION  10/27/2020   IR IMAGING GUIDED PORT INSERTION  04/04/2021   KYPHOPLASTY N/A 10/28/2020   Procedure: T12 and L3 KYPHOPLASTY;  Surgeon: Hessie Knows, MD;  Location: [facility] ORS;  Service: Orthopedics;  Laterality: N/A;   TONSILLECTOMY     WRIST SURGERY Right 05/1995    FAMILY HISTORY: Family History  Problem Relation Age of Onset   Colon polyps Sister    Colon cancer Father 43   Diabetes Mother    Breast cancer Neg Hx     ADVANCED DIRECTIVES (Y/N):  N  HEALTH MAINTENANCE: Social History   Tobacco Use   Smoking status: Former    Packs/day: 1.00    Years: 4.00    Pack years: 4.00    Types: Cigarettes    Quit date: 1982    Years since quitting: 41.2   Smokeless tobacco: Never  Vaping Use   Vaping Use: Never used  Substance Use Topics   Alcohol  use: Yes    Alcohol/week: 1.0 - 2.0 standard drink    Types: 1 - 2 Glasses of wine per week    Comment: occassionally   Drug use: No     Colonoscopy:  PAP:  Bone density:  Lipid panel:  No Known Allergies  Current Outpatient Medications  Medication Sig Dispense Refill   acyclovir (ZOVIRAX) 400 MG tablet Take 1 tablet (400 mg total) by mouth 2 (two) times daily. 60 tablet 5   atorvastatin (LIPITOR) 20 MG tablet Take 20 mg by mouth daily.     Baclofen 5 MG TABS  Take 1 tablet by mouth 3 (three) times daily as needed.     busPIRone (BUSPAR) 30 MG tablet Take 30 mg by mouth 2 (two) times daily.     cyclobenzaprine (FLEXERIL) 10 MG tablet TAKE 1 TABLET BY MOUTH AT BEDTIME 30 tablet 2   dronabinol (MARINOL) 5 MG capsule Take 5 mg by mouth 2 (two) times daily before a meal.     gabapentin (NEURONTIN) 300 MG capsule Take 300 mg by mouth at bedtime.     lidocaine-prilocaine (EMLA) cream APPLY 1 APPLICATION TOPICALLY AS NEEDED 30 g 2   liraglutide (VICTOZA) 18 MG/3ML SOPN Inject 1.2 mg into the skin daily.     Magnesium 400 MG TABS Take 1 tablet by mouth daily.     meloxicam (MOBIC) 15 MG tablet Take 15 mg by mouth daily.     methocarbamol (ROBAXIN) 500 MG tablet Take 1 tablet (500 mg total) by mouth every 6 (six) hours as needed for muscle spasms. 40 tablet 1   metoprolol succinate (TOPROL-XL) 50 MG 24 hr tablet Take 50 mg by mouth. Take 1.5 tabs in am. Take 1 tablet at HS.     metoprolol succinate (TOPROL-XL) 50 MG 24 hr tablet 75 mg every morning Take 49m nightly     morphine (MS CONTIN) 15 MG 12 hr tablet TAKE 2 TABLETS BY MOUTH EVERY 12 HOURS 120 tablet 0   omeprazole (PRILOSEC) 40 MG capsule Take 40 mg by mouth daily.     ondansetron (ZOFRAN) 8 MG tablet Take 1 tablet (8 mg total) by mouth 2 (two) times daily as needed (Nausea or vomiting). 60 tablet 2   oxyCODONE-acetaminophen (PERCOCET/ROXICET) 5-325 MG tablet TAKE 1-2 TABLETS BY MOUTH EVERY 4 HOURS AS NEEDED FOR SEVERE PAIN 90 tablet 0   polyethylene glycol (MIRALAX / GLYCOLAX) 17 g packet Take by mouth daily as needed for mild constipation.     CALCIUM-VITAMIN D PO Take 1 tablet by mouth daily. (Patient not taking: Reported on 04/07/2021)     prochlorperazine (COMPAZINE) 10 MG tablet Take 1 tablet (10 mg total) by mouth every 6 (six) hours as needed (Nausea or vomiting). (Patient not taking: Reported on 05/05/2021) 60 tablet 2   traMADol (ULTRAM) 50 MG tablet TAKE 1 TABLET BY MOUTH TWICE DAILY AS NEEDED  FOR MODERATE PAIN (Patient not taking: Reported on 04/07/2021) 60 tablet 1   No current facility-administered medications for this visit.   Facility-Administered Medications Ordered in Other Visits  Medication Dose Route Frequency Provider Last Rate Last Admin   0.9 %  sodium chloride infusion   Intravenous Continuous FLloyd Huger MD   Stopped at 04/07/21 1142   sodium chloride flush (NS) 0.9 % injection 10 mL  10 mL Intravenous PRN FLloyd Huger MD   10 mL at 11/23/20 0932    OBJECTIVE: Vitals:   05/05/21 1024  BP: (!) 101/52  Pulse: 85  Temp: 98.7 F (37.1 C)     Body mass index is 20.52 kg/m.    ECOG FS:0 - Asymptomatic  General: Well-developed, well-nourished, no acute distress. Eyes: Pink conjunctiva, anicteric sclera. HEENT: Normocephalic, moist mucous membranes. Lungs: No audible wheezing or coughing. Heart: Regular rate and rhythm. Abdomen: Soft, nontender, no obvious distention. Musculoskeletal: No edema, cyanosis, or clubbing. Neuro: Alert, answering all questions appropriately. Cranial nerves grossly intact. Skin: No rashes or petechiae noted. Psych: Normal affect.   LAB RESULTS:  Lab Results  Component Value Date   NA 137 05/05/2021   K 3.6 05/05/2021   CL 103 05/05/2021   CO2 25 05/05/2021   GLUCOSE 210 (H) 05/05/2021   BUN 18 05/05/2021   CREATININE 0.82 05/05/2021   CALCIUM 9.1 05/05/2021   PROT 6.3 (L) 05/05/2021   ALBUMIN 3.7 05/05/2021   AST 42 (H) 05/05/2021   ALT 21 05/05/2021   ALKPHOS 91 05/05/2021   BILITOT 0.5 05/05/2021   GFRNONAA >60 05/05/2021    Lab Results  Component Value Date   WBC 7.0 05/05/2021   NEUTROABS 5.8 05/05/2021   HGB 11.5 (L) 05/05/2021   HCT 35.0 (L) 05/05/2021   MCV 101.2 (H) 05/05/2021   PLT 166 05/05/2021     STUDIES: No results found.  ONCOLOGY HISTORY:  Diagnosis confirmed from bone biopsy on August 17, 2020.  Initially her lambda free light chains were elevated at 432.1, but now are within  normal limits at 12.1.  Immunoglobulins and SPEP are normal.  Bone marrow biopsy completed on August 26, 2020 revealed 25% plasma cells along with the deletion of 1p which is considered poor prognosis.  PET scan results from September 06, 2020 reviewed independently with 3 hypermetabolic lesions in right C5-6 facets, right iliac crest lesion, and L3 vertebral lesion.  After lengthy discussion with the patient, she agreed to pursue chemotherapy with daratumumab, Velcade, Revlimid, and dexamethasone followed by autologous bone marrow transplant.  Patient received weekly daratumumab for 4 cycles along with Velcade on days 1, 4, 8, and 11.  She also received Revlimid on days 1 through 14 of a 21-day cycle.  She completed cycle 4 of treatment on January 14, 2021.  Subsequent bone marrow biopsy on January 24, 2021 revealed complete remission with no evidence of myeloma.    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  PLAN:      1.  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 Alvie Heidelberg.  She was readmitted to the hospital on March 04, 2021 with neutropenic fever, diarrhea, and sepsis-like symptoms.  Infectious disease work-up remains negative.  Her blood counts have essentially resolved.  We discussed at length initiating Revlimid 10 mg daily as maintenance regimen, but will wait until patient has her follow-up appointment at [facility] to initiate.  Proceed with Zometa today.  Return to clinic in 5 weeks after her transplant appointment for further evaluation and continuation of Zometa.   2.  Hypokalemia: Resolved. 3.  Hypomagnesia: Resolved. 4.  Pain: Patient has persistent thoracic back pain, therefore will get MRI to further evaluate.  She has completed XRT.  She had kyphoplasty x2.  Continue current narcotics as prescribed.   5.  Hypercalcemia: Resolved.  Proceed with Zometa  today. 6.  Renal insufficiency: Resolved.   7.  Anemia: Hemoglobin continues to improve and is now 11.5.  Patient expressed understanding and was in  agreement with this plan. She also understands that She can call clinic at any time with any questions, concerns, or complaints.      Cancer Staging  Lambda light chain myeloma ([place]) Staging form: Plasma Cell Myeloma and Plasma Cell Disorders, AJCC 8th Edition - Clinical stage from 09/16/2020: RISS Stage I (Beta-2-microglobulin (mg/L): 2.5, Albumin (g/dL): 4.9, ISS: Stage I, High-risk cytogenetics: Absent, LDH: Normal) - Signed by Lloyd Huger, MD on 09/16/2020 Beta 2 microglobulin range (mg/L): Less than 3.5 Albumin range (g/dL): Greater than or equal to 3.5 Cytogenetics: 1p deletion  Lloyd Huger, MD   05/05/2021 1:26 PM

## 2021-05-05 ENCOUNTER — Other Ambulatory Visit: Payer: Self-pay

## 2021-05-05 ENCOUNTER — Encounter: Payer: Self-pay | Admitting: Oncology

## 2021-05-05 ENCOUNTER — Inpatient Hospital Stay: Payer: Medicare PPO

## 2021-05-05 ENCOUNTER — Inpatient Hospital Stay: Payer: Medicare PPO | Admitting: Oncology

## 2021-05-05 ENCOUNTER — Inpatient Hospital Stay: Payer: Medicare PPO | Attending: Oncology

## 2021-05-05 VITALS — BP 101/52 | HR 85 | Temp 98.7°F | Wt 112.2 lb

## 2021-05-05 DIAGNOSIS — C9000 Multiple myeloma not having achieved remission: Secondary | ICD-10-CM | POA: Diagnosis present

## 2021-05-05 DIAGNOSIS — Z79899 Other long term (current) drug therapy: Secondary | ICD-10-CM | POA: Insufficient documentation

## 2021-05-05 DIAGNOSIS — Z923 Personal history of irradiation: Secondary | ICD-10-CM | POA: Diagnosis not present

## 2021-05-05 DIAGNOSIS — Z9484 Stem cells transplant status: Secondary | ICD-10-CM | POA: Diagnosis not present

## 2021-05-05 DIAGNOSIS — D649 Anemia, unspecified: Secondary | ICD-10-CM | POA: Diagnosis not present

## 2021-05-05 LAB — SAMPLE TO BLOOD BANK

## 2021-05-05 LAB — CBC WITH DIFFERENTIAL/PLATELET
Abs Immature Granulocytes: 0.03 10*3/uL (ref 0.00–0.07)
Basophils Absolute: 0 10*3/uL (ref 0.0–0.1)
Basophils Relative: 0 %
Eosinophils Absolute: 0.3 10*3/uL (ref 0.0–0.5)
Eosinophils Relative: 4 %
HCT: 35 % — ABNORMAL LOW (ref 36.0–46.0)
Hemoglobin: 11.5 g/dL — ABNORMAL LOW (ref 12.0–15.0)
Immature Granulocytes: 0 %
Lymphocytes Relative: 6 %
Lymphs Abs: 0.5 10*3/uL — ABNORMAL LOW (ref 0.7–4.0)
MCH: 33.2 pg (ref 26.0–34.0)
MCHC: 32.9 g/dL (ref 30.0–36.0)
MCV: 101.2 fL — ABNORMAL HIGH (ref 80.0–100.0)
Monocytes Absolute: 0.5 10*3/uL (ref 0.1–1.0)
Monocytes Relative: 6 %
Neutro Abs: 5.8 10*3/uL (ref 1.7–7.7)
Neutrophils Relative %: 84 %
Platelets: 166 10*3/uL (ref 150–400)
RBC: 3.46 MIL/uL — ABNORMAL LOW (ref 3.87–5.11)
RDW: 15.3 % (ref 11.5–15.5)
WBC: 7 10*3/uL (ref 4.0–10.5)
nRBC: 0 % (ref 0.0–0.2)

## 2021-05-05 LAB — COMPREHENSIVE METABOLIC PANEL
ALT: 21 U/L (ref 0–44)
AST: 42 U/L — ABNORMAL HIGH (ref 15–41)
Albumin: 3.7 g/dL (ref 3.5–5.0)
Alkaline Phosphatase: 91 U/L (ref 38–126)
Anion gap: 9 (ref 5–15)
BUN: 18 mg/dL (ref 8–23)
CO2: 25 mmol/L (ref 22–32)
Calcium: 9.1 mg/dL (ref 8.9–10.3)
Chloride: 103 mmol/L (ref 98–111)
Creatinine, Ser: 0.82 mg/dL (ref 0.44–1.00)
GFR, Estimated: >60 mL/min (ref >60)
Glucose, Bld: 210 mg/dL — ABNORMAL HIGH (ref 70–99)
Potassium: 3.6 mmol/L (ref 3.5–5.1)
Sodium: 137 mmol/L (ref 135–145)
Total Bilirubin: 0.5 mg/dL (ref 0.3–1.2)
Total Protein: 6.3 g/dL — ABNORMAL LOW (ref 6.5–8.1)

## 2021-05-05 LAB — MAGNESIUM: Magnesium: 1.9 mg/dL (ref 1.7–2.4)

## 2021-05-05 MED ORDER — ZOLEDRONIC ACID 4 MG/100ML IV SOLN
3.0000 mg | Freq: Once | INTRAVENOUS | Status: AC
  Administered 2021-05-05: 11:00:00 3 mg via INTRAVENOUS

## 2021-05-05 MED ORDER — ZOLEDRONIC ACID 4 MG/100ML IV SOLN: 4.0000 mg | Freq: Once | INTRAVENOUS | Status: DC

## 2021-05-05 MED ORDER — SODIUM CHLORIDE 0.9 % IV SOLN
Freq: Once | INTRAVENOUS | Status: AC
  Filled 2021-05-05: qty 250

## 2021-05-05 MED ORDER — HEPARIN SOD (PORK) LOCK FLUSH 100 UNIT/ML IV SOLN
500.0000 [IU] | Freq: Once | INTRAVENOUS | Status: AC | PRN
  Administered 2021-05-05: 12:00:00 500 [IU]
  Filled 2021-05-05: qty 5

## 2021-05-06 LAB — KAPPA/LAMBDA LIGHT CHAINS
Kappa free light chain: 1.8 mg/L — ABNORMAL LOW (ref 3.3–19.4)
Kappa, lambda light chain ratio: 0.82 (ref 0.26–1.65)
Lambda free light chains: 2.2 mg/L — ABNORMAL LOW (ref 5.7–26.3)

## 2021-05-25 ENCOUNTER — Other Ambulatory Visit: Payer: Self-pay | Admitting: Oncology

## 2021-05-25 DIAGNOSIS — C9000 Multiple myeloma not having achieved remission: Secondary | ICD-10-CM

## 2021-05-26 ENCOUNTER — Ambulatory Visit
Admission: RE | Admit: 2021-05-26 | Discharge: 2021-05-26 | Disposition: A | Discharge status: Home / Self Care | Payer: Medicare PPO | Source: Ambulatory Visit | Attending: Oncology | Admitting: Oncology

## 2021-05-26 DIAGNOSIS — C9000 Multiple myeloma not having achieved remission: Secondary | ICD-10-CM | POA: Diagnosis not present

## 2021-05-26 MED ORDER — GADOBUTROL 1 MMOL/ML IV SOLN
5.0000 mL | Freq: Once | INTRAVENOUS | Status: AC | PRN
  Administered 2021-05-26: 5 mL via INTRAVENOUS

## 2021-05-26 MED ORDER — HEPARIN SOD (PORK) LOCK FLUSH 100 UNIT/ML IV SOLN
INTRAVENOUS | Status: AC
  Filled 2021-05-26: qty 5

## 2021-05-26 MED ORDER — HEPARIN SOD (PORK) LOCK FLUSH 100 UNIT/ML IV SOLN
500.0000 [IU] | Freq: Once | INTRAVENOUS | Status: AC
  Administered 2021-05-26: 500 [IU] via INTRAVENOUS
  Filled 2021-05-26: qty 5

## 2021-06-02 ENCOUNTER — Ambulatory Visit: Payer: Medicare PPO

## 2021-06-02 ENCOUNTER — Ambulatory Visit: Payer: Medicare PPO | Admitting: Oncology

## 2021-06-02 ENCOUNTER — Other Ambulatory Visit: Payer: Medicare PPO

## 2021-06-09 ENCOUNTER — Inpatient Hospital Stay: Payer: Medicare PPO

## 2021-06-09 ENCOUNTER — Inpatient Hospital Stay: Payer: Medicare PPO | Attending: Oncology | Admitting: Oncology

## 2021-06-09 VITALS — BP 91/54 | HR 95 | Resp 16

## 2021-06-09 VITALS — BP 97/62 | HR 104 | Temp 97.3°F | Resp 16 | Ht 62.0 in | Wt 114.1 lb

## 2021-06-09 DIAGNOSIS — Z9484 Stem cells transplant status: Secondary | ICD-10-CM | POA: Diagnosis not present

## 2021-06-09 DIAGNOSIS — C9000 Multiple myeloma not having achieved remission: Secondary | ICD-10-CM

## 2021-06-09 DIAGNOSIS — C9001 Multiple myeloma in remission: Secondary | ICD-10-CM | POA: Insufficient documentation

## 2021-06-09 DIAGNOSIS — Z5112 Encounter for antineoplastic immunotherapy: Secondary | ICD-10-CM | POA: Diagnosis present

## 2021-06-09 DIAGNOSIS — Z79899 Other long term (current) drug therapy: Secondary | ICD-10-CM | POA: Diagnosis not present

## 2021-06-09 MED ORDER — SODIUM CHLORIDE 0.9 % IV SOLN
Freq: Once | INTRAVENOUS | Status: AC
  Filled 2021-06-09: qty 250

## 2021-06-09 MED ORDER — ZOLEDRONIC ACID 4 MG/5ML IV CONC
3.0000 mg | Freq: Once | INTRAVENOUS | Status: AC
  Administered 2021-06-09: 3 mg via INTRAVENOUS
  Filled 2021-06-09: qty 3.75

## 2021-06-09 MED ORDER — HEPARIN SOD (PORK) LOCK FLUSH 100 UNIT/ML IV SOLN
500.0000 [IU] | Freq: Once | INTRAVENOUS | Status: AC | PRN
  Administered 2021-06-09: 500 [IU]
  Filled 2021-06-09: qty 5

## 2021-06-09 NOTE — Progress Notes (Signed)
Communication 1 accepted this ?[place]  ?Telephone:(336) [phone] [phone] ? ?ID: Hannah Mcdonald OB: March 21, 1955  MR#: [id number]  ZYS#:[id number] ? ?Patient Care Team: ?Albina Billet, MD as PCP - General (Internal Medicine) ?Robert Bellow, MD as Consulting Physician (General Surgery) ? ? 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 ? ?INTERVAL HISTORY: Patient returns to clinic today for further evaluation, continuation of Zometa, and discussion of her post transplant treatment.  She currently feels well and is asymptomatic.  She continues to have a mild peripheral neuropathy, but no other neurologic complaints.  She denies any recent fevers.  She has no chest pain, shortness of breath, cough, or hemoptysis.  She denies any nausea, vomiting, constipation.  Her diarrhea has resolved.  She has no urinary complaints.  Patient offers no further specific complaints today. ? ?REVIEW OF SYSTEMS:   ?Review of Systems  ?Constitutional: Negative.  Negative for fever, malaise/fatigue and weight loss.  ?Respiratory: Negative.  Negative for cough, hemoptysis and shortness of breath.   ?Cardiovascular:  Negative for chest pain and leg swelling.  ?Gastrointestinal: Negative.  Negative for abdominal pain, diarrhea and nausea.  ?Genitourinary: Negative.  Negative for dysuria.  ?Musculoskeletal: Negative.  Negative for back pain and joint pain.  ?Skin: Negative.  Negative for rash.  ?Neurological:  Positive for sensory change. Negative for dizziness, tremors, focal weakness, weakness and headaches.  ?Psychiatric/Behavioral: Negative.  The patient is not nervous/anxious.   ? ?As per HPI. Otherwise, a complete review of systems is negative. ? ?PAST MEDICAL HISTORY: ?Past Medical History:  ?Diagnosis Date  ? Anxiety   ? Diabetes mellitus without complication ([place]) 9323  ? GERD (gastroesophageal reflux disease)   ? Hyperlipidemia   ? Myeloma ([place])   ? Personal history of  colonic polyps   ? Squamous cell carcinoma of skin 11/25/2013  ? Left chest. WD SCC with superficial infiltration.  ? Tachycardia   ? ? ?PAST SURGICAL HISTORY: ?Past Surgical History:  ?Procedure Laterality Date  ? AUGMENTATION MAMMAPLASTY Bilateral 1983  ? silicone and replacement in 2001  ? [place]  ? [place]  ? COLONOSCOPY  1988, [id number], 2008, 2013  ? COLONOSCOPY WITH PROPOFOL N/A 08/16/2016  ? Procedure: COLONOSCOPY WITH PROPOFOL;  Surgeon: Robert Bellow, MD;  Location: [facility] ENDOSCOPY;  Service: Endoscopy;  Laterality: N/A;  ? DILATION AND CURETTAGE OF UTERUS    ? ENDOMETRIAL ABLATION  12/1991  ? ganglion cyst removal     ? IR IMAGING GUIDED PORT INSERTION  10/27/2020  ? IR IMAGING GUIDED PORT INSERTION  04/04/2021  ? KYPHOPLASTY N/A 10/28/2020  ? Procedure: T12 and L3 KYPHOPLASTY;  Surgeon: Hessie Knows, MD;  Location: [facility] ORS;  Service: Orthopedics;  Laterality: N/A;  ? TONSILLECTOMY    ? WRIST SURGERY Right 05/1995  ? ? ?FAMILY HISTORY: ?Family History  ?Problem Relation Age of Onset  ? Colon polyps Sister   ? Colon cancer Father 18  ? Diabetes Mother   ? Breast cancer Neg Hx   ? ? ?ADVANCED DIRECTIVES (Y/N):  N ? ?HEALTH MAINTENANCE: ?Social History  ? ?Tobacco Use  ? Smoking status: Former  ?  Packs/day: 1.00  ?  Years: 4.00  ?  Pack years: 4.00  ?  Types: Cigarettes  ?  Quit date: 57  ?  Years since quitting: 41.3  ? Smokeless tobacco: Never  ?Vaping Use  ? Vaping Use: Never used  ?Substance Use Topics  ?  Alcohol use: Yes  ?  Alcohol/week: 1.0 - 2.0 standard drink  ?  Types: 1 - 2 Glasses of wine per week  ?  Comment: occassionally  ? Drug use: No  ? ? ? Colonoscopy: ? PAP: ? Bone density: ? Lipid panel: ? ?No Known Allergies ? ?Current Outpatient Medications  ?Medication Sig Dispense Refill  ? acyclovir (ZOVIRAX) 400 MG tablet TAKE 1 TABLET BY MOUTH TWICE DAILY 60 tablet 5  ? atorvastatin (LIPITOR) 20 MG tablet Take 20 mg by mouth daily.    ? busPIRone  (BUSPAR) 30 MG tablet Take 30 mg by mouth 2 (two) times daily.    ? cyclobenzaprine (FLEXERIL) 10 MG tablet TAKE 1 TABLET BY MOUTH AT BEDTIME 30 tablet 2  ? gabapentin (NEURONTIN) 300 MG capsule Take 300 mg by mouth at bedtime.    ? lidocaine-prilocaine (EMLA) cream APPLY 1 APPLICATION TOPICALLY AS NEEDED 30 g 2  ? liraglutide (VICTOZA) 18 MG/3ML SOPN Inject 1.2 mg into the skin daily.    ? Magnesium 400 MG TABS Take 1 tablet by mouth daily.    ? meloxicam (MOBIC) 15 MG tablet Take 15 mg by mouth daily.    ? methocarbamol (ROBAXIN) 500 MG tablet Take 1 tablet (500 mg total) by mouth every 6 (six) hours as needed for muscle spasms. 40 tablet 1  ? metoprolol succinate (TOPROL-XL) 50 MG 24 hr tablet Take 50 mg by mouth. Take 1.5 tabs in am. Take 1 tablet at HS.    ? metoprolol succinate (TOPROL-XL) 50 MG 24 hr tablet 75 mg every morning Take '50mg'$  nightly    ? morphine (MS CONTIN) 15 MG 12 hr tablet TAKE 2 TABLETS BY MOUTH EVERY 12 HOURS 120 tablet 0  ? Multiple Vitamin (MULTI-VITAMIN) tablet Take 1 tablet by mouth daily.    ? omeprazole (PRILOSEC) 40 MG capsule Take 40 mg by mouth daily.    ? ondansetron (ZOFRAN) 8 MG tablet Take 1 tablet (8 mg total) by mouth 2 (two) times daily as needed (Nausea or vomiting). 60 tablet 2  ? oxyCODONE-acetaminophen (PERCOCET/ROXICET) 5-325 MG tablet TAKE 1-2 TABLETS BY MOUTH EVERY 4 HOURS AS NEEDED FOR SEVERE PAIN 90 tablet 0  ? prochlorperazine (COMPAZINE) 10 MG tablet Take 1 tablet (10 mg total) by mouth every 6 (six) hours as needed (Nausea or vomiting). 60 tablet 2  ? traMADol (ULTRAM) 50 MG tablet TAKE 1 TABLET BY MOUTH TWICE DAILY AS NEEDED FOR MODERATE PAIN 60 tablet 1  ? ?No current facility-administered medications for this visit.  ? ?Facility-Administered Medications Ordered in Other Visits  ?Medication Dose Route Frequency Provider Last Rate Last Admin  ? 0.9 %  sodium chloride infusion   Intravenous Continuous Lloyd Huger, MD   Stopped at 04/07/21 1142  ? sodium  chloride flush (NS) 0.9 % injection 10 mL  10 mL Intravenous PRN Lloyd Huger, MD   10 mL at 11/23/20 0932  ? ? ?OBJECTIVE: ?Vitals:  ? 06/09/21 0931  ?BP: 97/62  ?Pulse: (!) 104  ?Resp: 16  ?Temp: (!) 97.3 ?F (36.3 ?C)  ?SpO2: 99%  ?   Body mass index is 20.87 kg/m?Marland Kitchen    ECOG FS:0 - Asymptomatic ? ?General: Well-developed, well-nourished, no acute distress. ?Eyes: Pink conjunctiva, anicteric sclera. ?HEENT: Normocephalic, moist mucous membranes. ?Lungs: No audible wheezing or coughing. ?Heart: Regular rate and rhythm. ?Abdomen: Soft, nontender, no obvious distention. ?Musculoskeletal: No edema, cyanosis, or clubbing. ?Neuro: Alert, answering all questions appropriately. Cranial nerves grossly intact. ?Skin: No  rashes or petechiae noted. ?Psych: Normal affect. ? ? ?LAB RESULTS: ? ?Lab Results  ?Component Value Date  ? NA 137 05/05/2021  ? K 3.6 05/05/2021  ? CL 103 05/05/2021  ? CO2 25 05/05/2021  ? GLUCOSE 210 (H) 05/05/2021  ? BUN 18 05/05/2021  ? CREATININE 0.82 05/05/2021  ? CALCIUM 9.1 05/05/2021  ? PROT 6.3 (L) 05/05/2021  ? ALBUMIN 3.7 05/05/2021  ? AST 42 (H) 05/05/2021  ? ALT 21 05/05/2021  ? ALKPHOS 91 05/05/2021  ? BILITOT 0.5 05/05/2021  ? GFRNONAA >60 05/05/2021  ? ? ?Lab Results  ?Component Value Date  ? WBC 7.0 05/05/2021  ? NEUTROABS 5.8 05/05/2021  ? HGB 11.5 (L) 05/05/2021  ? HCT 35.0 (L) 05/05/2021  ? MCV 101.2 (H) 05/05/2021  ? PLT 166 05/05/2021  ? ? ? ?STUDIES: ?MR Thoracic Spine W Wo Contrast ? ?Result Date: 05/26/2021 ?CLINICAL DATA:  Pain, history of myeloma EXAM: MRI THORACIC WITHOUT AND WITH CONTRAST TECHNIQUE: Multiplanar and multiecho pulse sequences of the thoracic spine were obtained without and with intravenous contrast. CONTRAST:  67m GADAVIST GADOBUTROL 1 MMOL/ML IV SOLN COMPARISON:  Thoracic spine MRI 10/19/2020 CT chest 01/24/2021 FINDINGS: Alignment: There is no antero or retrolisthesis. There is minimal levoscoliosis. Vertebrae: Previously seen T1 hypointense, stir  hyperintense, enhancing lesions throughout the thoracic spine bone marrow have overall decreased in conspicuity. Discrete lesions remain visible in the T8 and T10 vertebral bodies measuring approximately 6 mm at bot

## 2021-06-09 NOTE — Progress Notes (Signed)
DISCONTINUE ON PATHWAY REGIMEN - Multiple Myeloma and Other Plasma Cell Dyscrasias ? ? ?DaraVRd (Daratumumab SUBQ + Bortezomib SUBQ + Lenalidomide PO + Dexamethasone IV/PO) q21 Days (Induction Schema): ?  A cycle is every 21 days: ?    Lenalidomide  ?    Dexamethasone  ?    Bortezomib  ?    Daratumumab and hyaluronidase-fihj  ? ? ?DaraVRd (Daratumumab SUBQ + Bortezomib SUBQ + Lenalidomide PO + Dexamethasone IV/PO) q21 Days (Consolidation Schema): ?  A cycle is every 21 days: ?    Lenalidomide  ?    Dexamethasone  ?    Bortezomib  ?    Daratumumab and hyaluronidase-fihj  ? ?**Always confirm dose/schedule in your pharmacy ordering system** ? ?REASON: Continuation Of Treatment ?PRIOR TREATMENT: [id number]: DaraVRd - Subcutaneous Daratumumab (Daratumumab/hyaluronidase SUBQ + Bortezomib 1.3 mg/m2 SUBQ D1, 4, 8, 11 + Lenalidomide 25 mg PO + Dexamethasone 20 mg PO/IV) q21 Days Concurrent with Referral to Transplant Service ?TREATMENT RESPONSE: Complete Response (CR) ? ?START ON PATHWAY REGIMEN - Multiple Myeloma and Other Plasma Cell Dyscrasias ? ? ? ? ?Patient Characteristics: ?Multiple Myeloma, Newly Diagnosed, Transplant Eligible, Standard Risk ?Disease Classification: Multiple Myeloma ?R-ISS Staging: I ?Therapeutic Status: Newly Diagnosed ?Is Patient Eligible for Transplant<= Transplant Eligible ?Risk Status: Standard Risk ?Intent of Therapy: ?Curative Intent, Discussed with Patient ?

## 2021-06-09 NOTE — Progress Notes (Signed)
Labs collected at [facility] on 06/08/2021. Calcium 9.2.  ?

## 2021-06-14 ENCOUNTER — Telehealth: Payer: Self-pay | Admitting: Pharmacist

## 2021-06-14 ENCOUNTER — Encounter: Payer: Self-pay | Admitting: Oncology

## 2021-06-14 ENCOUNTER — Other Ambulatory Visit (HOSPITAL_COMMUNITY): Payer: Self-pay

## 2021-06-14 DIAGNOSIS — C9000 Multiple myeloma not having achieved remission: Secondary | ICD-10-CM

## 2021-06-14 MED ORDER — DEXAMETHASONE 4 MG PO TABS: 20.0000 mg | ORAL_TABLET | ORAL | 3 refills | Status: DC

## 2021-06-14 MED ORDER — LENALIDOMIDE 10 MG PO CAPS: 10.0000 mg | ORAL_CAPSULE | Freq: Every day | ORAL | 0 refills | Status: DC

## 2021-06-14 NOTE — Telephone Encounter (Addendum)
Oral Oncology Pharmacy Student Encounter ? ?Received new prescription for lenalidomide (Revlimid) for the treatment of lambda light chain multiple myeloma with 1p del as part of GRIFFIN regimen, planned duration consolidation for 2 cycles (14 on/7 off), followed by maintenance until disease progression or unacceptable toxicity. ? ?CBC and CMP from 05/05/21 assessed, Hgb count back to baseline. No relevant lab abnormality noted, will keep monitoring. Prescription dose and frequency assessed. Revlimid dose reduced to 10 mg based on CrCl 56 mL/min on 05/05/21. ? ?Current medication list in Epic reviewed, no current DDI was identified. ? ?Evaluated chart and no patient barriers to medication adherence identified.  ? ?Prescription has been e-scribed to the [facility] for benefits analysis and approval. ? ?Oral Oncology Clinic will continue to follow for insurance authorization, copayment issues, initial counseling and start date. ? ?Patient agreed to treatment on 06/09/21 per MD documentation. ? ?Adele Dan, PharmD Candidate ?Class of 2023 ?[place]otherapy [facility] ?[phone] ? ?06/14/2021 11:33 AM  ?

## 2021-06-17 ENCOUNTER — Telehealth: Payer: Self-pay | Admitting: *Deleted

## 2021-06-17 NOTE — Telephone Encounter (Signed)
Call from pharmacy stating that patient disputing the dose of her Revlimid stating that it should be 25 mg NOT 10 mg. Asking for clarification ? ?Per office note 06/09/21 ?PLAN:   ?   ?1.  Lambda light chain myeloma with 1p del: Patient underwent autologous stem cell transplant at [facility] on February 24, 2021 with Dr. Alvie Heidelberg.  She was readmitted to the hospital on March 04, 2021 with neutropenic fever, diarrhea, and sepsis-like symptoms.  Infectious disease work-up remains negative.  Her blood counts have essentially resolved.  Patient had consultation yesterday with [facility] transplant team who have recommended consolidation using the GRIFFIN regimen.  Patient will receive consolidation with cycle 5 and 6 using daratumumab on day 1, Velcade on day 1, 4, 8, and 11, Revlimid 25 mg on days 1 through 14 with 7 days off, and weekly dexamethasone on a 21-day cycle.  Patient will then receive maintenance with daratumumab on day 1 and Revlimid 10 mg on days 1 through 21 on a 28-day cycle for cycles 7 through 32.  Proceed with Zometa today.  Patient will return to clinic in approximately 2 weeks to initiate cycle 5 of consolidation treatment. ?

## 2021-06-17 NOTE — Progress Notes (Signed)
?[place]  ?[phone] [phone] ? ?ID: Hannah Mcdonald OB: Nov 07, 1955  MR#: [id number]  DJM#:[id number] ? ?Patient Care Team: ?Albina Billet, MD as PCP - General (Internal Medicine) ?Robert Bellow, MD as Consulting Physician (General Surgery) ? ? 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 ? ?INTERVAL HISTORY: Patient returns to clinic today for further evaluation and consideration of cycle 5, day 1 of daratumumab, Velcade, and Revlimid.  She continues to have a mild peripheral neuropathy, but otherwise feels well.  She has no other neurologic complaints.  She denies any recent fevers or illnesses.  She has no chest pain, shortness of breath, cough, or hemoptysis.  She denies any nausea, vomiting, constipation.  Her diarrhea has resolved.  She has no urinary complaints.  Patient offers no further specific complaints today. ? ?REVIEW OF SYSTEMS:   ?Review of Systems  ?Constitutional: Negative.  Negative for fever, malaise/fatigue and weight loss.  ?Respiratory: Negative.  Negative for cough, hemoptysis and shortness of breath.   ?Cardiovascular:  Negative for chest pain and leg swelling.  ?Gastrointestinal: Negative.  Negative for abdominal pain, diarrhea and nausea.  ?Genitourinary: Negative.  Negative for dysuria.  ?Musculoskeletal: Negative.  Negative for back pain and joint pain.  ?Skin: Negative.  Negative for rash.  ?Neurological:  Positive for sensory change. Negative for dizziness, tremors, focal weakness, weakness and headaches.  ?Psychiatric/Behavioral: Negative.  The patient is not nervous/anxious.   ? ?As per HPI. Otherwise, a complete review of systems is negative. ? ?PAST MEDICAL HISTORY: ?Past Medical History:  ?Diagnosis Date  ? Anxiety   ? Diabetes mellitus without complication (Uncertain) 2229  ? GERD (gastroesophageal reflux disease)   ? Hyperlipidemia   ? Myeloma ([place])   ? Personal history of colonic polyps   ? Squamous cell  carcinoma of skin 11/25/2013  ? Left chest. WD SCC with superficial infiltration.  ? Tachycardia   ? ? ?PAST SURGICAL HISTORY: ?Past Surgical History:  ?Procedure Laterality Date  ? AUGMENTATION MAMMAPLASTY Bilateral 1983  ? silicone and replacement in 2001  ? [place]  ? [place]  ? COLONOSCOPY  1988, [id number], 2008, 2013  ? COLONOSCOPY WITH PROPOFOL N/A 08/16/2016  ? Procedure: COLONOSCOPY WITH PROPOFOL;  Surgeon: Robert Bellow, MD;  Location: [facility] ENDOSCOPY;  Service: Endoscopy;  Laterality: N/A;  ? DILATION AND CURETTAGE OF UTERUS    ? ENDOMETRIAL ABLATION  12/1991  ? ganglion cyst removal     ? IR IMAGING GUIDED PORT INSERTION  10/27/2020  ? IR IMAGING GUIDED PORT INSERTION  04/04/2021  ? KYPHOPLASTY N/A 10/28/2020  ? Procedure: T12 and L3 KYPHOPLASTY;  Surgeon: Hessie Knows, MD;  Location: [facility] ORS;  Service: Orthopedics;  Laterality: N/A;  ? TONSILLECTOMY    ? WRIST SURGERY Right 05/1995  ? ? ?FAMILY HISTORY: ?Family History  ?Problem Relation Age of Onset  ? Colon polyps Sister   ? Colon cancer Father 20  ? Diabetes Mother   ? Breast cancer Neg Hx   ? ? ?ADVANCED DIRECTIVES (Y/N):  N ? ?HEALTH MAINTENANCE: ?Social History  ? ?Tobacco Use  ? Smoking status: Former  ?  Packs/day: 1.00  ?  Years: 4.00  ?  Pack years: 4.00  ?  Types: Cigarettes  ?  Quit date: 56  ?  Years since quitting: 41.3  ? Smokeless tobacco: Never  ?Vaping Use  ? Vaping Use: Never used  ?Substance Use Topics  ?  Alcohol use: Yes  ?  Alcohol/week: 1.0 - 2.0 standard drink  ?  Types: 1 - 2 Glasses of wine per week  ?  Comment: occassionally  ? Drug use: No  ? ? ? Colonoscopy: ? PAP: ? Bone density: ? Lipid panel: ? ?No Known Allergies ? ?Current Outpatient Medications  ?Medication Sig Dispense Refill  ? acyclovir (ZOVIRAX) 400 MG tablet TAKE 1 TABLET BY MOUTH TWICE DAILY 60 tablet 5  ? ASPIRIN 81 PO Take 81 mg by mouth daily.    ? atorvastatin (LIPITOR) 20 MG tablet Take 20 mg by mouth daily.     ? baclofen (LIORESAL) 10 MG tablet Take 5 mg by mouth 3 (three) times daily.    ? busPIRone (BUSPAR) 30 MG tablet Take 30 mg by mouth 2 (two) times daily.    ? cyclobenzaprine (FLEXERIL) 10 MG tablet TAKE 1 TABLET BY MOUTH AT BEDTIME 30 tablet 2  ? dexamethasone (DECADRON) 4 MG tablet Take 5 tablets (20 mg total) by mouth once a week. 20 tablet 3  ? gabapentin (NEURONTIN) 300 MG capsule Take 300 mg by mouth at bedtime.    ? lenalidomide (REVLIMID) 10 MG capsule Take 1 capsule (10 mg total) by mouth daily. Take for 14 days, then hold for 7 days. Repeat every 21 days. 14 capsule 0  ? lidocaine-prilocaine (EMLA) cream APPLY 1 APPLICATION TOPICALLY AS NEEDED 30 g 2  ? liraglutide (VICTOZA) 18 MG/3ML SOPN Inject 1.2 mg into the skin daily.    ? Magnesium 400 MG TABS Take 1 tablet by mouth daily.    ? meloxicam (MOBIC) 15 MG tablet Take 15 mg by mouth daily.    ? methocarbamol (ROBAXIN) 500 MG tablet Take 1 tablet (500 mg total) by mouth every 6 (six) hours as needed for muscle spasms. 40 tablet 1  ? metoprolol succinate (TOPROL-XL) 50 MG 24 hr tablet Take 50 mg by mouth. Take 1.5 tabs in am. Take 1 tablet at HS.    ? morphine (MS CONTIN) 15 MG 12 hr tablet TAKE 2 TABLETS BY MOUTH EVERY 12 HOURS 120 tablet 0  ? Multiple Vitamin (MULTI-VITAMIN) tablet Take 1 tablet by mouth daily.    ? omeprazole (PRILOSEC) 40 MG capsule Take 40 mg by mouth daily.    ? ondansetron (ZOFRAN) 8 MG tablet Take 1 tablet (8 mg total) by mouth 2 (two) times daily as needed (Nausea or vomiting). 60 tablet 2  ? oxyCODONE-acetaminophen (PERCOCET/ROXICET) 5-325 MG tablet TAKE 1-2 TABLETS BY MOUTH EVERY 4 HOURS AS NEEDED FOR SEVERE PAIN 90 tablet 0  ? prochlorperazine (COMPAZINE) 10 MG tablet Take 1 tablet (10 mg total) by mouth every 6 (six) hours as needed (Nausea or vomiting). 60 tablet 2  ? traMADol (ULTRAM) 50 MG tablet TAKE 1 TABLET BY MOUTH TWICE DAILY AS NEEDED FOR MODERATE PAIN 60 tablet 1  ? ?No current facility-administered medications for  this visit.  ? ?Facility-Administered Medications Ordered in Other Visits  ?Medication Dose Route Frequency Provider Last Rate Last Admin  ? 0.9 %  sodium chloride infusion   Intravenous Continuous Lloyd Huger, MD   Stopped at 04/07/21 1142  ? sodium chloride flush (NS) 0.9 % injection 10 mL  10 mL Intravenous PRN Lloyd Huger, MD   10 mL at 11/23/20 0932  ? ? ?OBJECTIVE: ?Vitals:  ? 06/21/21 0842  ?BP: 110/65  ?Pulse: 93  ?Resp: 16  ?Temp: 98.1 ?F (36.7 ?C)  ?SpO2: 100%  ?   Body mass  index is 20.85 kg/m?Marland Kitchen    ECOG FS:0 - Asymptomatic ? ?General: Well-developed, well-nourished, no acute distress. ?Eyes: Pink conjunctiva, anicteric sclera. ?HEENT: Normocephalic, moist mucous membranes. ?Lungs: No audible wheezing or coughing. ?Heart: Regular rate and rhythm. ?Abdomen: Soft, nontender, no obvious distention. ?Musculoskeletal: No edema, cyanosis, or clubbing. ?Neuro: Alert, answering all questions appropriately. Cranial nerves grossly intact. ?Skin: No rashes or petechiae noted. ?Psych: Normal affect. ? ?LAB RESULTS: ? ?Lab Results  ?Component Value Date  ? NA 139 06/21/2021  ? K 4.1 06/21/2021  ? CL 105 06/21/2021  ? CO2 26 06/21/2021  ? GLUCOSE 152 (H) 06/21/2021  ? BUN 17 06/21/2021  ? CREATININE 0.94 06/21/2021  ? CALCIUM 9.5 06/21/2021  ? PROT 6.7 06/21/2021  ? ALBUMIN 4.1 06/21/2021  ? AST 35 06/21/2021  ? ALT 31 06/21/2021  ? ALKPHOS 119 06/21/2021  ? BILITOT 0.2 (L) 06/21/2021  ? GFRNONAA >60 06/21/2021  ? ? ?Lab Results  ?Component Value Date  ? WBC 4.9 06/21/2021  ? NEUTROABS 3.6 06/21/2021  ? HGB 12.0 06/21/2021  ? HCT 37.0 06/21/2021  ? MCV 101.4 (H) 06/21/2021  ? PLT 174 06/21/2021  ? ? ? ?STUDIES: ?MR Thoracic Spine W Wo Contrast ? ?Result Date: 05/26/2021 ?CLINICAL DATA:  Pain, history of myeloma EXAM: MRI THORACIC WITHOUT AND WITH CONTRAST TECHNIQUE: Multiplanar and multiecho pulse sequences of the thoracic spine were obtained without and with intravenous contrast. CONTRAST:  92m GADAVIST  GADOBUTROL 1 MMOL/ML IV SOLN COMPARISON:  Thoracic spine MRI 10/19/2020 CT chest 01/24/2021 FINDINGS: Alignment: There is no antero or retrolisthesis. There is minimal levoscoliosis. Vertebrae: Previous

## 2021-06-17 NOTE — Telephone Encounter (Signed)
Oral Chemotherapy Pharmacist Encounter  ? ?Lenalidomide '10mg'$  is the intended dose for for Ms. Marian. Her dose was adjusted for her renal function. This dose matches the pre transplant dose of lenalidomide and the recommended dose  from th [facility]plant team.  ? ?Spoke with both patient and pharmacy to clarify.  ? ? ?Darl Pikes, PharmD, BCPS, BCOP, CPP ?Hematology/Oncology Clinical Pharmacist ?[place]/[facility] Chemotherapy [facility] ?[phone] ? ?06/17/2021 11:20 AM ? ?

## 2021-06-21 ENCOUNTER — Ambulatory Visit: Payer: Medicare PPO

## 2021-06-21 ENCOUNTER — Other Ambulatory Visit: Payer: Medicare PPO

## 2021-06-21 ENCOUNTER — Inpatient Hospital Stay: Payer: Medicare PPO

## 2021-06-21 ENCOUNTER — Inpatient Hospital Stay: Payer: Medicare PPO | Admitting: Oncology

## 2021-06-21 ENCOUNTER — Inpatient Hospital Stay: Payer: Medicare PPO | Admitting: Pharmacist

## 2021-06-21 ENCOUNTER — Ambulatory Visit: Payer: Medicare PPO | Admitting: Oncology

## 2021-06-21 ENCOUNTER — Encounter: Payer: Self-pay | Admitting: Oncology

## 2021-06-21 VITALS — BP 110/65 | HR 93 | Temp 98.1°F | Resp 16 | Ht 62.0 in | Wt 114.0 lb

## 2021-06-21 DIAGNOSIS — Z95828 Presence of other vascular implants and grafts: Secondary | ICD-10-CM

## 2021-06-21 DIAGNOSIS — C9000 Multiple myeloma not having achieved remission: Secondary | ICD-10-CM | POA: Diagnosis not present

## 2021-06-21 DIAGNOSIS — Z5112 Encounter for antineoplastic immunotherapy: Secondary | ICD-10-CM | POA: Diagnosis not present

## 2021-06-21 LAB — COMPREHENSIVE METABOLIC PANEL
ALT: 31 U/L (ref 0–44)
AST: 35 U/L (ref 15–41)
Albumin: 4.1 g/dL (ref 3.5–5.0)
Alkaline Phosphatase: 119 U/L (ref 38–126)
Anion gap: 8 (ref 5–15)
BUN: 17 mg/dL (ref 8–23)
CO2: 26 mmol/L (ref 22–32)
Calcium: 9.5 mg/dL (ref 8.9–10.3)
Chloride: 105 mmol/L (ref 98–111)
Creatinine, Ser: 0.94 mg/dL (ref 0.44–1.00)
GFR, Estimated: >60 mL/min (ref >60)
Glucose, Bld: 152 mg/dL — ABNORMAL HIGH (ref 70–99)
Potassium: 4.1 mmol/L (ref 3.5–5.1)
Sodium: 139 mmol/L (ref 135–145)
Total Bilirubin: 0.2 mg/dL — ABNORMAL LOW (ref 0.3–1.2)
Total Protein: 6.7 g/dL (ref 6.5–8.1)

## 2021-06-21 LAB — CBC WITH DIFFERENTIAL/PLATELET
Abs Immature Granulocytes: 0.01 10*3/uL (ref 0.00–0.07)
Basophils Absolute: 0 10*3/uL (ref 0.0–0.1)
Basophils Relative: 1 %
Eosinophils Absolute: 0.2 10*3/uL (ref 0.0–0.5)
Eosinophils Relative: 3 %
HCT: 37 % (ref 36.0–46.0)
Hemoglobin: 12 g/dL (ref 12.0–15.0)
Immature Granulocytes: 0 %
Lymphocytes Relative: 15 %
Lymphs Abs: 0.7 10*3/uL (ref 0.7–4.0)
MCH: 32.9 pg (ref 26.0–34.0)
MCHC: 32.4 g/dL (ref 30.0–36.0)
MCV: 101.4 fL — ABNORMAL HIGH (ref 80.0–100.0)
Monocytes Absolute: 0.4 10*3/uL (ref 0.1–1.0)
Monocytes Relative: 8 %
Neutro Abs: 3.6 10*3/uL (ref 1.7–7.7)
Neutrophils Relative %: 73 %
Platelets: 174 10*3/uL (ref 150–400)
RBC: 3.65 MIL/uL — ABNORMAL LOW (ref 3.87–5.11)
RDW: 12.2 % (ref 11.5–15.5)
WBC: 4.9 10*3/uL (ref 4.0–10.5)
nRBC: 0 % (ref 0.0–0.2)

## 2021-06-21 MED ORDER — DIPHENHYDRAMINE HCL 25 MG PO CAPS
25.0000 mg | ORAL_CAPSULE | Freq: Once | ORAL | Status: AC
  Administered 2021-06-21: 25 mg via ORAL
  Filled 2021-06-21: qty 1

## 2021-06-21 MED ORDER — ACETAMINOPHEN 325 MG PO TABS
650.0000 mg | ORAL_TABLET | Freq: Once | ORAL | Status: AC
  Administered 2021-06-21: 650 mg via ORAL
  Filled 2021-06-21: qty 2

## 2021-06-21 MED ORDER — DARATUMUMAB-HYALURONIDASE-FIHJ 1800-30000 MG-UT/15ML ~~LOC~~ SOLN
1800.0000 mg | Freq: Once | SUBCUTANEOUS | Status: AC
  Administered 2021-06-21: 1800 mg via SUBCUTANEOUS
  Filled 2021-06-21: qty 15

## 2021-06-21 MED ORDER — BORTEZOMIB CHEMO SQ INJECTION 3.5 MG (2.5MG/ML): 1.3000 mg/m2 | Freq: Once | INTRAMUSCULAR | Status: DC

## 2021-06-21 MED ORDER — HEPARIN SOD (PORK) LOCK FLUSH 100 UNIT/ML IV SOLN
500.0000 [IU] | Freq: Once | INTRAVENOUS | Status: AC
  Administered 2021-06-21: 500 [IU] via INTRAVENOUS
  Filled 2021-06-21: qty 5

## 2021-06-21 MED ORDER — BORTEZOMIB CHEMO SQ INJECTION 3.5 MG (2.5MG/ML)
1.3000 mg/m2 | Freq: Once | INTRAMUSCULAR | Status: AC
  Administered 2021-06-21: 2 mg via SUBCUTANEOUS
  Filled 2021-06-21: qty 0.8

## 2021-06-21 NOTE — Progress Notes (Deleted)
PT states she took tylenol and benadryl at home. ? ?

## 2021-06-21 NOTE — Progress Notes (Signed)
Pt states she took Decadron at home this am ? ?

## 2021-06-21 NOTE — Progress Notes (Signed)
? ?Oral Chemotherapy Clinic ?[place]  ?[phone]) [phone] [phone] ? ?Patient Care Team: ?Albina Billet, MD as PCP - General (Internal Medicine) ?Robert Bellow, MD as Consulting Physician (General Surgery)  ? ?Name of the patient: Hannah Mcdonald  ?[id number]  ?1956-01-03  ? ?Date of visit: 06/21/21 ? ?HPI: Patient is a 66 y.o. female with lambda light chain multiple myeloma with 1p del on GRIFFIN regimen for post transplant consolidation initiation (lenalidomide, daratumumab, bortezomib, dexamethasone).  ? ?Reason for Consult: lenalidomide (Revlimid) oral chemotherapy education. ? ? ?PAST MEDICAL HISTORY: ?Past Medical History:  ?Diagnosis Date  ? Anxiety   ? Diabetes mellitus without complication ([place]) 0109  ? GERD (gastroesophageal reflux disease)   ? Hyperlipidemia   ? Myeloma ([place])   ? Personal history of colonic polyps   ? Squamous cell carcinoma of skin 11/25/2013  ? Left chest. WD SCC with superficial infiltration.  ? Tachycardia   ? ? ?HEMATOLOGY/ONCOLOGY HISTORY:  ?Oncology History  ?Lambda light chain myeloma ([place])  ?08/24/2020 Initial Diagnosis  ? Lambda light chain myeloma ([place]) ? ?  ?09/16/2020 Cancer Staging  ? Staging form: Plasma Cell Myeloma and Plasma Cell Disorders, AJCC 8th Edition ?- Clinical stage from 09/16/2020: RISS Stage I (Beta-2-microglobulin (mg/L): 2.5, Albumin (g/dL): 4.9, ISS: Stage I, High-risk cytogenetics: Absent, LDH: Normal) - Signed by Lloyd Huger, MD on 09/16/2020 ?Beta 2 microglobulin range (mg/L): Less than 3.5 ?Albumin range (g/dL): Greater than or equal to 3.5 ?Cytogenetics: 1p deletion ? ?  ?11/02/2020 -  Chemotherapy  ? Patient is on Treatment Plan : POST TRANSPLANT DaraVRd (Daratumumab SQ) q21d x 2 Cycles (Consolidation)- cycle 5&6  ? ?  ?  ?08/09/2021 -  Chemotherapy  ? Patient is on Treatment Plan : MYELOMA Daratumumab q28d- Maintainance treatment w/ revlimid  ? ?  ?  ? ? ?ALLERGIES:  has No Known Allergies. ? ?MEDICATIONS:   ?Current Outpatient Medications  ?Medication Sig Dispense Refill  ? acyclovir (ZOVIRAX) 400 MG tablet TAKE 1 TABLET BY MOUTH TWICE DAILY 60 tablet 5  ? ASPIRIN 81 PO Take 81 mg by mouth daily.    ? atorvastatin (LIPITOR) 20 MG tablet Take 20 mg by mouth daily.    ? baclofen (LIORESAL) 10 MG tablet Take 5 mg by mouth 3 (three) times daily.    ? busPIRone (BUSPAR) 30 MG tablet Take 30 mg by mouth 2 (two) times daily.    ? cyclobenzaprine (FLEXERIL) 10 MG tablet TAKE 1 TABLET BY MOUTH AT BEDTIME 30 tablet 2  ? dexamethasone (DECADRON) 4 MG tablet Take 5 tablets (20 mg total) by mouth once a week. 20 tablet 3  ? gabapentin (NEURONTIN) 300 MG capsule Take 300 mg by mouth at bedtime.    ? lenalidomide (REVLIMID) 10 MG capsule Take 1 capsule (10 mg total) by mouth daily. Take for 14 days, then hold for 7 days. Repeat every 21 days. 14 capsule 0  ? lidocaine-prilocaine (EMLA) cream APPLY 1 APPLICATION TOPICALLY AS NEEDED 30 g 2  ? liraglutide (VICTOZA) 18 MG/3ML SOPN Inject 1.2 mg into the skin daily.    ? Magnesium 400 MG TABS Take 1 tablet by mouth daily.    ? meloxicam (MOBIC) 15 MG tablet Take 15 mg by mouth daily.    ? methocarbamol (ROBAXIN) 500 MG tablet Take 1 tablet (500 mg total) by mouth every 6 (six) hours as needed for muscle spasms. 40 tablet 1  ? metoprolol succinate (TOPROL-XL) 50 MG 24 hr tablet Take 50 mg  by mouth. Take 1.5 tabs in am. Take 1 tablet at HS.    ? morphine (MS CONTIN) 15 MG 12 hr tablet TAKE 2 TABLETS BY MOUTH EVERY 12 HOURS 120 tablet 0  ? Multiple Vitamin (MULTI-VITAMIN) tablet Take 1 tablet by mouth daily.    ? omeprazole (PRILOSEC) 40 MG capsule Take 40 mg by mouth daily.    ? ondansetron (ZOFRAN) 8 MG tablet Take 1 tablet (8 mg total) by mouth 2 (two) times daily as needed (Nausea or vomiting). 60 tablet 2  ? oxyCODONE-acetaminophen (PERCOCET/ROXICET) 5-325 MG tablet TAKE 1-2 TABLETS BY MOUTH EVERY 4 HOURS AS NEEDED FOR SEVERE PAIN 90 tablet 0  ? prochlorperazine (COMPAZINE) 10 MG  tablet Take 1 tablet (10 mg total) by mouth every 6 (six) hours as needed (Nausea or vomiting). 60 tablet 2  ? traMADol (ULTRAM) 50 MG tablet TAKE 1 TABLET BY MOUTH TWICE DAILY AS NEEDED FOR MODERATE PAIN 60 tablet 1  ? ?No current facility-administered medications for this visit.  ? ?Facility-Administered Medications Ordered in Other Visits  ?Medication Dose Route Frequency Provider Last Rate Last Admin  ? 0.9 %  sodium chloride infusion   Intravenous Continuous Lloyd Huger, MD   Stopped at 04/07/21 1142  ? sodium chloride flush (NS) 0.9 % injection 10 mL  10 mL Intravenous PRN Lloyd Huger, MD   10 mL at 11/23/20 0932  ? ? ?VITAL SIGNS: ?There were no vitals taken for this visit. ?There were no vitals filed for this visit.  ?Estimated body mass index is 20.85 kg/m? as calculated from the following: ?  Height as of an earlier encounter on 06/21/21: 5' 2" (1.575 m). ?  Weight as of an earlier encounter on 06/21/21: 51.7 kg (114 lb). ? ?LABS: ?CBC: ?   ?Component Value Date/Time  ? WBC 4.9 06/21/2021 0829  ? HGB 12.0 06/21/2021 0829  ? HCT 37.0 06/21/2021 0829  ? PLT 174 06/21/2021 0829  ? MCV 101.4 (H) 06/21/2021 0829  ? NEUTROABS 3.6 06/21/2021 0829  ? LYMPHSABS 0.7 06/21/2021 0829  ? MONOABS 0.4 06/21/2021 0829  ? EOSABS 0.2 06/21/2021 0829  ? BASOSABS 0.0 06/21/2021 0829  ? ?Comprehensive Metabolic Panel: ?   ?Component Value Date/Time  ? NA 139 06/21/2021 0829  ? K 4.1 06/21/2021 0829  ? CL 105 06/21/2021 0829  ? CO2 26 06/21/2021 0829  ? BUN 17 06/21/2021 0829  ? CREATININE 0.94 06/21/2021 0829  ? GLUCOSE 152 (H) 06/21/2021 0829  ? CALCIUM 9.5 06/21/2021 0829  ? AST 35 06/21/2021 0829  ? ALT 31 06/21/2021 0829  ? ALKPHOS 119 06/21/2021 0829  ? BILITOT 0.2 (L) 06/21/2021 0829  ? PROT 6.7 06/21/2021 0829  ? ALBUMIN 4.1 06/21/2021 0829  ? ? ? ?Present during today's visit: patient and patient's husband ? ?Start plan: lenalidomide 10 mg during consolidation for 2 cycles (14 on/7 off) as part of GRIFFIN  starting today 06/21/21 ?  ?Patient Education ?I spoke with patient for re-education of oral chemotherapy medication: lenalidomide (Revlimid)  ? ?Administration: ?Counseled patient on administration, dosing, side effects, monitoring, drug-food interactions, safe handling, storage, and disposal. ?Patient will take 1 capsule (10 mg total) by mouth daily. Take for 14 days, then hold for 7 days. Repeat every 21 days. (x2 cycles) ? ?**Patient reminded to restart her aspirin 64m ? ?Side Effects: ?Side effects include but not limited to: diarrhea, constipation, nausea/vomiting, fatigue, skin rash, flu-like symptoms. ?Diarrhea: patient has baseline loose stools. Confirmed that patient has loperamide  on hand. Educated patient to contact office if diarrhea cannot be controlled with loperamide. ?Nausea/vomiting: patient has baseline nausea. Confirmed that patient has prochlorperazine and ondansetron on hand. Patient knows how to manage her nausea and vomiting episodes with meds. ?Denied other symptoms with previous lenalidomide use.   ? ?Drug-drug Interactions (DDI): ?No relevant DDI noted. ? ?Adherence: ?After discussion with patient no patient barriers to medication adherence identified.  ?Reviewed with patient importance of keeping a medication schedule and plan for any missed doses. ? ?Ms. Nordhoff voiced understanding and appreciation. All questions answered. Medication handout provided. ? ?Provided patient with [facility] phone number. Patient knows to call the office with questions or concerns. Oral Chemotherapy Navigation Clinic will continue to follow. ? ?Patient expressed understanding and was in agreement with this plan. She also understands that She can call clinic at any time with any questions, concerns, or complaints.  ? ?Medication Access Issues: ?No ? ?Follow-up plan: RTC later this week ? ?Thank you for allowing me to participate in the care of this patient.  ? ?Time Total: 15  min ? ?Visit consisted of counseling and education on dealing with issues of symptom management in the setting of serious and potentially life-threatening illness.Greater than 50%  of this time was spent counseling and co

## 2021-06-21 NOTE — Patient Instructions (Signed)
[facility] CANCER CTR AT [place]  Discharge Instructions: ?Thank you for choosing [place] to provide your oncology and hematology care.  ?If you have a lab appointment with the [facility], please go directly to the [facility] and check in at the registration area. ? ?Wear comfortable clothing and clothing appropriate for easy access to any Portacath or PICC line.  ? ?We strive to give you quality time with your provider. You may need to reschedule your appointment if you arrive late (15 or more minutes).  Arriving late affects you and other patients whose appointments are after yours.  Also, if you miss three or more appointments without notifying the office, you may be dismissed from the clinic at the provider?s discretion.    ?  ?For prescription refill requests, have your pharmacy contact our office and allow 72 hours for refills to be completed.   ? ?Today you received the following chemotherapy and/or immunotherapy agents DARZALEX and VELCADE    ?  ?To help prevent nausea and vomiting after your treatment, we encourage you to take your nausea medication as directed. ? ?BELOW ARE SYMPTOMS THAT SHOULD BE REPORTED IMMEDIATELY: ?*FEVER GREATER THAN 100.4 F (38 ?C) OR HIGHER ?*CHILLS OR SWEATING ?*NAUSEA AND VOMITING THAT IS NOT CONTROLLED WITH YOUR NAUSEA MEDICATION ?*UNUSUAL SHORTNESS OF BREATH ?*UNUSUAL BRUISING OR BLEEDING ?*URINARY PROBLEMS (pain or burning when urinating, or frequent urination) ?*BOWEL PROBLEMS (unusual diarrhea, constipation, pain near the anus) ?TENDERNESS IN MOUTH AND THROAT WITH OR WITHOUT PRESENCE OF ULCERS (sore throat, sores in mouth, or a toothache) ?UNUSUAL RASH, SWELLING OR PAIN  ?UNUSUAL VAGINAL DISCHARGE OR ITCHING  ? ?Items with * indicate a potential emergency and should be followed up as soon as possible or go to the Emergency Department if any problems should occur. ? ?Please show the CHEMOTHERAPY ALERT CARD or IMMUNOTHERAPY ALERT CARD at  check-in to the Emergency Department and triage nurse. ? ?Should you have questions after your visit or need to cancel or reschedule your appointment, please contact [facility] CANCER [place] [address] AT [place]  [phone] and follow the prompts.  Office hours are 8:00 a.m. to 4:30 p.m. Monday - Friday. Please note that voicemails left after 4:00 p.m. may not be returned until the following business day.  We are closed weekends and major holidays. You have access to a nurse at all times for urgent questions. Please call the main number to the clinic [phone] and follow the prompts. ? ?For any non-urgent questions, you may also contact your provider using MyChart. We now offer e-Visits for anyone 86 and older to request care online for non-urgent symptoms. For details visit [email or web address]. ?  ?Also download the MyChart app! Go to the app store, search "MyChart", open the app, select [place], and log in with your MyChart username and password. ? ?Due to Covid, a mask is required upon entering the hospital/clinic. If you do not have a mask, one will be given to you upon arrival. For doctor visits, patients may have 1 support person aged 76 or older with them. For treatment visits, patients cannot have anyone with them due to current Covid guidelines and our immunocompromised population.  ? ?Daratumumab injection ?What is this medication? ?DARATUMUMAB (dar a toom ue mab) is a monoclonal antibody. It is used to treat multiple myeloma. ?This medicine may be used for other purposes; ask your health care provider or pharmacist if you have questions. ?COMMON BRAND NAME(S): DARZALEX ?What should I tell my care team  before I take this medication? ?They need to know if you have any of these conditions: ?hereditary fructose intolerance ?infection (especially a virus infection such as chickenpox, herpes, or hepatitis B virus) ?lung or breathing disease (asthma, COPD) ?an unusual or allergic reaction to  daratumumab, sorbitol, other medicines, foods, dyes, or preservatives ?pregnant or trying to get pregnant ?breast-feeding ?How should I use this medication? ?This medicine is for infusion into a vein. It is given by a health care professional in a hospital or clinic setting. ?Talk to your pediatrician regarding the use of this medicine in children. Special care may be needed. ?Overdosage: If you think you have taken too much of this medicine contact a poison control center or emergency room at once. ?NOTE: This medicine is only for you. Do not share this medicine with others. ?What if I miss a dose? ?Keep appointments for follow-up doses as directed. It is important not to miss your dose. Call your doctor or health care professional if you are unable to keep an appointment. ?What may interact with this medication? ?Interactions have not been studied. ?This list may not describe all possible interactions. Give your health care provider a list of all the medicines, herbs, non-prescription drugs, or dietary supplements you use. Also tell them if you smoke, drink alcohol, or use illegal drugs. Some items may interact with your medicine. ?What should I watch for while using this medication? ?Your condition will be monitored carefully while you are receiving this medicine. ?This medicine can cause serious allergic reactions. To reduce your risk, your health care provider may give you other medicine to take before receiving this one. Be sure to follow the directions from your health care provider. ?This medicine can affect the results of blood tests to match your blood type. These changes can last for up to 6 months after the final dose. Your healthcare provider will do blood tests to match your blood type before you start treatment. Tell all of your healthcare providers that you are being treated with this medicine before receiving a blood transfusion. ?This medicine can affect the results of some tests used to determine  treatment response; extra tests may be needed to evaluate response. ?Do not become pregnant while taking this medicine or for 3 months after stopping it. Women should inform their health care provider if they wish to become pregnant or think they might be pregnant. There is a potential for serious side effects to an unborn child. Talk to your health care provider for more information. Do not breast-feed an infant while taking this medicine. ?What side effects may I notice from receiving this medication? ?Side effects that you should report to your care team as soon as possible: ?Allergic reactions--skin rash, itching or hives, swelling of the face, lips, or tongue ?Blurred vision ?Infection--fever, chills, cough, sore throat, pain or trouble passing urine ?Infusion reactions--dizziness, fast heartbeat, feeling faint or lightheaded, falls, headache, increase in blood pressure, nausea, vomiting, or wheezing or trouble breathing with loud or whistling sounds ?Unusual bleeding or bruising ?Side effects that usually do not require medical attention (report to your care team if they continue or are bothersome): ?Constipation ?Diarrhea ?Pain, tingling, numbness in the hands or feet ?Swelling of the ankles, feet, hands ?Tiredness ?This list may not describe all possible side effects. Call your doctor for medical advice about side effects. You may report side effects to FDA at 1-800-FDA-1088. ?Where should I keep my medication? ?This drug is given in a hospital or clinic and  will not be stored at home. ?NOTE: This sheet is a summary. It may not cover all possible information. If you have questions about this medicine, talk to your doctor, pharmacist, or health care provider. ?? 2023 Elsevier/Gold Standard (2020-07-22 00:00:00) ? ?Bortezomib injection ?What is this medication? ?BORTEZOMIB (bor TEZ oh mib) targets proteins in cancer cells and stops the cancer cells from growing. It treats multiple myeloma and mantle cell  lymphoma. ?This medicine may be used for other purposes; ask your health care provider or pharmacist if you have questions. ?COMMON BRAND NAME(S): Velcade ?What should I tell my care team before I take this medicati

## 2021-06-22 LAB — SAMPLE TO BLOOD BANK

## 2021-06-23 ENCOUNTER — Encounter: Payer: Self-pay | Admitting: Oncology

## 2021-06-24 ENCOUNTER — Inpatient Hospital Stay: Payer: Medicare PPO

## 2021-06-24 ENCOUNTER — Other Ambulatory Visit: Payer: Self-pay | Admitting: Oncology

## 2021-06-24 VITALS — BP 109/55 | HR 91 | Temp 98.0°F | Resp 17

## 2021-06-24 DIAGNOSIS — C9000 Multiple myeloma not having achieved remission: Secondary | ICD-10-CM

## 2021-06-24 DIAGNOSIS — Z5112 Encounter for antineoplastic immunotherapy: Secondary | ICD-10-CM | POA: Diagnosis not present

## 2021-06-24 MED ORDER — BORTEZOMIB CHEMO SQ INJECTION 3.5 MG (2.5MG/ML)
1.3000 mg/m2 | Freq: Once | INTRAMUSCULAR | Status: AC
  Administered 2021-06-24: 2 mg via SUBCUTANEOUS
  Filled 2021-06-24: qty 0.8

## 2021-06-24 MED ORDER — PROCHLORPERAZINE MALEATE 10 MG PO TABS: 10.0000 mg | ORAL_TABLET | Freq: Once | ORAL | Status: DC

## 2021-06-24 NOTE — Patient Instructions (Signed)
[facility] CANCER CTR AT [place]  Discharge Instructions: ?Thank you for choosing [facility] to provide your oncology and hematology care.  ?If you have a lab appointment with the Cancer Center, please go directly to the Cancer Center and check in at the registration area. ? ?Wear comfortable clothing and clothing appropriate for easy access to any Portacath or PICC line.  ? ?We strive to give you quality time with your provider. You may need to reschedule your appointment if you arrive late (15 or more minutes).  Arriving late affects you and other patients whose appointments are after yours.  Also, if you miss three or more appointments without notifying the office, you may be dismissed from the clinic at the provider?s discretion.    ?  ?For prescription refill requests, have your pharmacy contact our office and allow 72 hours for refills to be completed.   ? ?Today you received the following chemotherapy and/or immunotherapy agents Velcade    ?  ?To help prevent nausea and vomiting after your treatment, we encourage you to take your nausea medication as directed. ? ?BELOW ARE SYMPTOMS THAT SHOULD BE REPORTED IMMEDIATELY: ?*FEVER GREATER THAN 100.4 F (38 ?C) OR HIGHER ?*CHILLS OR SWEATING ?*NAUSEA AND VOMITING THAT IS NOT CONTROLLED WITH YOUR NAUSEA MEDICATION ?*UNUSUAL SHORTNESS OF BREATH ?*UNUSUAL BRUISING OR BLEEDING ?*URINARY PROBLEMS (pain or burning when urinating, or frequent urination) ?*BOWEL PROBLEMS (unusual diarrhea, constipation, pain near the anus) ?TENDERNESS IN MOUTH AND THROAT WITH OR WITHOUT PRESENCE OF ULCERS (sore throat, sores in mouth, or a toothache) ?UNUSUAL RASH, SWELLING OR PAIN  ?UNUSUAL VAGINAL DISCHARGE OR ITCHING  ? ?Items with * indicate a potential emergency and should be followed up as soon as possible or go to the Emergency Department if any problems should occur. ? ?Please show the CHEMOTHERAPY ALERT CARD or IMMUNOTHERAPY ALERT CARD at check-in to the  Emergency Department and triage nurse. ? ?Should you have questions after your visit or need to cancel or reschedule your appointment, please contact [facility] CANCER [facility] AT [place]  [phone] and follow the prompts.  Office hours are 8:00 a.m. to 4:30 p.m. Monday - Friday. Please note that voicemails left after 4:00 p.m. may not be returned until the following business day.  We are closed weekends and major holidays. You have access to a nurse at all times for urgent questions. Please call the main number to the clinic [phone] and follow the prompts. ? ?For any non-urgent questions, you may also contact your provider using MyChart. We now offer e-Visits for anyone 18 and older to request care online for non-urgent symptoms. For details visit [email or web address]. ?  ?Also download the MyChart app! Go to the app store, search "MyChart", open the app, select [place], and log in with your MyChart username and password. ? ?Due to Covid, a mask is required upon entering the hospital/clinic. If you do not have a mask, one will be given to you upon arrival. For doctor visits, patients may have 1 support person aged 18 or older with them. For treatment visits, patients cannot have anyone with them due to current Covid guidelines and our immunocompromised population.  ?

## 2021-06-25 ENCOUNTER — Other Ambulatory Visit: Payer: Self-pay | Admitting: Oncology

## 2021-06-28 ENCOUNTER — Ambulatory Visit: Payer: Medicare PPO | Admitting: Pharmacist

## 2021-06-28 ENCOUNTER — Ambulatory Visit: Payer: Medicare PPO

## 2021-06-28 ENCOUNTER — Ambulatory Visit: Payer: Medicare PPO | Admitting: Oncology

## 2021-06-28 ENCOUNTER — Inpatient Hospital Stay: Payer: Medicare PPO

## 2021-06-28 ENCOUNTER — Inpatient Hospital Stay: Payer: Medicare PPO | Attending: Oncology | Admitting: Pharmacist

## 2021-06-28 ENCOUNTER — Other Ambulatory Visit: Payer: Medicare PPO

## 2021-06-28 VITALS — BP 95/55 | HR 89 | Temp 97.1°F | Resp 17 | Ht 62.0 in | Wt 115.1 lb

## 2021-06-28 DIAGNOSIS — C9001 Multiple myeloma in remission: Secondary | ICD-10-CM | POA: Diagnosis present

## 2021-06-28 DIAGNOSIS — Z9484 Stem cells transplant status: Secondary | ICD-10-CM | POA: Insufficient documentation

## 2021-06-28 DIAGNOSIS — Z5112 Encounter for antineoplastic immunotherapy: Secondary | ICD-10-CM | POA: Insufficient documentation

## 2021-06-28 DIAGNOSIS — C9000 Multiple myeloma not having achieved remission: Secondary | ICD-10-CM

## 2021-06-28 DIAGNOSIS — Z79899 Other long term (current) drug therapy: Secondary | ICD-10-CM | POA: Insufficient documentation

## 2021-06-28 LAB — CBC WITH DIFFERENTIAL/PLATELET
Abs Immature Granulocytes: 0.01 10*3/uL (ref 0.00–0.07)
Basophils Absolute: 0 10*3/uL (ref 0.0–0.1)
Basophils Relative: 0 %
Eosinophils Absolute: 0.2 10*3/uL (ref 0.0–0.5)
Eosinophils Relative: 4 %
HCT: 36 % (ref 36.0–46.0)
Hemoglobin: 11.9 g/dL — ABNORMAL LOW (ref 12.0–15.0)
Immature Granulocytes: 0 %
Lymphocytes Relative: 10 %
Lymphs Abs: 0.6 10*3/uL — ABNORMAL LOW (ref 0.7–4.0)
MCH: 33 pg (ref 26.0–34.0)
MCHC: 33.1 g/dL (ref 30.0–36.0)
MCV: 99.7 fL (ref 80.0–100.0)
Monocytes Absolute: 0.3 10*3/uL (ref 0.1–1.0)
Monocytes Relative: 7 %
Neutro Abs: 4.2 10*3/uL (ref 1.7–7.7)
Neutrophils Relative %: 79 %
Platelets: 108 10*3/uL — ABNORMAL LOW (ref 150–400)
RBC: 3.61 MIL/uL — ABNORMAL LOW (ref 3.87–5.11)
RDW: 12.1 % (ref 11.5–15.5)
WBC: 5.3 10*3/uL (ref 4.0–10.5)
nRBC: 0 % (ref 0.0–0.2)

## 2021-06-28 LAB — COMPREHENSIVE METABOLIC PANEL
ALT: 27 U/L (ref 0–44)
AST: 26 U/L (ref 15–41)
Albumin: 3.8 g/dL (ref 3.5–5.0)
Alkaline Phosphatase: 90 U/L (ref 38–126)
Anion gap: 5 (ref 5–15)
BUN: 18 mg/dL (ref 8–23)
CO2: 24 mmol/L (ref 22–32)
Calcium: 8.5 mg/dL — ABNORMAL LOW (ref 8.9–10.3)
Chloride: 106 mmol/L (ref 98–111)
Creatinine, Ser: 1.07 mg/dL — ABNORMAL HIGH (ref 0.44–1.00)
GFR, Estimated: 58 mL/min — ABNORMAL LOW (ref >60)
Glucose, Bld: 163 mg/dL — ABNORMAL HIGH (ref 70–99)
Potassium: 4.8 mmol/L (ref 3.5–5.1)
Sodium: 135 mmol/L (ref 135–145)
Total Bilirubin: 0.4 mg/dL (ref 0.3–1.2)
Total Protein: 6.3 g/dL — ABNORMAL LOW (ref 6.5–8.1)

## 2021-06-28 MED ORDER — PROCHLORPERAZINE MALEATE 10 MG PO TABS: 10.0000 mg | ORAL_TABLET | Freq: Once | ORAL | Status: DC

## 2021-06-28 MED ORDER — BORTEZOMIB CHEMO SQ INJECTION 3.5 MG (2.5MG/ML)
1.3000 mg/m2 | Freq: Once | INTRAMUSCULAR | Status: AC
  Administered 2021-06-28: 2 mg via SUBCUTANEOUS
  Filled 2021-06-28: qty 0.8

## 2021-06-28 NOTE — Progress Notes (Signed)
? ?Oral Chemotherapy Clinic ?[place]  ?[phone] [phone] ? ?Patient Care Team: ?Albina Billet, MD as PCP - General (Internal Medicine) ?Robert Bellow, MD as Consulting Physician (General Surgery)  ? ?Name of the patient: Hannah Mcdonald  ?[id number]  ?[birth year]  ? ?Date of visit: 06/28/21 ? ?HPI: Patient is a 66 y.o. 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 Post transplant consolidation was started on 06/21/21, she will receive 2 cycles.  ? ?Reason for Consult: Treatment follow-up  ? ?PAST MEDICAL HISTORY: ?Past Medical History:  ?Diagnosis Date  ? Anxiety   ? Diabetes mellitus without complication ([place]) 1884  ? GERD (gastroesophageal reflux disease)   ? Hyperlipidemia   ? Myeloma ([place])   ? Personal history of colonic polyps   ? Squamous cell carcinoma of skin 11/25/2013  ? Left chest. WD SCC with superficial infiltration.  ? Tachycardia   ? ? ?HEMATOLOGY/ONCOLOGY HISTORY:  ?Oncology History  ?Lambda light chain myeloma ([place])  ?08/24/2020 Initial Diagnosis  ? Lambda light chain myeloma ([place]) ? ?  ?09/16/2020 Cancer Staging  ? Staging form: Plasma Cell Myeloma and Plasma Cell Disorders, AJCC 8th Edition ?- Clinical stage from 09/16/2020: RISS Stage I (Beta-2-microglobulin (mg/L): 2.5, Albumin (g/dL): 4.9, ISS: Stage I, High-risk cytogenetics: Absent, LDH: Normal) - Signed by Lloyd Huger, MD on 09/16/2020 ?Beta 2 microglobulin range (mg/L): Less than 3.5 ?Albumin range (g/dL): Greater than or equal to 3.5 ?Cytogenetics: 1p deletion ? ?  ?11/02/2020 -  Chemotherapy  ? Patient is on Treatment Plan : POST TRANSPLANT DaraVRd (Daratumumab SQ) q21d x 2 Cycles (Consolidation)- cycle 5&6  ? ?  ?  ?08/09/2021 -  Chemotherapy  ? Patient is on Treatment Plan : MYELOMA Daratumumab q28d- Maintainance treatment w/ revlimid  ? ?  ?  ? ? ?ALLERGIES:  has No Known Allergies. ? ?MEDICATIONS:  ?Current Outpatient Medications   ?Medication Sig Dispense Refill  ? acetaminophen (TYLENOL) 325 MG tablet Take 650 mg by mouth 2 (two) times a week. Premed for velcade. Taking Tuesdays and Friday    ? acyclovir (ZOVIRAX) 400 MG tablet TAKE 1 TABLET BY MOUTH TWICE DAILY 60 tablet 5  ? ASPIRIN 81 PO Take 81 mg by mouth daily.    ? atorvastatin (LIPITOR) 20 MG tablet Take 20 mg by mouth daily.    ? busPIRone (BUSPAR) 30 MG tablet Take 30 mg by mouth 2 (two) times daily.    ? cyclobenzaprine (FLEXERIL) 10 MG tablet TAKE 1 TABLET BY MOUTH AT BEDTIME 30 tablet 2  ? dexamethasone (DECADRON) 4 MG tablet Take 5 tablets (20 mg total) by mouth once a week. 20 tablet 3  ? diphenhydrAMINE (BENADRYL) 25 mg capsule Take 25 mg by mouth 2 (two) times a week. Pre med for velcade. Only Tuesdays and Fridays    ? gabapentin (NEURONTIN) 300 MG capsule Take 300 mg by mouth at bedtime.    ? lenalidomide (REVLIMID) 10 MG capsule Take 1 capsule (10 mg total) by mouth daily. Take for 14 days, then hold for 7 days. Repeat every 21 days. 14 capsule 0  ? lidocaine-prilocaine (EMLA) cream APPLY 1 APPLICATION TOPICALLY AS NEEDED 30 g 2  ? liraglutide (VICTOZA) 18 MG/3ML SOPN Inject 1.2 mg into the skin daily.    ? Magnesium 400 MG TABS Take 1 tablet by mouth daily.    ? meloxicam (MOBIC) 15 MG tablet Take 15 mg by mouth daily.    ?  methocarbamol (ROBAXIN) 500 MG tablet Take 1 tablet (500 mg total) by mouth every 6 (six) hours as needed for muscle spasms. 40 tablet 1  ? metoprolol succinate (TOPROL-XL) 50 MG 24 hr tablet Take 50 mg by mouth. Take 1.5 tabs in am. Take 1 tablet at HS.    ? morphine (MS CONTIN) 15 MG 12 hr tablet TAKE 2 TABLETS BY MOUTH EVERY 12 HOURS 120 tablet 0  ? Multiple Vitamin (MULTI-VITAMIN) tablet Take 1 tablet by mouth daily.    ? omeprazole (PRILOSEC) 40 MG capsule Take 40 mg by mouth daily.    ? ondansetron (ZOFRAN) 8 MG tablet Take 1 tablet (8 mg total) by mouth 2 (two) times daily as needed (Nausea or vomiting). 60 tablet 2  ? oxyCODONE-acetaminophen  (PERCOCET/ROXICET) 5-325 MG tablet TAKE 1-2 TABLETS BY MOUTH EVERY 4 HOURS AS NEEDED FOR SEVERE PAIN 90 tablet 0  ? prochlorperazine (COMPAZINE) 10 MG tablet Take 1 tablet (10 mg total) by mouth every 6 (six) hours as needed (Nausea or vomiting). 60 tablet 2  ? traMADol (ULTRAM) 50 MG tablet TAKE 1 TABLET BY MOUTH TWICE DAILY AS NEEDED FOR MODERATE PAIN 60 tablet 1  ? diphenoxylate-atropine (LOMOTIL) 2.5-0.025 MG tablet Take by mouth as needed.    ? ?No current facility-administered medications for this visit.  ? ?Facility-Administered Medications Ordered in Other Visits  ?Medication Dose Route Frequency Provider Last Rate Last Admin  ? 0.9 %  sodium chloride infusion   Intravenous Continuous Lloyd Huger, MD   Stopped at 04/07/21 1142  ? bortezomib SQ (VELCADE) chemo injection (2.59m/mL concentration) 2 mg  1.3 mg/m2 (Order-Specific) Subcutaneous Once FLloyd Huger MD      ? prochlorperazine (COMPAZINE) tablet 10 mg  10 mg Oral Once FLloyd Huger MD      ? sodium chloride flush (NS) 0.9 % injection 10 mL  10 mL Intravenous PRN FLloyd Huger MD   10 mL at 11/23/20 0932  ? ? ?VITAL SIGNS: ?BP (!) 95/55   Pulse 89   Temp (!) 97.1 ?F (36.2 ?C) (Tympanic)   Resp 17   Ht '5\' 2"'  (1.575 m)   Wt 52.2 kg (115 lb 1.6 oz)   SpO2 99%   BMI 21.05 kg/m?  ?Filed Weights  ? 06/28/21 1335  ?Weight: 52.2 kg (115 lb 1.6 oz)  ?  ?Estimated body mass index is 21.05 kg/m? as calculated from the following: ?  Height as of this encounter: '5\' 2"'  (1.575 m). ?  Weight as of this encounter: 52.2 kg (115 lb 1.6 oz). ? ?LABS: ?CBC: ?   ?Component Value Date/Time  ? WBC 5.3 06/28/2021 1319  ? HGB 11.9 (L) 06/28/2021 1319  ? HCT 36.0 06/28/2021 1319  ? PLT 108 (L) 06/28/2021 1319  ? MCV 99.7 06/28/2021 1319  ? NEUTROABS 4.2 06/28/2021 1319  ? LYMPHSABS 0.6 (L) 06/28/2021 1319  ? MONOABS 0.3 06/28/2021 1319  ? EOSABS 0.2 06/28/2021 1319  ? BASOSABS 0.0 06/28/2021 1319  ? ?Comprehensive Metabolic Panel: ?    ?Component Value Date/Time  ? NA 135 06/28/2021 1319  ? K 4.8 06/28/2021 1319  ? CL 106 06/28/2021 1319  ? CO2 24 06/28/2021 1319  ? BUN 18 06/28/2021 1319  ? CREATININE 1.07 (H) 06/28/2021 1319  ? GLUCOSE 163 (H) 06/28/2021 1319  ? CALCIUM 8.5 (L) 06/28/2021 1319  ? AST 26 06/28/2021 1319  ? ALT 27 06/28/2021 1319  ? ALKPHOS 90 06/28/2021 1319  ? BILITOT 0.4 06/28/2021 1319  ?  PROT 6.3 (L) 06/28/2021 1319  ? ALBUMIN 3.8 06/28/2021 1319  ? ? ? ?Present during today's visit: patient and her husband ? ?Assessment and Plan: ?CMP/CBC reviewed with patient ?Decrease in her platelet count from last week, not unexpected with the recent reinitiation of treatment, will continue to monitor ?Calcium slightly decreased, she will restart her home OTC supplement ?SCr stable, based on patient's past levels, continue to monitor ?Continue with treatment today and with home lenalidomide ?  ?Oral Chemotherapy Side Effect/Intolerance:  ?Neuropathy: mild, unchanged since restarting treatment ?Nausea: patient has a history of nausea with treatment, so since restarting treatment she has started taking scheduled anti-emetics. She take prochlorperazine twice daily and ondansetron in AM.  ?Diarrhea: one incidence since starting treatment last week. She was able to manage this effectively with loperamide ?Photosensitivity: likely from lenalidomide, reminded patient where sunscreen, considering using a face moisturizer with SPF, wear hats, and SPF protectant clothing when possible ?No reported edema, rash, fatigue ? ?Oral Chemotherapy Adherence: No missed doses reported ?No patient barriers to medication adherence identified.  ? ?New medications: None reported ? ?Medication Access Issues: No issues ? ?Patient expressed understanding and was in agreement with this plan. She also understands that She can call clinic at any time with any questions, concerns, or complaints.  ? ?Follow-up plan: RTC later this week for bortezomib and in 2 weeks for  cycle 2 of consolidation ? ?Thank you for allowing me to participate in the care of this very pleasant patient.  ? ?Time Total: 15 mins ? ?Visit consisted of counseling and education on dealing with issues of symptom

## 2021-06-29 LAB — KAPPA/LAMBDA LIGHT CHAINS
Kappa free light chain: 7.7 mg/L (ref 3.3–19.4)
Kappa, lambda light chain ratio: 2.48 — ABNORMAL HIGH (ref 0.26–1.65)
Lambda free light chains: 3.1 mg/L — ABNORMAL LOW (ref 5.7–26.3)

## 2021-06-30 ENCOUNTER — Other Ambulatory Visit: Payer: Self-pay | Admitting: Oncology

## 2021-06-30 DIAGNOSIS — C9000 Multiple myeloma not having achieved remission: Secondary | ICD-10-CM

## 2021-07-01 ENCOUNTER — Inpatient Hospital Stay: Payer: Medicare PPO

## 2021-07-01 ENCOUNTER — Ambulatory Visit: Payer: Medicare PPO

## 2021-07-01 VITALS — BP 106/52 | HR 89 | Temp 97.6°F

## 2021-07-01 DIAGNOSIS — C9000 Multiple myeloma not having achieved remission: Secondary | ICD-10-CM

## 2021-07-01 DIAGNOSIS — Z5112 Encounter for antineoplastic immunotherapy: Secondary | ICD-10-CM | POA: Diagnosis not present

## 2021-07-01 MED ORDER — BORTEZOMIB CHEMO SQ INJECTION 3.5 MG (2.5MG/ML)
1.3000 mg/m2 | Freq: Once | INTRAMUSCULAR | Status: AC
  Administered 2021-07-01: 2 mg via SUBCUTANEOUS
  Filled 2021-07-01: qty 0.8

## 2021-07-01 NOTE — Progress Notes (Signed)
Pt states she took Compazine at home this am ?

## 2021-07-01 NOTE — Patient Instructions (Addendum)
[facility]  Discharge Instructions: ?Thank you for choosing [facility] to provide your oncology and hematology care.  ?If you have a lab appointment with the Cancer Center, please go directly to the Cancer Center and check in at the registration area. ? ?Wear comfortable clothing and clothing appropriate for easy access to any Portacath or PICC line.  ? ?We strive to give you quality time with your provider. You may need to reschedule your appointment if you arrive late (15 or more minutes).  Arriving late affects you and other patients whose appointments are after yours.  Also, if you miss three or more appointments without notifying the office, you may be dismissed from the clinic at the provider?s discretion.    ?  ?For prescription refill requests, have your pharmacy contact our office and allow 72 hours for refills to be completed.   ? ?Today you received the following chemotherapy and/or immunotherapy agents VELCADE    ?  ?To help prevent nausea and vomiting after your treatment, we encourage you to take your nausea medication as directed. ? ?BELOW ARE SYMPTOMS THAT SHOULD BE REPORTED IMMEDIATELY: ?*FEVER GREATER THAN 100.4 F (38 ?C) OR HIGHER ?*CHILLS OR SWEATING ?*NAUSEA AND VOMITING THAT IS NOT CONTROLLED WITH YOUR NAUSEA MEDICATION ?*UNUSUAL SHORTNESS OF BREATH ?*UNUSUAL BRUISING OR BLEEDING ?*URINARY PROBLEMS (pain or burning when urinating, or frequent urination) ?*BOWEL PROBLEMS (unusual diarrhea, constipation, pain near the anus) ?TENDERNESS IN MOUTH AND THROAT WITH OR WITHOUT PRESENCE OF ULCERS (sore throat, sores in mouth, or a toothache) ?UNUSUAL RASH, SWELLING OR PAIN  ?UNUSUAL VAGINAL DISCHARGE OR ITCHING  ? ?Items with * indicate a potential emergency and should be followed up as soon as possible or go to the Emergency Department if any problems should occur. ? ?Please show the CHEMOTHERAPY ALERT CARD or IMMUNOTHERAPY ALERT CARD at check-in to the  Emergency Department and triage nurse. ? ?Should you have questions after your visit or need to cancel or reschedule your appointment, please contact [facility]  [phone] and follow the prompts.  Office hours are 8:00 a.m. to 4:30 p.m. Monday - Friday. Please note that voicemails left after 4:00 p.m. may not be returned until the following business day.  We are closed weekends and major holidays. You have access to a nurse at all times for urgent questions. Please call the main number to the clinic [phone] and follow the prompts. ? ?For any non-urgent questions, you may also contact your provider using MyChart. We now offer e-Visits for anyone 18 and older to request care online for non-urgent symptoms. For details visit [email or web address]. ?  ?Also download the MyChart app! Go to the app store, search "MyChart", open the app, select [facility], and log in with your MyChart username and password. ? ?Due to Covid, a mask is required upon entering the hospital/clinic. If you do not have a mask, one will be given to you upon arrival. For doctor visits, patients may have 1 support person aged 18 or older with them. For treatment visits, patients cannot have anyone with them due to current Covid guidelines and our immunocompromised population.  ? ?Bortezomib injection ?What is this medication? ?BORTEZOMIB (bor TEZ oh mib) targets proteins in cancer cells and stops the cancer cells from growing. It treats multiple myeloma and mantle cell lymphoma. ?This medicine may be used for other purposes; ask your health care provider or pharmacist if you have questions. ?COMMON BRAND NAME(S): Velcade ?What   should I tell my care team before I take this medication? ?They need to know if you have any of these conditions: ?dehydration ?diabetes (high blood sugar) ?heart disease ?liver disease ?tingling of the fingers or toes or other nerve disorder ?an unusual or allergic reaction to  bortezomib, mannitol, boron, other medicines, foods, dyes, or preservatives ?pregnant or trying to get pregnant ?breast-feeding ?How should I use this medication? ?This medicine is injected into a vein or under the skin. It is given by a health care provider in a hospital or clinic setting. ?Talk to your health care provider about the use of this medicine in children. Special care may be needed. ?Overdosage: If you think you have taken too much of this medicine contact a poison control center or emergency room at once. ?NOTE: This medicine is only for you. Do not share this medicine with others. ?What if I miss a dose? ?Keep appointments for follow-up doses. It is important not to miss your dose. Call your health care provider if you are unable to keep an appointment. ?What may interact with this medication? ?This medicine may interact with the following medications: ?ketoconazole ?rifampin ?This list may not describe all possible interactions. Give your health care provider a list of all the medicines, herbs, non-prescription drugs, or dietary supplements you use. Also tell them if you smoke, drink alcohol, or use illegal drugs. Some items may interact with your medicine. ?What should I watch for while using this medication? ?Your condition will be monitored carefully while you are receiving this medicine. ?You may need blood work done while you are taking this medicine. ?You may get drowsy or dizzy. Do not drive, use machinery, or do anything that needs mental alertness until you know how this medicine affects you. Do not stand up or sit up quickly, especially if you are an older patient. This reduces the risk of dizzy or fainting spells ?This medicine may increase your risk of getting an infection. Call your health care provider for advice if you get a fever, chills, sore throat, or other symptoms of a cold or flu. Do not treat yourself. Try to avoid being around people who are sick. ?Check with your health care  provider if you have severe diarrhea, nausea, and vomiting, or if you sweat a lot. The loss of too much body fluid may make it dangerous for you to take this medicine. ?Do not become pregnant while taking this medicine or for 7 months after stopping it. Women should inform their health care provider if they wish to become pregnant or think they might be pregnant. Men should not father a child while taking this medicine and for 4 months after stopping it. There is a potential for serious harm to an unborn child. Talk to your health care provider for more information. Do not breast-feed an infant while taking this medicine or for 2 months after stopping it. ?This medicine may make it more difficult to get pregnant or father a child. Talk to your health care provider if you are concerned about your fertility. ?What side effects may I notice from receiving this medication? ?Side effects that you should report to your doctor or health care professional as soon as possible: ?allergic reactions (skin rash; itching or hives; swelling of the face, lips, or tongue) ?bleeding (bloody or black, tarry stools; red or dark brown urine; spitting up blood or brown material that looks like coffee grounds; red spots on the skin; unusual bruising or bleeding from the eye,  gums, or nose) ?blurred vision or changes in vision ?confusion ?constipation ?headache ?heart failure (trouble breathing; fast, irregular heartbeat; sudden weight gain; swelling of the ankles, feet, hands) ?infection (fever, chills, cough, sore throat, pain or trouble passing urine) ?lack or loss of appetite ?liver injury (dark yellow or brown urine; general ill feeling or flu-like symptoms; loss of appetite, right upper belly pain; yellowing of the eyes or skin) ?low blood pressure (dizziness; feeling faint or lightheaded, falls; unusually weak or tired) ?muscle cramps ?pain, redness, or irritation at site where injected ?pain, tingling, numbness in the hands or  feet ?seizures ?trouble breathing ?unusual bruising or bleeding ?Side effects that usually do not require medical attention (report to your doctor or health care professional if they continue or are botherso

## 2021-07-05 ENCOUNTER — Other Ambulatory Visit: Payer: Self-pay | Admitting: Oncology

## 2021-07-05 ENCOUNTER — Other Ambulatory Visit: Payer: Self-pay | Admitting: Emergency Medicine

## 2021-07-05 DIAGNOSIS — C9000 Multiple myeloma not having achieved remission: Secondary | ICD-10-CM

## 2021-07-05 MED ORDER — LENALIDOMIDE 10 MG PO CAPS: 10.0000 mg | ORAL_CAPSULE | Freq: Every day | ORAL | 0 refills | Status: DC

## 2021-07-05 NOTE — Addendum Note (Signed)
Addended by: Wilford Corner on: 07/05/2021 12:56 PM ? ? Modules accepted: Orders ? ?

## 2021-07-06 ENCOUNTER — Encounter: Payer: Self-pay | Admitting: Pharmacist

## 2021-07-06 ENCOUNTER — Encounter: Payer: Self-pay | Admitting: Oncology

## 2021-07-06 NOTE — Telephone Encounter (Signed)
Called patient to address her concerns. The refills she requested have been sent in. ?

## 2021-07-10 NOTE — Progress Notes (Signed)
?[facility]  ?[phone] [phone] ? ?ID: [name] OB: Oct 16, 1955  MR#: [id number]  JQZ#:[id number] ? ?Patient Care Team: ?Albina Billet, MD as PCP - General (Internal Medicine) ?Robert Bellow, MD as Consulting Physician (General Surgery) ? ? 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 ? ?INTERVAL HISTORY: Patient returns to clinic today for further evaluation and consideration of cycle 6, day 1 of daratumumab, Velcade, and Revlimid.  She continues to have a mild peripheral neuropathy, but otherwise feels well.  She has no other neurologic complaints.  She has chronic back pain.  She denies any recent fevers or illnesses.  She has no chest pain, shortness of breath, cough, or hemoptysis.  She denies any nausea, vomiting, constipation.  Her diarrhea has resolved.  She has no urinary complaints.  Patient offers no further specific complaints today. ? ?REVIEW OF SYSTEMS:   ?Review of Systems  ?Constitutional: Negative.  Negative for fever, malaise/fatigue and weight loss.  ?Respiratory: Negative.  Negative for cough, hemoptysis and shortness of breath.   ?Cardiovascular:  Negative for chest pain and leg swelling.  ?Gastrointestinal: Negative.  Negative for abdominal pain, diarrhea and nausea.  ?Genitourinary: Negative.  Negative for dysuria.  ?Musculoskeletal:  Positive for back pain. Negative for joint pain.  ?Skin: Negative.  Negative for rash.  ?Neurological:  Positive for sensory change. Negative for dizziness, tremors, focal weakness, weakness and headaches.  ?Psychiatric/Behavioral: Negative.  The patient is not nervous/anxious.   ? ?As per HPI. Otherwise, a complete review of systems is negative. ? ?PAST MEDICAL HISTORY: ?Past Medical History:  ?Diagnosis Date  ? Anxiety   ? Diabetes mellitus without complication ([place]) 6226  ? GERD (gastroesophageal reflux disease)   ? Hyperlipidemia   ? Myeloma ([place])   ? Personal history of colonic  polyps   ? Squamous cell carcinoma of skin 11/25/2013  ? Left chest. WD SCC with superficial infiltration.  ? Tachycardia   ? ? ?PAST SURGICAL HISTORY: ?Past Surgical History:  ?Procedure Laterality Date  ? AUGMENTATION MAMMAPLASTY Bilateral 1983  ? silicone and replacement in 2001  ? [place]  ? [place]  ? COLONOSCOPY  1988, [id number], 2008, 2013  ? COLONOSCOPY WITH PROPOFOL N/A 08/16/2016  ? Procedure: COLONOSCOPY WITH PROPOFOL;  Surgeon: Robert Bellow, MD;  Location: [facility] ENDOSCOPY;  Service: Endoscopy;  Laterality: N/A;  ? DILATION AND CURETTAGE OF UTERUS    ? ENDOMETRIAL ABLATION  12/1991  ? ganglion cyst removal     ? IR IMAGING GUIDED PORT INSERTION  10/27/2020  ? IR IMAGING GUIDED PORT INSERTION  04/04/2021  ? KYPHOPLASTY N/A 10/28/2020  ? Procedure: T12 and L3 KYPHOPLASTY;  Surgeon: Hessie Knows, MD;  Location: [facility] ORS;  Service: Orthopedics;  Laterality: N/A;  ? TONSILLECTOMY    ? WRIST SURGERY Right 05/1995  ? ? ?FAMILY HISTORY: ?Family History  ?Problem Relation Age of Onset  ? Colon polyps Sister   ? Colon cancer Father 37  ? Diabetes Mother   ? Breast cancer Neg Hx   ? ? ?ADVANCED DIRECTIVES (Y/N):  N ? ?HEALTH MAINTENANCE: ?Social History  ? ?Tobacco Use  ? Smoking status: Former  ?  Packs/day: 1.00  ?  Years: 4.00  ?  Pack years: 4.00  ?  Types: Cigarettes  ?  Quit date: 24  ?  Years since quitting: 41.3  ? Smokeless tobacco: Never  ?Vaping Use  ? Vaping Use: Never used  ?  Substance Use Topics  ? Alcohol use: Yes  ?  Alcohol/week: 1.0 - 2.0 standard drink  ?  Types: 1 - 2 Glasses of wine per week  ?  Comment: occassionally  ? Drug use: No  ? ? ? Colonoscopy: ? PAP: ? Bone density: ? Lipid panel: ? ?No Known Allergies ? ?Current Outpatient Medications  ?Medication Sig Dispense Refill  ? acetaminophen (TYLENOL) 325 MG tablet Take 650 mg by mouth 2 (two) times a week. Premed for velcade. Taking Tuesdays and Friday    ? acyclovir (ZOVIRAX) 400 MG tablet  TAKE 1 TABLET BY MOUTH TWICE DAILY 60 tablet 5  ? ASPIRIN 81 PO Take 81 mg by mouth daily.    ? atorvastatin (LIPITOR) 20 MG tablet Take 20 mg by mouth daily.    ? busPIRone (BUSPAR) 30 MG tablet Take 30 mg by mouth 2 (two) times daily.    ? cyclobenzaprine (FLEXERIL) 10 MG tablet TAKE 1 TABLET BY MOUTH AT BEDTIME 30 tablet 2  ? dexamethasone (DECADRON) 4 MG tablet Take 5 tablets (20 mg total) by mouth once a week. 20 tablet 3  ? diphenhydrAMINE (BENADRYL) 25 mg capsule Take 25 mg by mouth 2 (two) times a week. Pre med for velcade. Only Tuesdays and Fridays    ? diphenoxylate-atropine (LOMOTIL) 2.5-0.025 MG tablet Take by mouth as needed.    ? gabapentin (NEURONTIN) 300 MG capsule Take 300 mg by mouth at bedtime.    ? lenalidomide (REVLIMID) 10 MG capsule Take 1 capsule (10 mg total) by mouth daily. Take for 14 days, then hold for 7 days. Repeat every 21 days. 14 capsule 0  ? lidocaine-prilocaine (EMLA) cream APPLY 1 APPLICATION TOPICALLY AS NEEDED 30 g 2  ? liraglutide (VICTOZA) 18 MG/3ML SOPN Inject 1.2 mg into the skin daily.    ? Magnesium 400 MG TABS Take 1 tablet by mouth daily.    ? meloxicam (MOBIC) 15 MG tablet Take 15 mg by mouth daily.    ? methocarbamol (ROBAXIN) 500 MG tablet Take 1 tablet (500 mg total) by mouth every 6 (six) hours as needed for muscle spasms. 40 tablet 1  ? metoprolol succinate (TOPROL-XL) 50 MG 24 hr tablet Take 50 mg by mouth. Take 1.5 tabs in am. Take 1 tablet at HS.    ? morphine (MS CONTIN) 15 MG 12 hr tablet TAKE 2 TABLETS BY MOUTH EVERY 12 HOURS 120 tablet 0  ? Multiple Vitamin (MULTI-VITAMIN) tablet Take 1 tablet by mouth daily.    ? omeprazole (PRILOSEC) 40 MG capsule Take 40 mg by mouth daily.    ? ondansetron (ZOFRAN) 8 MG tablet Take 1 tablet (8 mg total) by mouth 2 (two) times daily as needed (Nausea or vomiting). 60 tablet 2  ? oxyCODONE-acetaminophen (PERCOCET/ROXICET) 5-325 MG tablet TAKE 1-2 TABLETS BY MOUTH EVERY 4 HOURS AS NEEDED FOR SEVERE PAIN 90 tablet 0  ?  prochlorperazine (COMPAZINE) 10 MG tablet TAKE 1 TABLET BY MOUTH EVERY 6 HOURS AS NEEDED FOR NAUSEA OR VOMITING 60 tablet 2  ? traMADol (ULTRAM) 50 MG tablet Take 1 tablet (50 mg total) by mouth every 6 (six) hours as needed. 60 tablet 1  ? ALPRAZolam (XANAX) 0.25 MG tablet Take 1 tablet (0.25 mg total) by mouth at bedtime as needed for sleep. 30 tablet 0  ? ?No current facility-administered medications for this visit.  ? ?Facility-Administered Medications Ordered in Other Visits  ?Medication Dose Route Frequency Provider Last Rate Last Admin  ? 0.9 %  sodium chloride infusion   Intravenous Continuous Lloyd Huger, MD   Stopped at 04/07/21 1142  ? 0.9 %  sodium chloride infusion   Intravenous Continuous Lloyd Huger, MD   Stopped at 07/12/21 1055  ? acetaminophen (TYLENOL) tablet 650 mg  650 mg Oral Once Lloyd Huger, MD      ? diphenhydrAMINE (BENADRYL) capsule 25 mg  25 mg Oral Once Lloyd Huger, MD      ? sodium chloride flush (NS) 0.9 % injection 10 mL  10 mL Intravenous PRN Lloyd Huger, MD   10 mL at 11/23/20 0932  ? ? ?OBJECTIVE: ?Vitals:  ? 07/12/21 0852  ?BP: (!) 85/40  ?Pulse: 93  ?Resp: 16  ?Temp: (!) 97.5 ?F (36.4 ?C)  ?SpO2: 100%  ?   Body mass index is 21.03 kg/m?Marland Kitchen    ECOG FS:0 - Asymptomatic ? ?General: Well-developed, well-nourished, no acute distress. ?Eyes: Pink conjunctiva, anicteric sclera. ?HEENT: Normocephalic, moist mucous membranes. ?Lungs: No audible wheezing or coughing. ?Heart: Regular rate and rhythm. ?Abdomen: Soft, nontender, no obvious distention. ?Musculoskeletal: No edema, cyanosis, or clubbing. ?Neuro: Alert, answering all questions appropriately. Cranial nerves grossly intact. ?Skin: No rashes or petechiae noted. ?Psych: Normal affect. ? ?LAB RESULTS: ? ?Lab Results  ?Component Value Date  ? NA 136 07/12/2021  ? K 4.3 07/12/2021  ? CL 104 07/12/2021  ? CO2 24 07/12/2021  ? GLUCOSE 213 (H) 07/12/2021  ? BUN 18 07/12/2021  ? CREATININE 1.09 (H)  07/12/2021  ? CALCIUM 8.6 (L) 07/12/2021  ? PROT 6.0 (L) 07/12/2021  ? ALBUMIN 3.5 07/12/2021  ? AST 40 07/12/2021  ? ALT 49 (H) 07/12/2021  ? ALKPHOS 111 07/12/2021  ? BILITOT 0.5 07/12/2021  ? GFRNONAA 56 (L) 0

## 2021-07-12 ENCOUNTER — Inpatient Hospital Stay: Payer: Medicare PPO

## 2021-07-12 ENCOUNTER — Inpatient Hospital Stay: Payer: Medicare PPO | Admitting: Oncology

## 2021-07-12 ENCOUNTER — Encounter: Payer: Self-pay | Admitting: Oncology

## 2021-07-12 ENCOUNTER — Other Ambulatory Visit: Payer: Self-pay

## 2021-07-12 VITALS — BP 88/53 | HR 94

## 2021-07-12 VITALS — BP 85/40 | HR 93 | Temp 97.5°F | Resp 16 | Ht 62.0 in | Wt 115.0 lb

## 2021-07-12 DIAGNOSIS — C9000 Multiple myeloma not having achieved remission: Secondary | ICD-10-CM | POA: Diagnosis not present

## 2021-07-12 DIAGNOSIS — Z5112 Encounter for antineoplastic immunotherapy: Secondary | ICD-10-CM | POA: Diagnosis not present

## 2021-07-12 LAB — CBC WITH DIFFERENTIAL/PLATELET
Abs Immature Granulocytes: 0.02 10*3/uL (ref 0.00–0.07)
Basophils Absolute: 0 10*3/uL (ref 0.0–0.1)
Basophils Relative: 0 %
Eosinophils Absolute: 0.1 10*3/uL (ref 0.0–0.5)
Eosinophils Relative: 2 %
HCT: 33.1 % — ABNORMAL LOW (ref 36.0–46.0)
Hemoglobin: 10.9 g/dL — ABNORMAL LOW (ref 12.0–15.0)
Immature Granulocytes: 0 %
Lymphocytes Relative: 6 %
Lymphs Abs: 0.3 10*3/uL — ABNORMAL LOW (ref 0.7–4.0)
MCH: 32.4 pg (ref 26.0–34.0)
MCHC: 32.9 g/dL (ref 30.0–36.0)
MCV: 98.5 fL (ref 80.0–100.0)
Monocytes Absolute: 0.3 10*3/uL (ref 0.1–1.0)
Monocytes Relative: 6 %
Neutro Abs: 4.4 10*3/uL (ref 1.7–7.7)
Neutrophils Relative %: 86 %
Platelets: 122 10*3/uL — ABNORMAL LOW (ref 150–400)
RBC: 3.36 MIL/uL — ABNORMAL LOW (ref 3.87–5.11)
RDW: 12 % (ref 11.5–15.5)
WBC: 5.1 10*3/uL (ref 4.0–10.5)
nRBC: 0 % (ref 0.0–0.2)

## 2021-07-12 LAB — COMPREHENSIVE METABOLIC PANEL
ALT: 49 U/L — ABNORMAL HIGH (ref 0–44)
AST: 40 U/L (ref 15–41)
Albumin: 3.5 g/dL (ref 3.5–5.0)
Alkaline Phosphatase: 111 U/L (ref 38–126)
Anion gap: 8 (ref 5–15)
BUN: 18 mg/dL (ref 8–23)
CO2: 24 mmol/L (ref 22–32)
Calcium: 8.6 mg/dL — ABNORMAL LOW (ref 8.9–10.3)
Chloride: 104 mmol/L (ref 98–111)
Creatinine, Ser: 1.09 mg/dL — ABNORMAL HIGH (ref 0.44–1.00)
GFR, Estimated: 56 mL/min — ABNORMAL LOW (ref >60)
Glucose, Bld: 213 mg/dL — ABNORMAL HIGH (ref 70–99)
Potassium: 4.3 mmol/L (ref 3.5–5.1)
Sodium: 136 mmol/L (ref 135–145)
Total Bilirubin: 0.5 mg/dL (ref 0.3–1.2)
Total Protein: 6 g/dL — ABNORMAL LOW (ref 6.5–8.1)

## 2021-07-12 MED ORDER — HEPARIN SOD (PORK) LOCK FLUSH 100 UNIT/ML IV SOLN
500.0000 [IU] | Freq: Once | INTRAVENOUS | Status: AC | PRN
  Administered 2021-07-12: 500 [IU]
  Filled 2021-07-12: qty 5

## 2021-07-12 MED ORDER — BORTEZOMIB CHEMO SQ INJECTION 3.5 MG (2.5MG/ML)
1.3000 mg/m2 | Freq: Once | INTRAMUSCULAR | Status: AC
  Administered 2021-07-12: 2 mg via SUBCUTANEOUS
  Filled 2021-07-12: qty 0.8

## 2021-07-12 MED ORDER — DARATUMUMAB-HYALURONIDASE-FIHJ 1800-30000 MG-UT/15ML ~~LOC~~ SOLN
1800.0000 mg | Freq: Once | SUBCUTANEOUS | Status: AC
  Administered 2021-07-12: 1800 mg via SUBCUTANEOUS
  Filled 2021-07-12: qty 15

## 2021-07-12 MED ORDER — ZOLEDRONIC ACID 4 MG/5ML IV CONC
3.0000 mg | Freq: Once | INTRAVENOUS | Status: AC
  Administered 2021-07-12: 3 mg via INTRAVENOUS
  Filled 2021-07-12: qty 3.75

## 2021-07-12 MED ORDER — ALPRAZOLAM 0.25 MG PO TABS: 0.2500 mg | ORAL_TABLET | Freq: Every evening | ORAL | 0 refills | Status: DC | PRN

## 2021-07-12 MED ORDER — DIPHENHYDRAMINE HCL 25 MG PO CAPS: 25.0000 mg | ORAL_CAPSULE | Freq: Once | ORAL | Status: DC

## 2021-07-12 MED ORDER — ACETAMINOPHEN 325 MG PO TABS: 650.0000 mg | ORAL_TABLET | Freq: Once | ORAL | Status: DC

## 2021-07-12 MED ORDER — SODIUM CHLORIDE 0.9 % IV SOLN
INTRAVENOUS | Status: DC
  Filled 2021-07-12: qty 250

## 2021-07-12 NOTE — Progress Notes (Signed)
BP 85/40 in clinic. Per Dr Grayland Ormond okay to proceed with tx today. Pt asymptomatic ?

## 2021-07-12 NOTE — Patient Instructions (Signed)
[facility]  Discharge Instructions: °Thank you for choosing [facility] to provide your oncology and hematology care.  °If you have a lab appointment with the Cancer Center, please go directly to the Cancer Center and check in at the registration area. ° °Wear comfortable clothing and clothing appropriate for easy access to any Portacath or PICC line.  ° °We strive to give you quality time with your provider. You may need to reschedule your appointment if you arrive late (15 or more minutes).  Arriving late affects you and other patients whose appointments are after yours.  Also, if you miss three or more appointments without notifying the office, you may be dismissed from the clinic at the provider’s discretion.    °  °For prescription refill requests, have your pharmacy contact our office and allow 72 hours for refills to be completed.   ° °Today you received the following chemotherapy and/or immunotherapy agents     °  °To help prevent nausea and vomiting after your treatment, we encourage you to take your nausea medication as directed. ° °BELOW ARE SYMPTOMS THAT SHOULD BE REPORTED IMMEDIATELY: °*FEVER GREATER THAN 100.4 F (38 °C) OR HIGHER °*CHILLS OR SWEATING °*NAUSEA AND VOMITING THAT IS NOT CONTROLLED WITH YOUR NAUSEA MEDICATION °*UNUSUAL SHORTNESS OF BREATH °*UNUSUAL BRUISING OR BLEEDING °*URINARY PROBLEMS (pain or burning when urinating, or frequent urination) °*BOWEL PROBLEMS (unusual diarrhea, constipation, pain near the anus) °TENDERNESS IN MOUTH AND THROAT WITH OR WITHOUT PRESENCE OF ULCERS (sore throat, sores in mouth, or a toothache) °UNUSUAL RASH, SWELLING OR PAIN  °UNUSUAL VAGINAL DISCHARGE OR ITCHING  ° °Items with * indicate a potential emergency and should be followed up as soon as possible or go to the Emergency Department if any problems should occur. ° °Please show the CHEMOTHERAPY ALERT CARD or IMMUNOTHERAPY ALERT CARD at check-in to the  Emergency Department and triage nurse. ° °Should you have questions after your visit or need to cancel or reschedule your appointment, please contact [facility]  [phone] and follow the prompts.  Office hours are 8:00 a.m. to 4:30 p.m. Monday - Friday. Please note that voicemails left after 4:00 p.m. may not be returned until the following business day.  We are closed weekends and major holidays. You have access to a nurse at all times for urgent questions. Please call the main number to the clinic [phone] and follow the prompts. ° °For any non-urgent questions, you may also contact your provider using MyChart. We now offer e-Visits for anyone 18 and older to request care online for non-urgent symptoms. For details visit [email or web address]. °  °Also download the MyChart app! Go to the app store, search "MyChart", open the app, select [facility], and log in with your MyChart username and password. ° °Due to Covid, a mask is required upon entering the hospital/clinic. If you do not have a mask, one will be given to you upon arrival. For doctor visits, patients may have 1 support person aged 18 or older with them. For treatment visits, patients cannot have anyone with them due to current Covid guidelines and our immunocompromised population.  °

## 2021-07-15 ENCOUNTER — Inpatient Hospital Stay: Payer: Medicare PPO

## 2021-07-15 VITALS — BP 99/44 | HR 86 | Temp 95.0°F | Resp 20 | Wt 117.5 lb

## 2021-07-15 DIAGNOSIS — Z5112 Encounter for antineoplastic immunotherapy: Secondary | ICD-10-CM | POA: Diagnosis not present

## 2021-07-15 DIAGNOSIS — C9000 Multiple myeloma not having achieved remission: Secondary | ICD-10-CM

## 2021-07-15 MED ORDER — PROCHLORPERAZINE MALEATE 10 MG PO TABS: 10.0000 mg | ORAL_TABLET | Freq: Once | ORAL | Status: DC

## 2021-07-15 MED ORDER — BORTEZOMIB CHEMO SQ INJECTION 3.5 MG (2.5MG/ML)
1.3000 mg/m2 | Freq: Once | INTRAMUSCULAR | Status: AC
  Administered 2021-07-15: 2 mg via SUBCUTANEOUS
  Filled 2021-07-15: qty 0.8

## 2021-07-15 NOTE — Patient Instructions (Signed)
[facility]  Discharge Instructions: ?Thank you for choosing [facility] to provide your oncology and hematology care.  ?If you have a lab appointment with the Cancer Center, please go directly to the Cancer Center and check in at the registration area. ? ?Wear comfortable clothing and clothing appropriate for easy access to any Portacath or PICC line.  ? ?We strive to give you quality time with your provider. You may need to reschedule your appointment if you arrive late (15 or more minutes).  Arriving late affects you and other patients whose appointments are after yours.  Also, if you miss three or more appointments without notifying the office, you may be dismissed from the clinic at the provider?s discretion.    ?  ?For prescription refill requests, have your pharmacy contact our office and allow 72 hours for refills to be completed.   ? ?Today you received the following chemotherapy and/or immunotherapy agents Velcade    ?  ?To help prevent nausea and vomiting after your treatment, we encourage you to take your nausea medication as directed. ? ?BELOW ARE SYMPTOMS THAT SHOULD BE REPORTED IMMEDIATELY: ?*FEVER GREATER THAN 100.4 F (38 ?C) OR HIGHER ?*CHILLS OR SWEATING ?*NAUSEA AND VOMITING THAT IS NOT CONTROLLED WITH YOUR NAUSEA MEDICATION ?*UNUSUAL SHORTNESS OF BREATH ?*UNUSUAL BRUISING OR BLEEDING ?*URINARY PROBLEMS (pain or burning when urinating, or frequent urination) ?*BOWEL PROBLEMS (unusual diarrhea, constipation, pain near the anus) ?TENDERNESS IN MOUTH AND THROAT WITH OR WITHOUT PRESENCE OF ULCERS (sore throat, sores in mouth, or a toothache) ?UNUSUAL RASH, SWELLING OR PAIN  ?UNUSUAL VAGINAL DISCHARGE OR ITCHING  ? ?Items with * indicate a potential emergency and should be followed up as soon as possible or go to the Emergency Department if any problems should occur. ? ?Please show the CHEMOTHERAPY ALERT CARD or IMMUNOTHERAPY ALERT CARD at check-in to the  Emergency Department and triage nurse. ? ?Should you have questions after your visit or need to cancel or reschedule your appointment, please contact [facility]  [phone] and follow the prompts.  Office hours are 8:00 a.m. to 4:30 p.m. Monday - Friday. Please note that voicemails left after 4:00 p.m. may not be returned until the following business day.  We are closed weekends and major holidays. You have access to a nurse at all times for urgent questions. Please call the main number to the clinic [phone] and follow the prompts. ? ?For any non-urgent questions, you may also contact your provider using MyChart. We now offer e-Visits for anyone 18 and older to request care online for non-urgent symptoms. For details visit [email or web address]. ?  ?Also download the MyChart app! Go to the app store, search "MyChart", open the app, select , and log in with your MyChart username and password. ? ?Due to Covid, a mask is required upon entering the hospital/clinic. If you do not have a mask, one will be given to you upon arrival. For doctor visits, patients may have 1 support person aged 18 or older with them. For treatment visits, patients cannot have anyone with them due to current Covid guidelines and our immunocompromised population.  ?

## 2021-07-19 ENCOUNTER — Other Ambulatory Visit: Payer: Self-pay

## 2021-07-19 ENCOUNTER — Inpatient Hospital Stay: Payer: Medicare PPO

## 2021-07-19 ENCOUNTER — Other Ambulatory Visit: Payer: Self-pay | Admitting: Oncology

## 2021-07-19 ENCOUNTER — Inpatient Hospital Stay (HOSPITAL_BASED_OUTPATIENT_CLINIC_OR_DEPARTMENT_OTHER): Payer: Medicare PPO | Admitting: Hospice and Palliative Medicine

## 2021-07-19 VITALS — BP 90/58 | HR 94 | Temp 96.2°F | Resp 16

## 2021-07-19 DIAGNOSIS — Z5112 Encounter for antineoplastic immunotherapy: Secondary | ICD-10-CM | POA: Diagnosis not present

## 2021-07-19 DIAGNOSIS — E119 Type 2 diabetes mellitus without complications: Secondary | ICD-10-CM

## 2021-07-19 DIAGNOSIS — E785 Hyperlipidemia, unspecified: Secondary | ICD-10-CM

## 2021-07-19 DIAGNOSIS — Z8349 Family history of other endocrine, nutritional and metabolic diseases: Secondary | ICD-10-CM

## 2021-07-19 DIAGNOSIS — C9000 Multiple myeloma not having achieved remission: Secondary | ICD-10-CM

## 2021-07-19 DIAGNOSIS — J069 Acute upper respiratory infection, unspecified: Secondary | ICD-10-CM | POA: Diagnosis not present

## 2021-07-19 DIAGNOSIS — E860 Dehydration: Secondary | ICD-10-CM

## 2021-07-19 LAB — LIPID PANEL
Cholesterol: 130 mg/dL (ref 0–200)
HDL: 71 mg/dL (ref >40)
LDL Cholesterol: 41 mg/dL (ref 0–99)
Total CHOL/HDL Ratio: 1.8 RATIO
Triglycerides: 89 mg/dL (ref <150)
VLDL: 18 mg/dL (ref 0–40)

## 2021-07-19 LAB — COMPREHENSIVE METABOLIC PANEL
ALT: 148 U/L — ABNORMAL HIGH (ref 0–44)
AST: 136 U/L — ABNORMAL HIGH (ref 15–41)
Albumin: 3.6 g/dL (ref 3.5–5.0)
Alkaline Phosphatase: 150 U/L — ABNORMAL HIGH (ref 38–126)
Anion gap: 6 (ref 5–15)
BUN: 18 mg/dL (ref 8–23)
CO2: 24 mmol/L (ref 22–32)
Calcium: 8.3 mg/dL — ABNORMAL LOW (ref 8.9–10.3)
Chloride: 106 mmol/L (ref 98–111)
Creatinine, Ser: 1.24 mg/dL — ABNORMAL HIGH (ref 0.44–1.00)
GFR, Estimated: 48 mL/min — ABNORMAL LOW (ref >60)
Glucose, Bld: 142 mg/dL — ABNORMAL HIGH (ref 70–99)
Potassium: 4.6 mmol/L (ref 3.5–5.1)
Sodium: 136 mmol/L (ref 135–145)
Total Bilirubin: 0.5 mg/dL (ref 0.3–1.2)
Total Protein: 6.6 g/dL (ref 6.5–8.1)

## 2021-07-19 LAB — CBC WITH DIFFERENTIAL/PLATELET
Abs Immature Granulocytes: 0.02 10*3/uL (ref 0.00–0.07)
Basophils Absolute: 0 10*3/uL (ref 0.0–0.1)
Basophils Relative: 1 %
Eosinophils Absolute: 0.1 10*3/uL (ref 0.0–0.5)
Eosinophils Relative: 4 %
HCT: 34.2 % — ABNORMAL LOW (ref 36.0–46.0)
Hemoglobin: 11.1 g/dL — ABNORMAL LOW (ref 12.0–15.0)
Immature Granulocytes: 1 %
Lymphocytes Relative: 7 %
Lymphs Abs: 0.3 10*3/uL — ABNORMAL LOW (ref 0.7–4.0)
MCH: 31.7 pg (ref 26.0–34.0)
MCHC: 32.5 g/dL (ref 30.0–36.0)
MCV: 97.7 fL (ref 80.0–100.0)
Monocytes Absolute: 0.2 10*3/uL (ref 0.1–1.0)
Monocytes Relative: 6 %
Neutro Abs: 2.9 10*3/uL (ref 1.7–7.7)
Neutrophils Relative %: 81 %
Platelets: 76 10*3/uL — ABNORMAL LOW (ref 150–400)
RBC: 3.5 MIL/uL — ABNORMAL LOW (ref 3.87–5.11)
RDW: 12.5 % (ref 11.5–15.5)
WBC: 3.5 10*3/uL — ABNORMAL LOW (ref 4.0–10.5)
nRBC: 0 % (ref 0.0–0.2)

## 2021-07-19 LAB — HEMOGLOBIN A1C
Hgb A1c MFr Bld: 6.6 % — ABNORMAL HIGH (ref 4.8–5.6)
Mean Plasma Glucose: 142.72 mg/dL

## 2021-07-19 LAB — TSH: TSH: 1.209 u[IU]/mL (ref 0.350–4.500)

## 2021-07-19 MED ORDER — HEPARIN SOD (PORK) LOCK FLUSH 100 UNIT/ML IV SOLN
500.0000 [IU] | Freq: Once | INTRAVENOUS | Status: AC
  Administered 2021-07-19: 500 [IU] via INTRAVENOUS
  Filled 2021-07-19: qty 5

## 2021-07-19 MED ORDER — SODIUM CHLORIDE 0.9 % IV SOLN
INTRAVENOUS | Status: AC
  Filled 2021-07-19 (×2): qty 250

## 2021-07-19 MED ORDER — AMOXICILLIN-POT CLAVULANATE 875-125 MG PO TABS: 1.0000 | ORAL_TABLET | Freq: Two times a day (BID) | ORAL | 0 refills | Status: DC

## 2021-07-19 MED ORDER — SODIUM CHLORIDE 0.9% FLUSH
10.0000 mL | Freq: Once | INTRAVENOUS | Status: AC
  Administered 2021-07-19: 10 mL via INTRAVENOUS
  Filled 2021-07-19: qty 10

## 2021-07-19 NOTE — Progress Notes (Signed)
Symptom Management [place] at [facility] [phone] [phone]  Patient Care Team: Albina Billet, MD as PCP - General (Internal Medicine) Bary Castilla Forest Gleason, MD as Consulting Physician (General Surgery)   Name of the patient: Hannah Mcdonald  [id number]  April 20, 1955   Date of visit: 07/19/21  Reason for Consult: Bryan Omura is a 66 y.o. 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  Bone marrow biopsy on January 24, 2021 revealed complete remission.  Patient underwent stem cell transplant on February 24, 2021.  She was hospitalized in January with neutropenic fever but negative infectious disease work-up.  Patient is now on consolidation treatment with the GRIFFIN regimen.  Patient was here for day 8, cycle 6 Velcade.  She was an add-on to [facility] today for complaint of upper respiratory symptoms including cough, sinus pressure, nasal congestion.  She has postnasal drip.  She does endorse some mild shortness of breath but denies wheezing.  No fever or chills.  She has a history of sinus infections.  Denies any neurologic complaints. Denies recent fevers. Denies any easy bleeding or bruising. Reports good appetite and denies weight loss. Denies chest pain. Denies any nausea, vomiting, constipation, or diarrhea. Denies urinary complaints. Patient offers no further specific complaints today.    PAST MEDICAL HISTORY: Past Medical History:  Diagnosis Date   Anxiety    Diabetes mellitus without complication ([place]) 2683   GERD (gastroesophageal reflux disease)    Hyperlipidemia    Myeloma ([employer])    Personal history of colonic polyps    Squamous cell carcinoma of skin 11/25/2013   Left chest. WD SCC with superficial infiltration.   Tachycardia     PAST SURGICAL HISTORY:  Past Surgical History:  Procedure Laterality Date   AUGMENTATION MAMMAPLASTY Bilateral  4196   silicone and replacement in 2001   [place], [place], 1998,2003, 2008, 2013   COLONOSCOPY WITH PROPOFOL N/A 08/16/2016   Procedure: COLONOSCOPY WITH PROPOFOL;  Surgeon: Robert Bellow, MD;  Location: [facility] ENDOSCOPY;  Service: Endoscopy;  Laterality: N/A;   DILATION AND CURETTAGE OF UTERUS     ENDOMETRIAL ABLATION  12/1991   ganglion cyst removal      IR IMAGING GUIDED PORT INSERTION  10/27/2020   IR IMAGING GUIDED PORT INSERTION  04/04/2021   KYPHOPLASTY N/A 10/28/2020   Procedure: T12 and L3 KYPHOPLASTY;  Surgeon: Hessie Knows, MD;  Location: [facility] ORS;  Service: Orthopedics;  Laterality: N/A;   TONSILLECTOMY     WRIST SURGERY Right 05/1995    HEMATOLOGY/ONCOLOGY HISTORY:  Oncology History  Lambda light chain myeloma ([place])  08/24/2020 Initial Diagnosis   Lambda light chain myeloma ([place])    09/16/2020 Cancer Staging   Staging form: Plasma Cell Myeloma and Plasma Cell Disorders, AJCC 8th Edition - Clinical stage from 09/16/2020: RISS Stage I (Beta-2-microglobulin (mg/L): 2.5, Albumin (g/dL): 4.9, ISS: Stage I, High-risk cytogenetics: Absent, LDH: Normal) - Signed by Lloyd Huger, MD on 09/16/2020 Beta 2 microglobulin range (mg/L): Less than 3.5 Albumin range (g/dL): Greater than or equal to 3.5 Cytogenetics: 1p deletion    11/02/2020 -  Chemotherapy   Patient is on Treatment Plan : POST TRANSPLANT DaraVRd (Daratumumab SQ) q21d x 2 Cycles (Consolidation)- cycle 5&6      08/02/2021 -  Chemotherapy   Patient is on Treatment Plan : MYELOMA Daratumumab Ida  treatment w/ revlimid        ALLERGIES:  has No Known Allergies.  MEDICATIONS:  Current Outpatient Medications  Medication Sig Dispense Refill   acetaminophen (TYLENOL) 325 MG tablet Take 650 mg by mouth 2 (two) times a week. Premed for velcade. Taking Tuesdays and Friday     acyclovir (ZOVIRAX) 400 MG tablet TAKE 1 TABLET BY MOUTH TWICE  DAILY 60 tablet 5   ALPRAZolam (XANAX) 0.25 MG tablet Take 1 tablet (0.25 mg total) by mouth at bedtime as needed for sleep. 30 tablet 0   ASPIRIN 81 PO Take 81 mg by mouth daily.     atorvastatin (LIPITOR) 20 MG tablet Take 20 mg by mouth daily.     busPIRone (BUSPAR) 30 MG tablet Take 30 mg by mouth 2 (two) times daily.     cyclobenzaprine (FLEXERIL) 10 MG tablet TAKE 1 TABLET BY MOUTH AT BEDTIME 30 tablet 2   dexamethasone (DECADRON) 4 MG tablet Take 5 tablets (20 mg total) by mouth once a week. 20 tablet 3   diphenhydrAMINE (BENADRYL) 25 mg capsule Take 25 mg by mouth 2 (two) times a week. Pre med for velcade. Only Tuesdays and Fridays     diphenoxylate-atropine (LOMOTIL) 2.5-0.025 MG tablet Take by mouth as needed.     gabapentin (NEURONTIN) 300 MG capsule Take 300 mg by mouth at bedtime.     lenalidomide (REVLIMID) 10 MG capsule Take 1 capsule (10 mg total) by mouth daily. Take for 14 days, then hold for 7 days. Repeat every 21 days. 14 capsule 0   lidocaine-prilocaine (EMLA) cream APPLY 1 APPLICATION TOPICALLY AS NEEDED 30 g 2   liraglutide (VICTOZA) 18 MG/3ML SOPN Inject 1.2 mg into the skin daily.     Magnesium 400 MG TABS Take 1 tablet by mouth daily.     meloxicam (MOBIC) 15 MG tablet Take 15 mg by mouth daily.     methocarbamol (ROBAXIN) 500 MG tablet Take 1 tablet (500 mg total) by mouth every 6 (six) hours as needed for muscle spasms. 40 tablet 1   metoprolol succinate (TOPROL-XL) 50 MG 24 hr tablet Take 50 mg by mouth. Take 1.5 tabs in am. Take 1 tablet at HS.     morphine (MS CONTIN) 15 MG 12 hr tablet TAKE 2 TABLETS BY MOUTH EVERY 12 HOURS 120 tablet 0   Multiple Vitamin (MULTI-VITAMIN) tablet Take 1 tablet by mouth daily.     omeprazole (PRILOSEC) 40 MG capsule Take 40 mg by mouth daily.     ondansetron (ZOFRAN) 8 MG tablet Take 1 tablet (8 mg total) by mouth 2 (two) times daily as needed (Nausea or vomiting). 60 tablet 2   oxyCODONE-acetaminophen (PERCOCET/ROXICET) 5-325 MG  tablet TAKE 1-2 TABLETS BY MOUTH EVERY 4 HOURS AS NEEDED FOR SEVERE PAIN 90 tablet 0   prochlorperazine (COMPAZINE) 10 MG tablet TAKE 1 TABLET BY MOUTH EVERY 6 HOURS AS NEEDED FOR NAUSEA OR VOMITING 60 tablet 2   traMADol (ULTRAM) 50 MG tablet Take 1 tablet (50 mg total) by mouth every 6 (six) hours as needed. 60 tablet 1   No current facility-administered medications for this visit.   Facility-Administered Medications Ordered in Other Visits  Medication Dose Route Frequency Provider Last Rate Last Admin   0.9 %  sodium chloride infusion   Intravenous Continuous Lloyd Huger, MD   Stopped at 04/07/21 1142   heparin lock flush 100 unit/mL  500 Units Intravenous Once Lloyd Huger, MD  sodium chloride flush (NS) 0.9 % injection 10 mL  10 mL Intravenous PRN Lloyd Huger, MD   10 mL at 11/23/20 0932   sodium chloride flush (NS) 0.9 % injection 10 mL  10 mL Intravenous Once Lloyd Huger, MD        VITAL SIGNS: There were no vitals taken for this visit. There were no vitals filed for this visit.  Estimated body mass index is 21.49 kg/m as calculated from the following:   Height as of 07/12/21: _0  (1.575 m).   Weight as of 07/15/21: 117 lb 8.1 oz (53.3 kg).  LABS: CBC:    Component Value Date/Time   WBC 5.1 07/12/2021 0843   HGB 10.9 (L) 07/12/2021 0843   HCT 33.1 (L) 07/12/2021 0843   PLT 122 (L) 07/12/2021 0843   MCV 98.5 07/12/2021 0843   NEUTROABS 4.4 07/12/2021 0843   LYMPHSABS 0.3 (L) 07/12/2021 0843   MONOABS 0.3 07/12/2021 0843   EOSABS 0.1 07/12/2021 0843   BASOSABS 0.0 07/12/2021 0843   Comprehensive Metabolic Panel:    Component Value Date/Time   NA 136 07/12/2021 0843   K 4.3 07/12/2021 0843   CL 104 07/12/2021 0843   CO2 24 07/12/2021 0843   BUN 18 07/12/2021 0843   CREATININE 1.09 (H) 07/12/2021 0843   GLUCOSE 213 (H) 07/12/2021 0843   CALCIUM 8.6 (L) 07/12/2021 0843   AST 40 07/12/2021 0843   ALT 49 (H) 07/12/2021 0843    ALKPHOS 111 07/12/2021 0843   BILITOT 0.5 07/12/2021 0843   PROT 6.0 (L) 07/12/2021 0843   ALBUMIN 3.5 07/12/2021 0843    RADIOGRAPHIC STUDIES: No results found.  PERFORMANCE STATUS (ECOG) : 1 - Symptomatic but completely ambulatory  Review of Systems Unless otherwise noted, a complete review of systems is negative.  Physical Exam General: NAD HEENT: No exudate OP, no lymphadenopathy Cardiovascular: regular rate and rhythm Pulmonary: clear anterior/posterior fields Abdomen: soft, nontender, + bowel sounds GU: no suprapubic tenderness Extremities: no edema, no joint deformities Skin: no rashes Neurological: Weakness but otherwise nonfocal  Assessment and Plan- Patient is a 66 y.o. 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  Bone marrow biopsy on January 24, 2021 revealed complete remission.  Patient underwent stem cell transplant on February 24, 2021.  She was hospitalized in January with neutropenic fever but negative infectious disease work-up.  Patient is now on consolidation treatment with the GRIFFIN regimen.   URI -we will start antibiotics with Augmentin x10 days.  Discussed symptomatic care.  We will give IV fluids today.  Discussed ER precautions.  Patient has follow-up with [facility] later this week.  Also recommended COVID testing.  Plan discussed with Dr. Grayland Ormond   Patient expressed understanding and was in agreement with this plan. She also understands that She can call clinic at any time with any questions, concerns, or complaints.   Thank you for allowing me to participate in the care of this very pleasant patient.   Time Total: 15 minutes  Visit consisted of counseling and education dealing with the complex and emotionally intense issues of symptom management in the setting of serious illness.Greater than 50%  of this time was spent counseling and coordinating care related to the above  assessment and plan.  Signed by: Altha Harm, PhD, NP-C

## 2021-07-19 NOTE — Patient Instructions (Signed)
Rehydration, Adult Rehydration is the replacement of body fluids, salts, and minerals (electrolytes) that are lost during dehydration. Dehydration is when there is not enough water or other fluids in the body. This happens when you lose more fluids than you take in. Common causes of dehydration include: Not drinking enough fluids. This can occur when you are ill or doing activities that require a lot of energy, especially in hot weather. Conditions that cause loss of water or other fluids, such as diarrhea, vomiting, sweating, or urinating a lot. Other illnesses, such as fever or infection. Certain medicines, such as those that remove excess fluid from the body (diuretics). Symptoms of mild or moderate dehydration may include thirst, dry lips and mouth, and dizziness. Symptoms of severe dehydration may include increased heart rate, confusion, fainting, and not urinating. For severe dehydration, you may need to get fluids through an IV at the hospital. For mild or moderate dehydration, you can usually rehydrate at home by drinking certain fluids as told by your health care provider. What are the risks? Generally, rehydration is safe. However, taking in too much fluid (overhydration) can be a problem. This is rare. Overhydration can cause an electrolyte imbalance, kidney failure, or a decrease in salt (sodium) levels in the body. Supplies needed You will need an oral rehydration solution (ORS) if your health care provider tells you to use one. This is a drink to treat dehydration. It can be found in pharmacies and retail stores. How to rehydrate Fluids Follow instructions from your health care provider for rehydration. The kind of fluid and the amount you should drink depend on your condition. In general, you should choose drinks that you prefer. If told by your health care provider, drink an ORS. Make an ORS by following instructions on the package. Start by drinking small amounts, about  cup (120  mL) every 5-10 minutes. Slowly increase how much you drink until you have taken the amount recommended by your health care provider. Drink enough clear fluids to keep your urine pale yellow. If you were told to drink an ORS, finish it first, then start slowly drinking other clear fluids. Drink fluids such as: Water. This includes sparkling water and flavored water. Drinking only water can lead to having too little sodium in your body (hyponatremia). Follow the advice of your health care provider. Water from ice chips you suck on. Fruit juice with water you add to it (diluted). Sports drinks. Hot or cold herbal teas. Broth-based soups. Milk or milk products. Food Follow instructions from your health care provider about what to eat while you rehydrate. Your health care provider may recommend that you slowly begin eating regular foods in small amounts. Eat foods that contain a healthy balance of electrolytes, such as bananas, oranges, potatoes, tomatoes, and spinach. Avoid foods that are greasy or contain a lot of sugar. In some cases, you may get nutrition through a feeding tube that is passed through your nose and into your stomach (nasogastric tube, or NG tube). This may be done if you have uncontrolled vomiting or diarrhea. Beverages to avoid  Certain beverages may make dehydration worse. While you rehydrate, avoid drinking alcohol. How to tell if you are recovering from dehydration You may be recovering from dehydration if: You are urinating more often than before you started rehydrating. Your urine is pale yellow. Your energy level improves. You vomit less frequently. You have diarrhea less frequently. Your appetite improves or returns to normal. You feel less dizzy or less light-headed.   Your skin tone and color start to look more normal. Follow these instructions at home: Take over-the-counter and prescription medicines only as told by your health care provider. Do not take sodium  tablets. Doing this can lead to having too much sodium in your body (hypernatremia). Contact a health care provider if: You continue to have symptoms of mild or moderate dehydration, such as: Thirst. Dry lips. Slightly dry mouth. Dizziness. Dark urine or less urine than normal. Muscle cramps. You continue to vomit or have diarrhea. Get help right away if you: Have symptoms of dehydration that get worse. Have a fever. Have a severe headache. Have been vomiting and the following happens: Your vomiting gets worse or does not go away. Your vomit includes blood or green matter (bile). You cannot eat or drink without vomiting. Have problems with urination or bowel movements, such as: Diarrhea that gets worse or does not go away. Blood in your stool (feces). This may cause stool to look black and tarry. Not urinating, or urinating only a small amount of very dark urine, within 6-8 hours. Have trouble breathing. Have symptoms that get worse with treatment. These symptoms may represent a serious problem that is an emergency. Do not wait to see if the symptoms will go away. Get medical help right away. Call your local emergency services (911 in the [place] Do not drive yourself to the hospital. Summary Rehydration is the replacement of body fluids and minerals (electrolytes) that are lost during dehydration. Follow instructions from your health care provider for rehydration. The kind of fluid and amount you should drink depend on your condition. Slowly increase how much you drink until you have taken the amount recommended by your health care provider. Contact your health care provider if you continue to show signs of mild or moderate dehydration. This information is not intended to replace advice given to you by your health care provider. Make sure you discuss any questions you have with your health care provider. Document Revised: 04/16/2019 Document Reviewed: 02/24/2019 Elsevier Patient  Education  2023 [employer] Inc.  

## 2021-07-19 NOTE — Progress Notes (Signed)
Pt here for D8C6 Velcade. She reports 5 days of upper respiratory symptoms including, sinus pain/pressure, congestion, sneezing, and coughing with phlegm production. Dr. Grayland Ormond and [facility] notified, and pt will be seen as an add-on to [facility]. Labs reported to MD, and due to AST/ALT/PLT out of treatment parameters, treatment will be held today. 1L NS to be given.

## 2021-07-20 ENCOUNTER — Other Ambulatory Visit: Payer: Self-pay | Admitting: Internal Medicine

## 2021-07-20 DIAGNOSIS — Z1231 Encounter for screening mammogram for malignant neoplasm of breast: Secondary | ICD-10-CM

## 2021-07-21 NOTE — Telephone Encounter (Signed)
Per you last note for patient-  Patient will then return to clinic in 3 weeks for further evaluation and consideration of cycle 7 which will be daratumumab and Revlimid only.  Do you want to go ahead and send the refill for Revlimid or wait until her next visit with you on 6/6?

## 2021-07-22 ENCOUNTER — Inpatient Hospital Stay: Payer: Medicare PPO

## 2021-07-22 ENCOUNTER — Ambulatory Visit: Payer: Medicare PPO

## 2021-07-22 VITALS — BP 101/61 | HR 83 | Temp 96.0°F | Resp 18

## 2021-07-22 DIAGNOSIS — Z5112 Encounter for antineoplastic immunotherapy: Secondary | ICD-10-CM | POA: Diagnosis not present

## 2021-07-22 DIAGNOSIS — C9000 Multiple myeloma not having achieved remission: Secondary | ICD-10-CM

## 2021-07-22 LAB — CBC WITH DIFFERENTIAL/PLATELET
Abs Immature Granulocytes: 0.02 10*3/uL (ref 0.00–0.07)
Basophils Absolute: 0 10*3/uL (ref 0.0–0.1)
Basophils Relative: 1 %
Eosinophils Absolute: 0.3 10*3/uL (ref 0.0–0.5)
Eosinophils Relative: 6 %
HCT: 32.3 % — ABNORMAL LOW (ref 36.0–46.0)
Hemoglobin: 10.5 g/dL — ABNORMAL LOW (ref 12.0–15.0)
Immature Granulocytes: 0 %
Lymphocytes Relative: 8 %
Lymphs Abs: 0.4 10*3/uL — ABNORMAL LOW (ref 0.7–4.0)
MCH: 32.1 pg (ref 26.0–34.0)
MCHC: 32.5 g/dL (ref 30.0–36.0)
MCV: 98.8 fL (ref 80.0–100.0)
Monocytes Absolute: 0.6 10*3/uL (ref 0.1–1.0)
Monocytes Relative: 14 %
Neutro Abs: 3.4 10*3/uL (ref 1.7–7.7)
Neutrophils Relative %: 71 %
Platelets: 103 10*3/uL — ABNORMAL LOW (ref 150–400)
RBC: 3.27 MIL/uL — ABNORMAL LOW (ref 3.87–5.11)
RDW: 12.8 % (ref 11.5–15.5)
WBC: 4.7 10*3/uL (ref 4.0–10.5)
nRBC: 0 % (ref 0.0–0.2)

## 2021-07-22 LAB — COMPREHENSIVE METABOLIC PANEL
ALT: 162 U/L — ABNORMAL HIGH (ref 0–44)
AST: 55 U/L — ABNORMAL HIGH (ref 15–41)
Albumin: 3.5 g/dL (ref 3.5–5.0)
Alkaline Phosphatase: 126 U/L (ref 38–126)
Anion gap: 7 (ref 5–15)
BUN: 13 mg/dL (ref 8–23)
CO2: 23 mmol/L (ref 22–32)
Calcium: 8.3 mg/dL — ABNORMAL LOW (ref 8.9–10.3)
Chloride: 111 mmol/L (ref 98–111)
Creatinine, Ser: 1.08 mg/dL — ABNORMAL HIGH (ref 0.44–1.00)
GFR, Estimated: 57 mL/min — ABNORMAL LOW (ref >60)
Glucose, Bld: 136 mg/dL — ABNORMAL HIGH (ref 70–99)
Potassium: 3.8 mmol/L (ref 3.5–5.1)
Sodium: 141 mmol/L (ref 135–145)
Total Bilirubin: 0.6 mg/dL (ref 0.3–1.2)
Total Protein: 5.9 g/dL — ABNORMAL LOW (ref 6.5–8.1)

## 2021-07-22 MED ORDER — HEPARIN SOD (PORK) LOCK FLUSH 100 UNIT/ML IV SOLN
500.0000 [IU] | Freq: Once | INTRAVENOUS | Status: AC
  Administered 2021-07-22: 500 [IU] via INTRAVENOUS
  Filled 2021-07-22: qty 5

## 2021-07-22 MED ORDER — PROCHLORPERAZINE MALEATE 10 MG PO TABS: 10.0000 mg | ORAL_TABLET | Freq: Once | ORAL | Status: DC

## 2021-07-22 MED ORDER — BORTEZOMIB CHEMO SQ INJECTION 3.5 MG (2.5MG/ML)
1.3000 mg/m2 | Freq: Once | INTRAMUSCULAR | Status: AC
  Administered 2021-07-22: 2 mg via SUBCUTANEOUS
  Filled 2021-07-22: qty 0.8

## 2021-07-22 NOTE — Progress Notes (Signed)
Per. Dr. Conchita Paris with velcade treatment today.  He is aware of lab results.

## 2021-07-22 NOTE — Patient Instructions (Signed)
[facility] [place]  Discharge Instructions: °Thank you for choosing [facility] to provide your oncology and hematology care.  ° °If you have a lab appointment with the Cancer Center, please go directly to the Cancer Center and check in at the registration area. °  °Wear comfortable clothing and clothing appropriate for easy access to any Portacath or PICC line.  ° °We strive to give you quality time with your provider. You may need to reschedule your appointment if you arrive late (15 or more minutes).  Arriving late affects you and other patients whose appointments are after yours.  Also, if you miss three or more appointments without notifying the office, you may be dismissed from the clinic at the provider’s discretion.    °  °For prescription refill requests, have your pharmacy contact our office and allow 72 hours for refills to be completed.   ° °Today you received the following chemotherapy and/or immunotherapy agents     °  °To help prevent nausea and vomiting after your treatment, we encourage you to take your nausea medication as directed. ° °BELOW ARE SYMPTOMS THAT SHOULD BE REPORTED IMMEDIATELY: °*FEVER GREATER THAN 100.4 F (38 °C) OR HIGHER °*CHILLS OR SWEATING °*NAUSEA AND VOMITING THAT IS NOT CONTROLLED WITH YOUR NAUSEA MEDICATION °*UNUSUAL SHORTNESS OF BREATH °*UNUSUAL BRUISING OR BLEEDING °*URINARY PROBLEMS (pain or burning when urinating, or frequent urination) °*BOWEL PROBLEMS (unusual diarrhea, constipation, pain near the anus) °TENDERNESS IN MOUTH AND THROAT WITH OR WITHOUT PRESENCE OF ULCERS (sore throat, sores in mouth, or a toothache) °UNUSUAL RASH, SWELLING OR PAIN  °UNUSUAL VAGINAL DISCHARGE OR ITCHING  ° °Items with * indicate a potential emergency and should be followed up as soon as possible or go to the Emergency Department if any problems should occur. ° °Please show the CHEMOTHERAPY ALERT CARD or IMMUNOTHERAPY ALERT CARD at check-in to the  Emergency Department and triage nurse. ° °Should you have questions after your visit or need to cancel or reschedule your appointment, please contact [facility]  Dept: [phone]  and follow the prompts.  Office hours are 8:00 a.m. to 4:30 p.m. Monday - Friday. Please note that voicemails left after 4:00 p.m. may not be returned until the following business day.  We are closed weekends and major holidays. You have access to a nurse at all times for urgent questions. Please call the main number to the clinic Dept: [phone] and follow the prompts. ° ° °For any non-urgent questions, you may also contact your provider using MyChart. We now offer e-Visits for anyone 18 and older to request care online for non-urgent symptoms. For details visit [email or web address]. °  °Also download the MyChart app! Go to the app store, search "MyChart", open the app, select [facility], and log in with your MyChart username and password. ° °Due to Covid, a mask is required upon entering the hospital/clinic. If you do not have a mask, one will be given to you upon arrival. For doctor visits, patients may have 1 support person aged 18 or older with them. For treatment visits, patients cannot have anyone with them due to current Covid guidelines and our immunocompromised population.  ° °

## 2021-07-26 ENCOUNTER — Other Ambulatory Visit: Payer: Self-pay

## 2021-07-26 DIAGNOSIS — C9000 Multiple myeloma not having achieved remission: Secondary | ICD-10-CM

## 2021-07-26 MED ORDER — LENALIDOMIDE 10 MG PO CAPS: 10.0000 mg | ORAL_CAPSULE | Freq: Every day | ORAL | 0 refills | Status: DC

## 2021-07-26 MED ORDER — LENALIDOMIDE 10 MG PO CAPS: ORAL_CAPSULE | ORAL | 0 refills | Status: DC

## 2021-08-01 NOTE — Progress Notes (Unsigned)
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  INTERVAL HISTORY: Patient returns to clinic today for further evaluation and consideration of cycle 7, day 1 of daratumumab and Revlimid.  She continues to have a mild peripheral neuropathy, but otherwise feels well.  She is tolerating her treatments without significant side effects.  She has no other neurologic complaints.  She has chronic back pain.  She denies any recent fevers or illnesses.  She has no chest pain, shortness of breath, cough, or hemoptysis.  She denies any nausea, vomiting, constipation, or diarrhea.  She has no urinary complaints.  Patient offers no further specific complaints today.  REVIEW OF SYSTEMS:   Review of Systems  Constitutional: Negative.  Negative for fever, malaise/fatigue and weight loss.  Respiratory: Negative.  Negative for cough, hemoptysis and shortness of breath.   Cardiovascular:  Negative for chest pain and leg swelling.  Gastrointestinal: Negative.  Negative for abdominal pain, diarrhea and nausea.  Genitourinary: Negative.  Negative for dysuria.  Musculoskeletal:  Positive for back pain. Negative for joint pain.  Skin: Negative.  Negative for rash.  Neurological:  Positive for sensory change. Negative for dizziness, tremors, focal weakness, weakness and headaches.  Psychiatric/Behavioral: Negative.  The patient is not nervous/anxious.    As per HPI. Otherwise, a complete review of systems is negative.  PAST MEDICAL HISTORY: Past Medical History:  Diagnosis Date   Anxiety    Diabetes mellitus without complication ([place]) 5638   GERD (gastroesophageal reflux disease)    Hyperlipidemia     Myeloma ([place])    Personal history of colonic polyps    Squamous cell carcinoma of skin 11/25/2013   Left chest. WD SCC with superficial infiltration.   Tachycardia     PAST SURGICAL HISTORY: Past Surgical History:  Procedure Laterality Date   AUGMENTATION MAMMAPLASTY Bilateral 7564   silicone and replacement in 2001   [place], [place], 1998,2003, 2008, 2013   COLONOSCOPY WITH PROPOFOL N/A 08/16/2016   Procedure: COLONOSCOPY WITH PROPOFOL;  Surgeon: Robert Bellow, MD;  Location: [facility] ENDOSCOPY;  Service: Endoscopy;  Laterality: N/A;   DILATION AND CURETTAGE OF UTERUS     ENDOMETRIAL ABLATION  12/1991   ganglion cyst removal      IR IMAGING GUIDED PORT INSERTION  10/27/2020   IR IMAGING GUIDED PORT INSERTION  04/04/2021   KYPHOPLASTY N/A 10/28/2020   Procedure: T12 and L3 KYPHOPLASTY;  Surgeon: Hessie Knows, MD;  Location: [facility] ORS;  Service: Orthopedics;  Laterality: N/A;   TONSILLECTOMY     WRIST SURGERY Right 05/1995    FAMILY HISTORY: Family History  Problem Relation Age of Onset   Colon polyps Sister    Colon cancer Father 8   Diabetes Mother    Breast cancer Neg Hx     ADVANCED DIRECTIVES (Y/N):  N  HEALTH MAINTENANCE: Social History   Tobacco Use   Smoking status: Former    Packs/day: 1.00    Years: 4.00    Pack years: 4.00    Types: Cigarettes    Quit date: 1982    Years since quitting: 41.4   Smokeless tobacco: Never  Vaping Use  Vaping Use: Never used  Substance Use Topics   Alcohol use: Yes    Alcohol/week: 1.0 - 2.0 standard drink    Types: 1 - 2 Glasses of wine per week    Comment: occassionally   Drug use: No     Colonoscopy:  PAP:  Bone density:  Lipid panel:  No Known Allergies  Current Outpatient Medications  Medication Sig Dispense Refill   acetaminophen (TYLENOL) 325 MG tablet Take 650 mg by mouth 2 (two) times a week. Premed for velcade. Taking Tuesdays and  Friday     acyclovir (ZOVIRAX) 400 MG tablet TAKE 1 TABLET BY MOUTH TWICE DAILY 60 tablet 5   ASPIRIN 81 PO Take 81 mg by mouth daily.     atorvastatin (LIPITOR) 20 MG tablet Take 20 mg by mouth daily.     busPIRone (BUSPAR) 30 MG tablet Take 30 mg by mouth 2 (two) times daily.     CALCIUM-VITAMIN D PO Take by mouth 2 (two) times daily.     cyclobenzaprine (FLEXERIL) 10 MG tablet TAKE 1 TABLET BY MOUTH AT BEDTIME 30 tablet 2   dexamethasone (DECADRON) 4 MG tablet Take 5 tablets (20 mg total) by mouth once a week. 20 tablet 3   diphenhydrAMINE (BENADRYL) 25 mg capsule Take 25 mg by mouth 2 (two) times a week. Pre med for velcade. Only Tuesdays and Fridays     gabapentin (NEURONTIN) 300 MG capsule Take 300 mg by mouth at bedtime.     lenalidomide (REVLIMID) 10 MG capsule Take 1 capsule (10 mg total) by mouth daily. Take for 21 days, then hold for 7 days. Repeat every 28 days. 21 capsule 0   lidocaine-prilocaine (EMLA) cream APPLY 1 APPLICATION TOPICALLY AS NEEDED 30 g 2   liraglutide (VICTOZA) 18 MG/3ML SOPN Inject 1.2 mg into the skin daily.     Magnesium 400 MG TABS Take 1 tablet by mouth daily.     meloxicam (MOBIC) 15 MG tablet Take 15 mg by mouth daily.     methocarbamol (ROBAXIN) 500 MG tablet Take 1 tablet (500 mg total) by mouth every 6 (six) hours as needed for muscle spasms. 40 tablet 1   metoprolol succinate (TOPROL-XL) 50 MG 24 hr tablet Take 50 mg by mouth. Take 1.5 tabs in am. Take 1 tablet at HS.     Multiple Vitamin (MULTI-VITAMIN) tablet Take 1 tablet by mouth daily.     omeprazole (PRILOSEC) 40 MG capsule Take 40 mg by mouth daily.     ondansetron (ZOFRAN) 8 MG tablet Take 1 tablet (8 mg total) by mouth 2 (two) times daily as needed (Nausea or vomiting). 60 tablet 2   oxyCODONE-acetaminophen (PERCOCET/ROXICET) 5-325 MG tablet TAKE 1-2 TABLETS BY MOUTH EVERY 4 HOURS AS NEEDED FOR SEVERE PAIN 90 tablet 0   prochlorperazine (COMPAZINE) 10 MG tablet TAKE 1 TABLET BY MOUTH EVERY 6  HOURS AS NEEDED FOR NAUSEA OR VOMITING 60 tablet 2   traMADol (ULTRAM) 50 MG tablet Take 1 tablet (50 mg total) by mouth every 6 (six) hours as needed. 60 tablet 1   ALPRAZolam (XANAX) 0.25 MG tablet Take 1 tablet (0.25 mg total) by mouth at bedtime as needed for sleep. 30 tablet 0   diphenoxylate-atropine (LOMOTIL) 2.5-0.025 MG tablet Take by mouth as needed. (Patient not taking: Reported on 08/02/2021)     morphine (MS CONTIN) 15 MG 12 hr tablet TAKE 2 TABLETS BY MOUTH EVERY 12 HOURS (Patient not taking: Reported on 08/02/2021) 120 tablet 0  No current facility-administered medications for this visit.   Facility-Administered Medications Ordered in Other Visits  Medication Dose Route Frequency Provider Last Rate Last Admin   0.9 %  sodium chloride infusion   Intravenous Continuous Lloyd Huger, MD   Stopped at 04/07/21 1142   sodium chloride flush (NS) 0.9 % injection 10 mL  10 mL Intravenous PRN Lloyd Huger, MD   10 mL at 11/23/20 0932   sodium chloride flush (NS) 0.9 % injection 10 mL  10 mL Intracatheter PRN Lloyd Huger, MD   10 mL at 08/02/21 1141    OBJECTIVE: Vitals:   08/02/21 0930  BP: 91/65  Pulse: 68  Resp: 14  Temp: (!) 97.3 F (36.3 C)  SpO2: 100%     Body mass index is 21.03 kg/m.    ECOG FS:0 - Asymptomatic  General: Well-developed, well-nourished, no acute distress. Eyes: Pink conjunctiva, anicteric sclera. HEENT: Normocephalic, moist mucous membranes. Lungs: No audible wheezing or coughing. Heart: Regular rate and rhythm. Abdomen: Soft, nontender, no obvious distention. Musculoskeletal: No edema, cyanosis, or clubbing. Neuro: Alert, answering all questions appropriately. Cranial nerves grossly intact. Skin: No rashes or petechiae noted. Psych: Normal affect.  LAB RESULTS:  Lab Results  Component Value Date   NA 138 08/02/2021   K 4.4 08/02/2021   CL 105 08/02/2021   CO2 24 08/02/2021   GLUCOSE 202 (H) 08/02/2021   BUN 19 08/02/2021    CREATININE 1.20 (H) 08/02/2021   CALCIUM 8.9 08/02/2021   PROT 6.6 08/02/2021   ALBUMIN 3.9 08/02/2021   AST 49 (H) 08/02/2021   ALT 96 (H) 08/02/2021   ALKPHOS 127 (H) 08/02/2021   BILITOT 0.5 08/02/2021   GFRNONAA 50 (L) 08/02/2021    Lab Results  Component Value Date   WBC 5.0 08/02/2021   NEUTROABS 4.1 08/02/2021   HGB 11.7 (L) 08/02/2021   HCT 36.3 08/02/2021   MCV 98.4 08/02/2021   PLT 147 (L) 08/02/2021     STUDIES: No results found.  ONCOLOGY HISTORY:  Diagnosis confirmed from bone biopsy on August 17, 2020.  Initially her lambda free light chains were elevated at 432.1, but now are within normal limits at 12.1.  Immunoglobulins and SPEP are normal.  Bone marrow biopsy completed on August 26, 2020 revealed 25% plasma cells along with the deletion of 1p which is considered poor prognosis.  PET scan results from September 06, 2020 reviewed independently with 3 hypermetabolic lesions in right C5-6 facets, right iliac crest lesion, and L3 vertebral lesion.  After lengthy discussion with the patient, she agreed to pursue chemotherapy with daratumumab, Velcade, Revlimid, and dexamethasone followed by autologous bone marrow transplant.  Patient received weekly daratumumab for 4 cycles along with Velcade on days 1, 4, 8, and 11.  She also received Revlimid on days 1 through 14 of a 21-day cycle.  She completed cycle 4 of treatment on January 14, 2021.  Subsequent bone marrow biopsy on January 24, 2021 revealed complete remission with no evidence of myeloma.    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  PLAN:      1.  Lambda light chain myeloma with 1p del: Patient underwent autologous stem cell transplant at [facility] on February 24, 2021 with Dr. Alvie Heidelberg.  She was readmitted to the hospital on March 04, 2021 with neutropenic fever, diarrhea, and sepsis-like symptoms.  Infectious disease work-up remains negative.  Her blood counts have  essentially resolved.  Patient had  consultation yesterday with [facility] transplant team who have recommended consolidation using the GRIFFIN regimen.  Patient will receive consolidation with cycle 5 and 6 using daratumumab on day 1, Velcade on day 1, 4, 8, and 11, Revlimid 10 mg on days 1 through 14 with 7 days off, and weekly dexamethasone on a 21-day cycle.  Patient will then receive maintenance with daratumumab on day 1 and Revlimid 10 mg on days 1 through 21 on a 28-day cycle for cycles 7 through 32.  Proceed with cycle 7 today which is daratumumab and Revlimid only.  Patient takes all of her premedications at home prior to clinic, therefore these have been removed from the treatment plan.  Patient will also receive Zometa today.  Return to clinic in 4 weeks for further evaluation and continuation of treatment.  Appreciate clinical pharmacy input. 2.  Pain: She has completed XRT.  She had kyphoplasty x2.  Continue Percocet and tramadol as needed.   3.  Hypercalcemia: Resolved.  Zometa as above.   4.  Renal insufficiency: Mild, monitor.  Patient's creatinine is 1.20. 5.  Anemia: Hemoglobin has improved to 11.7. 6.  Thrombocytopenia: Nearly resolved. 7.  Vaccinations: Continue revaccination schedule as per [facility]. 8.  Transaminitis: Mild, proceed with treatment as above.  Patient expressed understanding and was in agreement with this plan. She also understands that She can call clinic at any time with any questions, concerns, or complaints.      Cancer Staging  Lambda light chain myeloma ([place]) Staging form: Plasma Cell Myeloma and Plasma Cell Disorders, AJCC 8th Edition - Clinical stage from 09/16/2020: RISS Stage I (Beta-2-microglobulin (mg/L): 2.5, Albumin (g/dL): 4.9, ISS: Stage I, High-risk cytogenetics: Absent, LDH: Normal) - Signed by Lloyd Huger, MD on 09/16/2020 Beta 2 microglobulin range (mg/L): Less than 3.5 Albumin range (g/dL): Greater than or equal to  3.5 Cytogenetics: 1p deletion  Lloyd Huger, MD   08/02/2021 12:43 PM

## 2021-08-02 ENCOUNTER — Other Ambulatory Visit: Payer: Self-pay

## 2021-08-02 ENCOUNTER — Inpatient Hospital Stay: Payer: Medicare PPO | Attending: Oncology

## 2021-08-02 ENCOUNTER — Encounter: Payer: Self-pay | Admitting: Oncology

## 2021-08-02 ENCOUNTER — Inpatient Hospital Stay (HOSPITAL_BASED_OUTPATIENT_CLINIC_OR_DEPARTMENT_OTHER): Payer: Medicare PPO | Admitting: Oncology

## 2021-08-02 ENCOUNTER — Inpatient Hospital Stay: Payer: Medicare PPO

## 2021-08-02 VITALS — BP 91/65 | HR 68 | Temp 97.3°F | Resp 14 | Wt 115.0 lb

## 2021-08-02 DIAGNOSIS — C9000 Multiple myeloma not having achieved remission: Secondary | ICD-10-CM

## 2021-08-02 DIAGNOSIS — Z9484 Stem cells transplant status: Secondary | ICD-10-CM | POA: Diagnosis not present

## 2021-08-02 DIAGNOSIS — Z923 Personal history of irradiation: Secondary | ICD-10-CM | POA: Diagnosis not present

## 2021-08-02 DIAGNOSIS — Z5112 Encounter for antineoplastic immunotherapy: Secondary | ICD-10-CM | POA: Diagnosis present

## 2021-08-02 DIAGNOSIS — C9001 Multiple myeloma in remission: Secondary | ICD-10-CM | POA: Diagnosis present

## 2021-08-02 DIAGNOSIS — Z79899 Other long term (current) drug therapy: Secondary | ICD-10-CM | POA: Diagnosis not present

## 2021-08-02 DIAGNOSIS — D649 Anemia, unspecified: Secondary | ICD-10-CM | POA: Insufficient documentation

## 2021-08-02 LAB — CBC WITH DIFFERENTIAL/PLATELET
Abs Immature Granulocytes: 0.01 10*3/uL (ref 0.00–0.07)
Basophils Absolute: 0.1 10*3/uL (ref 0.0–0.1)
Basophils Relative: 1 %
Eosinophils Absolute: 0.3 10*3/uL (ref 0.0–0.5)
Eosinophils Relative: 5 %
HCT: 36.3 % (ref 36.0–46.0)
Hemoglobin: 11.7 g/dL — ABNORMAL LOW (ref 12.0–15.0)
Immature Granulocytes: 0 %
Lymphocytes Relative: 3 %
Lymphs Abs: 0.2 10*3/uL — ABNORMAL LOW (ref 0.7–4.0)
MCH: 31.7 pg (ref 26.0–34.0)
MCHC: 32.2 g/dL (ref 30.0–36.0)
MCV: 98.4 fL (ref 80.0–100.0)
Monocytes Absolute: 0.4 10*3/uL (ref 0.1–1.0)
Monocytes Relative: 9 %
Neutro Abs: 4.1 10*3/uL (ref 1.7–7.7)
Neutrophils Relative %: 82 %
Platelets: 147 10*3/uL — ABNORMAL LOW (ref 150–400)
RBC: 3.69 MIL/uL — ABNORMAL LOW (ref 3.87–5.11)
RDW: 13.5 % (ref 11.5–15.5)
WBC: 5 10*3/uL (ref 4.0–10.5)
nRBC: 0 % (ref 0.0–0.2)

## 2021-08-02 LAB — COMPREHENSIVE METABOLIC PANEL
ALT: 96 U/L — ABNORMAL HIGH (ref 0–44)
AST: 49 U/L — ABNORMAL HIGH (ref 15–41)
Albumin: 3.9 g/dL (ref 3.5–5.0)
Alkaline Phosphatase: 127 U/L — ABNORMAL HIGH (ref 38–126)
Anion gap: 9 (ref 5–15)
BUN: 19 mg/dL (ref 8–23)
CO2: 24 mmol/L (ref 22–32)
Calcium: 8.9 mg/dL (ref 8.9–10.3)
Chloride: 105 mmol/L (ref 98–111)
Creatinine, Ser: 1.2 mg/dL — ABNORMAL HIGH (ref 0.44–1.00)
GFR, Estimated: 50 mL/min — ABNORMAL LOW (ref >60)
Glucose, Bld: 202 mg/dL — ABNORMAL HIGH (ref 70–99)
Potassium: 4.4 mmol/L (ref 3.5–5.1)
Sodium: 138 mmol/L (ref 135–145)
Total Bilirubin: 0.5 mg/dL (ref 0.3–1.2)
Total Protein: 6.6 g/dL (ref 6.5–8.1)

## 2021-08-02 MED ORDER — SODIUM CHLORIDE 0.9% FLUSH
10.0000 mL | INTRAVENOUS | Status: DC | PRN
  Administered 2021-08-02: 10 mL
  Filled 2021-08-02: qty 10

## 2021-08-02 MED ORDER — ALPRAZOLAM 0.25 MG PO TABS: 0.2500 mg | ORAL_TABLET | Freq: Every evening | ORAL | 0 refills | Status: DC | PRN

## 2021-08-02 MED ORDER — HEPARIN SOD (PORK) LOCK FLUSH 100 UNIT/ML IV SOLN
500.0000 [IU] | Freq: Once | INTRAVENOUS | Status: AC | PRN
  Administered 2021-08-02: 500 [IU]
  Filled 2021-08-02: qty 5

## 2021-08-02 MED ORDER — DARATUMUMAB-HYALURONIDASE-FIHJ 1800-30000 MG-UT/15ML ~~LOC~~ SOLN
1800.0000 mg | Freq: Once | SUBCUTANEOUS | Status: AC
  Administered 2021-08-02: 1800 mg via SUBCUTANEOUS
  Filled 2021-08-02: qty 15

## 2021-08-02 NOTE — Patient Instructions (Signed)
[facility] CANCER CTR AT [place]  Discharge Instructions: Thank you for choosing [facility] to provide your oncology and hematology care.  If you have a lab appointment with the [facility], please go directly to the [facility] and check in at the registration area.  Wear comfortable clothing and clothing appropriate for easy access to any Portacath or PICC line.   We strive to give you quality time with your provider. You may need to reschedule your appointment if you arrive late (15 or more minutes).  Arriving late affects you and other patients whose appointments are after yours.  Also, if you miss three or more appointments without notifying the office, you may be dismissed from the clinic at the provider's discretion.      For prescription refill requests, have your pharmacy contact our office and allow 72 hours for refills to be completed.    Today you received the following chemotherapy and/or immunotherapy agents Darzalex      To help prevent nausea and vomiting after your treatment, we encourage you to take your nausea medication as directed.  BELOW ARE SYMPTOMS THAT SHOULD BE REPORTED IMMEDIATELY: *FEVER GREATER THAN 100.4 F (38 C) OR HIGHER *CHILLS OR SWEATING *NAUSEA AND VOMITING THAT IS NOT CONTROLLED WITH YOUR NAUSEA MEDICATION *UNUSUAL SHORTNESS OF BREATH *UNUSUAL BRUISING OR BLEEDING *URINARY PROBLEMS (pain or burning when urinating, or frequent urination) *BOWEL PROBLEMS (unusual diarrhea, constipation, pain near the anus) TENDERNESS IN MOUTH AND THROAT WITH OR WITHOUT PRESENCE OF ULCERS (sore throat, sores in mouth, or a toothache) UNUSUAL RASH, SWELLING OR PAIN  UNUSUAL VAGINAL DISCHARGE OR ITCHING   Items with * indicate a potential emergency and should be followed up as soon as possible or go to the Emergency Department if any problems should occur.  Please show the CHEMOTHERAPY ALERT CARD or IMMUNOTHERAPY ALERT CARD at check-in to  the Emergency Department and triage nurse.  Should you have questions after your visit or need to cancel or reschedule your appointment, please contact [facility] CANCER [place] AT [place]  [phone] and follow the prompts.  Office hours are 8:00 a.m. to 4:30 p.m. Monday - Friday. Please note that voicemails left after 4:00 p.m. may not be returned until the following business day.  We are closed weekends and major holidays. You have access to a nurse at all times for urgent questions. Please call the main number to the clinic [phone] and follow the prompts.  For any non-urgent questions, you may also contact your provider using MyChart. We now offer e-Visits for anyone 70 and older to request care online for non-urgent symptoms. For details visit [email or web address].   Also download the MyChart app! Go to the app store, search "MyChart", open the app, select , and log in with your MyChart username and password.  Due to Covid, a mask is required upon entering the hospital/clinic. If you do not have a mask, one will be given to you upon arrival. For doctor visits, patients may have 1 support person aged 45 or older with them. For treatment visits, patients cannot have anyone with them due to current Covid guidelines and our immunocompromised population.

## 2021-08-02 NOTE — Progress Notes (Signed)
ALT 96. Per Dr. Grayland Ormond, okay to proceed with Daratumumab treatment.   Patient took Tylenol, Benadryl, and Decadron at 0730 this AM.   1155: Patient monitored x 15 minutes post Darzalex injection. Patient tolerated treatment well.

## 2021-08-03 ENCOUNTER — Other Ambulatory Visit: Payer: Self-pay | Admitting: General Surgery

## 2021-08-03 LAB — KAPPA/LAMBDA LIGHT CHAINS
Kappa free light chain: 9 mg/L (ref 3.3–19.4)
Kappa, lambda light chain ratio: 2.09 — ABNORMAL HIGH (ref 0.26–1.65)
Lambda free light chains: 4.3 mg/L — ABNORMAL LOW (ref 5.7–26.3)

## 2021-08-03 MED ORDER — SODIUM CHLORIDE 0.9 % IV SOLN: 2.0000 g | Freq: Once | INTRAVENOUS | Status: DC

## 2021-08-03 NOTE — Progress Notes (Signed)
Subjective:     Patient ID: Hannah Mcdonald is a 66 y.o. female.   HPI   The following portions of the patient's history were reviewed and updated as appropriate.   This a new patient is here today for: office visit.here to discuss having a colonoscopy, last completed in 2018. She denies any rectal bleeding. She states her bowels move daily.   The patient's father had colon cancer in his before age 27, dying at age 13.   Since her last exam in June 2018 she has been treated for multiple myeloma, undergoing a bone marrow transplant in December 2022 at [facility].  She is done very well and is on a maintenance immunosuppression therapy under the care of Dr. Grayland Ormond.   Review of Systems Constitutional: Negative for chills and fever. Respiratory: Negative for cough.          Chief Complaint  Patient presents with   New Patient   Colon Cancer Screening      BP 118/76   Pulse 88   Temp 36.2 C (97.2 F) (Oral)   Ht 149.9 cm (_0 )   Wt 53.1 kg (117 lb)   SpO2 98%   BMI 23.63 kg/m        Past Medical History:  Diagnosis Date   Anxiety     GERD (gastroesophageal reflux disease)     Hyperlipidemia     Multiple myeloma myelomatosis (CMS-HCC)     Squamous cell carcinoma of skin 11/25/2013    left chest   Tachycardia     Type 2 diabetes mellitus (CMS-HCC)             Past Surgical History:  Procedure Laterality Date   breast enhancement Bilateral [place]   ENDOMETRIAL ABLATION W/ NOVASURE   12/1991   wrist surgery Right 05/1995   COLONOSCOPY   1998   COLONOSCOPY   2003   COLONOSCOPY   2008   EYELID SURGERY Bilateral 09/01/2015    4 lid bleph with full face laser skin resurfacing   COLONOSCOPY   08/16/2016   IR imadind guided port insertion   10/27/2020   KYPHOPLASTY   10/28/2020   IR imaging guided port insertion   04/04/2021   DILATION AND CURETTAGE, DIAGNOSTIC / THERAPEUTIC         uterus   ganglion cyst removal       TONSILLECTOMY                    OB History     Gravida  2   Para  2   Term      Preterm      AB      Living         SAB      IAB      Ectopic      Molar      Multiple      Live Births           Obstetric Comments  Age at first period 50 Age of first pregnancy 63             Social History           Socioeconomic History   Marital status: Married  Tobacco Use   Smoking status: Former      Packs/day: 1.00      Years: 4.00  Pack years: 4.00      Types: Cigarettes      Quit date: 02/28/1980      Years since quitting: 41.4   Smokeless tobacco: Never  Vaping Use   Vaping Use: Never used  Substance and Sexual Activity   Alcohol use: Never   Drug use: Never   Sexual activity: Defer    Social Determinants of Health       Transportation Needs: No Transportation Needs   Lack of Transportation (Medical): No   Lack of Transportation (Non-Medical): No        No Known Allergies   Current Medications        Current Outpatient Medications  Medication Sig Dispense Refill   acyclovir (ZOVIRAX) 400 MG tablet Take 400 mg by mouth 2 (two) times daily       atorvastatin (LIPITOR) 10 MG tablet Take 10 mg by mouth at bedtime       busPIRone (BUSPAR) 30 MG tablet Take 30 mg by mouth 2 (two) times daily       cyclobenzaprine (FLEXERIL) 10 MG tablet Take 1 tablet (10 mg total) by mouth at bedtime as needed for muscle spasms 30 tablet 0   diphenoxylate-atropine (LOMOTIL) 2.5-0.025 mg tablet Take 1 tablet by mouth 4 (four) times daily as needed for diarrhea for up to 10 days 14 tablet 0   gabapentin (NEURONTIN) 300 MG capsule Take 1 capsule (300 mg total) by mouth at bedtime 30 capsule 5   magnesium 200 mg Take 2 tablets by mouth once daily       meloxicam (MOBIC) 15 MG tablet         methocarbamoL (ROBAXIN) 500 MG tablet TAKE 1 TABLET BY MOUTH EVERY 6 HOURS AS NEEDED FOR MUSCLE SPASMS 40 tablet 1   metoprolol  succinate (TOPROL-XL) 50 MG XL tablet 75 mg once daily Take 75 mg in the morning, 25 mg at 2:00 pm and 50 mg nightly       morphine (MS CONTIN) 15 MG ER tablet Take 15 mg by mouth once daily 60 tablet 0   multivitamin tablet Take 1 tablet by mouth once daily       omeprazole (PRILOSEC) 40 MG DR capsule Take 1 capsule (40 mg total) by mouth once daily       ondansetron (ZOFRAN) 4 MG tablet Take 1 tablet (4 mg total) by mouth every 8 (eight) hours as needed for Nausea or Vomiting 90 tablet 2   oxyCODONE (ROXICODONE) 5 MG immediate release tablet Take 5 mg by mouth every 4 (four) hours as needed       oxyCODONE-acetaminophen (PERCOCET) 5-325 mg tablet Take 1 tablet by mouth every 6 (six) hours as needed       prochlorperazine (COMPAZINE) 10 MG tablet Take 10 mg by mouth every 6 (six) hours as needed for Nausea       traMADoL (ULTRAM) 50 mg tablet Take 50 mg by mouth at bedtime       VICTOZA 3-PAK 0.6 mg/0.1 mL (18 mg/3 mL) pen injector Inject 1.2 mg subcutaneously at bedtime        ALPRAZolam (XANAX) 0.25 MG tablet Take 0.25 mg by mouth as needed        No current facility-administered medications for this visit.             Family History  Problem Relation Age of Onset   Diabetes Mother     Asthma Mother     Colon cancer Father 58  Colon polyps Sister     Diabetes Brother     High blood pressure (Hypertension) Neg Hx     Glaucoma Neg Hx     Breast cancer Neg Hx          Labs and Radiology:      Jul 22, 2021 laboratory:   WBC 4.0 - 10.5 K/uL 4.7   RBC 3.87 - 5.11 MIL/uL 3.27 Low    Hemoglobin 12.0 - 15.0 g/dL 10.5 Low    HCT 36.0 - 46.0 % 32.3 Low    MCV 80.0 - 100.0 fL 98.8   MCH 26.0 - 34.0 pg 32.1   MCHC 30.0 - 36.0 g/dL 32.5   RDW 11.5 - 15.5 % 12.8   Platelets 150 - 400 K/uL 103 Low    nRBC 0.0 - 0.2 % 0.0   Neutrophils Relative % % 71   Neutro Abs 1.7 - 7.7 K/uL 3.4   Lymphocytes Relative % 8   Lymphs Abs 0.7 - 4.0 K/uL 0.4 Low    Monocytes Relative % 14    Monocytes Absolute 0.1 - 1.0 K/uL 0.6   Eosinophils Relative % 6   Eosinophils Absolute 0.0 - 0.5 K/uL 0.3   Basophils Relative % 1   Basophils Absolute 0.0 - 0.1 K/uL 0.0   Immature Granulocytes % 0   Abs Immature Granulocytes 0.00 - 0.07 K/uL 0.02     Sodium 135 - 145 mmol/L 141   Potassium 3.5 - 5.1 mmol/L 3.8   Chloride 98 - 111 mmol/L 111   CO2 22 - 32 mmol/L 23   Glucose, Bld 70 - 99 mg/dL 136 High    Comment: Glucose reference range applies only to samples taken after fasting for at least 8 hours.  BUN 8 - 23 mg/dL 13   Creatinine, Ser 0.44 - 1.00 mg/dL 1.08 High    Calcium 8.9 - 10.3 mg/dL 8.3 Low    Total Protein 6.5 - 8.1 g/dL 5.9 Low    Albumin 3.5 - 5.0 g/dL 3.5   AST 15 - 41 U/L 55 High    ALT 0 - 44 U/L 162 High    Alkaline Phosphatase 38 - 126 U/L 126   Total Bilirubin 0.3 - 1.2 mg/dL 0.6   GFR, Estimated >60 mL/min 57 Low        Colonoscopy August 16, 2016:   Normal exam.      Objective:   Physical Exam Constitutional:      Appearance: Normal appearance.  Cardiovascular:     Rate and Rhythm: Normal rate and regular rhythm.     Pulses: Normal pulses.     Heart sounds: Normal heart sounds.  Pulmonary:     Effort: Pulmonary effort is normal.     Breath sounds: Normal breath sounds.  Musculoskeletal:     Cervical back: Neck supple.  Skin:    General: Skin is warm and dry.  Neurological:     Mental Status: She is alert and oriented to person, place, and time.  Psychiatric:        Mood and Affect: Mood normal.        Behavior: Behavior normal.           Assessment:     Candidate for repeat colonoscopy based on family history.   Recent bone marrow transplant.    Plan:     Electronic communication with Dr. Grayland Ormond and he has no objections to her proceeding to colonoscopy.   In light of her immune  compromised, will make use of ampicillin IV prior to the procedure.   Patient will be contacted regarding scheduling next week by the staff.       This note is partially prepared by Karie Fetch, RN, acting as a scribe in the presence of Dr. Hervey Ard, MD.  The documentation recorded by the scribe accurately reflects the service I personally performed and the decisions made by me.    Robert Bellow, MD FACS

## 2021-08-10 ENCOUNTER — Other Ambulatory Visit: Payer: Self-pay | Admitting: Oncology

## 2021-08-16 ENCOUNTER — Other Ambulatory Visit: Payer: Self-pay | Admitting: Oncology

## 2021-08-16 DIAGNOSIS — C9000 Multiple myeloma not having achieved remission: Secondary | ICD-10-CM

## 2021-08-29 ENCOUNTER — Inpatient Hospital Stay: Payer: Medicare PPO

## 2021-08-29 ENCOUNTER — Inpatient Hospital Stay (HOSPITAL_BASED_OUTPATIENT_CLINIC_OR_DEPARTMENT_OTHER): Payer: Medicare PPO | Admitting: Hospice and Palliative Medicine

## 2021-08-29 ENCOUNTER — Inpatient Hospital Stay: Payer: Medicare PPO | Attending: Oncology

## 2021-08-29 ENCOUNTER — Telehealth: Payer: Self-pay

## 2021-08-29 VITALS — BP 121/74 | HR 84 | Temp 97.3°F | Resp 16 | Wt 121.0 lb

## 2021-08-29 DIAGNOSIS — C9000 Multiple myeloma not having achieved remission: Secondary | ICD-10-CM

## 2021-08-29 DIAGNOSIS — Z9484 Stem cells transplant status: Secondary | ICD-10-CM | POA: Diagnosis not present

## 2021-08-29 DIAGNOSIS — B9620 Unspecified Escherichia coli [E. coli] as the cause of diseases classified elsewhere: Secondary | ICD-10-CM | POA: Insufficient documentation

## 2021-08-29 DIAGNOSIS — N390 Urinary tract infection, site not specified: Secondary | ICD-10-CM

## 2021-08-29 DIAGNOSIS — C9001 Multiple myeloma in remission: Secondary | ICD-10-CM | POA: Diagnosis present

## 2021-08-29 DIAGNOSIS — R197 Diarrhea, unspecified: Secondary | ICD-10-CM | POA: Diagnosis not present

## 2021-08-29 DIAGNOSIS — Z79899 Other long term (current) drug therapy: Secondary | ICD-10-CM | POA: Diagnosis not present

## 2021-08-29 DIAGNOSIS — Z5112 Encounter for antineoplastic immunotherapy: Secondary | ICD-10-CM | POA: Insufficient documentation

## 2021-08-29 LAB — COMPREHENSIVE METABOLIC PANEL
ALT: 41 U/L (ref 0–44)
AST: 34 U/L (ref 15–41)
Albumin: 3.5 g/dL (ref 3.5–5.0)
Alkaline Phosphatase: 84 U/L (ref 38–126)
Anion gap: 6 (ref 5–15)
BUN: 15 mg/dL (ref 8–23)
CO2: 25 mmol/L (ref 22–32)
Calcium: 8.4 mg/dL — ABNORMAL LOW (ref 8.9–10.3)
Chloride: 108 mmol/L (ref 98–111)
Creatinine, Ser: 0.84 mg/dL (ref 0.44–1.00)
GFR, Estimated: >60 mL/min (ref >60)
Glucose, Bld: 133 mg/dL — ABNORMAL HIGH (ref 70–99)
Potassium: 4.1 mmol/L (ref 3.5–5.1)
Sodium: 139 mmol/L (ref 135–145)
Total Bilirubin: 0.4 mg/dL (ref 0.3–1.2)
Total Protein: 5.9 g/dL — ABNORMAL LOW (ref 6.5–8.1)

## 2021-08-29 LAB — CBC WITH DIFFERENTIAL/PLATELET
Abs Immature Granulocytes: 0.02 10*3/uL (ref 0.00–0.07)
Basophils Absolute: 0.1 10*3/uL (ref 0.0–0.1)
Basophils Relative: 2 %
Eosinophils Absolute: 0.3 10*3/uL (ref 0.0–0.5)
Eosinophils Relative: 9 %
HCT: 34.6 % — ABNORMAL LOW (ref 36.0–46.0)
Hemoglobin: 11.2 g/dL — ABNORMAL LOW (ref 12.0–15.0)
Immature Granulocytes: 1 %
Lymphocytes Relative: 24 %
Lymphs Abs: 0.8 10*3/uL (ref 0.7–4.0)
MCH: 32.1 pg (ref 26.0–34.0)
MCHC: 32.4 g/dL (ref 30.0–36.0)
MCV: 99.1 fL (ref 80.0–100.0)
Monocytes Absolute: 0.4 10*3/uL (ref 0.1–1.0)
Monocytes Relative: 11 %
Neutro Abs: 1.9 10*3/uL (ref 1.7–7.7)
Neutrophils Relative %: 53 %
Platelets: 154 10*3/uL (ref 150–400)
RBC: 3.49 MIL/uL — ABNORMAL LOW (ref 3.87–5.11)
RDW: 14.6 % (ref 11.5–15.5)
WBC: 3.4 10*3/uL — ABNORMAL LOW (ref 4.0–10.5)
nRBC: 0 % (ref 0.0–0.2)

## 2021-08-29 MED ORDER — SODIUM CHLORIDE 0.9 % IV SOLN
Freq: Once | INTRAVENOUS | Status: AC
  Filled 2021-08-29: qty 250

## 2021-08-29 MED ORDER — DARATUMUMAB-HYALURONIDASE-FIHJ 1800-30000 MG-UT/15ML ~~LOC~~ SOLN
1800.0000 mg | Freq: Once | SUBCUTANEOUS | Status: AC
  Administered 2021-08-29: 1800 mg via SUBCUTANEOUS
  Filled 2021-08-29: qty 15

## 2021-08-29 MED ORDER — ZOLEDRONIC ACID 4 MG/5ML IV CONC
3.3000 mg | Freq: Once | INTRAVENOUS | Status: AC
  Administered 2021-08-29: 3.3 mg via INTRAVENOUS
  Filled 2021-08-29: qty 4.13

## 2021-08-29 MED ORDER — HEPARIN SOD (PORK) LOCK FLUSH 100 UNIT/ML IV SOLN
500.0000 [IU] | Freq: Once | INTRAVENOUS | Status: AC | PRN
  Administered 2021-08-29: 500 [IU]
  Filled 2021-08-29: qty 5

## 2021-08-29 NOTE — Progress Notes (Signed)
Symptom Management [place] at [facility] [phone] [phone]  Patient Care Team: Albina Billet, MD as PCP - General (Internal Medicine) Bary Castilla Forest Gleason, MD as Consulting Physician (General Surgery)   Name of the patient: Hannah Mcdonald  [id number]  August 09, 1955   Date of visit: 08/29/21  Reason for Consult: Vada Swift is a 66 y.o. 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  Bone marrow biopsy on January 24, 2021 revealed complete remission.  Patient underwent stem cell transplant on February 24, 2021.  She was hospitalized in January with neutropenic fever but negative infectious disease work-up.  Patient is now on consolidation treatment with the GRIFFIN regimen.  Patient requested [facility] visit today to inform us that she has had recent recurrent UTI and reports that she is currently on Ceftin prescribed by her PCP.  Patient says that she is on her third course of Ceftin in the past 3 weeks.  She describes resolution of dysuria/cloudiness while on antibiotics but the symptoms returned shortly after discontinuation.  She denies a history of UTIs.  Denies any neurologic complaints. Denies recent fevers. Denies any easy bleeding or bruising. Reports good appetite and denies weight loss. Denies chest pain. Denies any nausea, vomiting, constipation.  She has had mild diarrhea on antibiotics. Patient offers no further specific complaints today.  PAST MEDICAL HISTORY: Past Medical History:  Diagnosis Date   Anxiety    Diabetes mellitus without complication ([place]) 6811   GERD (gastroesophageal reflux disease)    Hyperlipidemia    Myeloma ([place])    Personal history of colonic polyps    Squamous cell carcinoma of skin 11/25/2013   Left chest. WD SCC with superficial infiltration.   Tachycardia     PAST SURGICAL HISTORY:  Past Surgical History:   Procedure Laterality Date   AUGMENTATION MAMMAPLASTY Bilateral 5726   silicone and replacement in 2001   [place], [place], 1998,2003, 2008, 2013   COLONOSCOPY WITH PROPOFOL N/A 08/16/2016   Procedure: COLONOSCOPY WITH PROPOFOL;  Surgeon: Robert Bellow, MD;  Location: [facility] ENDOSCOPY;  Service: Endoscopy;  Laterality: N/A;   DILATION AND CURETTAGE OF UTERUS     ENDOMETRIAL ABLATION  12/1991   ganglion cyst removal      IR IMAGING GUIDED PORT INSERTION  10/27/2020   IR IMAGING GUIDED PORT INSERTION  04/04/2021   KYPHOPLASTY N/A 10/28/2020   Procedure: T12 and L3 KYPHOPLASTY;  Surgeon: Hessie Knows, MD;  Location: [facility] ORS;  Service: Orthopedics;  Laterality: N/A;   TONSILLECTOMY     WRIST SURGERY Right 05/1995    HEMATOLOGY/ONCOLOGY HISTORY:  Oncology History  Lambda light chain myeloma ([place])  08/24/2020 Initial Diagnosis   Lambda light chain myeloma ([place])   09/16/2020 Cancer Staging   Staging form: Plasma Cell Myeloma and Plasma Cell Disorders, AJCC 8th Edition - Clinical stage from 09/16/2020: RISS Stage I (Beta-2-microglobulin (mg/L): 2.5, Albumin (g/dL): 4.9, ISS: Stage I, High-risk cytogenetics: Absent, LDH: Normal) - Signed by Lloyd Huger, MD on 09/16/2020 Beta 2 microglobulin range (mg/L): Less than 3.5 Albumin range (g/dL): Greater than or equal to 3.5 Cytogenetics: 1p deletion   11/02/2020 - 07/22/2021 Chemotherapy   Patient is on Treatment Plan : POST TRANSPLANT DaraVRd (Daratumumab SQ) q21d x 2 Cycles (Consolidation)- cycle 5&6     08/02/2021 -  Chemotherapy   Patient is on Treatment  Plan : MYELOMA Daratumumab SQ q28d- Maintainance treatment w/ revlimid       ALLERGIES:  has No Known Allergies.  MEDICATIONS:  Current Outpatient Medications  Medication Sig Dispense Refill   acetaminophen (TYLENOL) 325 MG tablet Take 650 mg by mouth 2 (two) times a week. Premed for velcade. Taking Tuesdays and  Friday     acyclovir (ZOVIRAX) 400 MG tablet TAKE 1 TABLET BY MOUTH TWICE DAILY 60 tablet 5   ALPRAZolam (XANAX) 0.25 MG tablet TAKE 1 TABLET BY MOUTH AT BEDTIME AS NEEDED FOR SLEEP 30 tablet 2   ASPIRIN 81 PO Take 81 mg by mouth daily.     atorvastatin (LIPITOR) 20 MG tablet Take 20 mg by mouth daily.     busPIRone (BUSPAR) 30 MG tablet Take 30 mg by mouth 2 (two) times daily.     CALCIUM-VITAMIN D PO Take by mouth 2 (two) times daily.     cyclobenzaprine (FLEXERIL) 10 MG tablet TAKE 1 TABLET BY MOUTH AT BEDTIME 30 tablet 2   dexamethasone (DECADRON) 4 MG tablet Take 5 tablets (20 mg total) by mouth once a week. 20 tablet 3   diphenhydrAMINE (BENADRYL) 25 mg capsule Take 25 mg by mouth 2 (two) times a week. Pre med for velcade. Only Tuesdays and Fridays     diphenoxylate-atropine (LOMOTIL) 2.5-0.025 MG tablet Take by mouth as needed. (Patient not taking: Reported on 08/02/2021)     gabapentin (NEURONTIN) 300 MG capsule Take 300 mg by mouth at bedtime.     lenalidomide (REVLIMID) 10 MG capsule TAKE 1 CAPSULE BY MOUTH 1 TIME A DAY FOR 21 DAYS ON THEN 7 DAYS OFF 21 capsule 0   lidocaine-prilocaine (EMLA) cream APPLY 1 APPLICATION TOPICALLY AS NEEDED 30 g 2   liraglutide (VICTOZA) 18 MG/3ML SOPN Inject 1.2 mg into the skin daily.     Magnesium 400 MG TABS Take 1 tablet by mouth daily.     meloxicam (MOBIC) 15 MG tablet Take 15 mg by mouth daily.     methocarbamol (ROBAXIN) 500 MG tablet Take 1 tablet (500 mg total) by mouth every 6 (six) hours as needed for muscle spasms. 40 tablet 1   metoprolol succinate (TOPROL-XL) 50 MG 24 hr tablet Take 50 mg by mouth. Take 1.5 tabs in am. Take 1 tablet at HS.     morphine (MS CONTIN) 15 MG 12 hr tablet TAKE 2 TABLETS BY MOUTH EVERY 12 HOURS (Patient not taking: Reported on 08/02/2021) 120 tablet 0   Multiple Vitamin (MULTI-VITAMIN) tablet Take 1 tablet by mouth daily.     omeprazole (PRILOSEC) 40 MG capsule Take 40 mg by mouth daily.     ondansetron (ZOFRAN) 8  MG tablet Take 1 tablet (8 mg total) by mouth 2 (two) times daily as needed (Nausea or vomiting). 60 tablet 2   oxyCODONE-acetaminophen (PERCOCET/ROXICET) 5-325 MG tablet TAKE 1-2 TABLETS BY MOUTH EVERY 4 HOURS AS NEEDED FOR SEVERE PAIN 90 tablet 0   prochlorperazine (COMPAZINE) 10 MG tablet TAKE 1 TABLET BY MOUTH EVERY 6 HOURS AS NEEDED FOR NAUSEA OR VOMITING 60 tablet 2   traMADol (ULTRAM) 50 MG tablet Take 1 tablet (50 mg total) by mouth every 6 (six) hours as needed. 60 tablet 1   No current facility-administered medications for this visit.   Facility-Administered Medications Ordered in Other Visits  Medication Dose Route Frequency Provider Last Rate Last Admin   0.9 %  sodium chloride infusion   Intravenous Continuous Grayland Ormond, Kathlene November, MD   Stopped  at 04/07/21 1142   daratumumab-hyaluronidase-fihj (DARZALEX FASPRO) 1800-30000 MG-UT/15ML chemo SQ injection 1,800 mg  1,800 mg Subcutaneous Once Lloyd Huger, MD       heparin lock flush 100 unit/mL  500 Units Intracatheter Once PRN Lloyd Huger, MD       sodium chloride flush (NS) 0.9 % injection 10 mL  10 mL Intravenous PRN Lloyd Huger, MD   10 mL at 11/23/20 0932   zoledronic acid (ZOMETA) 3.3 mg in sodium chloride 0.9 % 100 mL IVPB  3.3 mg Intravenous Once Lloyd Huger, MD 416.5 mL/hr at 08/29/21 1001 3.3 mg at 08/29/21 1001    VITAL SIGNS: There were no vitals taken for this visit. There were no vitals filed for this visit.  Estimated body mass index is 22.14 kg/m as calculated from the following:   Height as of 07/12/21: _0  (1.575 m).   Weight as of an earlier encounter on 08/29/21: 121 lb 0.5 oz (54.9 kg).  LABS: CBC:    Component Value Date/Time   WBC 3.4 (L) 08/29/2021 0842   HGB 11.2 (L) 08/29/2021 0842   HCT 34.6 (L) 08/29/2021 0842   PLT 154 08/29/2021 0842   MCV 99.1 08/29/2021 0842   NEUTROABS 1.9 08/29/2021 0842   LYMPHSABS 0.8 08/29/2021 0842   MONOABS 0.4 08/29/2021 0842   EOSABS  0.3 08/29/2021 0842   BASOSABS 0.1 08/29/2021 0842   Comprehensive Metabolic Panel:    Component Value Date/Time   NA 139 08/29/2021 0842   K 4.1 08/29/2021 0842   CL 108 08/29/2021 0842   CO2 25 08/29/2021 0842   BUN 15 08/29/2021 0842   CREATININE 0.84 08/29/2021 0842   GLUCOSE 133 (H) 08/29/2021 0842   CALCIUM 8.4 (L) 08/29/2021 0842   AST 34 08/29/2021 0842   ALT 41 08/29/2021 0842   ALKPHOS 84 08/29/2021 0842   BILITOT 0.4 08/29/2021 0842   PROT 5.9 (L) 08/29/2021 0842   ALBUMIN 3.5 08/29/2021 0842    RADIOGRAPHIC STUDIES: No results found.  PERFORMANCE STATUS (ECOG) : 1 - Symptomatic but completely ambulatory  Review of Systems Unless otherwise noted, a complete review of systems is negative.  Physical Exam General: NAD HEENT: No exudate OP, no lymphadenopathy Cardiovascular: regular rate and rhythm Pulmonary: clear anterior/posterior fields Abdomen: soft, nontender, + bowel sounds GU: no suprapubic tenderness Extremities: no edema, no joint deformities Skin: no rashes Neurological: Weakness but otherwise nonfocal  Assessment and Plan- Patient is a 66 y.o. 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  Bone marrow biopsy on January 24, 2021 revealed complete remission.  Patient underwent stem cell transplant on February 24, 2021.  She was hospitalized in January with neutropenic fever but negative infectious disease work-up.  Patient is now on consolidation treatment with the GRIFFIN regimen.   UTI -patient is currently on a course of Ceftin prescribed by her PCP.  Patient showed me the lab report and Ceftin would be an appropriate antibiotic to treat her E. coli bacteriuria per culture and sensitivities.  We will recheck UA/culture.  Discussed possible option of referral to urology for further evaluation in the event of persistent or recurrent UTIs.  Otherwise, will likely defer to PCP  for further management.   Mild Diarrhea -likely secondary to antibiotic.  Recommended probiotics   Patient expressed understanding and was in agreement with this plan. She also understands that She can call clinic at any time with any questions,  concerns, or complaints.   Thank you for allowing me to participate in the care of this very pleasant patient.   Time Total: 15 minutes  Visit consisted of counseling and education dealing with the complex and emotionally intense issues of symptom management in the setting of serious illness.Greater than 50%  of this time was spent counseling and coordinating care related to the above assessment and plan.  Signed by: Altha Harm, PhD, NP-C

## 2021-08-29 NOTE — Telephone Encounter (Signed)
Patient in infusion c/o UTI symptoms, Pt says she's taken a round of antibiotics and she doesn't think it helped. NP aware, added to schedule

## 2021-08-29 NOTE — Patient Instructions (Signed)
[facility] CANCER CTR AT [place]  Discharge Instructions: Thank you for choosing [facility] to provide your oncology and hematology care.  If you have a lab appointment with the [place], please go directly to the [place] and check in at the registration area.  Wear comfortable clothing and clothing appropriate for easy access to any Portacath or PICC line.   We strive to give you quality time with your provider. You may need to reschedule your appointment if you arrive late (15 or more minutes).  Arriving late affects you and other patients whose appointments are after yours.  Also, if you miss three or more appointments without notifying the office, you may be dismissed from the clinic at the provider's discretion.      For prescription refill requests, have your pharmacy contact our office and allow 72 hours for refills to be completed.    Today you received the following chemotherapy and/or immunotherapy agents : Zometa/ Dara    To help prevent nausea and vomiting after your treatment, we encourage you to take your nausea medication as directed.  BELOW ARE SYMPTOMS THAT SHOULD BE REPORTED IMMEDIATELY: *FEVER GREATER THAN 100.4 F (38 C) OR HIGHER *CHILLS OR SWEATING *NAUSEA AND VOMITING THAT IS NOT CONTROLLED WITH YOUR NAUSEA MEDICATION *UNUSUAL SHORTNESS OF BREATH *UNUSUAL BRUISING OR BLEEDING *URINARY PROBLEMS (pain or burning when urinating, or frequent urination) *BOWEL PROBLEMS (unusual diarrhea, constipation, pain near the anus) TENDERNESS IN MOUTH AND THROAT WITH OR WITHOUT PRESENCE OF ULCERS (sore throat, sores in mouth, or a toothache) UNUSUAL RASH, SWELLING OR PAIN  UNUSUAL VAGINAL DISCHARGE OR ITCHING   Items with * indicate a potential emergency and should be followed up as soon as possible or go to the Emergency Department if any problems should occur.  Please show the CHEMOTHERAPY ALERT CARD or IMMUNOTHERAPY ALERT CARD at check-in  to the Emergency Department and triage nurse.  Should you have questions after your visit or need to cancel or reschedule your appointment, please contact [facility] CANCER [place] AT [place]  [phone] and follow the prompts.  Office hours are 8:00 a.m. to 4:30 p.m. Monday - Friday. Please note that voicemails left after 4:00 p.m. may not be returned until the following business day.  We are closed weekends and major holidays. You have access to a nurse at all times for urgent questions. Please call the main number to the clinic [phone] and follow the prompts.  For any non-urgent questions, you may also contact your provider using MyChart. We now offer e-Visits for anyone 2 and older to request care online for non-urgent symptoms. For details visit [email or web address].   Also download the MyChart app! Go to the app store, search "MyChart", open the app, select [place], and log in with your MyChart username and password.  Masks are optional in the cancer centers. If you would like for your care team to wear a mask while they are taking care of you, please let them know. For doctor visits, patients may have with them one support person who is at least 65 years old. At this time, visitors are not allowed in the infusion area.

## 2021-08-29 NOTE — Progress Notes (Signed)
Provider to see patient in the infusion suite.

## 2021-08-30 LAB — URINE CULTURE: Culture: NO GROWTH

## 2021-08-31 LAB — KAPPA/LAMBDA LIGHT CHAINS
Kappa free light chain: 16.2 mg/L (ref 3.3–19.4)
Kappa, lambda light chain ratio: 2.79 — ABNORMAL HIGH (ref 0.26–1.65)
Lambda free light chains: 5.8 mg/L (ref 5.7–26.3)

## 2021-09-12 ENCOUNTER — Ambulatory Visit
Admission: RE | Admit: 2021-09-12 | Discharge: 2021-09-12 | Disposition: A | Discharge status: Home / Self Care | Payer: Medicare PPO | Source: Ambulatory Visit | Attending: Internal Medicine | Admitting: Internal Medicine

## 2021-09-12 DIAGNOSIS — Z1231 Encounter for screening mammogram for malignant neoplasm of breast: Secondary | ICD-10-CM | POA: Insufficient documentation

## 2021-09-15 ENCOUNTER — Other Ambulatory Visit: Payer: Self-pay | Admitting: Oncology

## 2021-09-15 DIAGNOSIS — C9000 Multiple myeloma not having achieved remission: Secondary | ICD-10-CM

## 2021-09-21 ENCOUNTER — Other Ambulatory Visit: Payer: Self-pay

## 2021-09-23 NOTE — Progress Notes (Unsigned)
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  INTERVAL HISTORY: Patient returns to clinic today for further evaluation and consideration of cycle 8 of daratumumab and Revlimid.  She has noticed increasing back and neck pain over the past several weeks.  She continues to have a chronic and mild peripheral neuropathy.  She otherwise feels well.  She has no other neurologic complaints.  She denies any recent fevers or illnesses.  She has no chest pain, shortness of breath, cough, or hemoptysis.  She denies any nausea, vomiting, constipation, or diarrhea.  She has no urinary complaints.  Patient offers no further specific complaints today.    REVIEW OF SYSTEMS:   Review of Systems  Constitutional: Negative.  Negative for fever, malaise/fatigue and weight loss.  Respiratory: Negative.  Negative for cough, hemoptysis and shortness of breath.   Cardiovascular:  Negative for chest pain and leg swelling.  Gastrointestinal: Negative.  Negative for abdominal pain, diarrhea and nausea.  Genitourinary: Negative.  Negative for dysuria.  Musculoskeletal:  Positive for back pain and neck pain. Negative for joint pain.  Skin: Negative.  Negative for rash.  Neurological:  Positive for sensory change. Negative for dizziness, tremors, focal weakness, weakness and headaches.  Psychiatric/Behavioral: Negative.  The patient is not nervous/anxious.     As per HPI. Otherwise, a complete review of systems is negative.  PAST MEDICAL HISTORY: Past Medical History:  Diagnosis Date   Anxiety    Diabetes mellitus without complication ([place]) 4193   GERD (gastroesophageal reflux disease)    Hyperlipidemia     Myeloma ([place])    Personal history of colonic polyps    Squamous cell carcinoma of skin 11/25/2013   Left chest. WD SCC with superficial infiltration.   Tachycardia     PAST SURGICAL HISTORY: Past Surgical History:  Procedure Laterality Date   AUGMENTATION MAMMAPLASTY Bilateral 7902   silicone and replacement in 2001   [place], [place], 1998,2003, 2008, 2013   COLONOSCOPY WITH PROPOFOL N/A 08/16/2016   Procedure: COLONOSCOPY WITH PROPOFOL;  Surgeon: Robert Bellow, MD;  Location: [facility] ENDOSCOPY;  Service: Endoscopy;  Laterality: N/A;   DILATION AND CURETTAGE OF UTERUS     ENDOMETRIAL ABLATION  12/1991   ganglion cyst removal      IR IMAGING GUIDED PORT INSERTION  10/27/2020   IR IMAGING GUIDED PORT INSERTION  04/04/2021   KYPHOPLASTY N/A 10/28/2020   Procedure: T12 and L3 KYPHOPLASTY;  Surgeon: Hessie Knows, MD;  Location: [facility] ORS;  Service: Orthopedics;  Laterality: N/A;   TONSILLECTOMY     WRIST SURGERY Right 05/1995    FAMILY HISTORY: Family History  Problem Relation Age of Onset   Colon polyps Sister    Colon cancer Father 72   Diabetes Mother    Breast cancer Neg Hx     ADVANCED DIRECTIVES (Y/N):  N  HEALTH MAINTENANCE: Social History   Tobacco Use   Smoking status: Former    Packs/day: 1.00    Years: 4.00    Total pack years: 4.00    Types: Cigarettes    Quit date: 1982    Years since quitting: 22.6  Smokeless tobacco: Never  Vaping Use   Vaping Use: Never used  Substance Use Topics   Alcohol use: Yes    Alcohol/week: 1.0 - 2.0 standard drink of alcohol    Types: 1 - 2 Glasses of wine per week    Comment: occassionally   Drug use: No     Colonoscopy:  PAP:  Bone density:  Lipid panel:  No Known Allergies  Current Outpatient Medications  Medication Sig Dispense Refill   acetaminophen (TYLENOL) 325 MG tablet Take 650 mg by mouth 2 (two) times a week. Premed for velcade. Taking  Tuesdays and Friday     acyclovir (ZOVIRAX) 400 MG tablet TAKE 1 TABLET BY MOUTH TWICE DAILY 60 tablet 5   ALPRAZolam (XANAX) 0.25 MG tablet TAKE 1 TABLET BY MOUTH AT BEDTIME AS NEEDED FOR SLEEP 30 tablet 2   ASPIRIN 81 PO Take 81 mg by mouth daily.     atorvastatin (LIPITOR) 20 MG tablet Take 20 mg by mouth daily.     busPIRone (BUSPAR) 30 MG tablet Take 30 mg by mouth 2 (two) times daily.     CALCIUM-VITAMIN D PO Take by mouth 2 (two) times daily.     cyclobenzaprine (FLEXERIL) 10 MG tablet TAKE 1 TABLET BY MOUTH AT BEDTIME 30 tablet 2   dexamethasone (DECADRON) 4 MG tablet Take 5 tablets (20 mg total) by mouth once a week. 20 tablet 3   diphenhydrAMINE (BENADRYL) 25 mg capsule Take 25 mg by mouth 2 (two) times a week. Pre med for velcade. Only Tuesdays and Fridays     gabapentin (NEURONTIN) 300 MG capsule Take 300 mg by mouth at bedtime.     lenalidomide (REVLIMID) 10 MG capsule TAKE 1 CAPSULE BY MOUTH 1 TIME A DAY FOR 21 DAYS ON THEN 7 DAYS OFF 21 capsule 0   lidocaine-prilocaine (EMLA) cream APPLY 1 APPLICATION TOPICALLY AS NEEDED 30 g 2   liraglutide (VICTOZA) 18 MG/3ML SOPN Inject 1.2 mg into the skin daily.     Magnesium 400 MG TABS Take 1 tablet by mouth daily.     meloxicam (MOBIC) 15 MG tablet Take 15 mg by mouth daily.     methocarbamol (ROBAXIN) 500 MG tablet Take 1 tablet (500 mg total) by mouth every 6 (six) hours as needed for muscle spasms. 40 tablet 1   metoprolol succinate (TOPROL-XL) 50 MG 24 hr tablet Take 50 mg by mouth. Take 1.5 tabs in am. Take 1 tablet at HS.     Multiple Vitamin (MULTI-VITAMIN) tablet Take 1 tablet by mouth daily.     omeprazole (PRILOSEC) 40 MG capsule Take 40 mg by mouth daily.     ondansetron (ZOFRAN) 8 MG tablet Take 1 tablet (8 mg total) by mouth 2 (two) times daily as needed (Nausea or vomiting). 60 tablet 2   oxyCODONE-acetaminophen (PERCOCET/ROXICET) 5-325 MG tablet TAKE 1-2 TABLETS BY MOUTH EVERY 4 HOURS AS NEEDED FOR SEVERE PAIN 90 tablet 0    prochlorperazine (COMPAZINE) 10 MG tablet TAKE 1 TABLET BY MOUTH EVERY 6 HOURS AS NEEDED FOR NAUSEA OR VOMITING 60 tablet 2   traMADol (ULTRAM) 50 MG tablet Take 1 tablet (50 mg total) by mouth every 6 (six) hours as needed. 60 tablet 1   diphenoxylate-atropine (LOMOTIL) 2.5-0.025 MG tablet Take by mouth as needed. (Patient not taking: Reported on 08/02/2021)     morphine (MS CONTIN) 15 MG 12 hr tablet TAKE 2 TABLETS BY MOUTH EVERY 12 HOURS (Patient not taking: Reported on 08/02/2021) 120 tablet  0   No current facility-administered medications for this visit.   Facility-Administered Medications Ordered in Other Visits  Medication Dose Route Frequency Provider Last Rate Last Admin   0.9 %  sodium chloride infusion   Intravenous Continuous Lloyd Huger, MD   Stopped at 04/07/21 1142   sodium chloride flush (NS) 0.9 % injection 10 mL  10 mL Intravenous PRN Lloyd Huger, MD   10 mL at 11/23/20 0932    OBJECTIVE: Vitals:   09/27/21 0920  BP: (!) 112/52  Pulse: 79  Resp: 16  Temp: 98.3 F (36.8 C)  SpO2: 100%     Body mass index is 23.14 kg/m.    ECOG FS:0 - Asymptomatic  General: Well-developed, well-nourished, no acute distress. Eyes: Pink conjunctiva, anicteric sclera. HEENT: Normocephalic, moist mucous membranes. Lungs: No audible wheezing or coughing. Heart: Regular rate and rhythm. Abdomen: Soft, nontender, no obvious distention. Musculoskeletal: No edema, cyanosis, or clubbing. Neuro: Alert, answering all questions appropriately. Cranial nerves grossly intact. Skin: No rashes or petechiae noted. Psych: Normal affect.   LAB RESULTS:  Lab Results  Component Value Date   NA 140 09/27/2021   K 4.4 09/27/2021   CL 108 09/27/2021   CO2 24 09/27/2021   GLUCOSE 191 (H) 09/27/2021   BUN 21 09/27/2021   CREATININE 1.20 (H) 09/27/2021   CALCIUM 8.7 (L) 09/27/2021   PROT 6.3 (L) 09/27/2021   ALBUMIN 3.8 09/27/2021   AST 96 (H) 09/27/2021   ALT 91 (H) 09/27/2021    ALKPHOS 108 09/27/2021   BILITOT 0.4 09/27/2021   GFRNONAA 50 (L) 09/27/2021    Lab Results  Component Value Date   WBC 3.4 (L) 09/27/2021   NEUTROABS 2.3 09/27/2021   HGB 11.1 (L) 09/27/2021   HCT 34.9 (L) 09/27/2021   MCV 101.5 (H) 09/27/2021   PLT 147 (L) 09/27/2021     STUDIES: MM 3D SCREEN BREAST W/IMPLANT BILATERAL  Result Date: 09/13/2021 CLINICAL DATA:  Screening. EXAM: DIGITAL SCREENING BILATERAL MAMMOGRAM WITH IMPLANTS, CAD AND TOMOSYNTHESIS TECHNIQUE: Bilateral screening digital craniocaudal and mediolateral oblique mammograms were obtained. Bilateral screening digital breast tomosynthesis was performed. The images were evaluated with computer-aided detection. Standard and/or implant displaced views were performed. COMPARISON:  Previous exam(s). ACR Breast Density Category c: The breast tissue is heterogeneously dense, which may obscure small masses. FINDINGS: The patient has retropectoral implants. There are no findings suspicious for malignancy. IMPRESSION: No mammographic evidence of malignancy. A result letter of this screening mammogram will be mailed directly to the patient. RECOMMENDATION: Screening mammogram in one year. (Code:SM-B-01Y) BI-RADS CATEGORY  1: Negative. Electronically Signed   By: Franki Cabot M.D.   On: 09/13/2021 10:40    ONCOLOGY HISTORY:  Diagnosis confirmed from bone biopsy on August 17, 2020.  Initially her lambda free light chains were elevated at 432.1, but now are within normal limits at 12.1.  Immunoglobulins and SPEP are normal.  Bone marrow biopsy completed on August 26, 2020 revealed 25% plasma cells along with the deletion of 1p which is considered poor prognosis.  PET scan results from September 06, 2020 reviewed independently with 3 hypermetabolic lesions in right C5-6 facets, right iliac crest lesion, and L3 vertebral lesion.  After lengthy discussion with the patient, she agreed to pursue chemotherapy with daratumumab, Velcade, Revlimid, and  dexamethasone followed by autologous bone marrow transplant.  Patient received weekly daratumumab for 4 cycles along with Velcade on days 1, 4, 8, and 11.  She also received Revlimid on days 1  through 14 of a 21-day cycle.  She completed cycle 4 of treatment on January 14, 2021.  Subsequent bone marrow biopsy on January 24, 2021 revealed complete remission with no evidence of myeloma.    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  PLAN:      1.  Lambda light chain myeloma with 1p del: Patient underwent autologous stem cell transplant at [facility] on February 24, 2021.  She was readmitted to the hospital on March 04, 2021 with neutropenic fever, diarrhea, and sepsis-like symptoms.  Infectious disease work-up remains negative.  Her blood counts have essentially resolved.  Patient now using consolidation treatment with the GRIFFIN regimen.  Patient received consolidation with cycle 5 and 6 using daratumumab on day 1, Velcade on day 1, 4, 8, and 11, Revlimid 10 mg on days 1 through 14 with 7 days off, and weekly dexamethasone on a 21-day cycle.  She is now on maintenance daratumumab on day 1 and Revlimid 10 mg on days 1 through 21 on a 28-day cycle for cycles 7 through 32.  Proceed with cycle 8 today.  Patient takes all of her premedications at home prior to clinic, therefore these have been removed from the treatment plan.  Patient will also receive Zometa today.  Return to clinic in 4 weeks for further evaluation and continuation of treatment.  Appreciate clinical pharmacy input.  Patient will also have a restaging bone marrow biopsy in the next 1 to 2 weeks. 2.  Pain: She has completed XRT.  She had kyphoplasty x2.  Continue Percocet and tramadol as needed. Repeat MRI of cervical, thoracic, and lumbar spine. 3.  Hypercalcemia: Resolved, patient now has a mild hypocalcemia.  Zometa as above.   4.  Renal insufficiency: Chronic and unchanged.  Patient's creatinine is  1.20. 5.  Anemia: Chronic and unchanged.  Patient's hemoglobin is 11.1. 6.  Thrombocytopenia: Nearly resolved. 7.  Vaccinations: Continue revaccination schedule as per [facility]. 8.  Transaminitis: Mild, proceed with treatment as above.  Patient expressed understanding and was in agreement with this plan. She also understands that She can call clinic at any time with any questions, concerns, or complaints.      Cancer Staging  Lambda light chain myeloma ([place]) Staging form: Plasma Cell Myeloma and Plasma Cell Disorders, AJCC 8th Edition - Clinical stage from 09/16/2020: RISS Stage I (Beta-2-microglobulin (mg/L): 2.5, Albumin (g/dL): 4.9, ISS: Stage I, High-risk cytogenetics: Absent, LDH: Normal) - Signed by Lloyd Huger, MD on 09/16/2020 Beta 2 microglobulin range (mg/L): Less than 3.5 Albumin range (g/dL): Greater than or equal to 3.5 Cytogenetics: 1p deletion  Lloyd Huger, MD   09/28/2021 6:19 AM

## 2021-09-26 ENCOUNTER — Other Ambulatory Visit: Payer: Self-pay | Admitting: *Deleted

## 2021-09-26 DIAGNOSIS — C9000 Multiple myeloma not having achieved remission: Secondary | ICD-10-CM

## 2021-09-27 ENCOUNTER — Inpatient Hospital Stay: Payer: Medicare PPO | Admitting: Oncology

## 2021-09-27 ENCOUNTER — Encounter: Payer: Self-pay | Admitting: Oncology

## 2021-09-27 ENCOUNTER — Inpatient Hospital Stay: Payer: Medicare PPO

## 2021-09-27 ENCOUNTER — Inpatient Hospital Stay: Payer: Medicare PPO | Attending: Oncology

## 2021-09-27 VITALS — BP 112/52 | HR 79 | Temp 98.3°F | Resp 16 | Ht 61.5 in | Wt 124.5 lb

## 2021-09-27 DIAGNOSIS — R202 Paresthesia of skin: Secondary | ICD-10-CM

## 2021-09-27 DIAGNOSIS — D649 Anemia, unspecified: Secondary | ICD-10-CM | POA: Diagnosis not present

## 2021-09-27 DIAGNOSIS — R200 Anesthesia of skin: Secondary | ICD-10-CM | POA: Diagnosis not present

## 2021-09-27 DIAGNOSIS — C9001 Multiple myeloma in remission: Secondary | ICD-10-CM | POA: Diagnosis present

## 2021-09-27 DIAGNOSIS — Z79899 Other long term (current) drug therapy: Secondary | ICD-10-CM | POA: Insufficient documentation

## 2021-09-27 DIAGNOSIS — Z5112 Encounter for antineoplastic immunotherapy: Secondary | ICD-10-CM | POA: Diagnosis present

## 2021-09-27 DIAGNOSIS — D696 Thrombocytopenia, unspecified: Secondary | ICD-10-CM | POA: Diagnosis not present

## 2021-09-27 DIAGNOSIS — C9000 Multiple myeloma not having achieved remission: Secondary | ICD-10-CM

## 2021-09-27 DIAGNOSIS — N289 Disorder of kidney and ureter, unspecified: Secondary | ICD-10-CM | POA: Insufficient documentation

## 2021-09-27 DIAGNOSIS — Z923 Personal history of irradiation: Secondary | ICD-10-CM | POA: Insufficient documentation

## 2021-09-27 LAB — COMPREHENSIVE METABOLIC PANEL
ALT: 91 U/L — ABNORMAL HIGH (ref 0–44)
AST: 96 U/L — ABNORMAL HIGH (ref 15–41)
Albumin: 3.8 g/dL (ref 3.5–5.0)
Alkaline Phosphatase: 108 U/L (ref 38–126)
Anion gap: 8 (ref 5–15)
BUN: 21 mg/dL (ref 8–23)
CO2: 24 mmol/L (ref 22–32)
Calcium: 8.7 mg/dL — ABNORMAL LOW (ref 8.9–10.3)
Chloride: 108 mmol/L (ref 98–111)
Creatinine, Ser: 1.2 mg/dL — ABNORMAL HIGH (ref 0.44–1.00)
GFR, Estimated: 50 mL/min — ABNORMAL LOW (ref >60)
Glucose, Bld: 191 mg/dL — ABNORMAL HIGH (ref 70–99)
Potassium: 4.4 mmol/L (ref 3.5–5.1)
Sodium: 140 mmol/L (ref 135–145)
Total Bilirubin: 0.4 mg/dL (ref 0.3–1.2)
Total Protein: 6.3 g/dL — ABNORMAL LOW (ref 6.5–8.1)

## 2021-09-27 LAB — CBC WITH DIFFERENTIAL/PLATELET
Abs Immature Granulocytes: 0.01 10*3/uL (ref 0.00–0.07)
Basophils Absolute: 0.1 10*3/uL (ref 0.0–0.1)
Basophils Relative: 2 %
Eosinophils Absolute: 0.2 10*3/uL (ref 0.0–0.5)
Eosinophils Relative: 5 %
HCT: 34.9 % — ABNORMAL LOW (ref 36.0–46.0)
Hemoglobin: 11.1 g/dL — ABNORMAL LOW (ref 12.0–15.0)
Immature Granulocytes: 0 %
Lymphocytes Relative: 18 %
Lymphs Abs: 0.6 10*3/uL — ABNORMAL LOW (ref 0.7–4.0)
MCH: 32.3 pg (ref 26.0–34.0)
MCHC: 31.8 g/dL (ref 30.0–36.0)
MCV: 101.5 fL — ABNORMAL HIGH (ref 80.0–100.0)
Monocytes Absolute: 0.3 10*3/uL (ref 0.1–1.0)
Monocytes Relative: 7 %
Neutro Abs: 2.3 10*3/uL (ref 1.7–7.7)
Neutrophils Relative %: 68 %
Platelets: 147 10*3/uL — ABNORMAL LOW (ref 150–400)
RBC: 3.44 MIL/uL — ABNORMAL LOW (ref 3.87–5.11)
RDW: 14.7 % (ref 11.5–15.5)
WBC: 3.4 10*3/uL — ABNORMAL LOW (ref 4.0–10.5)
nRBC: 0 % (ref 0.0–0.2)

## 2021-09-27 MED ORDER — SODIUM CHLORIDE 0.9 % IV SOLN
Freq: Once | INTRAVENOUS | Status: AC
  Filled 2021-09-27: qty 250

## 2021-09-27 MED ORDER — HEPARIN SOD (PORK) LOCK FLUSH 100 UNIT/ML IV SOLN
500.0000 [IU] | Freq: Once | INTRAVENOUS | Status: AC | PRN
  Administered 2021-09-27: 500 [IU]
  Filled 2021-09-27: qty 5

## 2021-09-27 MED ORDER — ZOLEDRONIC ACID 4 MG/5ML IV CONC
3.3000 mg | Freq: Once | INTRAVENOUS | Status: AC
  Administered 2021-09-27: 3.3 mg via INTRAVENOUS
  Filled 2021-09-27: qty 4.13

## 2021-09-27 MED ORDER — DARATUMUMAB-HYALURONIDASE-FIHJ 1800-30000 MG-UT/15ML ~~LOC~~ SOLN
1800.0000 mg | Freq: Once | SUBCUTANEOUS | Status: AC
  Administered 2021-09-27: 1800 mg via SUBCUTANEOUS
  Filled 2021-09-27: qty 15

## 2021-09-27 NOTE — Patient Instructions (Signed)
[facility]  Discharge Instructions: Thank you for choosing [facility] to provide your oncology and hematology care.   If you have a lab appointment with the Cancer Center, please go directly to the Cancer Center and check in at the registration area.   Wear comfortable clothing and clothing appropriate for easy access to any Portacath or PICC line.   We strive to give you quality time with your provider. You may need to reschedule your appointment if you arrive late (15 or more minutes).  Arriving late affects you and other patients whose appointments are after yours.  Also, if you miss three or more appointments without notifying the office, you may be dismissed from the clinic at the provider's discretion.      For prescription refill requests, have your pharmacy contact our office and allow 72 hours for refills to be completed.    Today you received the following chemotherapy and/or immunotherapy agents       To help prevent nausea and vomiting after your treatment, we encourage you to take your nausea medication as directed.  BELOW ARE SYMPTOMS THAT SHOULD BE REPORTED IMMEDIATELY: *FEVER GREATER THAN 100.4 F (38 C) OR HIGHER *CHILLS OR SWEATING *NAUSEA AND VOMITING THAT IS NOT CONTROLLED WITH YOUR NAUSEA MEDICATION *UNUSUAL SHORTNESS OF BREATH *UNUSUAL BRUISING OR BLEEDING *URINARY PROBLEMS (pain or burning when urinating, or frequent urination) *BOWEL PROBLEMS (unusual diarrhea, constipation, pain near the anus) TENDERNESS IN MOUTH AND THROAT WITH OR WITHOUT PRESENCE OF ULCERS (sore throat, sores in mouth, or a toothache) UNUSUAL RASH, SWELLING OR PAIN  UNUSUAL VAGINAL DISCHARGE OR ITCHING   Items with * indicate a potential emergency and should be followed up as soon as possible or go to the Emergency Department if any problems should occur.  Please show the CHEMOTHERAPY ALERT CARD or IMMUNOTHERAPY ALERT CARD at check-in to the  Emergency Department and triage nurse.  Should you have questions after your visit or need to cancel or reschedule your appointment, please contact [facility]  Dept: [phone]  and follow the prompts.  Office hours are 8:00 a.m. to 4:30 p.m. Monday - Friday. Please note that voicemails left after 4:00 p.m. may not be returned until the following business day.  We are closed weekends and major holidays. You have access to a nurse at all times for urgent questions. Please call the main number to the clinic Dept: [phone] and follow the prompts.   For any non-urgent questions, you may also contact your provider using MyChart. We now offer e-Visits for anyone 18 and older to request care online for non-urgent symptoms. For details visit [email or web address].   Also download the MyChart app! Go to the app store, search "MyChart", open the app, select [facility], and log in with your MyChart username and password.  Masks are optional in the cancer centers. If you would like for your care team to wear a mask while they are taking care of you, please let them know. You may have one support person who is at least 65 years old accompany you for your appointments. 

## 2021-09-27 NOTE — Progress Notes (Signed)
Patient took her premeds prior to appointment.

## 2021-09-28 ENCOUNTER — Telehealth: Payer: Self-pay | Admitting: *Deleted

## 2021-09-28 ENCOUNTER — Other Ambulatory Visit: Payer: Self-pay

## 2021-09-28 ENCOUNTER — Encounter: Payer: Self-pay | Admitting: Oncology

## 2021-09-28 LAB — KAPPA/LAMBDA LIGHT CHAINS
Kappa free light chain: 12.9 mg/L (ref 3.3–19.4)
Kappa, lambda light chain ratio: 2.93 — ABNORMAL HIGH (ref 0.26–1.65)
Lambda free light chains: 4.4 mg/L — ABNORMAL LOW (ref 5.7–26.3)

## 2021-09-28 NOTE — Telephone Encounter (Signed)
Call placed to patient to review appointment details for bone marrow biopsy. No answer, left vm.

## 2021-09-29 NOTE — Progress Notes (Signed)
Patient on schedule for BMB 8/7, called and spoke with patient on phone with pre procedure instructions given. Made aware to be here @ 0730, NPO after MN, and driver post procedure/recovery/discharge. Stated understanding.

## 2021-09-30 ENCOUNTER — Other Ambulatory Visit (HOSPITAL_COMMUNITY): Payer: Self-pay | Admitting: Radiology

## 2021-09-30 DIAGNOSIS — C9000 Multiple myeloma not having achieved remission: Secondary | ICD-10-CM

## 2021-10-03 ENCOUNTER — Ambulatory Visit
Admission: RE | Admit: 2021-10-03 | Discharge: 2021-10-03 | Disposition: A | Discharge status: Home / Self Care | Payer: Medicare PPO | Source: Ambulatory Visit | Attending: Oncology | Admitting: Oncology

## 2021-10-03 DIAGNOSIS — C9000 Multiple myeloma not having achieved remission: Secondary | ICD-10-CM

## 2021-10-03 DIAGNOSIS — K219 Gastro-esophageal reflux disease without esophagitis: Secondary | ICD-10-CM | POA: Insufficient documentation

## 2021-10-03 DIAGNOSIS — D61818 Other pancytopenia: Secondary | ICD-10-CM | POA: Insufficient documentation

## 2021-10-03 DIAGNOSIS — E785 Hyperlipidemia, unspecified: Secondary | ICD-10-CM | POA: Insufficient documentation

## 2021-10-03 DIAGNOSIS — F419 Anxiety disorder, unspecified: Secondary | ICD-10-CM | POA: Diagnosis not present

## 2021-10-03 DIAGNOSIS — Z31438 Encounter for other genetic testing of female for procreative management: Secondary | ICD-10-CM | POA: Diagnosis not present

## 2021-10-03 LAB — CBC WITH DIFFERENTIAL/PLATELET
Abs Immature Granulocytes: 0.01 10*3/uL (ref 0.00–0.07)
Basophils Absolute: 0.1 10*3/uL (ref 0.0–0.1)
Basophils Relative: 2 %
Eosinophils Absolute: 0.4 10*3/uL (ref 0.0–0.5)
Eosinophils Relative: 14 %
HCT: 34.9 % — ABNORMAL LOW (ref 36.0–46.0)
Hemoglobin: 11.2 g/dL — ABNORMAL LOW (ref 12.0–15.0)
Immature Granulocytes: 0 %
Lymphocytes Relative: 21 %
Lymphs Abs: 0.6 10*3/uL — ABNORMAL LOW (ref 0.7–4.0)
MCH: 31.7 pg (ref 26.0–34.0)
MCHC: 32.1 g/dL (ref 30.0–36.0)
MCV: 98.9 fL (ref 80.0–100.0)
Monocytes Absolute: 0.3 10*3/uL (ref 0.1–1.0)
Monocytes Relative: 11 %
Neutro Abs: 1.4 10*3/uL — ABNORMAL LOW (ref 1.7–7.7)
Neutrophils Relative %: 52 %
Platelets: 148 10*3/uL — ABNORMAL LOW (ref 150–400)
RBC: 3.53 MIL/uL — ABNORMAL LOW (ref 3.87–5.11)
RDW: 15 % (ref 11.5–15.5)
WBC: 2.6 10*3/uL — ABNORMAL LOW (ref 4.0–10.5)
nRBC: 0 % (ref 0.0–0.2)

## 2021-10-03 MED ORDER — FENTANYL CITRATE (PF) 100 MCG/2ML IJ SOLN
INTRAMUSCULAR | Status: AC | PRN
  Administered 2021-10-03 (×2): 50 ug via INTRAVENOUS

## 2021-10-03 MED ORDER — MIDAZOLAM HCL 2 MG/2ML IJ SOLN
INTRAMUSCULAR | Status: AC
  Filled 2021-10-03: qty 2

## 2021-10-03 MED ORDER — SODIUM CHLORIDE 0.9 % IV SOLN: INTRAVENOUS | Status: DC

## 2021-10-03 MED ORDER — FENTANYL CITRATE (PF) 100 MCG/2ML IJ SOLN
INTRAMUSCULAR | Status: AC
  Filled 2021-10-03: qty 2

## 2021-10-03 MED ORDER — HEPARIN SOD (PORK) LOCK FLUSH 100 UNIT/ML IV SOLN
500.0000 [IU] | Freq: Once | INTRAVENOUS | Status: AC
Start: 2021-10-03 — End: 2021-10-03
  Filled 2021-10-03: qty 5

## 2021-10-03 MED ORDER — MIDAZOLAM HCL 5 MG/5ML IJ SOLN
INTRAMUSCULAR | Status: AC | PRN
  Administered 2021-10-03 (×2): 1 mg via INTRAVENOUS

## 2021-10-03 MED ORDER — HEPARIN SOD (PORK) LOCK FLUSH 100 UNIT/ML IV SOLN
INTRAVENOUS | Status: AC
  Filled 2021-10-03: qty 5

## 2021-10-03 MED ORDER — HEPARIN SOD (PORK) LOCK FLUSH 100 UNIT/ML IV SOLN
INTRAVENOUS | Status: AC
  Administered 2021-10-03: 500 [IU] via INTRAVENOUS
  Filled 2021-10-03: qty 5

## 2021-10-03 NOTE — Discharge Instructions (Signed)
Bone Marrow Aspiration and Bone Marrow Biopsy, Adult, Care After This sheet gives you information about how to care for yourself after your procedure. If you have problems or questions, contact your health care provider.  What can I expect after the procedure?  After the procedure, it is common to have: Mild pain and tenderness. Swelling. Bruising.  Follow these instructions at home: Take over-the-counter or prescription medicines only as told by your health care provider. You may shower tomorrow Remove band aid tomorrow, replace with another bandaid if  site has any drainage from biopsy site. Wash your hands with soap and water before you touch your biopsy site  If soap and water are not available, use hand sanitizer. Change your dressing frequently for bleeding and/or drainage. Check your puncture site every day for signs of infection. Check for: More redness, swelling, or pain. More fluid or blood. Warmth. Pus or a bad smell. Return to your normal activities in 24hours.  Do not drive for 24 hours if you were given a medicine to help you relax (sedative). Keep all follow-up visits as told by your health care provider. This is important. Contact a health care provider if: You have more redness, swelling, or pain around the puncture site. You have more fluid or blood coming from the puncture site. Your puncture site feels warm to the touch. You have pus or a bad smell coming from the puncture site. You have a fever. Your pain is not controlled with medicine. This information is not intended to replace advice given to you by your health care provider. Make sure you discuss any questions you have with your health care provider. Document Released: 09/02/2004 Document Revised: 09/03/2015 Document Reviewed: 07/28/2015 Elsevier Interactive Patient Education  2018 [employer] 

## 2021-10-03 NOTE — Procedures (Signed)
Interventional Radiology Procedure Note  Date of Procedure: 10/03/2021  Procedure: CT BMBx  Findings:  1. CT BMBx right ilium    Complications: No immediate complications noted.   Estimated Blood Loss: minimal  Follow-up and Recommendations: 1. Bedrest 1 hour    Hannah Felling, MD  Vascular & Interventional Radiology  10/03/2021 9:42 AM

## 2021-10-03 NOTE — Progress Notes (Signed)
Patient clinically stable post BMB per Dr Denna Haggard, tolerated well. Received Versed 2 mg along with Fentanyl 100 mcg IV for procedure. Report given to Carlynn Spry RN post procedure/specials.

## 2021-10-03 NOTE — H&P (Addendum)
Chief Complaint: Patient was seen in consultation today for CT-guided bone marrow biopsy at the request of HannahTimothy Mcdonald  Referring Physician(s): HannahTimothy Mcdonald  Supervising Physician: Hannah Mcdonald  Patient Status: [facility] - Out-pt  History of Present Illness: Hannah Mcdonald is a 66 y.o. female with PMH of anxiety, diabetes, GERD, hyperlipidemia, and lambda light chain myeloma being seen today for CT-guided bone marrow biopsy. 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 She presents today for CT-guided bone marrow biopsy to evaluate if she is in remission.  Past Medical History:  Diagnosis Date   Anxiety    Diabetes mellitus without complication (HCC) 2016   GERD (gastroesophageal reflux disease)    Hyperlipidemia    Myeloma (HCC)    Personal history of colonic polyps    Squamous cell carcinoma of skin 11/25/2013   Left chest. WD SCC with superficial infiltration.   Tachycardia     Past Surgical History:  Procedure Laterality Date   AUGMENTATION MAMMAPLASTY Bilateral 1983   silicone and replacement in 2001   BREAST ENHANCEMENT SURGERY  1983   CESAREAN SECTION  1987, 1989   COLONOSCOPY  1988, 1998,2003, 2008, 2013   COLONOSCOPY WITH PROPOFOL N/A 08/16/2016   Procedure: COLONOSCOPY WITH PROPOFOL;  Surgeon: Hannah Mayotte, MD;  Location: [facility] ENDOSCOPY;  Service: Endoscopy;  Laterality: N/A;   DILATION AND CURETTAGE OF UTERUS     ENDOMETRIAL ABLATION  12/1991   ganglion cyst removal      IR IMAGING GUIDED PORT INSERTION  10/27/2020   IR IMAGING GUIDED PORT INSERTION  04/04/2021   KYPHOPLASTY N/A 10/28/2020   Procedure: T12 and L3 KYPHOPLASTY;  Surgeon: Hannah Bucker, MD;  Location: [facility] ORS;  Service: Orthopedics;  Laterality: N/A;   TONSILLECTOMY     WRIST SURGERY Right 05/1995    Allergies: Patient has no known allergies.  Medications: Prior to Admission medications    Medication Sig Start Date End Date Taking? Authorizing Provider  acetaminophen (TYLENOL) 325 MG tablet Take 650 mg by mouth 2 (two) times a week. Premed for velcade. Taking Tuesdays and Friday   Yes [provider]  acyclovir (ZOVIRAX) 400 MG tablet TAKE 1 TABLET BY MOUTH TWICE DAILY 05/25/21  Yes Hannah Ruths, MD  ALPRAZolam (XANAX) 0.25 MG tablet TAKE 1 TABLET BY MOUTH AT BEDTIME AS NEEDED FOR SLEEP 08/10/21  Yes Hannah Ruths, MD  ASPIRIN 81 PO Take 81 mg by mouth daily. 06/21/21  Yes [provider]  atorvastatin (LIPITOR) 20 MG tablet Take 20 mg by mouth daily.   Yes [provider]  busPIRone (BUSPAR) 30 MG tablet Take 30 mg by mouth 2 (two) times daily.   Yes [provider]  CALCIUM-VITAMIN D PO Take by mouth 2 (two) times daily.   Yes [provider]  cyclobenzaprine (FLEXERIL) 10 MG tablet TAKE 1 TABLET BY MOUTH AT BEDTIME 02/18/21  Yes Burns, Renda Rolls, NP  dexamethasone (DECADRON) 4 MG tablet Take 5 tablets (20 mg total) by mouth once a week. 06/14/21  Yes Hannah Ruths, MD  diphenhydrAMINE (BENADRYL) 25 mg capsule Take 25 mg by mouth 2 (two) times a week. Pre med for velcade. Only Tuesdays and Fridays   Yes [provider]  gabapentin (NEURONTIN) 300 MG capsule Take 300 mg by mouth at bedtime.   Yes [provider]  lenalidomide (REVLIMID) 10 MG capsule TAKE 1 CAPSULE BY MOUTH 1 TIME A  DAY FOR 21 DAYS ON THEN 7 DAYS OFF 09/15/21  Yes Hannah Huger, MD  lidocaine-prilocaine (EMLA) cream APPLY 1 APPLICATION TOPICALLY AS NEEDED 02/17/21  Yes Hannah Huger, MD  liraglutide (VICTOZA) 18 MG/3ML SOPN Inject 1.2 mg into the skin daily.   Yes [provider]  Magnesium 400 MG TABS Take 1 tablet by mouth daily.   Yes [provider]  meloxicam (MOBIC) 15 MG tablet Take 15 mg by mouth daily.   Yes [provider]  methocarbamol (ROBAXIN) 500 MG tablet Take 1 tablet (500 mg total)  by mouth every 6 (six) hours as needed for muscle spasms. 10/28/20  Yes Hannah Knows, MD  metoprolol succinate (TOPROL-XL) 50 MG 24 hr tablet Take 50 mg by mouth. Take 1.5 tabs in am. Take 1 tablet at HS.   Yes [provider]  morphine (MS CONTIN) 15 MG 12 hr tablet TAKE 2 TABLETS BY MOUTH EVERY 12 HOURS 02/11/21  Yes Hannah Huger, MD  Multiple Vitamin (MULTI-VITAMIN) tablet Take 1 tablet by mouth daily.   Yes [provider]  omeprazole (PRILOSEC) 40 MG capsule Take 40 mg by mouth daily.   Yes [provider]  ondansetron (ZOFRAN) 8 MG tablet Take 1 tablet (8 mg total) by mouth 2 (two) times daily as needed (Nausea or vomiting). 10/19/20  Yes Hannah Huger, MD  oxyCODONE-acetaminophen (PERCOCET/ROXICET) 5-325 MG tablet TAKE 1-2 TABLETS BY MOUTH EVERY 4 HOURS AS NEEDED FOR SEVERE PAIN 02/11/21  Yes Hannah Huger, MD  prochlorperazine (COMPAZINE) 10 MG tablet TAKE 1 TABLET BY MOUTH EVERY 6 HOURS AS NEEDED FOR NAUSEA OR VOMITING 07/06/21  Yes Hannah Huger, MD  traMADol (ULTRAM) 50 MG tablet Take 1 tablet (50 mg total) by mouth every 6 (six) hours as needed. 07/06/21  Yes Hannah Huger, MD  diphenoxylate-atropine (LOMOTIL) 2.5-0.025 MG tablet Take by mouth as needed. Patient not taking: Reported on 08/02/2021 03/16/21   [provider]     Family History  Problem Relation Age of Onset   Colon polyps Sister    Colon cancer Father 85   Diabetes Mother    Breast cancer Neg Hx     Social History   Socioeconomic History   Marital status: Married    Spouse name: Not on file   Number of children: Not on file   Years of education: Not on file   Highest education level: Not on file  Occupational History   Not on file  Tobacco Use   Smoking status: Former    Packs/day: 1.00    Years: 4.00    Total pack years: 4.00    Types: Cigarettes    Quit date: 69    Years since quitting: 41.6   Smokeless tobacco: Never  Vaping Use    Vaping Use: Never used  Substance and Sexual Activity   Alcohol use: Yes    Alcohol/week: 1.0 - 2.0 standard drink of alcohol    Types: 1 - 2 Glasses of wine per week    Comment: occassionally   Drug use: No   Sexual activity: Not Currently  Other Topics Concern   Not on file  Social History Narrative   Not on file   Social Determinants of Health   Financial Resource Strain: Not on file  Food Insecurity: Not on file  Transportation Needs: Not on file  Physical Activity: Not on file  Stress: Not on file  Social Connections: Not on file  Review of Systems: A 12 point ROS discussed and pertinent positives are indicated in the HPI above.  All other systems are negative.  Review of Systems  Constitutional:  Negative for chills and fever.  Respiratory:  Negative for chest tightness and shortness of breath.   Cardiovascular:  Negative for chest pain and leg swelling.  Gastrointestinal:  Negative for diarrhea, nausea and vomiting.  Neurological:  Negative for headaches.  Psychiatric/Behavioral:  Negative for confusion.     Vital Signs: BP (!) 140/62   Pulse 90   Temp 98.4 F (36.9 C) (Oral)   Resp 20   Ht 5' 1" (1.549 m)   Wt 115 lb (52.2 kg)   SpO2 100%   BMI 21.73 kg/m     Physical Exam Constitutional:      General: She is not in acute distress. HENT:     Mouth/Throat:     Mouth: Mucous membranes are moist.  Cardiovascular:     Rate and Rhythm: Normal rate and regular rhythm.     Pulses: Normal pulses.     Heart sounds: Normal heart sounds.  Pulmonary:     Effort: Pulmonary effort is normal.     Breath sounds: Normal breath sounds.  Abdominal:     General: Abdomen is flat. Bowel sounds are normal.     Palpations: Abdomen is soft.  Musculoskeletal:     Right lower leg: No edema.     Left lower leg: No edema.  Neurological:     Mental Status: She is alert and oriented to person, place, and time.  Psychiatric:        Mood and Affect: Mood normal.         Behavior: Behavior normal.     Imaging: MM 3D SCREEN BREAST W/IMPLANT BILATERAL  Result Date: 09/13/2021 CLINICAL DATA:  Screening. EXAM: DIGITAL SCREENING BILATERAL MAMMOGRAM WITH IMPLANTS, CAD AND TOMOSYNTHESIS TECHNIQUE: Bilateral screening digital craniocaudal and mediolateral oblique mammograms were obtained. Bilateral screening digital breast tomosynthesis was performed. The images were evaluated with computer-aided detection. Standard and/or implant displaced views were performed. COMPARISON:  Previous exam(s). ACR Breast Density Category c: The breast tissue is heterogeneously dense, which may obscure small masses. FINDINGS: The patient has retropectoral implants. There are no findings suspicious for malignancy. IMPRESSION: No mammographic evidence of malignancy. A result letter of this screening mammogram will be mailed directly to the patient. RECOMMENDATION: Screening mammogram in one year. (Code:SM-B-01Y) BI-RADS CATEGORY  1: Negative. Electronically Signed   By: Franki Cabot M.D.   On: 09/13/2021 10:40    Labs:  CBC: Recent Labs    07/22/21 0841 08/02/21 0914 08/29/21 0842 09/27/21 0855  WBC 4.7 5.0 3.4* 3.4*  HGB 10.5* 11.7* 11.2* 11.1*  HCT 32.3* 36.3 34.6* 34.9*  PLT 103* 147* 154 147*    COAGS: No results for input(s): "INR", "APTT" in the last 8760 hours.  BMP: Recent Labs    07/22/21 0841 08/02/21 0914 08/29/21 0842 09/27/21 0855  NA 141 138 139 140  K 3.8 4.4 4.1 4.4  CL 111 105 108 108  CO2 _0 GLUCOSE 136* 202* 133* 191*  BUN _1 CALCIUM 8.3* 8.9 8.4* 8.7*  CREATININE 1.08* 1.20* 0.84 1.20*  GFRNONAA 57* 50* >60 50*    LIVER FUNCTION TESTS: Recent Labs    07/22/21 0841 08/02/21 0914 08/29/21 0842 09/27/21 0855  BILITOT 0.6 0.5 0.4 0.4  AST 55* 49* 34 96*  ALT 162* 96* 41 91*  ALKPHOS 126 127* 84 108  PROT 5.9* 6.6 5.9* 6.3*  ALBUMIN 3.5 3.9 3.5 3.8    TUMOR MARKERS: No results for input(s): "AFPTM", "CEA",  "CA199", "CHROMGRNA" in the last 8760 hours.  Assessment and Plan:  Hannah Mcdonald is a 66 yo female with history of lambda light chain myeloma who is being seen today for CT-guided bone marrow biopsy.   Risks and benefits of CT-guided bone marrow biopsy was discussed with the patient including, but not limited to bleeding, infection, damage to adjacent structures or low yield requiring additional tests.  All of the questions were answered and there is agreement to proceed.  Consent signed and in chart.   Thank you for this interesting consult.  I greatly enjoyed meeting Catie Chiao and look forward to participating in their care.  A copy of this report was sent to the requesting provider on this date.  Electronically Signed: Lura Em, PA-C 10/03/2021, 8:14 AM   I spent a total of   15 Minutes in face to face in clinical consultation, greater than 50% of which was counseling/coordinating care for CT-guided bone marrow biopsy.

## 2021-10-05 ENCOUNTER — Encounter: Payer: Self-pay | Admitting: General Surgery

## 2021-10-05 ENCOUNTER — Encounter
Admission: RE | Disposition: A | Discharge status: Home / Self Care | Payer: Self-pay | Source: Home / Self Care | Attending: General Surgery

## 2021-10-05 ENCOUNTER — Ambulatory Visit: Payer: Medicare PPO | Admitting: Anesthesiology

## 2021-10-05 ENCOUNTER — Ambulatory Visit
Admission: RE | Admit: 2021-10-05 | Discharge: 2021-10-05 | Disposition: A | Discharge status: Home / Self Care | Payer: Medicare PPO | Attending: General Surgery | Admitting: General Surgery

## 2021-10-05 DIAGNOSIS — Z1211 Encounter for screening for malignant neoplasm of colon: Secondary | ICD-10-CM | POA: Insufficient documentation

## 2021-10-05 DIAGNOSIS — Z7985 Long-term (current) use of injectable non-insulin antidiabetic drugs: Secondary | ICD-10-CM | POA: Diagnosis not present

## 2021-10-05 DIAGNOSIS — Z800 Family history of malignant neoplasm of digestive organs: Secondary | ICD-10-CM | POA: Insufficient documentation

## 2021-10-05 DIAGNOSIS — Z86010 Personal history of colonic polyps: Secondary | ICD-10-CM | POA: Insufficient documentation

## 2021-10-05 DIAGNOSIS — E785 Hyperlipidemia, unspecified: Secondary | ICD-10-CM | POA: Insufficient documentation

## 2021-10-05 DIAGNOSIS — E119 Type 2 diabetes mellitus without complications: Secondary | ICD-10-CM | POA: Insufficient documentation

## 2021-10-05 DIAGNOSIS — Z85828 Personal history of other malignant neoplasm of skin: Secondary | ICD-10-CM | POA: Diagnosis not present

## 2021-10-05 DIAGNOSIS — K219 Gastro-esophageal reflux disease without esophagitis: Secondary | ICD-10-CM | POA: Insufficient documentation

## 2021-10-05 DIAGNOSIS — Z87891 Personal history of nicotine dependence: Secondary | ICD-10-CM | POA: Diagnosis not present

## 2021-10-05 HISTORY — PX: COLONOSCOPY WITH PROPOFOL: SHX5780

## 2021-10-05 LAB — GLUCOSE, CAPILLARY: Glucose-Capillary: 123 mg/dL — ABNORMAL HIGH (ref 70–99)

## 2021-10-05 LAB — SURGICAL PATHOLOGY

## 2021-10-05 SURGERY — COLONOSCOPY WITH PROPOFOL
Anesthesia: General

## 2021-10-05 MED ORDER — PHENYLEPHRINE 80 MCG/ML (10ML) SYRINGE FOR IV PUSH (FOR BLOOD PRESSURE SUPPORT)
PREFILLED_SYRINGE | INTRAVENOUS | Status: DC | PRN
  Administered 2021-10-05 (×2): 80 ug via INTRAVENOUS

## 2021-10-05 MED ORDER — PROPOFOL 500 MG/50ML IV EMUL
INTRAVENOUS | Status: DC | PRN
  Administered 2021-10-05: 150 ug/kg/min via INTRAVENOUS

## 2021-10-05 MED ORDER — HEPARIN SOD (PORK) LOCK FLUSH 100 UNIT/ML IV SOLN
250.0000 [IU] | INTRAVENOUS | Status: AC | PRN
  Administered 2021-10-05: 250 [IU]

## 2021-10-05 MED ORDER — SODIUM CHLORIDE 0.9% FLUSH
10.0000 mL | INTRAVENOUS | Status: AC | PRN
  Administered 2021-10-05: 10 mL

## 2021-10-05 MED ORDER — PROPOFOL 10 MG/ML IV BOLUS
INTRAVENOUS | Status: DC | PRN
  Administered 2021-10-05: 50 mg via INTRAVENOUS

## 2021-10-05 MED ORDER — AMPICILLIN SODIUM 2 G IJ SOLR
2.0000 g | Freq: Once | INTRAMUSCULAR | Status: AC
  Administered 2021-10-05: 2 g via INTRAVENOUS
  Filled 2021-10-05: qty 2000

## 2021-10-05 MED ORDER — HEPARIN SOD (PORK) LOCK FLUSH 100 UNIT/ML IV SOLN
INTRAVENOUS | Status: AC
  Filled 2021-10-05: qty 5

## 2021-10-05 MED ORDER — SODIUM CHLORIDE 0.9 % IV SOLN
INTRAVENOUS | Status: DC
  Administered 2021-10-05: 20 mL/h via INTRAVENOUS

## 2021-10-05 NOTE — H&P (Signed)
Hannah Mcdonald [id number] 1956/02/05     HPI: Patient's father had colon cancer before age 65. For screening exam.  Plan for IV ampicillin prior to procedure secondary to prior bone marrow transplant.   Medications Prior to Admission  Medication Sig Dispense Refill Last Dose   acetaminophen (TYLENOL) 325 MG tablet Take 650 mg by mouth 2 (two) times a week. Premed for velcade. Taking Tuesdays and Friday   10/04/2021   acyclovir (ZOVIRAX) 400 MG tablet TAKE 1 TABLET BY MOUTH TWICE DAILY 60 tablet 5 10/04/2021   ALPRAZolam (XANAX) 0.25 MG tablet TAKE 1 TABLET BY MOUTH AT BEDTIME AS NEEDED FOR SLEEP 30 tablet 2 10/04/2021   ASPIRIN 81 PO Take 81 mg by mouth daily.   10/04/2021   atorvastatin (LIPITOR) 20 MG tablet Take 20 mg by mouth daily.   10/04/2021   busPIRone (BUSPAR) 30 MG tablet Take 30 mg by mouth 2 (two) times daily.   10/04/2021   CALCIUM-VITAMIN D PO Take by mouth 2 (two) times daily.   10/04/2021   cyclobenzaprine (FLEXERIL) 10 MG tablet TAKE 1 TABLET BY MOUTH AT BEDTIME 30 tablet 2 10/04/2021   dexamethasone (DECADRON) 4 MG tablet Take 5 tablets (20 mg total) by mouth once a week. 20 tablet 3 10/04/2021   diphenhydrAMINE (BENADRYL) 25 mg capsule Take 25 mg by mouth 2 (two) times a week. Pre med for velcade. Only Tuesdays and Fridays   10/04/2021   gabapentin (NEURONTIN) 300 MG capsule Take 300 mg by mouth at bedtime.   10/04/2021   lenalidomide (REVLIMID) 10 MG capsule TAKE 1 CAPSULE BY MOUTH 1 TIME A DAY FOR 21 DAYS ON THEN 7 DAYS OFF 21 capsule 0 10/04/2021   lidocaine-prilocaine (EMLA) cream APPLY 1 APPLICATION TOPICALLY AS NEEDED 30 g 2 10/04/2021   liraglutide (VICTOZA) 18 MG/3ML SOPN Inject 1.2 mg into the skin daily.   10/04/2021   Magnesium 400 MG TABS Take 1 tablet by mouth daily.   10/04/2021   meloxicam (MOBIC) 15 MG tablet Take 15 mg by mouth daily.   10/04/2021   methocarbamol (ROBAXIN) 500 MG tablet Take 1 tablet (500 mg total) by mouth every 6 (six) hours as needed for muscle spasms. 40 tablet 1  10/04/2021   metoprolol succinate (TOPROL-XL) 50 MG 24 hr tablet Take 50 mg by mouth. Take 1.5 tabs in am. Take 1 tablet at HS.   10/05/2021 at 0600   morphine (MS CONTIN) 15 MG 12 hr tablet TAKE 2 TABLETS BY MOUTH EVERY 12 HOURS 120 tablet 0 10/04/2021   Multiple Vitamin (MULTI-VITAMIN) tablet Take 1 tablet by mouth daily.   10/04/2021   omeprazole (PRILOSEC) 40 MG capsule Take 40 mg by mouth daily.   10/04/2021   ondansetron (ZOFRAN) 8 MG tablet Take 1 tablet (8 mg total) by mouth 2 (two) times daily as needed (Nausea or vomiting). 60 tablet 2 10/04/2021   oxyCODONE-acetaminophen (PERCOCET/ROXICET) 5-325 MG tablet TAKE 1-2 TABLETS BY MOUTH EVERY 4 HOURS AS NEEDED FOR SEVERE PAIN 90 tablet 0 10/04/2021   prochlorperazine (COMPAZINE) 10 MG tablet TAKE 1 TABLET BY MOUTH EVERY 6 HOURS AS NEEDED FOR NAUSEA OR VOMITING 60 tablet 2 10/04/2021   traMADol (ULTRAM) 50 MG tablet Take 1 tablet (50 mg total) by mouth every 6 (six) hours as needed. 60 tablet 1 10/04/2021   diphenoxylate-atropine (LOMOTIL) 2.5-0.025 MG tablet Take by mouth as needed. (Patient not taking: Reported on 08/02/2021)      No Known Allergies Past Medical History:  Diagnosis Date  Anxiety    Diabetes mellitus without complication ([place]) 4239   GERD (gastroesophageal reflux disease)    Hyperlipidemia    Myeloma (HCC)    Personal history of colonic polyps    Squamous cell carcinoma of skin 11/25/2013   Left chest. WD SCC with superficial infiltration.   Tachycardia    Past Surgical History:  Procedure Laterality Date   AUGMENTATION MAMMAPLASTY Bilateral 5320   silicone and replacement in 2001   [place], [place], 1998,2003, 2008, 2013   COLONOSCOPY WITH PROPOFOL N/A 08/16/2016   Procedure: COLONOSCOPY WITH PROPOFOL;  Surgeon: Robert Bellow, MD;  Location: [facility] ENDOSCOPY;  Service: Endoscopy;  Laterality: N/A;   DILATION AND CURETTAGE OF UTERUS     ENDOMETRIAL ABLATION   12/1991   ganglion cyst removal      IR IMAGING GUIDED PORT INSERTION  10/27/2020   IR IMAGING GUIDED PORT INSERTION  04/04/2021   KYPHOPLASTY N/A 10/28/2020   Procedure: T12 and L3 KYPHOPLASTY;  Surgeon: Hessie Knows, MD;  Location: [facility] ORS;  Service: Orthopedics;  Laterality: N/A;   TONSILLECTOMY     WRIST SURGERY Right 05/1995   Social History   Socioeconomic History   Marital status: Married    Spouse name: Not on file   Number of children: Not on file   Years of education: Not on file   Highest education level: Not on file  Occupational History   Not on file  Tobacco Use   Smoking status: Former    Packs/day: 1.00    Years: 4.00    Total pack years: 4.00    Types: Cigarettes    Quit date: 23    Years since quitting: 41.6   Smokeless tobacco: Never  Vaping Use   Vaping Use: Never used  Substance and Sexual Activity   Alcohol use: Yes    Alcohol/week: 1.0 - 2.0 standard drink of alcohol    Types: 1 - 2 Glasses of wine per week    Comment: occassionally   Drug use: No   Sexual activity: Not Currently  Other Topics Concern   Not on file  Social History Narrative   Not on file   Social Determinants of Health   Financial Resource Strain: Not on file  Food Insecurity: Not on file  Transportation Needs: Not on file  Physical Activity: Not on file  Stress: Not on file  Social Connections: Not on file  Intimate Partner Violence: Not on file   Social History   Social History Narrative   Not on file     ROS: Negative.     PE: HEENT: Negative. Lungs: Clear. Cardio: RR.  Assessment/Plan:  Proceed with planned endoscopy.   Hannah Mcdonald Hannah Mcdonald 10/05/2021

## 2021-10-05 NOTE — Anesthesia Preprocedure Evaluation (Signed)
Anesthesia Evaluation  Patient identified by MRN, date of birth, ID band Patient awake    Reviewed: Allergy & Precautions, NPO status , Patient's Chart, lab work & pertinent test results  History of Anesthesia Complications Negative for: history of anesthetic complications  Airway Mallampati: III  TM Distance: <3 FB Neck ROM: full    Dental  (+) Chipped, Poor Dentition   Pulmonary neg shortness of breath, former smoker,    Pulmonary exam normal        Cardiovascular Exercise Tolerance: Good (-) angina(-) Past MI and (-) DOE negative cardio ROS Normal cardiovascular exam     Neuro/Psych PSYCHIATRIC DISORDERS  Neuromuscular disease    GI/Hepatic Neg liver ROS, GERD  Controlled,  Endo/Other  diabetes, Type 2  Renal/GU negative Renal ROS  negative genitourinary   Musculoskeletal   Abdominal   Peds  Hematology negative hematology ROS (+)   Anesthesia Other Findings Past Medical History: No date: Anxiety 2016: Diabetes mellitus without complication (HCC) No date: GERD (gastroesophageal reflux disease) No date: Hyperlipidemia No date: Myeloma ([place]) No date: Personal history of colonic polyps 11/25/2013: Squamous cell carcinoma of skin     Comment:  Left chest. WD SCC with superficial infiltration. No date: Tachycardia  Past Surgical History: 1983: AUGMENTATION MAMMAPLASTY; Bilateral     Comment:  silicone and replacement in 2001 1983: [place]: [place], [id number], 2008, 2013: COLONOSCOPY 08/16/2016: COLONOSCOPY WITH PROPOFOL; N/A     Comment:  Procedure: COLONOSCOPY WITH PROPOFOL;  Surgeon: Robert Bellow, MD;  Location: [facility] ENDOSCOPY;  Service:               Endoscopy;  Laterality: N/A; No date: DILATION AND CURETTAGE OF UTERUS 12/1991: ENDOMETRIAL ABLATION No date: ganglion cyst removal  10/27/2020: IR IMAGING GUIDED PORT INSERTION 04/04/2021: IR  IMAGING GUIDED PORT INSERTION 10/28/2020: KYPHOPLASTY; N/A     Comment:  Procedure: T12 and L3 KYPHOPLASTY;  Surgeon: Hessie Knows, MD;  Location: [facility] ORS;  Service: Orthopedics;               Laterality: N/A; No date: TONSILLECTOMY 05/1995: WRIST SURGERY; Right  BMI    Body Mass Index: 21.73 kg/m      Reproductive/Obstetrics negative OB ROS                             Anesthesia Physical Anesthesia Plan  ASA: 3  Anesthesia Plan: General   Post-op Pain Management:    Induction: Intravenous  PONV Risk Score and Plan: Propofol infusion and TIVA  Airway Management Planned: Natural Airway and Nasal Cannula  Additional Equipment:   Intra-op Plan:   Post-operative Plan:   Informed Consent: I have reviewed the patients History and Physical, chart, labs and discussed the procedure including the risks, benefits and alternatives for the proposed anesthesia with the patient or authorized representative who has indicated his/her understanding and acceptance.     Dental Advisory Given  Plan Discussed with: Anesthesiologist, CRNA and Surgeon  Anesthesia Plan Comments: (Patient consented for risks of anesthesia including but not limited to:  - adverse reactions to medications - risk of airway placement if required - damage to eyes, teeth, lips or other oral mucosa - nerve damage due to positioning  - sore throat or hoarseness - Damage to  heart, brain, nerves, lungs, other parts of body or loss of life  Patient voiced understanding.)        Anesthesia Quick Evaluation

## 2021-10-05 NOTE — Transfer of Care (Signed)
Immediate Anesthesia Transfer of Care Note  Patient: Hannah Mcdonald  Procedure(s) Performed: COLONOSCOPY WITH PROPOFOL  Patient Location: PACU  Anesthesia Type:General  Level of Consciousness: awake, alert  and oriented  Airway & Oxygen Therapy: Patient Spontanous Breathing  Post-op Assessment: Report given to RN and Post -op Vital signs reviewed and stable  Post vital signs: Reviewed and stable    Last Vitals:  Vitals Value Taken Time  BP 85/45 10/05/21 0849  Temp 35.9 C 10/05/21 0849  Pulse 75 10/05/21 0850  Resp 20 10/05/21 0850  SpO2 99 % 10/05/21 0850  Vitals shown include unvalidated device data.  Last Pain:  Vitals:   10/05/21 0849  TempSrc: Temporal  PainSc:          Complications: No notable events documented.

## 2021-10-05 NOTE — Op Note (Signed)
[facility] Patient Name: Hannah Mcdonald Procedure Date: 10/05/2021 8:07 AM MRN: [id number] Account #: [id number] Date of Birth: 1955-10-14 Admit Type: Outpatient Age: 66 Room: [facility] ENDO ROOM 1 Gender: Female Note Status: Finalized Instrument Name: Peds Colonoscope [id number] Procedure:             Colonoscopy Indications:           Family history of colon cancer in a first-degree                         relative before age 60 years Providers:             Robert Bellow, MD Referring MD:          Robert Bellow, MD (Referring MD), [name],                         MD (Referring MD) Medicines:             Propofol per Anesthesia Complications:         No immediate complications. Procedure:             Pre-Anesthesia Assessment:                        - Prior to the procedure, a History and Physical was                         performed, and patient medications, allergies and                         sensitivities were reviewed. The patient's tolerance                         of previous anesthesia was reviewed.                        - The risks and benefits of the procedure and the                         sedation options and risks were discussed with the                         patient. All questions were answered and informed                         consent was obtained.                        After obtaining informed consent, the colonoscope was                         passed under direct vision. Throughout the procedure,                         the patient's blood pressure, pulse, and oxygen                         saturations were monitored continuously. The  Colonoscope was introduced through the anus and                         advanced to the the cecum, identified by appendiceal                         orifice and ileocecal valve. The colonoscopy was                         performed without difficulty. The  patient tolerated                         the procedure well. The quality of the bowel                         preparation was excellent. Findings:      The entire examined colon appeared normal on direct and retroflexion       views. Impression:            - The entire examined colon is normal on direct and                         retroflexion views.                        - No specimens collected. Recommendation:        - Repeat colonoscopy in 5 years for surveillance. Procedure Code(s):     --- Professional ---                        [phone], Colonoscopy, flexible; diagnostic, including                         collection of specimen(s) by brushing or washing, when                         performed (separate procedure) Diagnosis Code(s):     --- Professional ---                        Z80.0, Family history of malignant neoplasm of                         digestive organs CPT copyright 2019 [employer]. All rights reserved. The codes documented in this report are preliminary and upon coder review may  be revised to meet current compliance requirements. Robert Bellow, MD 10/05/2021 8:44:26 AM This report has been signed electronically. Number of Addenda: 0 Note Initiated On: 10/05/2021 8:07 AM Scope Withdrawal Time: 0 hours 7 minutes 23 seconds  Total Procedure Duration: 0 hours 12 minutes 29 seconds  Estimated Blood Loss:  Estimated blood loss: none.      [facility]

## 2021-10-05 NOTE — Anesthesia Postprocedure Evaluation (Signed)
Anesthesia Post Note  Patient: Hannah Mcdonald  Procedure(s) Performed: COLONOSCOPY WITH PROPOFOL  Patient location during evaluation: Endoscopy Anesthesia Type: General Level of consciousness: awake and alert Pain management: pain level controlled Vital Signs Assessment: post-procedure vital signs reviewed and stable Respiratory status: spontaneous breathing, nonlabored ventilation, respiratory function stable and patient connected to nasal cannula oxygen Cardiovascular status: blood pressure returned to baseline and stable Postop Assessment: no apparent nausea or vomiting Anesthetic complications: no   No notable events documented.   Last Vitals:  Vitals:   10/05/21 0859 10/05/21 0909  BP: 124/63 129/63  Pulse: 74 70  Resp: 20 16  Temp:    SpO2: 99% 100%    Last Pain:  Vitals:   10/05/21 0909  TempSrc:   PainSc: 0-No pain                 Precious Haws Atiyah Bauer

## 2021-10-05 NOTE — Anesthesia Procedure Notes (Signed)
Date/Time: 10/05/2021 8:25 AM  Performed by: Johnna Acosta, CRNAPre-anesthesia Checklist: Patient identified, Emergency Drugs available, Suction available, Patient being monitored and Timeout performed Patient Re-evaluated:Patient Re-evaluated prior to induction Oxygen Delivery Method: Nasal cannula Preoxygenation: Pre-oxygenation with 100% oxygen Induction Type: IV induction

## 2021-10-06 ENCOUNTER — Ambulatory Visit
Admission: RE | Admit: 2021-10-06 | Discharge: 2021-10-06 | Disposition: A | Discharge status: Home / Self Care | Payer: Medicare PPO | Source: Ambulatory Visit | Attending: Oncology | Admitting: Oncology

## 2021-10-06 ENCOUNTER — Encounter: Payer: Self-pay | Admitting: General Surgery

## 2021-10-06 ENCOUNTER — Inpatient Hospital Stay: Payer: Medicare PPO

## 2021-10-06 DIAGNOSIS — R200 Anesthesia of skin: Secondary | ICD-10-CM

## 2021-10-06 DIAGNOSIS — C9000 Multiple myeloma not having achieved remission: Secondary | ICD-10-CM | POA: Diagnosis present

## 2021-10-06 DIAGNOSIS — Z5112 Encounter for antineoplastic immunotherapy: Secondary | ICD-10-CM | POA: Diagnosis not present

## 2021-10-06 DIAGNOSIS — Z95828 Presence of other vascular implants and grafts: Secondary | ICD-10-CM

## 2021-10-06 DIAGNOSIS — R202 Paresthesia of skin: Secondary | ICD-10-CM | POA: Diagnosis present

## 2021-10-06 MED ORDER — GADOBUTROL 1 MMOL/ML IV SOLN
5.0000 mL | Freq: Once | INTRAVENOUS | Status: AC | PRN
  Administered 2021-10-06: 5 mL via INTRAVENOUS

## 2021-10-06 MED ORDER — HEPARIN SOD (PORK) LOCK FLUSH 100 UNIT/ML IV SOLN
500.0000 [IU] | Freq: Once | INTRAVENOUS | Status: AC
  Administered 2021-10-06: 500 [IU] via INTRAVENOUS
  Filled 2021-10-06: qty 5

## 2021-10-12 ENCOUNTER — Encounter (HOSPITAL_COMMUNITY): Payer: Self-pay | Admitting: Oncology

## 2021-10-12 ENCOUNTER — Other Ambulatory Visit: Payer: Self-pay | Admitting: *Deleted

## 2021-10-12 DIAGNOSIS — C9000 Multiple myeloma not having achieved remission: Secondary | ICD-10-CM

## 2021-10-12 MED ORDER — LENALIDOMIDE 10 MG PO CAPS: ORAL_CAPSULE | ORAL | 0 refills | Status: DC

## 2021-10-13 ENCOUNTER — Encounter (HOSPITAL_COMMUNITY): Payer: Self-pay | Admitting: Oncology

## 2021-10-13 ENCOUNTER — Other Ambulatory Visit: Payer: Self-pay

## 2021-10-14 ENCOUNTER — Other Ambulatory Visit: Payer: Self-pay | Admitting: Oncology

## 2021-10-14 DIAGNOSIS — C9000 Multiple myeloma not having achieved remission: Secondary | ICD-10-CM

## 2021-10-14 NOTE — Progress Notes (Signed)
ON PATHWAY REGIMEN - Multiple Myeloma and Other Plasma Cell Dyscrasias  No Change  Continue With Treatment as Ordered.  Original Decision Date/Time: 06/09/2021 18:02     Patient Characteristics: Multiple Myeloma, Newly Diagnosed, Transplant Eligible, Standard Risk Disease Classification: Multiple Myeloma R-ISS Staging: I Therapeutic Status: Newly Diagnosed Is Patient Eligible for Transplant<= Transplant Eligible Risk Status: Standard Risk Intent of Therapy: Curative Intent, Discussed with Patient

## 2021-10-15 ENCOUNTER — Other Ambulatory Visit: Payer: Self-pay

## 2021-10-22 ENCOUNTER — Other Ambulatory Visit: Payer: Self-pay

## 2021-10-24 NOTE — Progress Notes (Signed)
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  INTERVAL HISTORY: Patient returns to clinic today for further ration and consideration of cycle 9 of maintenance daratumumab and Revlimid.  She continues to have peripheral neuropathy in her bilateral feet, but otherwise is tolerating her treatments well. She has no other neurologic complaints.  She denies any recent fevers or illnesses.  She has no chest pain, shortness of breath, cough, or hemoptysis.  She denies any nausea, vomiting, constipation, or diarrhea.  She has no urinary complaints.  Patient offers no further specific complaints today.  REVIEW OF SYSTEMS:   Review of Systems  Constitutional: Negative.  Negative for fever, malaise/fatigue and weight loss.  Respiratory: Negative.  Negative for cough, hemoptysis and shortness of breath.   Cardiovascular:  Negative for chest pain and leg swelling.  Gastrointestinal: Negative.  Negative for abdominal pain, diarrhea and nausea.  Genitourinary: Negative.  Negative for dysuria.  Musculoskeletal:  Positive for back pain and neck pain. Negative for joint pain.  Skin: Negative.  Negative for rash.  Neurological:  Positive for sensory change. Negative for dizziness, tremors, focal weakness, weakness and headaches.  Psychiatric/Behavioral: Negative.  The patient is not nervous/anxious.     As per HPI. Otherwise, a complete review of systems is negative.  PAST MEDICAL HISTORY: Past Medical History:  Diagnosis Date   Anxiety    Diabetes mellitus without complication ([place]) 6720   GERD (gastroesophageal reflux disease)    Hyperlipidemia    Myeloma ([place])    Myeloma ([place])    Personal  history of colonic polyps    Squamous cell carcinoma of skin 11/25/2013   Left chest. WD SCC with superficial infiltration.   Tachycardia     PAST SURGICAL HISTORY: Past Surgical History:  Procedure Laterality Date   AUGMENTATION MAMMAPLASTY Bilateral 9470   silicone and replacement in 2001   [place], [place], 1998,2003, 2008, 2013   COLONOSCOPY WITH PROPOFOL N/A 08/16/2016   Procedure: COLONOSCOPY WITH PROPOFOL;  Surgeon: Robert Bellow, MD;  Location: [facility] END[facility]COPY;  Service: Endoscopy;  Laterality: N/A;   COLONOSCOPY WITH PROPOFOL N/A 10/05/2021   Procedure: COLONOSCOPY WITH PROPOFOL;  Surgeon: Robert Bellow, MD;  Location: [facility] ENDOSCOPY;  Service: Endoscopy;  Laterality: N/A;  HAS A PORT, NEEDS AMPICILLIN PER OFFICE   DILATION AND CURETTAGE OF UTERUS     ENDOMETRIAL ABLATION  12/1991   ganglion cyst removal      IR IMAGING GUIDED PORT INSERTION  10/27/2020   IR IMAGING GUIDED PORT INSERTION  04/04/2021   KYPHOPLASTY N/A 10/28/2020   Procedure: T12 and L3 KYPHOPLASTY;  Surgeon: Hessie Knows, MD;  Location: [facility] ORS;  Service: Orthopedics;  Laterality: N/A;   TONSILLECTOMY     WRIST SURGERY Right 05/1995    FAMILY HISTORY: Family History  Problem Relation Age of Onset   Colon polyps Sister    Colon cancer Father 90   Diabetes Mother    Breast cancer Neg Hx     ADVANCED DIRECTIVES (Y/N):  N  HEALTH MAINTENANCE: Social History   Tobacco Use   Smoking status: Former    Packs/day: 1.00    Years:  4.00    Total pack years: 4.00    Types: Cigarettes    Quit date: 48    Years since quitting: 41.6   Smokeless tobacco: Never  Vaping Use   Vaping Use: Never used  Substance Use Topics   Alcohol use: Yes    Alcohol/week: 1.0 - 2.0 standard drink of alcohol    Types: 1 - 2 Glasses of wine per week    Comment: occassionally   Drug use: No     Colonoscopy:  PAP:  Bone density:  Lipid  panel:  No Known Allergies  Current Outpatient Medications  Medication Sig Dispense Refill   acetaminophen (TYLENOL) 325 MG tablet Take 650 mg by mouth 2 (two) times a week. Premed for velcade. Taking Tuesdays and Friday     acyclovir (ZOVIRAX) 400 MG tablet TAKE 1 TABLET BY MOUTH TWICE DAILY 60 tablet 5   ALPRAZolam (XANAX) 0.25 MG tablet TAKE 1 TABLET BY MOUTH AT BEDTIME AS NEEDED FOR SLEEP 30 tablet 2   ASPIRIN 81 PO Take 81 mg by mouth daily.     atorvastatin (LIPITOR) 20 MG tablet Take 20 mg by mouth daily.     busPIRone (BUSPAR) 30 MG tablet Take 30 mg by mouth 2 (two) times daily.     CALCIUM-VITAMIN D PO Take by mouth 2 (two) times daily.     cyclobenzaprine (FLEXERIL) 10 MG tablet TAKE 1 TABLET BY MOUTH AT BEDTIME 30 tablet 2   dexamethasone (DECADRON) 4 MG tablet Take 5 tablets (20 mg total) by mouth once a week. 20 tablet 3   diphenhydrAMINE (BENADRYL) 25 mg capsule Take 25 mg by mouth 2 (two) times a week. Pre med for velcade. Only Tuesdays and Fridays     lenalidomide (REVLIMID) 10 MG capsule TAKE 1 CAPSULE BY MOUTH 1 TIME A DAY FOR 21 DAYS ON THEN 7 DAYS OFF 21 capsule 0   lidocaine-prilocaine (EMLA) cream APPLY 1 APPLICATION TOPICALLY AS NEEDED 30 g 2   liraglutide (VICTOZA) 18 MG/3ML SOPN Inject 1.2 mg into the skin daily.     Magnesium 400 MG TABS Take 1 tablet by mouth daily.     meloxicam (MOBIC) 15 MG tablet Take 15 mg by mouth daily.     methocarbamol (ROBAXIN) 500 MG tablet Take 1 tablet (500 mg total) by mouth every 6 (six) hours as needed for muscle spasms. 40 tablet 1   metoprolol succinate (TOPROL-XL) 50 MG 24 hr tablet Take 50 mg by mouth. Take 1.5 tabs in am. Take 1 tablet at HS.     Multiple Vitamin (MULTI-VITAMIN) tablet Take 1 tablet by mouth daily.     omeprazole (PRILOSEC) 40 MG capsule Take 40 mg by mouth daily.     ondansetron (ZOFRAN) 8 MG tablet Take 1 tablet (8 mg total) by mouth 2 (two) times daily as needed (Nausea or vomiting). 60 tablet 2    prochlorperazine (COMPAZINE) 10 MG tablet TAKE 1 TABLET BY MOUTH EVERY 6 HOURS AS NEEDED FOR NAUSEA OR VOMITING 60 tablet 2   traMADol (ULTRAM) 50 MG tablet Take 1 tablet (50 mg total) by mouth every 6 (six) hours as needed. 60 tablet 1   diphenoxylate-atropine (LOMOTIL) 2.5-0.025 MG tablet Take by mouth as needed. (Patient not taking: Reported on 08/02/2021)     gabapentin (NEURONTIN) 300 MG capsule Take 1 capsule (300 mg total) by mouth 2 (two) times daily. 60 capsule 2   oxyCODONE-acetaminophen (PERCOCET/ROXICET) 5-325 MG tablet TAKE 1-2 TABLETS BY MOUTH EVERY 4 HOURS  AS NEEDED FOR SEVERE PAIN 90 tablet 0   No current facility-administered medications for this visit.   Facility-Administered Medications Ordered in Other Visits  Medication Dose Route Frequency Provider Last Rate Last Admin   0.9 %  sodium chloride infusion   Intravenous Continuous Lloyd Huger, MD   Stopped at 04/07/21 1142   sodium chloride flush (NS) 0.9 % injection 10 mL  10 mL Intravenous PRN Lloyd Huger, MD   10 mL at 11/23/20 0932    OBJECTIVE: Vitals:   10/25/21 0900  BP: (!) 107/53  Pulse: 80  Resp: 16  Temp: (!) 96.4 F (35.8 C)  SpO2: 100%     Body mass index is 23.79 kg/m.    ECOG FS:0 - Asymptomatic  General: Well-developed, well-nourished, no acute distress. Eyes: Pink conjunctiva, anicteric sclera. HEENT: Normocephalic, moist mucous membranes. Lungs: No audible wheezing or coughing. Heart: Regular rate and rhythm. Abdomen: Soft, nontender, no obvious distention. Musculoskeletal: No edema, cyanosis, or clubbing. Neuro: Alert, answering all questions appropriately. Cranial nerves grossly intact. Skin: No rashes or petechiae noted. Psych: Normal affect.   LAB RESULTS:  Lab Results  Component Value Date   NA 135 10/25/2021   K 3.9 10/25/2021   CL 105 10/25/2021   CO2 26 10/25/2021   GLUCOSE 146 (H) 10/25/2021   BUN 26 (H) 10/25/2021   CREATININE 1.24 (H) 10/25/2021   CALCIUM  8.5 (L) 10/25/2021   PROT 6.3 (L) 10/25/2021   ALBUMIN 3.8 10/25/2021   AST 33 10/25/2021   ALT 39 10/25/2021   ALKPHOS 103 10/25/2021   BILITOT 0.5 10/25/2021   GFRNONAA 48 (L) 10/25/2021    Lab Results  Component Value Date   WBC 3.6 (L) 10/25/2021   NEUTROABS 2.0 10/25/2021   HGB 11.4 (L) 10/25/2021   HCT 35.0 (L) 10/25/2021   MCV 99.2 10/25/2021   PLT 137 (L) 10/25/2021     STUDIES: MR Lumbar Spine W Wo Contrast  Result Date: 10/07/2021 CLINICAL DATA:  Initial evaluation for numbness and tingling in feet and legs, lower back pain. History of multiple myeloma. Prior compression fractures. EXAM: MRI LUMBAR SPINE WITHOUT AND WITH CONTRAST TECHNIQUE: Multiplanar and multiecho pulse sequences of the lumbar spine were obtained without and with intravenous contrast. CONTRAST:  78m GADAVIST GADOBUTROL 1 MMOL/ML IV SOLN COMPARISON:  Comparison made with previous MRI from 10/19/2020. FINDINGS: Segmentation: Standard. Lowest well-formed disc space labeled the L5-S1 level. Alignment: 4 mm anterolisthesis of L4 on L5, with additional trace anterolisthesis of L2 on L3 and L3 on L4, similar to prior. Vertebrae: Prior compression deformities involving the T12 and L3 vertebral bodies with sequelae of prior vertebral augmentation noted. Increased marrow edema and enhancement noted at the T12 level, suggesting a possible superimposed acute to subacute component, although height loss is relatively similar as compared to most recent thoracic MRI. Acute to subacute compression fracture noted at the superior endplate of TN05with no more than mild central height loss without bony retropulsion. Acute to subacute compression fractures seen at the superior endplate of L1 with up to 25% height loss and trace 3 mm bony retropulsion. Additional subtle marrow edema enhancement present at the superior endplate of L4, consistent with a subacute compression fracture. Height loss measures no more than 15% with trace 2 mm  bony retropulsion. Otherwise, vertebral body height maintained. Previously seen lesions related to multiple myeloma are overall improved as compared to previous MRI. A single 9 mm stir hyperintense enhancing lesion is seen within the  right sacrum (series 3, image 2). Abnormal lesion involving the right iliac wing noted, partially visualized (series 5, image 33). Additional tiny lesion involving the T10 spinous process noted, described on corresponding thoracic spine MRI. No other significant active disease seen within the lumbar spine. Conus medullaris and cauda equina: Conus extends to the L1-2 level. Conus and cauda equina appear normal. No epidural tumor or abnormal enhancement. Paraspinal and other soft tissues: Edema and enhancement within the posterior paraspinous soft tissues at the level of L3, most likely related to prior vertebral augmentation at this level. Paraspinous soft tissues demonstrate no other acute finding. Visualized visceral structures within normal limits. Disc levels: T12-L1: 3 mm bony retropulsion related to the L1 compression fracture. Mild facet hypertrophy. No significant spinal stenosis. Foramina remain patent. L1-2: Minimal disc bulge. Mild facet hypertrophy. No significant spinal stenosis. Foramina remain patent. L2-3: Mild disc bulge with disc desiccation. Mild facet and ligament flavum hypertrophy. No significant spinal stenosis. Foramina remain patent. L3-4: Mild disc bulge with disc desiccation. Superimposed left foraminal to extraforaminal disc protrusion closely approximates the exiting left L3 nerve root (series 2, image 11). Moderate facet hypertrophy. Resultant mild canal with left lateral recess stenosis. Mild left L3 foraminal narrowing. Right neural foramen remains patent. L4-5: Anterolisthesis. Diffuse disc bulge with disc desiccation and reactive endplate spurring, worse on the right. Moderate facet and ligament flavum hypertrophy with associated trace joint effusions.  Resultant mild-to-moderate spinal stenosis with bilateral lateral recess narrowing. Moderate right with mild to moderate left L4 foraminal narrowing. L5-S1: Minimal disc bulge with disc desiccation. Mild right worse than left facet hypertrophy. No significant spinal stenosis. Foramina remain patent. IMPRESSION: 1. Acute to subacute compression fractures involving the superior endplates of O16, L1, and L4 as above. Height loss is maximal at L1 measuring up to 25% with 3 mm bony retropulsion. No significant stenosis. 2. Prior compression deformities with sequelae of vertebral augmentation involving the T12 and L3 vertebral bodies. Increased marrow edema and enhancement about the T12 fracture suggesting superimposed acute to subacute component, although height loss is relatively stable as compared to most recent thoracic MRI from 05/26/2021. 3. Improved appearance of multiple myeloma involving the lumbar spine since 10/19/2020. Dominant lesion involving the right iliac wing, partially visualized. No extra osseous or epidural tumor. 4. Multilevel degenerative spondylosis as above, most pronounced at L4-5 were there is resultant mild to moderate spinal stenosis, with moderate right and mild to moderate left L4 foraminal narrowing. 5. Left foraminal to extraforaminal disc protrusion at L3-4, closely approximating and potentially affecting the exiting left L3 nerve root. Electronically Signed   By: Jeannine Boga M.D.   On: 10/07/2021 07:58   MR Thoracic Spine W Wo Contrast  Result Date: 10/07/2021 CLINICAL DATA:  Initial evaluation for trunk numbness, tingling in the. History of compression fracture, multiple myeloma. EXAM: MRI THORACIC WITHOUT AND WITH CONTRAST TECHNIQUE: Multiplanar and multiecho pulse sequences of the thoracic spine were obtained without and with intravenous contrast. CONTRAST:  45m GADAVIST GADOBUTROL 1 MMOL/ML IV SOLN COMPARISON:  Comparison made with previous MRI from 05/26/2021. FINDINGS:  Alignment: Stable alignment with straightening of the normal thoracic kyphosis. No interval listhesis. Vertebrae: Previously seen lesions related to patient history of multiple myeloma again seen. The lesion involving the T10 vertebral body is little interval changed measuring approximately 7 mm, previously 6 mm. An additional 4 mm lesion within the T10 spinous process is similar as well (series 24, image 11) T8. 4 mm lesion involving the L1 vertebral body  is also grossly similar. 5 mm lesion involving the left pedicle of T7 is little interval changed (series 24, image 13). In contrast, a lesion involving the T8 vertebral body appears somewhat increased in size now measuring up to 1.2 cm, previously 6 mm (series 24, image 10). An additional lesion involving the left posterior elements at T4 is also increased in conspicuity measuring 1.3 cm (series 24, image 15). No extra osseous or epidural extension of tumor. Multiple acute to subacute compression fractures are seen involving the thoracic spine. These involve the superior endplates of T2, T3, T4, T5, T8, T10, T11, and L1, as well as the inferior endplate of T9. These are best appreciated on sagittal STIR sequence (series 19, image 9). Height loss is maximal at the level of T5 measuring up to approximately 50%. No significant bony retropulsion. Prior T12 fracture with sequelae of prior vertebral augmentation noted. Residual marrow edema and enhancement seen at this level, although height loss is relatively stable. Additional sequelae of prior vertebral augmentation noted at the partially visualized L3 vertebral body as well. Cord: Normal signal and morphology. No epidural tumor or abnormal enhancement. Paraspinal and other soft tissues: Paraspinous soft tissues within normal limits. Mild irregular thickening noted about the visualized adrenal glands. Disc levels: Mild scattered disc desiccation with reactive endplate change noted within the thoracic spine,  relatively similar to previous. Mild bony retropulsion at T12 and L1 related to the compression fractures, but without significant spinal stenosis. No other significant spinal stenosis elsewhere within the thoracic spine. Foramina remain patent. No overt neural impingement. IMPRESSION: 1. Multiple acute to subacute compression fractures involving the T2, T3, T4, T5, T8, T9, T10, T11, and L1 vertebral bodies. Height loss is maximal at the level of T5 measuring up to 50%. No significant bony retropulsion or stenosis about these fractures. 2. Additional T12 compression fracture with sequelae of prior vertebral augmentation. Increased marrow edema enhancement about this fracture since previous, suggesting a superimposed acute to subacute component. 3. Few scattered enhancing lesions involving the thoracic spine as above, consistent with history of multiple myeloma. Two of these lesions involving T4 and T8 levels are slightly increased in size and/or conspicuity as compared to previous. No extra osseous or epidural extension. 4. Underlying mild multilevel thoracic spondylosis without significant stenosis or overt neural impingement, relatively similar to previous. Electronically Signed   By: Jeannine Boga M.D.   On: 10/07/2021 07:30   MR Cervical Spine W Wo Contrast  Result Date: 10/06/2021 CLINICAL DATA:  Initial evaluation for numbness and tingling and neck in the legs, and feet, left arm numbness and tingling. History of multiple myeloma. EXAM: MRI CERVICAL SPINE WITHOUT AND WITH CONTRAST TECHNIQUE: Multiplanar and multiecho pulse sequences of the cervical spine, to include the craniocervical junction and cervicothoracic junction, were obtained without and with intravenous contrast. CONTRAST:  25m GADAVIST GADOBUTROL 1 MMOL/ML IV SOLN COMPARISON:  Comparison made with MRI of the thoracic spine performed on the same day. He FINDINGS: Alignment: Straightening of the normal cervical lordosis. Degenerative  grade 1 anterolisthesis of C3 on C4 through C5 on C6. Vertebrae: Acute to subacute compression fractures with associated marrow edema and enhancement seen involving the superior endplates of T2, T3, T4, and T5, better characterized on corresponding MRI of the thoracic spine. No significant bony retropulsion. No acute or subacute fracture within the cervical spine itself. Underlying bone marrow signal intensity within normal limits. No MRI evidence for active multiple myeloma within the cervical spine. No other worrisome osseous lesions.  Prominent reactive marrow edema present about the C5-6 facets due to facet arthritis. Cord: Normal signal and morphology. No epidural tumor or abnormal enhancement. Posterior Fossa, vertebral arteries, paraspinal tissues: Visualized brain and posterior fossa within normal limits. Craniocervical junction normal. Paraspinous and prevertebral soft tissues within normal limits. No extra osseous tumor. Normal flow voids seen within the vertebral arteries bilaterally. Disc levels: C2-C3: Normal interspace. Left-sided facet hypertrophy. No canal or foraminal stenosis. C3-C4: Trace anterolisthesis with mild disc bulge. Moderate left with mild right facet hypertrophy. No spinal stenosis. Moderate left C4 foraminal stenosis. Right neural foramen remains patent. C4-C5: Anterolisthesis with mild intervertebral disc space narrowing. Diffuse disc bulge with bilateral uncovertebral spurring. Moderate left-sided facet arthrosis. Mild spinal stenosis without frank cord impingement. Moderate left C5 foraminal narrowing. Right neural foramen remains patent. C5-C6: Anterolisthesis. Mild disc bulge with bilateral uncovertebral spurring. Moderate left worse than right facet arthrosis. No more than mild spinal stenosis. Moderate left with mild right C6 foraminal narrowing. C6-C7: Mild intervertebral disc space narrowing with diffuse disc bulge. Associated endplate and uncovertebral spurring. Flattening and  partial effacement of the ventral thecal sac with borderline mild spinal stenosis. Mild bilateral C7 foraminal narrowing. C7-T1: Disc bulge with endplate and uncovertebral spurring. Small chronic endplate Schmorl's node deformities. Mild facet hypertrophy. No spinal stenosis. Foramina remain patent. IMPRESSION: 1. Acute to subacute compression fractures involving the superior endplates of T2, T3, T4, and T5, better characterized on corresponding MRI of the thoracic spine. No significant bony retropulsion. 2. No MRI evidence for active multiple myeloma within the cervical spine. 3. Multilevel cervical spondylosis with resultant mild spinal stenosis at C4-5 and C5-6. Moderate left C4 through C6 foraminal narrowing related to disc bulging, uncovertebral and facet disease. 4. Prominent reactive marrow edema about the bilateral C5-6 facets due to facet arthritis. Findings could contribute to neck pain. Electronically Signed   By: Jeannine Boga M.D.   On: 10/06/2021 22:38   CT BONE MARROW BIOPSY & ASPIRATION  Result Date: 10/03/2021 INDICATION: multiple myeloma EXAM: CT BONE MARROW BIOPSY AND ASPIRATION MEDICATIONS: None. ANESTHESIA/SEDATION: Moderate (conscious) sedation was employed during this procedure. A total of Versed 2 mg and Fentanyl 100 mcg was administered intravenously. Moderate Sedation Time: 12 minutes. The patient's level of consciousness and vital signs were monitored continuously by radiology nursing throughout the procedure under my direct supervision. FLUOROSCOPY TIME:  N/a COMPLICATIONS: None immediate. PROCEDURE: Informed written consent was obtained from the patient after a thorough discussion of the procedural risks, benefits and alternatives. All questions were addressed. Maximal Sterile Barrier Technique was utilized including caps, mask, sterile gowns, sterile gloves, sterile drape, hand hygiene and skin antiseptic. A timeout was performed prior to the initiation of the procedure. The  patient was placed prone on the CT exam table. Limited CT of the pelvis was performed for planning purposes. Skin entry site was marked, and the overlying skin was prepped and draped in the standard sterile fashion. Local analgesia was obtained with 1% lidocaine. Using CT guidance, an 11 gauge needle was advanced just deep to the cortex of the right posterior ilium. Subsequently, bone marrow aspiration and core biopsy were performed. Specimens were submitted to lab/pathology for handling. Hemostasis was achieved with manual pressure, and a clean dressing was placed. The patient tolerated the procedure well without immediate complication. IMPRESSION: Successful CT-guided bone marrow aspiration and core biopsy of the right posterior ilium. Electronically Signed   By: Albin Felling M.D.   On: 10/03/2021 12:27    ONCOLOGY HISTORY:  Diagnosis confirmed from bone biopsy on August 17, 2020.  Initially her lambda free light chains were elevated at 432.1, but now are within normal limits at 12.1.  Immunoglobulins and SPEP are normal.  Bone marrow biopsy completed on August 26, 2020 revealed 25% plasma cells along with the deletion of 1p which is considered poor prognosis.  PET scan results from September 06, 2020 reviewed independently with 3 hypermetabolic lesions in right C5-6 facets, right iliac crest lesion, and L3 vertebral lesion.  After lengthy discussion with the patient, she agreed to pursue chemotherapy with daratumumab, Velcade, Revlimid, and dexamethasone followed by autologous bone marrow transplant.  Patient received weekly daratumumab for 4 cycles along with Velcade on days 1, 4, 8, and 11.  She also received Revlimid on days 1 through 14 of a 21-day cycle.  She completed cycle 4 of treatment on January 14, 2021.  Subsequent bone marrow biopsy on January 24, 2021 revealed complete remission with no evidence of myeloma.  Patient underwent autologous stem cell transplant at [facility] on February 24, 2021.  She  was readmitted to the hospital on March 04, 2021 with neutropenic fever, diarrhea, and sepsis-like symptoms.  Infectious disease work-up remains negative.   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  PLAN:      1.  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  She received consolidation with cycle 5 and 6 using daratumumab on day 1, Velcade on day 1, 4, 8, and 11, Revlimid 10 mg on days 1 through 14 with 7 days off, and weekly dexamethasone on a 21-day cycle.  She is now on maintenance daratumumab on day 1 and Revlimid 10 mg on days 1 through 21 on a 28-day cycle for cycles 7 through 32.  Repeat bone marrow biopsy on October 03, 2021 revealed no evidence of a myeloid neoplasm.  Proceed with cycle 9 of treatment today.  Patient takes all of her premedications at home prior to clinic, therefore these have been removed from the treatment plan.  Patient will also receive Zometa today.  Return to clinic in 4 weeks for further evaluation and consideration of cycle 10.   2.  Pain: She has completed XRT.  She had kyphoplasty x2.  Continue Percocet and tramadol as needed.  Repeat MRI results from October 06, 2021 reviewed independently and reported as above.  Patient was given a referral back to orthopedics for further evaluation and treatment.   3.  Hypercalcemia: Resolved, patient now has a mild hypocalcemia.  Zometa as above.   4.  Renal insufficiency: Mild.  Patient's most recent creatinine is 1.24. 5.  Anemia: Chronic and unchanged.  Patient's hemoglobin is 11.4 today. 6.  Thrombocytopenia: Mild, chronic and unchanged.  Patient's platelet count is 137 today. 7.  Vaccinations: Continue revaccination schedule as per [facility]. 8.  Transaminitis: Resolved.  Patient expressed understanding and was in agreement with this plan. She also understands that She can call clinic at any time with any questions,  concerns, or complaints.      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

## 2021-10-25 ENCOUNTER — Other Ambulatory Visit: Payer: Self-pay | Admitting: *Deleted

## 2021-10-25 ENCOUNTER — Inpatient Hospital Stay: Payer: Medicare PPO

## 2021-10-25 ENCOUNTER — Encounter: Payer: Self-pay | Admitting: Oncology

## 2021-10-25 ENCOUNTER — Inpatient Hospital Stay (HOSPITAL_BASED_OUTPATIENT_CLINIC_OR_DEPARTMENT_OTHER): Payer: Medicare PPO | Admitting: Oncology

## 2021-10-25 VITALS — BP 95/52 | HR 86 | Temp 96.2°F | Resp 16

## 2021-10-25 VITALS — BP 107/53 | HR 80 | Temp 96.4°F | Resp 16 | Wt 125.9 lb

## 2021-10-25 DIAGNOSIS — C9000 Multiple myeloma not having achieved remission: Secondary | ICD-10-CM | POA: Diagnosis not present

## 2021-10-25 DIAGNOSIS — Z5112 Encounter for antineoplastic immunotherapy: Secondary | ICD-10-CM | POA: Diagnosis not present

## 2021-10-25 DIAGNOSIS — M549 Dorsalgia, unspecified: Secondary | ICD-10-CM

## 2021-10-25 LAB — CBC WITH DIFFERENTIAL/PLATELET
Abs Immature Granulocytes: 0.01 10*3/uL (ref 0.00–0.07)
Basophils Absolute: 0.1 10*3/uL (ref 0.0–0.1)
Basophils Relative: 2 %
Eosinophils Absolute: 0.2 10*3/uL (ref 0.0–0.5)
Eosinophils Relative: 7 %
HCT: 35 % — ABNORMAL LOW (ref 36.0–46.0)
Hemoglobin: 11.4 g/dL — ABNORMAL LOW (ref 12.0–15.0)
Immature Granulocytes: 0 %
Lymphocytes Relative: 21 %
Lymphs Abs: 0.8 10*3/uL (ref 0.7–4.0)
MCH: 32.3 pg (ref 26.0–34.0)
MCHC: 32.6 g/dL (ref 30.0–36.0)
MCV: 99.2 fL (ref 80.0–100.0)
Monocytes Absolute: 0.5 10*3/uL (ref 0.1–1.0)
Monocytes Relative: 14 %
Neutro Abs: 2 10*3/uL (ref 1.7–7.7)
Neutrophils Relative %: 56 %
Platelets: 137 10*3/uL — ABNORMAL LOW (ref 150–400)
RBC: 3.53 MIL/uL — ABNORMAL LOW (ref 3.87–5.11)
RDW: 13.7 % (ref 11.5–15.5)
WBC: 3.6 10*3/uL — ABNORMAL LOW (ref 4.0–10.5)
nRBC: 0 % (ref 0.0–0.2)

## 2021-10-25 LAB — COMPREHENSIVE METABOLIC PANEL
ALT: 39 U/L (ref 0–44)
AST: 33 U/L (ref 15–41)
Albumin: 3.8 g/dL (ref 3.5–5.0)
Alkaline Phosphatase: 103 U/L (ref 38–126)
Anion gap: 4 — ABNORMAL LOW (ref 5–15)
BUN: 26 mg/dL — ABNORMAL HIGH (ref 8–23)
CO2: 26 mmol/L (ref 22–32)
Calcium: 8.5 mg/dL — ABNORMAL LOW (ref 8.9–10.3)
Chloride: 105 mmol/L (ref 98–111)
Creatinine, Ser: 1.24 mg/dL — ABNORMAL HIGH (ref 0.44–1.00)
GFR, Estimated: 48 mL/min — ABNORMAL LOW (ref >60)
Glucose, Bld: 146 mg/dL — ABNORMAL HIGH (ref 70–99)
Potassium: 3.9 mmol/L (ref 3.5–5.1)
Sodium: 135 mmol/L (ref 135–145)
Total Bilirubin: 0.5 mg/dL (ref 0.3–1.2)
Total Protein: 6.3 g/dL — ABNORMAL LOW (ref 6.5–8.1)

## 2021-10-25 MED ORDER — SODIUM CHLORIDE 0.9 % IV SOLN
Freq: Once | INTRAVENOUS | Status: AC
  Filled 2021-10-25: qty 250

## 2021-10-25 MED ORDER — DIPHENHYDRAMINE HCL 25 MG PO CAPS: 25.0000 mg | ORAL_CAPSULE | Freq: Once | ORAL | Status: DC

## 2021-10-25 MED ORDER — DARATUMUMAB-HYALURONIDASE-FIHJ 1800-30000 MG-UT/15ML ~~LOC~~ SOLN
1800.0000 mg | Freq: Once | SUBCUTANEOUS | Status: AC
  Administered 2021-10-25: 1800 mg via SUBCUTANEOUS
  Filled 2021-10-25: qty 15

## 2021-10-25 MED ORDER — DEXAMETHASONE 4 MG PO TABS: 20.0000 mg | ORAL_TABLET | Freq: Once | ORAL | Status: DC

## 2021-10-25 MED ORDER — HEPARIN SOD (PORK) LOCK FLUSH 100 UNIT/ML IV SOLN
500.0000 [IU] | Freq: Once | INTRAVENOUS | Status: AC | PRN
  Administered 2021-10-25: 500 [IU]
  Filled 2021-10-25: qty 5

## 2021-10-25 MED ORDER — ZOLEDRONIC ACID 4 MG/5ML IV CONC
3.3000 mg | Freq: Once | INTRAVENOUS | Status: AC
  Administered 2021-10-25: 3.3 mg via INTRAVENOUS
  Filled 2021-10-25: qty 4.13

## 2021-10-25 MED ORDER — GABAPENTIN 300 MG PO CAPS: 300.0000 mg | ORAL_CAPSULE | Freq: Two times a day (BID) | ORAL | 2 refills | Status: DC

## 2021-10-25 MED ORDER — ACETAMINOPHEN 325 MG PO TABS: 650.0000 mg | ORAL_TABLET | Freq: Once | ORAL | Status: DC

## 2021-10-25 MED ORDER — OXYCODONE-ACETAMINOPHEN 5-325 MG PO TABS: ORAL_TABLET | ORAL | 0 refills | Status: DC

## 2021-10-25 NOTE — Progress Notes (Signed)
Pt reports taking 650 mg PO Tylenol, 25 MG PO Benadryl, 20 MG PO Decadron at home at approx 7am. Per Geselle Cardosa S. CMA Per Dr. Grayland Ormond okay to hold pre-meds here and proceed with Darzalex Faspro injection and no monitoring needed post Darzalex Faspro injection.   Creatinine 1.24, Calcium 8.5, Per Tulani Kidney S. CMA per Dr. Grayland Ormond okay to proceed with Zometa,

## 2021-10-25 NOTE — Patient Instructions (Addendum)
[facility] CANCER CTR AT [place]  Discharge Instructions: Thank you for choosing [facility] to provide your oncology and hematology care.  If you have a lab appointment with the [facility], please go directly to the [facility] and check in at the registration area.  Wear comfortable clothing and clothing appropriate for easy access to any Portacath or PICC line.   We strive to give you quality time with your provider. You may need to reschedule your appointment if you arrive late (15 or more minutes).  Arriving late affects you and other patients whose appointments are after yours.  Also, if you miss three or more appointments without notifying the office, you may be dismissed from the clinic at the provider's discretion.      For prescription refill requests, have your pharmacy contact our office and allow 72 hours for refills to be completed.    Today you received the following chemotherapy and/or immunotherapy agents Darzalex   Faspro   To help prevent nausea and vomiting after your treatment, we encourage you to take your nausea medication as directed.  BELOW ARE SYMPTOMS THAT SHOULD BE REPORTED IMMEDIATELY: *FEVER GREATER THAN 100.4 F (38 C) OR HIGHER *CHILLS OR SWEATING *NAUSEA AND VOMITING THAT IS NOT CONTROLLED WITH YOUR NAUSEA MEDICATION *UNUSUAL SHORTNESS OF BREATH *UNUSUAL BRUISING OR BLEEDING *URINARY PROBLEMS (pain or burning when urinating, or frequent urination) *BOWEL PROBLEMS (unusual diarrhea, constipation, pain near the anus) TENDERNESS IN MOUTH AND THROAT WITH OR WITHOUT PRESENCE OF ULCERS (sore throat, sores in mouth, or a toothache) UNUSUAL RASH, SWELLING OR PAIN  UNUSUAL VAGINAL DISCHARGE OR ITCHING   Items with * indicate a potential emergency and should be followed up as soon as possible or go to the Emergency Department if any problems should occur.  Please show the CHEMOTHERAPY ALERT CARD or IMMUNOTHERAPY ALERT CARD at check-in  to the Emergency Department and triage nurse.  Should you have questions after your visit or need to cancel or reschedule your appointment, please contact [facility]CER [place] AT [place]  [phone] and follow the prompts.  Office hours are 8:00 a.m. to 4:30 p.m. Monday - Friday. Please note that voicemails left after 4:00 p.m. may not be returned until the following business day.  We are closed weekends and major holidays. You have access to a nurse at all times for urgent questions. Please call the main number to the clinic [phone] and follow the prompts.  For any non-urgent questions, you may also contact your provider using MyChart. We now offer e-Visits for anyone 71 and older to request care online for non-urgent symptoms. For details visit [email or web address].   Also download the MyChart app! Go to the app store, search "MyChart", open the app, select [facility], and log in with your MyChart username and password.  Masks are optional in the cancer centers. If you would like for your care team to wear a mask while they are taking care of you, please let them know. For doctor visits, patients may have with them one support person who is at least 65 years old. At this time, visitors are not allowed in the infusion area.    Zoledronic Acid Injection (Cancer) What is this medication? ZOLEDRONIC ACID (ZOE le dron ik AS id) treats high calcium levels in the blood caused by cancer. It may also be used with chemotherapy to treat weakened bones caused by cancer. It works by slowing down the release of calcium from bones. This lowers calcium  levels in your blood. It also makes your bones stronger and less likely to break (fracture). It belongs to a group of medications called bisphosphonates. This medicine may be used for other purposes; ask your health care provider or pharmacist if you have questions. COMMON BRAND NAME(S): Zometa, Zometa Powder What should I tell my care team  before I take this medication? They need to know if you have any of these conditions: Dehydration Dental disease Kidney disease Liver disease Low levels of calcium in the blood Lung or breathing disease, such as asthma Receiving steroids, such as dexamethasone or prednisone An unusual or allergic reaction to zoledronic acid, other medications, foods, dyes, or preservatives Pregnant or trying to get pregnant Breast-feeding How should I use this medication? This medication is injected into a vein. It is given by your care team in a hospital or clinic setting. Talk to your care team about the use of this medication in children. Special care may be needed. Overdosage: If you think you have taken too much of this medicine contact a poison control center or emergency room at once. NOTE: This medicine is only for you. Do not share this medicine with others. What if I miss a dose? Keep appointments for follow-up doses. It is important not to miss your dose. Call your care team if you are unable to keep an appointment. What may interact with this medication? Certain antibiotics given by injection Diuretics, such as bumetanide, furosemide NSAIDs, medications for pain and inflammation, such as ibuprofen or naproxen Teriparatide Thalidomide This list may not describe all possible interactions. Give your health care provider a list of all the medicines, herbs, non-prescription drugs, or dietary supplements you use. Also tell them if you smoke, drink alcohol, or use illegal drugs. Some items may interact with your medicine. What should I watch for while using this medication? Visit your care team for regular checks on your progress. It may be some time before you see the benefit from this medication. Some people who take this medication have severe bone, joint, or muscle pain. This medication may also increase your risk for jaw problems or a broken thigh bone. Tell your care team right away if you have  severe pain in your jaw, bones, joints, or muscles. Tell you care team if you have any pain that does not go away or that gets worse. Tell your dentist and dental surgeon that you are taking this medication. You should not have major dental surgery while on this medication. See your dentist to have a dental exam and fix any dental problems before starting this medication. Take good care of your teeth while on this medication. Make sure you see your dentist for regular follow-up appointments. You should make sure you get enough calcium and vitamin D while you are taking this medication. Discuss the foods you eat and the vitamins you take with your care team. Check with your care team if you have severe diarrhea, nausea, and vomiting, or if you sweat a lot. The loss of too much body fluid may make it dangerous for you to take this medication. You may need bloodwork while taking this medication. Talk to your care team if you wish to become pregnant or think you might be pregnant. This medication can cause serious birth defects. What side effects may I notice from receiving this medication? Side effects that you should report to your care team as soon as possible: Allergic reactions--skin rash, itching, hives, swelling of the face, lips, tongue,  or throat Kidney injury--decrease in the amount of urine, swelling of the ankles, hands, or feet Low calcium level--muscle pain or cramps, confusion, tingling, or numbness in the hands or feet Osteonecrosis of the jaw--pain, swelling, or redness in the mouth, numbness of the jaw, poor healing after dental work, unusual discharge from the mouth, visible bones in the mouth Severe bone, joint, or muscle pain Side effects that usually do not require medical attention (report to your care team if they continue or are bothersome): Constipation Fatigue Fever Loss of appetite Nausea Stomach pain This list may not describe all possible side effects. Call your doctor  for medical advice about side effects. You may report side effects to FDA at 1-800-FDA-1088. Where should I keep my medication? This medication is given in a hospital or clinic. It will not be stored at home. NOTE: This sheet is a summary. It may not cover all possible information. If you have questions about this medicine, talk to your doctor, pharmacist, or health care provider.  2023 Elsevier/Gold Standard (2021-03-31 00:00:00)

## 2021-10-25 NOTE — Progress Notes (Signed)
Pt in for follow up reports having increased burning in feet.  Reports started developing blisters on toes 2 weeks ago.

## 2021-10-26 LAB — KAPPA/LAMBDA LIGHT CHAINS
Kappa free light chain: 9.1 mg/L (ref 3.3–19.4)
Kappa, lambda light chain ratio: 2.39 — ABNORMAL HIGH (ref 0.26–1.65)
Lambda free light chains: 3.8 mg/L — ABNORMAL LOW (ref 5.7–26.3)

## 2021-10-27 ENCOUNTER — Encounter: Payer: Self-pay | Admitting: Oncology

## 2021-10-28 ENCOUNTER — Other Ambulatory Visit: Payer: Self-pay

## 2021-10-28 ENCOUNTER — Encounter: Payer: Self-pay | Admitting: Oncology

## 2021-11-01 ENCOUNTER — Encounter: Payer: Self-pay | Admitting: Oncology

## 2021-11-02 ENCOUNTER — Other Ambulatory Visit: Payer: Self-pay | Admitting: Oncology

## 2021-11-08 ENCOUNTER — Other Ambulatory Visit: Payer: Self-pay | Admitting: Oncology

## 2021-11-10 ENCOUNTER — Other Ambulatory Visit: Payer: Self-pay | Admitting: Oncology

## 2021-11-10 DIAGNOSIS — C9000 Multiple myeloma not having achieved remission: Secondary | ICD-10-CM

## 2021-11-11 ENCOUNTER — Encounter: Payer: Self-pay | Admitting: Oncology

## 2021-11-18 NOTE — Progress Notes (Unsigned)
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  INTERVAL HISTORY: Patient returns to clinic today for further ration and consideration of cycle 9 of maintenance daratumumab and Revlimid.  She continues to have peripheral neuropathy in her bilateral feet, but otherwise is tolerating her treatments well. She has no other neurologic complaints.  She denies any recent fevers or illnesses.  She has no chest pain, shortness of breath, cough, or hemoptysis.  She denies any nausea, vomiting, constipation, or diarrhea.  She has no urinary complaints.  Patient offers no further specific complaints today.  REVIEW OF SYSTEMS:   Review of Systems  Constitutional: Negative.  Negative for fever, malaise/fatigue and weight loss.  Respiratory: Negative.  Negative for cough, hemoptysis and shortness of breath.   Cardiovascular:  Negative for chest pain and leg swelling.  Gastrointestinal: Negative.  Negative for abdominal pain, diarrhea and nausea.  Genitourinary: Negative.  Negative for dysuria.  Musculoskeletal:  Positive for back pain and neck pain. Negative for joint pain.  Skin: Negative.  Negative for rash.  Neurological:  Positive for sensory change. Negative for dizziness, tremors, focal weakness, weakness and headaches.  Psychiatric/Behavioral: Negative.  The patient is not nervous/anxious.     As per HPI. Otherwise, a complete review of systems is negative.  PAST MEDICAL HISTORY: Past Medical History:  Diagnosis Date   Anxiety    Diabetes mellitus without complication ([place]) 1601   GERD (gastroesophageal reflux disease)    Hyperlipidemia    Myeloma ([place])    Myeloma ([place])    Personal  history of colonic polyps    Squamous cell carcinoma of skin 11/25/2013   Left chest. WD SCC with superficial infiltration.   Tachycardia     PAST SURGICAL HISTORY: Past Surgical History:  Procedure Laterality Date   AUGMENTATION MAMMAPLASTY Bilateral 0932   silicone and replacement in 2001   [place], [place], 1998,2003, 2008, 2013   COLONOSCOPY WITH PROPOFOL N/A 08/16/2016   Procedure: COLONOSCOPY WITH PROPOFOL;  Surgeon: Robert Bellow, MD;  Location: [facility] ENDOSCOPY;  Service: Endoscopy;  Laterality: N/A;   COLONOSCOPY WITH PROPOFOL N/A 10/05/2021   Procedure: COLONOSCOPY WITH PROPOFOL;  Surgeon: Robert Bellow, MD;  Location: [facility] ENDOSCOPY;  Service: Endoscopy;  Laterality: N/A;  HAS A PORT, NEEDS AMPICILLIN PER OFFICE   DILATION AND CURETTAGE OF UTERUS     ENDOMETRIAL ABLATION  12/1991   ganglion cyst removal      IR IMAGING GUIDED PORT INSERTION  10/27/2020   IR IMAGING GUIDED PORT INSERTION  04/04/2021   KYPHOPLASTY N/A 10/28/2020   Procedure: T12 and L3 KYPHOPLASTY;  Surgeon: Hessie Knows, MD;  Location: [facility] ORS;  Service: Orthopedics;  Laterality: N/A;   TONSILLECTOMY     WRIST SURGERY Right 05/1995    FAMILY HISTORY: Family History  Problem Relation Age of Onset   Colon polyps Sister    Colon cancer Father 1   Diabetes Mother    Breast cancer Neg Hx     ADVANCED DIRECTIVES (Y/N):  N  HEALTH MAINTENANCE: Social History   Tobacco Use   Smoking status: Former    Packs/day: 1.00    Years:  4.00    Total pack years: 4.00    Types: Cigarettes    Quit date: 10    Years since quitting: 41.7   Smokeless tobacco: Never  Vaping Use   Vaping Use: Never used  Substance Use Topics   Alcohol use: Yes    Alcohol/week: 1.0 - 2.0 standard drink of alcohol    Types: 1 - 2 Glasses of wine per week    Comment: occassionally   Drug use: No     Colonoscopy:  PAP:  Bone density:  Lipid  panel:  No Known Allergies  Current Outpatient Medications  Medication Sig Dispense Refill   acetaminophen (TYLENOL) 325 MG tablet Take 650 mg by mouth 2 (two) times a week. Premed for velcade. Taking Tuesdays and Friday     acyclovir (ZOVIRAX) 400 MG tablet TAKE 1 TABLET BY MOUTH TWICE DAILY 60 tablet 5   ALPRAZolam (XANAX) 0.25 MG tablet TAKE 1 TABLET BY MOUTH AT BEDTIME AS NEEDED FOR SLEEP 30 tablet 2   ASPIRIN 81 PO Take 81 mg by mouth daily.     atorvastatin (LIPITOR) 20 MG tablet Take 20 mg by mouth daily.     busPIRone (BUSPAR) 30 MG tablet Take 30 mg by mouth 2 (two) times daily.     CALCIUM-VITAMIN D PO Take by mouth 2 (two) times daily.     cyclobenzaprine (FLEXERIL) 10 MG tablet TAKE 1 TABLET BY MOUTH AT BEDTIME 30 tablet 2   dexamethasone (DECADRON) 4 MG tablet Take 5 tablets (20 mg total) by mouth once a week. 20 tablet 3   diphenhydrAMINE (BENADRYL) 25 mg capsule Take 25 mg by mouth 2 (two) times a week. Pre med for velcade. Only Tuesdays and Fridays     diphenoxylate-atropine (LOMOTIL) 2.5-0.025 MG tablet Take by mouth as needed. (Patient not taking: Reported on 08/02/2021)     gabapentin (NEURONTIN) 300 MG capsule Take 1 capsule (300 mg total) by mouth 2 (two) times daily. 60 capsule 2   lenalidomide (REVLIMID) 10 MG capsule TAKE 1 CAPSULE BY MOUTH 1 TIME A DAY FOR 21 DAYS ON THEN 7 DAYS OFF 21 capsule 0   lidocaine-prilocaine (EMLA) cream APPLY 1 APPLICATION TOPICALLY AS NEEDED 30 g 2   liraglutide (VICTOZA) 18 MG/3ML SOPN Inject 1.2 mg into the skin daily.     Magnesium 400 MG TABS Take 1 tablet by mouth daily.     meloxicam (MOBIC) 15 MG tablet Take 15 mg by mouth daily.     methocarbamol (ROBAXIN) 500 MG tablet Take 1 tablet (500 mg total) by mouth every 6 (six) hours as needed for muscle spasms. 40 tablet 1   metoprolol succinate (TOPROL-XL) 50 MG 24 hr tablet Take 50 mg by mouth. Take 1.5 tabs in am. Take 1 tablet at HS.     Multiple Vitamin (MULTI-VITAMIN) tablet Take 1  tablet by mouth daily.     omeprazole (PRILOSEC) 40 MG capsule Take 40 mg by mouth daily.     ondansetron (ZOFRAN) 8 MG tablet Take 1 tablet (8 mg total) by mouth 2 (two) times daily as needed (Nausea or vomiting). 60 tablet 2   oxyCODONE-acetaminophen (PERCOCET/ROXICET) 5-325 MG tablet TAKE 1-2 TABLETS BY MOUTH EVERY 4 HOURS AS NEEDED FOR SEVERE PAIN 90 tablet 0   prochlorperazine (COMPAZINE) 10 MG tablet TAKE 1 TABLET BY MOUTH EVERY 6 HOURS AS NEEDED FOR NAUSEA OR VOMITING 60 tablet 2   traMADol (ULTRAM) 50 MG tablet TAKE 1 TABLET BY MOUTH EVERY 6 HOURS AS  NEEDED FOR PAIN 60 tablet 2   No current facility-administered medications for this visit.   Facility-Administered Medications Ordered in Other Visits  Medication Dose Route Frequency Provider Last Rate Last Admin   0.9 %  sodium chloride infusion   Intravenous Continuous Lloyd Huger, MD   Stopped at 04/07/21 1142   sodium chloride flush (NS) 0.9 % injection 10 mL  10 mL Intravenous PRN Lloyd Huger, MD   10 mL at 11/23/20 0932    OBJECTIVE: There were no vitals filed for this visit.    There is no height or weight on file to calculate BMI.    ECOG FS:0 - Asymptomatic  General: Well-developed, well-nourished, no acute distress. Eyes: Pink conjunctiva, anicteric sclera. HEENT: Normocephalic, moist mucous membranes. Lungs: No audible wheezing or coughing. Heart: Regular rate and rhythm. Abdomen: Soft, nontender, no obvious distention. Musculoskeletal: No edema, cyanosis, or clubbing. Neuro: Alert, answering all questions appropriately. Cranial nerves grossly intact. Skin: No rashes or petechiae noted. Psych: Normal affect.   LAB RESULTS:  Lab Results  Component Value Date   NA 135 10/25/2021   K 3.9 10/25/2021   CL 105 10/25/2021   CO2 26 10/25/2021   GLUCOSE 146 (H) 10/25/2021   BUN 26 (H) 10/25/2021   CREATININE 1.24 (H) 10/25/2021   CALCIUM 8.5 (L) 10/25/2021   PROT 6.3 (L) 10/25/2021   ALBUMIN 3.8  10/25/2021   AST 33 10/25/2021   ALT 39 10/25/2021   ALKPHOS 103 10/25/2021   BILITOT 0.5 10/25/2021   GFRNONAA 48 (L) 10/25/2021    Lab Results  Component Value Date   WBC 3.6 (L) 10/25/2021   NEUTROABS 2.0 10/25/2021   HGB 11.4 (L) 10/25/2021   HCT 35.0 (L) 10/25/2021   MCV 99.2 10/25/2021   PLT 137 (L) 10/25/2021     STUDIES: No results found.  ONCOLOGY HISTORY:  Diagnosis confirmed from bone biopsy on August 17, 2020.  Initially her lambda free light chains were elevated at 432.1, but now are within normal limits at 12.1.  Immunoglobulins and SPEP are normal.  Bone marrow biopsy completed on August 26, 2020 revealed 25% plasma cells along with the deletion of 1p which is considered poor prognosis.  PET scan results from September 06, 2020 reviewed independently with 3 hypermetabolic lesions in right C5-6 facets, right iliac crest lesion, and L3 vertebral lesion.  After lengthy discussion with the patient, she agreed to pursue chemotherapy with daratumumab, Velcade, Revlimid, and dexamethasone followed by autologous bone marrow transplant.  Patient received weekly daratumumab for 4 cycles along with Velcade on days 1, 4, 8, and 11.  She also received Revlimid on days 1 through 14 of a 21-day cycle.  She completed cycle 4 of treatment on January 14, 2021.  Subsequent bone marrow biopsy on January 24, 2021 revealed complete remission with no evidence of myeloma.  Patient underwent autologous [facility] cell [facility] on February 24, 2021.  She was readmitted to the hospital on March 04, 2021 with neutropenic fever, diarrhea, and sepsis-like symptoms.  Infectious disease work-up remains negative.   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  PLAN:      1.  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  She received consolidation with cycle 5 and 6 using daratumumab on day 1, Velcade on  day 1, 4, 8, and 11, Revlimid 10 mg on days 1 through 14 with  7 days off, and weekly dexamethasone on a 21-day cycle.  She is now on maintenance daratumumab on day 1 and Revlimid 10 mg on days 1 through 21 on a 28-day cycle for cycles 7 through 32.  Repeat bone marrow biopsy on October 03, 2021 revealed no evidence of a myeloid neoplasm.  Proceed with cycle 9 of treatment today.  Patient takes all of her premedications at home prior to clinic, therefore these have been removed from the treatment plan.  Patient will also receive Zometa today.  Return to clinic in 4 weeks for further evaluation and consideration of cycle 10.   2.  Pain: She has completed XRT.  She had kyphoplasty x2.  Continue Percocet and tramadol as needed.  Repeat MRI results from October 06, 2021 reviewed independently and reported as above.  Patient was given a referral back to orthopedics for further evaluation and treatment.   3.  Hypercalcemia: Resolved, patient now has a mild hypocalcemia.  Zometa as above.   4.  Renal insufficiency: Mild.  Patient's most recent creatinine is 1.24. 5.  Anemia: Chronic and unchanged.  Patient's hemoglobin is 11.4 today. 6.  Thrombocytopenia: Mild, chronic and unchanged.  Patient's platelet count is 137 today. 7.  Vaccinations: Continue revaccination schedule as per [facility]. 8.  Transaminitis: Resolved.  Patient expressed understanding and was in agreement with this plan. She also understands that She can call clinic at any time with any questions, concerns, or complaints.      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

## 2021-11-21 ENCOUNTER — Other Ambulatory Visit: Payer: Self-pay | Admitting: Oncology

## 2021-11-21 DIAGNOSIS — C9000 Multiple myeloma not having achieved remission: Secondary | ICD-10-CM

## 2021-11-22 ENCOUNTER — Inpatient Hospital Stay: Payer: Medicare PPO | Attending: Oncology | Admitting: Oncology

## 2021-11-22 ENCOUNTER — Inpatient Hospital Stay: Payer: Medicare PPO

## 2021-11-22 ENCOUNTER — Encounter: Payer: Self-pay | Admitting: Oncology

## 2021-11-22 VITALS — BP 111/63 | HR 79 | Temp 96.9°F | Ht 61.0 in | Wt 129.0 lb

## 2021-11-22 DIAGNOSIS — Z5112 Encounter for antineoplastic immunotherapy: Secondary | ICD-10-CM | POA: Insufficient documentation

## 2021-11-22 DIAGNOSIS — N289 Disorder of kidney and ureter, unspecified: Secondary | ICD-10-CM | POA: Insufficient documentation

## 2021-11-22 DIAGNOSIS — C9000 Multiple myeloma not having achieved remission: Secondary | ICD-10-CM | POA: Diagnosis not present

## 2021-11-22 DIAGNOSIS — Z79899 Other long term (current) drug therapy: Secondary | ICD-10-CM | POA: Insufficient documentation

## 2021-11-22 DIAGNOSIS — C9001 Multiple myeloma in remission: Secondary | ICD-10-CM | POA: Diagnosis present

## 2021-11-22 DIAGNOSIS — Z923 Personal history of irradiation: Secondary | ICD-10-CM | POA: Diagnosis not present

## 2021-11-22 DIAGNOSIS — D696 Thrombocytopenia, unspecified: Secondary | ICD-10-CM | POA: Diagnosis not present

## 2021-11-22 DIAGNOSIS — M549 Dorsalgia, unspecified: Secondary | ICD-10-CM

## 2021-11-22 LAB — COMPREHENSIVE METABOLIC PANEL
ALT: 56 U/L — ABNORMAL HIGH (ref 0–44)
AST: 56 U/L — ABNORMAL HIGH (ref 15–41)
Albumin: 3.7 g/dL (ref 3.5–5.0)
Alkaline Phosphatase: 100 U/L (ref 38–126)
Anion gap: 4 — ABNORMAL LOW (ref 5–15)
BUN: 16 mg/dL (ref 8–23)
CO2: 24 mmol/L (ref 22–32)
Calcium: 8.6 mg/dL — ABNORMAL LOW (ref 8.9–10.3)
Chloride: 113 mmol/L — ABNORMAL HIGH (ref 98–111)
Creatinine, Ser: 1.26 mg/dL — ABNORMAL HIGH (ref 0.44–1.00)
GFR, Estimated: 47 mL/min — ABNORMAL LOW (ref >60)
Glucose, Bld: 137 mg/dL — ABNORMAL HIGH (ref 70–99)
Potassium: 4.1 mmol/L (ref 3.5–5.1)
Sodium: 141 mmol/L (ref 135–145)
Total Bilirubin: 0.5 mg/dL (ref 0.3–1.2)
Total Protein: 6.3 g/dL — ABNORMAL LOW (ref 6.5–8.1)

## 2021-11-22 LAB — CBC WITH DIFFERENTIAL/PLATELET
Abs Immature Granulocytes: 0.01 10*3/uL (ref 0.00–0.07)
Basophils Absolute: 0.1 10*3/uL (ref 0.0–0.1)
Basophils Relative: 2 %
Eosinophils Absolute: 0.2 10*3/uL (ref 0.0–0.5)
Eosinophils Relative: 5 %
HCT: 35.9 % — ABNORMAL LOW (ref 36.0–46.0)
Hemoglobin: 12 g/dL (ref 12.0–15.0)
Immature Granulocytes: 0 %
Lymphocytes Relative: 18 %
Lymphs Abs: 0.6 10*3/uL — ABNORMAL LOW (ref 0.7–4.0)
MCH: 32.3 pg (ref 26.0–34.0)
MCHC: 33.4 g/dL (ref 30.0–36.0)
MCV: 96.8 fL (ref 80.0–100.0)
Monocytes Absolute: 0.4 10*3/uL (ref 0.1–1.0)
Monocytes Relative: 14 %
Neutro Abs: 2 10*3/uL (ref 1.7–7.7)
Neutrophils Relative %: 61 %
Platelets: 111 10*3/uL — ABNORMAL LOW (ref 150–400)
RBC: 3.71 MIL/uL — ABNORMAL LOW (ref 3.87–5.11)
RDW: 13.5 % (ref 11.5–15.5)
WBC: 3.2 10*3/uL — ABNORMAL LOW (ref 4.0–10.5)
nRBC: 0 % (ref 0.0–0.2)

## 2021-11-22 MED ORDER — ZOLEDRONIC ACID 4 MG/5ML IV CONC
3.3000 mg | Freq: Once | INTRAVENOUS | Status: AC
  Administered 2021-11-22: 3.3 mg via INTRAVENOUS
  Filled 2021-11-22: qty 4.13

## 2021-11-22 MED ORDER — TRAMADOL HCL 50 MG PO TABS: 50.0000 mg | ORAL_TABLET | Freq: Four times a day (QID) | ORAL | 2 refills | Status: DC | PRN

## 2021-11-22 MED ORDER — DIPHENHYDRAMINE HCL 25 MG PO CAPS: 25.0000 mg | ORAL_CAPSULE | Freq: Once | ORAL | Status: DC

## 2021-11-22 MED ORDER — ACETAMINOPHEN 325 MG PO TABS: 650.0000 mg | ORAL_TABLET | Freq: Once | ORAL | Status: DC

## 2021-11-22 MED ORDER — DARATUMUMAB-HYALURONIDASE-FIHJ 1800-30000 MG-UT/15ML ~~LOC~~ SOLN
1800.0000 mg | Freq: Once | SUBCUTANEOUS | Status: AC
  Administered 2021-11-22: 1800 mg via SUBCUTANEOUS
  Filled 2021-11-22: qty 15

## 2021-11-22 MED ORDER — HEPARIN SOD (PORK) LOCK FLUSH 100 UNIT/ML IV SOLN
INTRAVENOUS | Status: AC
  Administered 2021-11-22: 500 [IU]
  Filled 2021-11-22: qty 5

## 2021-11-22 MED ORDER — HEPARIN SOD (PORK) LOCK FLUSH 100 UNIT/ML IV SOLN
500.0000 [IU] | Freq: Once | INTRAVENOUS | Status: AC | PRN
  Filled 2021-11-22: qty 5

## 2021-11-22 MED ORDER — SODIUM CHLORIDE 0.9 % IV SOLN
Freq: Once | INTRAVENOUS | Status: AC
  Filled 2021-11-22: qty 250

## 2021-11-22 MED ORDER — DEXAMETHASONE 4 MG PO TABS: 20.0000 mg | ORAL_TABLET | Freq: Once | ORAL | Status: DC

## 2021-11-22 NOTE — Progress Notes (Signed)
Pt states she took Tylenol '650mg'$ , Benadryl '25mg'$  and Decadron '20mg'$  around 730 this morning. Per Lovena Le CMA per Dr Woodfin Ganja. Ok to proceed with Darzalex and no post monitoring necessary. Ok to proceed with Zometa with Calcium 8.6 and Creat 1.26

## 2021-11-22 NOTE — Patient Instructions (Signed)
[facility] CANCER CTR AT [place]  Discharge Instructions: Thank you for choosing [facility] to provide your oncology and hematology care.  If you have a lab appointment with the [facility], please go directly to the [facility] and check in at the registration area.  Wear comfortable clothing and clothing appropriate for easy access to any Portacath or PICC line.   We strive to give you quality time with your provider. You may need to reschedule your appointment if you arrive late (15 or more minutes).  Arriving late affects you and other patients whose appointments are after yours.  Also, if you miss three or more appointments without notifying the office, you may be dismissed from the clinic at the provider's discretion.      For prescription refill requests, have your pharmacy contact our office and allow 72 hours for refills to be completed.    Today you received the following chemotherapy and/or immunotherapy agents Darzalex & Zometa      To help prevent nausea and vomiting after your treatment, we encourage you to take your nausea medication as directed.  BELOW ARE SYMPTOMS THAT SHOULD BE REPORTED IMMEDIATELY: *FEVER GREATER THAN 100.4 F (38 C) OR HIGHER *CHILLS OR SWEATING *NAUSEA AND VOMITING THAT IS NOT CONTROLLED WITH YOUR NAUSEA MEDICATION *UNUSUAL SHORTNESS OF BREATH *UNUSUAL BRUISING OR BLEEDING *URINARY PROBLEMS (pain or burning when urinating, or frequent urination) *BOWEL PROBLEMS (unusual diarrhea, constipation, pain near the anus) TENDERNESS IN MOUTH AND THROAT WITH OR WITHOUT PRESENCE OF ULCERS (sore throat, sores in mouth, or a toothache) UNUSUAL RASH, SWELLING OR PAIN  UNUSUAL VAGINAL DISCHARGE OR ITCHING   Items with * indicate a potential emergency and should be followed up as soon as possible or go to the Emergency Department if any problems should occur.  Please show the CHEMOTHERAPY ALERT CARD or IMMUNOTHERAPY ALERT CARD at  check-in to the Emergency Department and triage nurse.  Should you have questions after your visit or need to cancel or reschedule your appointment, please contact [facility] CANCER [place] AT [place]  [phone] and follow the prompts.  Office hours are 8:00 a.m. to 4:30 p.m. Monday - Friday. Please note that voicemails left after 4:00 p.m. may not be returned until the following business day.  We are closed weekends and major holidays. You have access to a nurse at all times for urgent questions. Please call the main number to the clinic [phone] and follow the prompts.  For any non-urgent questions, you may also contact your provider using MyChart. We now offer e-Visits for anyone 23 and older to request care online for non-urgent symptoms. For details visit [email or web address].   Also download the MyChart app! Go to the app store, search "MyChart", open the app, select [facility], and log in with your MyChart username and password.  Masks are optional in the cancer centers. If you would like for your care team to wear a mask while they are taking care of you, please let them know. For doctor visits, patients may have with them one support person who is at least 65 years old. At this time, visitors are not allowed in the infusion area.

## 2021-11-23 ENCOUNTER — Other Ambulatory Visit: Payer: Self-pay

## 2021-11-28 ENCOUNTER — Other Ambulatory Visit: Payer: Self-pay

## 2021-11-29 ENCOUNTER — Inpatient Hospital Stay: Payer: Medicare PPO | Attending: Oncology

## 2021-11-29 ENCOUNTER — Ambulatory Visit
Admission: RE | Admit: 2021-11-29 | Discharge: 2021-11-29 | Disposition: A | Discharge status: Home / Self Care | Payer: Medicare PPO | Source: Ambulatory Visit | Attending: Oncology | Admitting: Oncology

## 2021-11-29 DIAGNOSIS — D72819 Decreased white blood cell count, unspecified: Secondary | ICD-10-CM | POA: Diagnosis not present

## 2021-11-29 DIAGNOSIS — N289 Disorder of kidney and ureter, unspecified: Secondary | ICD-10-CM | POA: Insufficient documentation

## 2021-11-29 DIAGNOSIS — Z79899 Other long term (current) drug therapy: Secondary | ICD-10-CM | POA: Diagnosis not present

## 2021-11-29 DIAGNOSIS — G629 Polyneuropathy, unspecified: Secondary | ICD-10-CM | POA: Insufficient documentation

## 2021-11-29 DIAGNOSIS — D649 Anemia, unspecified: Secondary | ICD-10-CM | POA: Insufficient documentation

## 2021-11-29 DIAGNOSIS — C9001 Multiple myeloma in remission: Secondary | ICD-10-CM | POA: Diagnosis present

## 2021-11-29 DIAGNOSIS — M549 Dorsalgia, unspecified: Secondary | ICD-10-CM | POA: Diagnosis not present

## 2021-11-29 DIAGNOSIS — Z923 Personal history of irradiation: Secondary | ICD-10-CM | POA: Diagnosis not present

## 2021-11-29 DIAGNOSIS — Z95828 Presence of other vascular implants and grafts: Secondary | ICD-10-CM

## 2021-11-29 DIAGNOSIS — Z5112 Encounter for antineoplastic immunotherapy: Secondary | ICD-10-CM | POA: Insufficient documentation

## 2021-11-29 MED ORDER — SODIUM CHLORIDE 0.9% FLUSH
10.0000 mL | Freq: Once | INTRAVENOUS | Status: AC
  Administered 2021-11-29: 10 mL via INTRAVENOUS
  Filled 2021-11-29: qty 10

## 2021-11-29 MED ORDER — GADOBUTROL 1 MMOL/ML IV SOLN
6.0000 mL | Freq: Once | INTRAVENOUS | Status: AC | PRN
  Administered 2021-11-29: 6 mL via INTRAVENOUS

## 2021-11-29 MED ORDER — HEPARIN SOD (PORK) LOCK FLUSH 100 UNIT/ML IV SOLN
500.0000 [IU] | Freq: Once | INTRAVENOUS | Status: DC
  Administered 2021-11-29: 500 [IU] via INTRAVENOUS
  Filled 2021-11-29: qty 5

## 2021-12-06 ENCOUNTER — Other Ambulatory Visit: Payer: Self-pay

## 2021-12-07 ENCOUNTER — Other Ambulatory Visit: Payer: Self-pay | Admitting: Oncology

## 2021-12-07 ENCOUNTER — Other Ambulatory Visit: Payer: Self-pay

## 2021-12-07 DIAGNOSIS — C9000 Multiple myeloma not having achieved remission: Secondary | ICD-10-CM

## 2021-12-17 ENCOUNTER — Other Ambulatory Visit: Payer: Self-pay

## 2021-12-19 ENCOUNTER — Other Ambulatory Visit: Payer: Self-pay

## 2021-12-20 ENCOUNTER — Inpatient Hospital Stay: Payer: Medicare PPO | Admitting: Oncology

## 2021-12-20 ENCOUNTER — Ambulatory Visit: Payer: Medicare PPO

## 2021-12-20 ENCOUNTER — Encounter: Payer: Self-pay | Admitting: Oncology

## 2021-12-20 ENCOUNTER — Inpatient Hospital Stay: Payer: Medicare PPO

## 2021-12-20 DIAGNOSIS — C9000 Multiple myeloma not having achieved remission: Secondary | ICD-10-CM

## 2021-12-20 DIAGNOSIS — Z5112 Encounter for antineoplastic immunotherapy: Secondary | ICD-10-CM | POA: Diagnosis not present

## 2021-12-20 LAB — COMPREHENSIVE METABOLIC PANEL
ALT: 55 U/L — ABNORMAL HIGH (ref 0–44)
AST: 43 U/L — ABNORMAL HIGH (ref 15–41)
Albumin: 3.7 g/dL (ref 3.5–5.0)
Alkaline Phosphatase: 111 U/L (ref 38–126)
Anion gap: 7 (ref 5–15)
BUN: 21 mg/dL (ref 8–23)
CO2: 21 mmol/L — ABNORMAL LOW (ref 22–32)
Calcium: 8.4 mg/dL — ABNORMAL LOW (ref 8.9–10.3)
Chloride: 110 mmol/L (ref 98–111)
Creatinine, Ser: 1.37 mg/dL — ABNORMAL HIGH (ref 0.44–1.00)
GFR, Estimated: 43 mL/min — ABNORMAL LOW (ref >60)
Glucose, Bld: 222 mg/dL — ABNORMAL HIGH (ref 70–99)
Potassium: 4 mmol/L (ref 3.5–5.1)
Sodium: 138 mmol/L (ref 135–145)
Total Bilirubin: 0.5 mg/dL (ref 0.3–1.2)
Total Protein: 6.2 g/dL — ABNORMAL LOW (ref 6.5–8.1)

## 2021-12-20 LAB — CBC WITH DIFFERENTIAL/PLATELET
Abs Immature Granulocytes: 0.01 10*3/uL (ref 0.00–0.07)
Basophils Absolute: 0 10*3/uL (ref 0.0–0.1)
Basophils Relative: 1 %
Eosinophils Absolute: 0.1 10*3/uL (ref 0.0–0.5)
Eosinophils Relative: 4 %
HCT: 33.7 % — ABNORMAL LOW (ref 36.0–46.0)
Hemoglobin: 10.7 g/dL — ABNORMAL LOW (ref 12.0–15.0)
Immature Granulocytes: 0 %
Lymphocytes Relative: 18 %
Lymphs Abs: 0.5 10*3/uL — ABNORMAL LOW (ref 0.7–4.0)
MCH: 31.6 pg (ref 26.0–34.0)
MCHC: 31.8 g/dL (ref 30.0–36.0)
MCV: 99.4 fL (ref 80.0–100.0)
Monocytes Absolute: 0.4 10*3/uL (ref 0.1–1.0)
Monocytes Relative: 14 %
Neutro Abs: 1.7 10*3/uL (ref 1.7–7.7)
Neutrophils Relative %: 63 %
Platelets: 103 10*3/uL — ABNORMAL LOW (ref 150–400)
RBC: 3.39 MIL/uL — ABNORMAL LOW (ref 3.87–5.11)
RDW: 15.1 % (ref 11.5–15.5)
WBC: 2.8 10*3/uL — ABNORMAL LOW (ref 4.0–10.5)
nRBC: 0 % (ref 0.0–0.2)

## 2021-12-20 MED ORDER — ACETAMINOPHEN 325 MG PO TABS: 650.0000 mg | ORAL_TABLET | Freq: Once | ORAL | Status: DC

## 2021-12-20 MED ORDER — DARATUMUMAB-HYALURONIDASE-FIHJ 1800-30000 MG-UT/15ML ~~LOC~~ SOLN
1800.0000 mg | Freq: Once | SUBCUTANEOUS | Status: AC
  Administered 2021-12-20: 1800 mg via SUBCUTANEOUS
  Filled 2021-12-20: qty 15

## 2021-12-20 MED ORDER — HEPARIN SOD (PORK) LOCK FLUSH 100 UNIT/ML IV SOLN
500.0000 [IU] | Freq: Once | INTRAVENOUS | Status: AC | PRN
  Administered 2021-12-20: 500 [IU]
  Filled 2021-12-20: qty 5

## 2021-12-20 MED ORDER — SODIUM CHLORIDE 0.9 % IV SOLN
Freq: Once | INTRAVENOUS | Status: AC
  Filled 2021-12-20: qty 250

## 2021-12-20 MED ORDER — DIPHENHYDRAMINE HCL 25 MG PO CAPS: 25.0000 mg | ORAL_CAPSULE | Freq: Once | ORAL | Status: DC

## 2021-12-20 MED ORDER — DEXAMETHASONE 4 MG PO TABS: 20.0000 mg | ORAL_TABLET | Freq: Once | ORAL | Status: DC

## 2021-12-20 MED ORDER — ZOLEDRONIC ACID 4 MG/5ML IV CONC
3.3000 mg | Freq: Once | INTRAVENOUS | Status: AC
  Administered 2021-12-20: 3.3 mg via INTRAVENOUS
  Filled 2021-12-20: qty 4.13

## 2021-12-20 NOTE — Progress Notes (Signed)
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  INTERVAL HISTORY: Patient returns to clinic today for further evaluation and consideration of cycle 11 of maintenance daratumumab and Revlimid.  Since increasing her dose of tramadol, her back pain has improved.  She otherwise feels well.  Her peripheral neuropathy is essentially unchanged.  She has no other neurologic complaints.  She denies any recent fevers or illnesses.  She has no chest pain, shortness of breath, cough, or hemoptysis.  She denies any nausea, vomiting, constipation, or diarrhea.  She has no urinary complaints.  Patient offers no further specific complaints today.  REVIEW OF SYSTEMS:   Review of Systems  Constitutional: Negative.  Negative for fever, malaise/fatigue and weight loss.  Respiratory: Negative.  Negative for cough, hemoptysis and shortness of breath.   Cardiovascular:  Negative for chest pain and leg swelling.  Gastrointestinal: Negative.  Negative for abdominal pain, diarrhea and nausea.  Genitourinary: Negative.  Negative for dysuria.  Musculoskeletal:  Positive for back pain and neck pain. Negative for joint pain.  Skin: Negative.  Negative for rash.  Neurological:  Positive for sensory change. Negative for dizziness, tremors, focal weakness, weakness and headaches.  Psychiatric/Behavioral: Negative.  The patient is not nervous/anxious.     As per HPI. Otherwise, a complete review of systems is negative.  PAST MEDICAL HISTORY: Past Medical History:  Diagnosis Date   Anxiety    Diabetes mellitus without complication ([place]) 1749   GERD (gastroesophageal reflux disease)    Hyperlipidemia    Myeloma  ([place])    Myeloma ([place])    Personal history of colonic polyps    Squamous cell carcinoma of skin 11/25/2013   Left chest. WD SCC with superficial infiltration.   Tachycardia     PAST SURGICAL HISTORY: Past Surgical History:  Procedure Laterality Date   AUGMENTATION MAMMAPLASTY Bilateral 4496   silicone and replacement in 2001   [place], [place], 1998,2003, 2008, 2013   COLONOSCOPY WITH PROPOFOL N/A 08/16/2016   Procedure: COLONOSCOPY WITH PROPOFOL;  Surgeon: Robert Bellow, MD;  Location: [facility] ENDOSCOPY;  Service: Endoscopy;  Laterality: N/A;   COLONOSCOPY WITH PROPOFOL N/A 10/05/2021   Procedure: COLONOSCOPY WITH PROPOFOL;  Surgeon: Robert Bellow, MD;  Location: [facility] ENDOSCOPY;  Service: Endoscopy;  Laterality: N/A;  HAS A PORT, NEEDS AMPICILLIN PER OFFICE   DILATION AND CURETTAGE OF UTERUS     ENDOMETRIAL ABLATION  12/1991   ganglion cyst removal      IR IMAGING GUIDED PORT INSERTION  10/27/2020   IR IMAGING GUIDED PORT INSERTION  04/04/2021   KYPHOPLASTY N/A 10/28/2020   Procedure: T12 and L3 KYPHOPLASTY;  Surgeon: Hessie Knows, MD;  Location: [facility] ORS;  Service: Orthopedics;  Laterality: N/A;   TONSILLECTOMY     WRIST SURGERY Right 05/1995    FAMILY HISTORY: Family History  Problem Relation Age of Onset   Colon polyps Sister    Colon cancer Father 74   Diabetes Mother    Breast cancer Neg Hx     ADVANCED DIRECTIVES (Y/N):  N  HEALTH MAINTENANCE: Social History   Tobacco Use   Smoking status: Former  Packs/day: 1.00    Years: 4.00    Total pack years: 4.00    Types: Cigarettes    Quit date: 25    Years since quitting: 41.8   Smokeless tobacco: Never  Vaping Use   Vaping Use: Never used  Substance Use Topics   Alcohol use: Yes    Alcohol/week: 1.0 - 2.0 standard drink of alcohol    Types: 1 - 2 Glasses of wine per week    Comment: occassionally   Drug use: No      Colonoscopy:  PAP:  Bone density:  Lipid panel:  No Known Allergies  Current Outpatient Medications  Medication Sig Dispense Refill   acetaminophen (TYLENOL) 325 MG tablet Take 650 mg by mouth 2 (two) times a week. Premed for velcade. Taking Tuesdays and Friday     acyclovir (ZOVIRAX) 400 MG tablet TAKE 1 TABLET BY MOUTH TWICE DAILY 60 tablet 5   ALPRAZolam (XANAX) 0.25 MG tablet TAKE 1 TABLET BY MOUTH AT BEDTIME AS NEEDED FOR SLEEP 30 tablet 2   ASPIRIN 81 PO Take 81 mg by mouth daily.     atorvastatin (LIPITOR) 20 MG tablet Take 20 mg by mouth daily.     busPIRone (BUSPAR) 30 MG tablet Take 30 mg by mouth 2 (two) times daily.     CALCIUM-VITAMIN D PO Take by mouth 2 (two) times daily.     cyclobenzaprine (FLEXERIL) 10 MG tablet TAKE 1 TABLET BY MOUTH AT BEDTIME 30 tablet 2   dexamethasone (DECADRON) 4 MG tablet Take 5 tablets (20 mg total) by mouth once a week. 20 tablet 3   diphenhydrAMINE (BENADRYL) 25 mg capsule Take 25 mg by mouth 2 (two) times a week. Pre med for velcade. Only Tuesdays and Fridays     gabapentin (NEURONTIN) 300 MG capsule Take 1 capsule (300 mg total) by mouth 2 (two) times daily. 60 capsule 2   ivabradine (CORLANOR) 5 MG TABS tablet Take 5 mg by mouth 2 (two) times daily with a meal.     lenalidomide (REVLIMID) 10 MG capsule TAKE 1 CAPSULE BY MOUTH 1 TIME A DAY FOR 21 DAYS ON THEN 7 DAYS OFF 21 capsule 0   lidocaine-prilocaine (EMLA) cream APPLY 1 APPLICATION TOPICALLY AS NEEDED 30 g 2   liraglutide (VICTOZA) 18 MG/3ML SOPN Inject 1.2 mg into the skin daily.     Magnesium 400 MG TABS Take 1 tablet by mouth daily.     meloxicam (MOBIC) 15 MG tablet Take 15 mg by mouth daily.     methocarbamol (ROBAXIN) 500 MG tablet Take 1 tablet (500 mg total) by mouth every 6 (six) hours as needed for muscle spasms. 40 tablet 1   Multiple Vitamin (MULTI-VITAMIN) tablet Take 1 tablet by mouth daily.     omeprazole (PRILOSEC) 40 MG capsule Take 40 mg by mouth daily.      ondansetron (ZOFRAN) 8 MG tablet Take 1 tablet (8 mg total) by mouth 2 (two) times daily as needed (Nausea or vomiting). 60 tablet 2   oxyCODONE-acetaminophen (PERCOCET/ROXICET) 5-325 MG tablet TAKE 1-2 TABLETS BY MOUTH EVERY 4 HOURS AS NEEDED FOR SEVERE PAIN 90 tablet 0   prochlorperazine (COMPAZINE) 10 MG tablet TAKE 1 TABLET BY MOUTH EVERY 6 HOURS AS NEEDED FOR NAUSEA OR VOMITING 60 tablet 2   traMADol (ULTRAM) 50 MG tablet Take 1 tablet (50 mg total) by mouth every 6 (six) hours as needed. Take 1 to 2 tablets (100 mg total) as needed for pain. 120 tablet  2   diphenoxylate-atropine (LOMOTIL) 2.5-0.025 MG tablet Take by mouth as needed. (Patient not taking: Reported on 08/02/2021)     No current facility-administered medications for this visit.   Facility-Administered Medications Ordered in Other Visits  Medication Dose Route Frequency Provider Last Rate Last Admin   0.9 %  sodium chloride infusion   Intravenous Continuous Lloyd Huger, MD   Stopped at 04/07/21 1142   0.9 %  sodium chloride infusion   Intravenous Once Lloyd Huger, MD       acetaminophen (TYLENOL) tablet 650 mg  650 mg Oral Once Lloyd Huger, MD       daratumumab-hyaluronidase-fihj [facility] FASPRO) 1800-30000 MG-UT/15ML chemo SQ injection 1,800 mg  1,800 mg Subcutaneous Once Lloyd Huger, MD       dexamethasone (DECADRON) tablet 20 mg  20 mg Oral Once Lloyd Huger, MD       diphenhydrAMINE (BENADRYL) capsule 25 mg  25 mg Oral Once Lloyd Huger, MD       sodium chloride flush (NS) 0.9 % injection 10 mL  10 mL Intravenous PRN Lloyd Huger, MD   10 mL at 11/23/20 0932   zoledronic acid (ZOMETA) 3.3 mg in sodium chloride 0.9 % 100 mL IVPB  3.3 mg Intravenous Once Lloyd Huger, MD        OBJECTIVE: Vitals:   12/20/21 0837  BP: (!) 109/55  Pulse: 80  Resp: 16  Temp: (!) 97.2 F (36.2 C)  SpO2: 100%     Body mass index is 25.07 kg/m.    ECOG FS:0 -  Asymptomatic  General: Well-developed, well-nourished, no acute distress. Eyes: Pink conjunctiva, anicteric sclera. HEENT: Normocephalic, moist mucous membranes. Lungs: No audible wheezing or coughing. Heart: Regular rate and rhythm. Abdomen: Soft, nontender, no obvious distention. Musculoskeletal: No edema, cyanosis, or clubbing. Neuro: Alert, answering all questions appropriately. Cranial nerves grossly intact. Skin: No rashes or petechiae noted. Psych: Normal affect.  LAB RESULTS:  Lab Results  Component Value Date   NA 138 12/20/2021   K 4.0 12/20/2021   CL 110 12/20/2021   CO2 21 (L) 12/20/2021   GLUCOSE 222 (H) 12/20/2021   BUN 21 12/20/2021   CREATININE 1.37 (H) 12/20/2021   CALCIUM 8.4 (L) 12/20/2021   PROT 6.2 (L) 12/20/2021   ALBUMIN 3.7 12/20/2021   AST 43 (H) 12/20/2021   ALT 55 (H) 12/20/2021   ALKPHOS 111 12/20/2021   BILITOT 0.5 12/20/2021   GFRNONAA 43 (L) 12/20/2021    Lab Results  Component Value Date   WBC 2.8 (L) 12/20/2021   NEUTROABS 1.7 12/20/2021   HGB 10.7 (L) 12/20/2021   HCT 33.7 (L) 12/20/2021   MCV 99.4 12/20/2021   PLT 103 (L) 12/20/2021     STUDIES: MR SACRUM SI JOINTS W WO CONTRAST  Result Date: 12/01/2021 CLINICAL DATA:  Sacral pain since falling 5 months ago. History of lambda light chain myeloma in remission. EXAM: MRI SACRUM WITHOUT AND WITH CONTRAST TECHNIQUE: Multiplanar multi-sequence MR imaging of the sacrum was performed before and after the administration of intravenous contrast. COMPARISON:  Lumbar MRI 10/06/2021.  PET-CT 09/06/2020. FINDINGS: Bones/Joint/Cartilage The known dominant lytic lesion within the right iliac bone measures up to 4.0 x 1.5 cm on the axial images (image 7/13). Although there is no recurrent soft tissue mass associated with this chronic lytic lesion, there is thick peripheral enhancement of this lesion. There is heterogeneous marrow signal and enhancement inferiorly in the sacrum and iliac bones  bilaterally, not imaged on recent lumbar MRI. Some of the findings in the iliac bones are attributed to previous bone marrow biopsies. Limited comparison makes exclusion of recurrent multiple myeloma difficult. No typical stress fracture or pathologic fracture. Mild sacroiliac degenerative changes bilaterally. Lower lumbar spondylosis appears similar to recent MRI with biforaminal narrowing at L4-5. Ligaments No significant ligamentous abnormalities are identified. Muscles and Tendons The visualized posterior pelvic musculature appears unremarkable. Soft tissue No soft tissue masses or fluid collections are identified. IMPRESSION: 1. The known dominant lytic lesion in the right iliac bone demonstrates thick peripheral enhancement, which may relate to a treated myelomatous lesion. Heterogeneous marrow signal and enhancement inferiorly in the sacrum and iliac bones bilaterally, not imaged on recent lumbar MRI. Some of the findings in the iliac bones are attributed to previous bone marrow biopsies. Limited comparison makes exclusion of recurrent multiple myeloma difficult. Consider follow-up PET-CT. 2. No typical stress fracture or pathologic fracture identified. 3. Lower lumbar spondylosis with biforaminal narrowing at L4-5. Electronically Signed   By: Richardean Sale M.D.   On: 12/01/2021 11:18    ONCOLOGY HISTORY:  Diagnosis confirmed from bone biopsy on August 17, 2020.  Initially her lambda free light chains were elevated at 432.1, but now are within normal limits at 12.1.  Immunoglobulins and SPEP are normal.  Bone marrow biopsy completed on August 26, 2020 revealed 25% plasma cells along with the deletion of 1p which is considered poor prognosis.  PET scan results from September 06, 2020 reviewed independently with 3 hypermetabolic lesions in right C5-6 facets, right iliac crest lesion, and L3 vertebral lesion.  After lengthy discussion with the patient, she agreed to pursue chemotherapy with daratumumab, Velcade,  Revlimid, and dexamethasone followed by autologous bone marrow transplant.  Patient received weekly daratumumab for 4 cycles along with Velcade on days 1, 4, 8, and 11.  She also received Revlimid on days 1 through 14 of a 21-day cycle.  She completed cycle 4 of treatment on January 14, 2021.  Subsequent bone marrow biopsy on January 24, 2021 revealed complete remission with no evidence of myeloma.  Patient underwent autologous stem cell [facility] at [facility] on February 24, 2021.  She was readmitted to the hospital on March 04, 2021 with neutropenic fever, diarrhea, and sepsis-like symptoms.  Infectious disease work-up remains negative.   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  PLAN:      1.  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  She received consolidation with cycle 5 and 6 using daratumumab on day 1, Velcade on day 1, 4, 8, and 11, Revlimid 10 mg on days 1 through 14 with 7 days off, and weekly dexamethasone on a 21-day cycle.  She is now on maintenance daratumumab on day 1 and Revlimid 10 mg on days 1 through 21 on a 28-day cycle for cycles 7 through 32.  Repeat bone marrow biopsy on October 03, 2021 revealed no evidence of a myeloid neoplasm.  Proceed with cycle 11 of treatment today. Patient takes all of her premedications at home prior to clinic, therefore these have been removed from the treatment plan.  Proceed with Zometa as well today.  Return to clinic in 4 weeks for further evaluation and consideration of cycle 12. 2.  Pain: Improved.  She has completed XRT.  She had kyphoplasty x2.  Patient now takes 100 mg tramadol in the morning, 50 mg  in the afternoon, and then 1 Percocet at night.  Repeat MRI results as above with essentially no change.  Appreciate orthopedics input who recommended conservative management.   3.  Hypocalcemia: Mild.  Proceed with Zometa as above. 4.  Renal  insufficiency: Creatinine has trended up slightly to 1.37.  Monitor. 5.  Anemia: Hemoglobin is trended down to 10.7, monitor. 6.  Thrombocytopenia: Chronic and unchanged.  Patient's platelet count is 103.   7.  Leukopenia: Patient's white blood cell count has down to 2.8, monitor. 8.  Vaccinations: Continue revaccination schedule as per [facility] 9.  Transaminitis: Mild, monitor.  Patient expressed understanding and was in agreement with this plan. She also understands that She can call clinic at any time with any questions, concerns, or complaints.      Cancer Staging  Lambda light chain myeloma ([place]) Staging form: Plasma Cell Myeloma and Plasma Cell Disorders, AJCC 8th Edition - Clinical stage from 09/16/2020: RISS Stage I (Beta-2-microglobulin (mg/L): 2.5, Albumin (g/dL): 4.9, ISS: Stage I, High-risk cytogenetics: Absent, LDH: Normal) - Signed by Lloyd Huger, MD on 09/16/2020 Beta 2 microglobulin range (mg/L): Less than 3.5 Albumin range (g/dL): Greater than or equal to 3.5 Cytogenetics: 1p deletion  Lloyd Huger, MD   12/20/2021 9:42 AM

## 2021-12-20 NOTE — Progress Notes (Signed)
Ca =8.4.  Proceed with zometa today per MD

## 2021-12-20 NOTE — Patient Instructions (Signed)
[facility] CANCER CTR AT [place]  Discharge Instructions: Thank you for choosing [facility] to provide your oncology and hematology care.  If you have a lab appointment with the [facility], please go directly to the [facility] and check in at the registration area.  Wear comfortable clothing and clothing appropriate for easy access to any Portacath or PICC line.   We strive to give you quality time with your provider. You may need to reschedule your appointment if you arrive late (15 or more minutes).  Arriving late affects you and other patients whose appointments are after yours.  Also, if you miss three or more appointments without notifying the office, you may be dismissed from the clinic at the provider's discretion.      For prescription refill requests, have your pharmacy contact our office and allow 72 hours for refills to be completed.    Today you received the following chemotherapy and/or immunotherapy agents: Darzalex faspro      To help prevent nausea and vomiting after your treatment, we encourage you to take your nausea medication as directed.  BELOW ARE SYMPTOMS THAT SHOULD BE REPORTED IMMEDIATELY: *FEVER GREATER THAN 100.4 F (38 C) OR HIGHER *CHILLS OR SWEATING *NAUSEA AND VOMITING THAT IS NOT CONTROLLED WITH YOUR NAUSEA MEDICATION *UNUSUAL SHORTNESS OF BREATH *UNUSUAL BRUISING OR BLEEDING *URINARY PROBLEMS (pain or burning when urinating, or frequent urination) *BOWEL PROBLEMS (unusual diarrhea, constipation, pain near the anus) TENDERNESS IN MOUTH AND THROAT WITH OR WITHOUT PRESENCE OF ULCERS (sore throat, sores in mouth, or a toothache) UNUSUAL RASH, SWELLING OR PAIN  UNUSUAL VAGINAL DISCHARGE OR ITCHING   Items with * indicate a potential emergency and should be followed up as soon as possible or go to the Emergency Department if any problems should occur.  Please show the CHEMOTHERAPY ALERT CARD or IMMUNOTHERAPY ALERT CARD at  check-in to the Emergency Department and triage nurse.  Should you have questions after your visit or need to cancel or reschedule your appointment, please contact [facility] CANCER [place] AT [place]  [phone] and follow the prompts.  Office hours are 8:00 a.m. to 4:30 p.m. Monday - Friday. Please note that voicemails left after 4:00 p.m. may not be returned until the following business day.  We are closed weekends and major holidays. You have access to a nurse at all times for urgent questions. Please call the main number to the clinic [phone] and follow the prompts.  For any non-urgent questions, you may also contact your provider using MyChart. We now offer e-Visits for anyone 38 and older to request care online for non-urgent symptoms. For details visit [email or web address].   Also download the MyChart app! Go to the app store, search "MyChart", open the app, select [place], and log in with your MyChart username and password.  Masks are optional in the cancer centers. If you would like for your care team to wear a mask while they are taking care of you, please let them know. For doctor visits, patients may have with them one support person who is at least 65 years old. At this time, visitors are not allowed in the infusion area.  Daratumumab; Hyaluronidase Injection What is this medication? DARATUMUMAB; HYALURONIDASE (dar a toom ue mab; hye al ur ON i dase) treats multiple myeloma, a type of bone marrow cancer. Daratumumab works by blocking a protein that causes cancer cells to grow and multiply. This helps to slow or stop the spread of cancer cells. Hyaluronidase  works by increasing the absorption of other medications in the body to help them work better. This medication may also be used treat amyloidosis, a condition that causes the buildup of a protein (amyloid) in your body. It works by reducing the buildup of this protein, which decreases symptoms. It is a combination  medication that contains a monoclonal antibody. This medicine may be used for other purposes; ask your health care provider or pharmacist if you have questions. COMMON BRAND NAME(S): DARZALEX FASPRO What should I tell my care team before I take this medication? They need to know if you have any of these conditions: Heart disease Infection, such as chickenpox, cold sores, herpes, hepatitis B Lung or breathing disease An unusual or allergic reaction to daratumumab, hyaluronidase, other medications, foods, dyes, or preservatives Pregnant or trying to get pregnant Breast-feeding How should I use this medication? This medication is injected under the skin. It is given by your care team in a hospital or clinic setting. Talk to your care team about the use of this medication in children. Special care may be needed. Overdosage: If you think you have taken too much of this medicine contact a poison control center or emergency room at once. NOTE: This medicine is only for you. Do not share this medicine with others. What if I miss a dose? Keep appointments for follow-up doses. It is important not to miss your dose. Call your care team if you are unable to keep an appointment. What may interact with this medication? Interactions have not been studied. This list may not describe all possible interactions. Give your health care provider a list of all the medicines, herbs, non-prescription drugs, or dietary supplements you use. Also tell them if you smoke, drink alcohol, or use illegal drugs. Some items may interact with your medicine. What should I watch for while using this medication? Your condition will be monitored carefully while you are receiving this medication. This medication can cause serious allergic reactions. To reduce your risk, your care team may give you other medication to take before receiving this one. Be sure to follow the directions from your care team. This medication can affect the  results of blood tests to match your blood type. These changes can last for up to 6 months after the final dose. Your care team will do blood tests to match your blood type before you start treatment. Tell all of your care team that you are being treated with this medication before receiving a blood transfusion. This medication can affect the results of some tests used to determine treatment response; extra tests may be needed to evaluate response. Talk to your care team if you wish to become pregnant or think you are pregnant. This medication can cause serious birth defects if taken during pregnancy and for 3 months after the last dose. A reliable form of contraception is recommended while taking this medication and for 3 months after the last dose. Talk to your care team about effective forms of contraception. Do not breast-feed while taking this medication. What side effects may I notice from receiving this medication? Side effects that you should report to your care team as soon as possible: Allergic reactions--skin rash, itching, hives, swelling of the face, lips, tongue, or throat Heart rhythm changes--fast or irregular heartbeat, dizziness, feeling faint or lightheaded, chest pain, trouble breathing Infection--fever, chills, cough, sore throat, wounds that don't heal, pain or trouble when passing urine, general feeling of discomfort or being unwell Infusion reactions--chest pain,   shortness of breath or trouble breathing, feeling faint or lightheaded Sudden eye pain or change in vision such as blurry vision, seeing halos around lights, vision loss Unusual bruising or bleeding Side effects that usually do not require medical attention (report to your care team if they continue or are bothersome): Constipation Diarrhea Fatigue Nausea Pain, tingling, or numbness in the hands or feet Swelling of the ankles, hands, or feet This list may not describe all possible side effects. Call your doctor for  medical advice about side effects. You may report side effects to FDA at 1-800-FDA-1088. Where should I keep my medication? This medication is given in a hospital or clinic. It will not be stored at home. NOTE: This sheet is a summary. It may not cover all possible information. If you have questions about this medicine, talk to your doctor, pharmacist, or health care provider.  2023 Elsevier/Gold Standard (2021-06-08 00:00:00) 

## 2021-12-21 LAB — KAPPA/LAMBDA LIGHT CHAINS
Kappa free light chain: 12.6 mg/L (ref 3.3–19.4)
Kappa, lambda light chain ratio: 1.97 — ABNORMAL HIGH (ref 0.26–1.65)
Lambda free light chains: 6.4 mg/L (ref 5.7–26.3)

## 2021-12-22 ENCOUNTER — Other Ambulatory Visit: Payer: Self-pay | Admitting: Oncology

## 2021-12-22 DIAGNOSIS — C9000 Multiple myeloma not having achieved remission: Secondary | ICD-10-CM

## 2021-12-26 ENCOUNTER — Other Ambulatory Visit: Payer: Self-pay | Admitting: Oncology

## 2021-12-26 DIAGNOSIS — C9000 Multiple myeloma not having achieved remission: Secondary | ICD-10-CM

## 2021-12-28 ENCOUNTER — Telehealth: Payer: Self-pay | Admitting: *Deleted

## 2021-12-28 ENCOUNTER — Other Ambulatory Visit: Payer: Self-pay | Admitting: *Deleted

## 2021-12-28 ENCOUNTER — Encounter: Payer: Self-pay | Admitting: Oncology

## 2021-12-28 ENCOUNTER — Other Ambulatory Visit: Payer: Self-pay

## 2021-12-28 DIAGNOSIS — C9000 Multiple myeloma not having achieved remission: Secondary | ICD-10-CM

## 2021-12-28 MED ORDER — LENALIDOMIDE 10 MG PO CAPS: ORAL_CAPSULE | ORAL | 0 refills | Status: DC

## 2021-12-28 NOTE — Telephone Encounter (Signed)
Physician signed prescription before the auth # was done and so new prescription wit auth # is needed Please resend  with all info needed

## 2021-12-28 NOTE — Telephone Encounter (Addendum)
Patient called reporting that yesterday she started feeling bad with cold type symptoms. She has a sore throat, cough and congestion, temp last night was 100.2 this morning it is 100.9 and she feels worse. She did do a COVID test yesterday and it was negative. She is asking if she needs to be seen or if she should call her PCP for this. She is on maintenance chemotherapy. Please advise

## 2021-12-28 NOTE — Telephone Encounter (Signed)
Per Judson Roch, she recommends patient calling her PCP to further evaluate.

## 2021-12-28 NOTE — Telephone Encounter (Signed)
Patient advised to contact her PCP. She agreed to call PCP

## 2021-12-28 NOTE — Telephone Encounter (Signed)
Auth number and date added and resent rx to pharmacy.

## 2021-12-29 ENCOUNTER — Other Ambulatory Visit: Payer: Self-pay | Admitting: Oncology

## 2022-01-16 ENCOUNTER — Other Ambulatory Visit: Payer: Self-pay | Admitting: Oncology

## 2022-01-16 DIAGNOSIS — R202 Paresthesia of skin: Secondary | ICD-10-CM

## 2022-01-17 ENCOUNTER — Inpatient Hospital Stay: Payer: Medicare PPO | Attending: Oncology

## 2022-01-17 ENCOUNTER — Encounter: Payer: Self-pay | Admitting: Oncology

## 2022-01-17 ENCOUNTER — Inpatient Hospital Stay: Payer: Medicare PPO

## 2022-01-17 ENCOUNTER — Inpatient Hospital Stay: Payer: Medicare PPO | Admitting: Oncology

## 2022-01-17 VITALS — BP 131/67 | HR 92 | Temp 98.8°F | Resp 16 | Ht 61.0 in | Wt 133.8 lb

## 2022-01-17 DIAGNOSIS — C9000 Multiple myeloma not having achieved remission: Secondary | ICD-10-CM

## 2022-01-17 DIAGNOSIS — Z5112 Encounter for antineoplastic immunotherapy: Secondary | ICD-10-CM | POA: Insufficient documentation

## 2022-01-17 DIAGNOSIS — Z79899 Other long term (current) drug therapy: Secondary | ICD-10-CM | POA: Diagnosis not present

## 2022-01-17 DIAGNOSIS — G629 Polyneuropathy, unspecified: Secondary | ICD-10-CM | POA: Insufficient documentation

## 2022-01-17 DIAGNOSIS — C9001 Multiple myeloma in remission: Secondary | ICD-10-CM | POA: Diagnosis present

## 2022-01-17 DIAGNOSIS — Z923 Personal history of irradiation: Secondary | ICD-10-CM | POA: Diagnosis not present

## 2022-01-17 DIAGNOSIS — D72819 Decreased white blood cell count, unspecified: Secondary | ICD-10-CM | POA: Insufficient documentation

## 2022-01-17 DIAGNOSIS — D649 Anemia, unspecified: Secondary | ICD-10-CM | POA: Insufficient documentation

## 2022-01-17 DIAGNOSIS — M549 Dorsalgia, unspecified: Secondary | ICD-10-CM | POA: Insufficient documentation

## 2022-01-17 LAB — COMPREHENSIVE METABOLIC PANEL
ALT: 32 U/L (ref 0–44)
AST: 40 U/L (ref 15–41)
Albumin: 3.6 g/dL (ref 3.5–5.0)
Alkaline Phosphatase: 107 U/L (ref 38–126)
Anion gap: 7 (ref 5–15)
BUN: 15 mg/dL (ref 8–23)
CO2: 22 mmol/L (ref 22–32)
Calcium: 8.6 mg/dL — ABNORMAL LOW (ref 8.9–10.3)
Chloride: 107 mmol/L (ref 98–111)
Creatinine, Ser: 1.38 mg/dL — ABNORMAL HIGH (ref 0.44–1.00)
GFR, Estimated: 42 mL/min — ABNORMAL LOW (ref >60)
Glucose, Bld: 230 mg/dL — ABNORMAL HIGH (ref 70–99)
Potassium: 4 mmol/L (ref 3.5–5.1)
Sodium: 136 mmol/L (ref 135–145)
Total Bilirubin: 0.5 mg/dL (ref 0.3–1.2)
Total Protein: 6.2 g/dL — ABNORMAL LOW (ref 6.5–8.1)

## 2022-01-17 LAB — CBC WITH DIFFERENTIAL/PLATELET
Abs Immature Granulocytes: 0.01 10*3/uL (ref 0.00–0.07)
Basophils Absolute: 0 10*3/uL (ref 0.0–0.1)
Basophils Relative: 1 %
Eosinophils Absolute: 0.2 10*3/uL (ref 0.0–0.5)
Eosinophils Relative: 7 %
HCT: 33.2 % — ABNORMAL LOW (ref 36.0–46.0)
Hemoglobin: 10.5 g/dL — ABNORMAL LOW (ref 12.0–15.0)
Immature Granulocytes: 0 %
Lymphocytes Relative: 10 %
Lymphs Abs: 0.3 10*3/uL — ABNORMAL LOW (ref 0.7–4.0)
MCH: 31.5 pg (ref 26.0–34.0)
MCHC: 31.6 g/dL (ref 30.0–36.0)
MCV: 99.7 fL (ref 80.0–100.0)
Monocytes Absolute: 0.2 10*3/uL (ref 0.1–1.0)
Monocytes Relative: 7 %
Neutro Abs: 2.1 10*3/uL (ref 1.7–7.7)
Neutrophils Relative %: 75 %
Platelets: 83 10*3/uL — ABNORMAL LOW (ref 150–400)
RBC: 3.33 MIL/uL — ABNORMAL LOW (ref 3.87–5.11)
RDW: 15.4 % (ref 11.5–15.5)
WBC: 2.8 10*3/uL — ABNORMAL LOW (ref 4.0–10.5)
nRBC: 0 % (ref 0.0–0.2)

## 2022-01-17 MED ORDER — ZOLEDRONIC ACID 4 MG/5ML IV CONC
3.3000 mg | Freq: Once | INTRAVENOUS | Status: AC
  Administered 2022-01-17: 3.3 mg via INTRAVENOUS
  Filled 2022-01-17: qty 4.13

## 2022-01-17 MED ORDER — SODIUM CHLORIDE 0.9 % IV SOLN
INTRAVENOUS | Status: DC
  Filled 2022-01-17: qty 250

## 2022-01-17 MED ORDER — HEPARIN SOD (PORK) LOCK FLUSH 100 UNIT/ML IV SOLN
500.0000 [IU] | Freq: Once | INTRAVENOUS | Status: AC
  Administered 2022-01-17: 500 [IU] via INTRAVENOUS
  Filled 2022-01-17: qty 5

## 2022-01-17 NOTE — Progress Notes (Signed)
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  INTERVAL HISTORY: Patient returns to clinic today for further evaluation and consideration of cycle 12 of maintenance daratumumab and Revlimid.  Her back pain continues to be well controlled with tramadol only and patient has discontinued Percocet. Her peripheral neuropathy is essentially unchanged.  She has no other neurologic complaints.  She denies any recent fevers or illnesses.  She has no chest pain, shortness of breath, cough, or hemoptysis.  She denies any nausea, vomiting, constipation, or diarrhea.  She has no urinary complaints.  Patient offers no further specific complaints today.  REVIEW OF SYSTEMS:   Review of Systems  Constitutional: Negative.  Negative for fever, malaise/fatigue and weight loss.  Respiratory: Negative.  Negative for cough, hemoptysis and shortness of breath.   Cardiovascular:  Negative for chest pain and leg swelling.  Gastrointestinal: Negative.  Negative for abdominal pain, diarrhea and nausea.  Genitourinary: Negative.  Negative for dysuria.  Musculoskeletal:  Positive for back pain. Negative for joint pain and neck pain.  Skin: Negative.  Negative for rash.  Neurological:  Positive for sensory change. Negative for dizziness, tremors, focal weakness, weakness and headaches.  Psychiatric/Behavioral: Negative.  The patient is not nervous/anxious.     As per HPI. Otherwise, a complete review of systems is negative.  PAST MEDICAL HISTORY: Past Medical History:  Diagnosis Date   Anxiety    Diabetes mellitus without complication ([place]) 7121   GERD (gastroesophageal reflux disease)    Hyperlipidemia     Myeloma ([place])    Myeloma ([place])    Personal history of colonic polyps    Squamous cell carcinoma of skin 11/25/2013   Left chest. WD SCC with superficial infiltration.   Tachycardia     PAST SURGICAL HISTORY: Past Surgical History:  Procedure Laterality Date   AUGMENTATION MAMMAPLASTY Bilateral 9758   silicone and replacement in 2001   [place], [place], 1998,2003, 2008, 2013   COLONOSCOPY WITH PROPOFOL N/A 08/16/2016   Procedure: COLONOSCOPY WITH PROPOFOL;  Surgeon: Robert Bellow, MD;  Location: [facility] ENDOSCOPY;  Service: Endoscopy;  Laterality: N/A;   COLONOSCOPY WITH PROPOFOL N/A 10/05/2021   Procedure: COLONOSCOPY WITH PROPOFOL;  Surgeon: Robert Bellow, MD;  Location: [facility] ENDOSCOPY;  Service: Endoscopy;  Laterality: N/A;  HAS A PORT, NEEDS AMPICILLIN PER OFFICE   DILATION AND CURETTAGE OF UTERUS     ENDOMETRIAL ABLATION  12/1991   ganglion cyst removal      IR IMAGING GUIDED PORT INSERTION  10/27/2020   IR IMAGING GUIDED PORT INSERTION  04/04/2021   KYPHOPLASTY N/A 10/28/2020   Procedure: T12 and L3 KYPHOPLASTY;  Surgeon: Hessie Knows, MD;  Location: [facility] ORS;  Service: Orthopedics;  Laterality: N/A;   TONSILLECTOMY     WRIST SURGERY Right 05/1995    FAMILY HISTORY: Family History  Problem Relation Age of Onset   Colon polyps Sister    Colon cancer Father 66   Diabetes Mother    Breast cancer Neg Hx     ADVANCED DIRECTIVES (Y/N):  N  HEALTH MAINTENANCE: Social History   Tobacco Use   Smoking status: Former  Packs/day: 1.00    Years: 4.00    Total pack years: 4.00    Types: Cigarettes    Quit date: 77    Years since quitting: 41.9   Smokeless tobacco: Never  Vaping Use   Vaping Use: Never used  Substance Use Topics   Alcohol use: Yes    Alcohol/week: 1.0 - 2.0 standard drink of alcohol    Types: 1 - 2 Glasses of wine per week    Comment: occassionally   Drug use: No      Colonoscopy:  PAP:  Bone density:  Lipid panel:  No Known Allergies  Current Outpatient Medications  Medication Sig Dispense Refill   acetaminophen (TYLENOL) 325 MG tablet Take 650 mg by mouth 2 (two) times a week. Premed for velcade. Taking Tuesdays and Friday     acyclovir (ZOVIRAX) 400 MG tablet TAKE 1 TABLET BY MOUTH TWICE DAILY 60 tablet 5   ALPRAZolam (XANAX) 0.25 MG tablet TAKE 1 TABLET BY MOUTH AT BEDTIME AS NEEDED FOR SLEEP 30 tablet 2   ASPIRIN 81 PO Take 81 mg by mouth daily.     atorvastatin (LIPITOR) 20 MG tablet Take 20 mg by mouth daily.     busPIRone (BUSPAR) 30 MG tablet Take 30 mg by mouth 2 (two) times daily.     CALCIUM-VITAMIN D PO Take by mouth 2 (two) times daily.     cyclobenzaprine (FLEXERIL) 10 MG tablet TAKE 1 TABLET BY MOUTH AT BEDTIME 30 tablet 2   dexamethasone (DECADRON) 4 MG tablet TAKE 5 TABLETS BY MOUTH ONCE A WEEK 20 tablet 3   diphenhydrAMINE (BENADRYL) 25 mg capsule Take 25 mg by mouth 2 (two) times a week. Pre med for velcade. Only Tuesdays and Fridays     gabapentin (NEURONTIN) 300 MG capsule TAKE 1 CAPSULE BY MOUTH TWICE DAILY 60 capsule 2   ivabradine (CORLANOR) 5 MG TABS tablet Take 5 mg by mouth 2 (two) times daily with a meal.     lenalidomide (REVLIMID) 10 MG capsule [employer] Auth # [id number]  Date Obtained 12/28/2021 21 capsule 0   lidocaine-prilocaine (EMLA) cream APPLY 1 APPLICATION TOPICALLY AS NEEDED 30 g 2   liraglutide (VICTOZA) 18 MG/3ML SOPN Inject 1.2 mg into the skin daily.     Magnesium 400 MG TABS Take 1 tablet by mouth daily.     meloxicam (MOBIC) 15 MG tablet Take 15 mg by mouth daily.     methocarbamol (ROBAXIN) 500 MG tablet Take 1 tablet (500 mg total) by mouth every 6 (six) hours as needed for muscle spasms. 40 tablet 1   Multiple Vitamin (MULTI-VITAMIN) tablet Take 1 tablet by mouth daily.     omeprazole (PRILOSEC) 40 MG capsule Take 40 mg by mouth daily.     ondansetron (ZOFRAN) 8 MG tablet Take 1 tablet (8 mg total)  by mouth 2 (two) times daily as needed (Nausea or vomiting). 60 tablet 2   oxyCODONE-acetaminophen (PERCOCET/ROXICET) 5-325 MG tablet TAKE 1-2 TABLETS BY MOUTH EVERY 4 HOURS AS NEEDED FOR SEVERE PAIN 90 tablet 0   prochlorperazine (COMPAZINE) 10 MG tablet TAKE 1 TABLET BY MOUTH EVERY 6 HOURS AS NEEDED FOR NAUSEA OR VOMITING 60 tablet 2   traMADol (ULTRAM) 50 MG tablet Take 1 tablet (50 mg total) by mouth every 6 (six) hours as needed. Take 1 to 2 tablets (100 mg total) as needed for pain. 120 tablet 2   diphenoxylate-atropine (LOMOTIL) 2.5-0.025 MG tablet Take by mouth as needed. (Patient not taking: Reported  on 08/02/2021)     No current facility-administered medications for this visit.   Facility-Administered Medications Ordered in Other Visits  Medication Dose Route Frequency Provider Last Rate Last Admin   0.9 %  sodium chloride infusion   Intravenous Continuous Lloyd Huger, MD   Stopped at 04/07/21 1142   0.9 %  sodium chloride infusion   Intravenous Continuous Lloyd Huger, MD 20 mL/hr at 01/17/22 0938 New Bag at 01/17/22 0938   [COMPLETED] heparin lock flush 100 unit/mL  500 Units Intravenous Once Lloyd Huger, MD   500 Units at 01/17/22 1040   sodium chloride flush (NS) 0.9 % injection 10 mL  10 mL Intravenous PRN Lloyd Huger, MD   10 mL at 11/23/20 0932   zoledronic acid (ZOMETA) 3.3 mg in sodium chloride 0.9 % 100 mL IVPB  3.3 mg Intravenous Once Lloyd Huger, MD        OBJECTIVE: Vitals:   01/17/22 0853  BP: 131/67  Pulse: 92  Resp: 16  Temp: 98.8 F (37.1 C)  SpO2: 100%     Body mass index is 25.28 kg/m.    ECOG FS:0 - Asymptomatic  General: Well-developed, well-nourished, no acute distress. Eyes: Pink conjunctiva, anicteric sclera. HEENT: Normocephalic, moist mucous membranes. Lungs: No audible wheezing or coughing. Heart: Regular rate and rhythm. Abdomen: Soft, nontender, no obvious distention. Musculoskeletal: No edema, cyanosis, or  clubbing. Neuro: Alert, answering all questions appropriately. Cranial nerves grossly intact. Skin: No rashes or petechiae noted. Psych: Normal affect.  LAB RESULTS:  Lab Results  Component Value Date   NA 138 12/20/2021   K 4.0 12/20/2021   CL 110 12/20/2021   CO2 21 (L) 12/20/2021   GLUCOSE 222 (H) 12/20/2021   BUN 21 12/20/2021   CREATININE 1.37 (H) 12/20/2021   CALCIUM 8.4 (L) 12/20/2021   PROT 6.2 (L) 12/20/2021   ALBUMIN 3.7 12/20/2021   AST 43 (H) 12/20/2021   ALT 55 (H) 12/20/2021   ALKPHOS 111 12/20/2021   BILITOT 0.5 12/20/2021   GFRNONAA 43 (L) 12/20/2021    Lab Results  Component Value Date   WBC 2.8 (L) 01/17/2022   NEUTROABS 2.1 01/17/2022   HGB 10.5 (L) 01/17/2022   HCT 33.2 (L) 01/17/2022   MCV 99.7 01/17/2022   PLT 83 (L) 01/17/2022     STUDIES: No results found.  ONCOLOGY HISTORY:  Diagnosis confirmed from bone biopsy on August 17, 2020.  Initially her lambda free light chains were elevated at 432.1, but now are within normal limits at 12.1.  Immunoglobulins and SPEP are normal.  Bone marrow biopsy completed on August 26, 2020 revealed 25% plasma cells along with the deletion of 1p which is considered poor prognosis.  PET scan results from September 06, 2020 reviewed independently with 3 hypermetabolic lesions in right C5-6 facets, right iliac crest lesion, and L3 vertebral lesion.  After lengthy discussion with the patient, she agreed to pursue chemotherapy with daratumumab, Velcade, Revlimid, and dexamethasone followed by autologous bone marrow transplant.  Patient received weekly daratumumab for 4 cycles along with Velcade on days 1, 4, 8, and 11.  She also received Revlimid on days 1 through 14 of a 21-day cycle.  She completed cycle 4 of treatment on January 14, 2021.  Subsequent bone marrow biopsy on January 24, 2021 revealed complete remission with no evidence of myeloma.  Patient underwent autologous stem cell [facility] on February 24, 2021.  She was readmitted to the hospital  on March 04, 2021 with neutropenic fever, diarrhea, and sepsis-like symptoms.  Infectious disease work-up remains negative.   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  PLAN:      1.  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  She received consolidation with cycle 5 and 6 using daratumumab on day 1, Velcade on day 1, 4, 8, and 11, Revlimid 10 mg on days 1 through 14 with 7 days off, and weekly dexamethasone on a 21-day cycle.  She is now on maintenance daratumumab on day 1 and Revlimid 10 mg on days 1 through 21 on a 28-day cycle for cycles 7 through 32.  Repeat bone marrow biopsy on October 03, 2021 revealed no evidence of a myeloid neoplasm.  Delay cycle 12 of treatment today secondary to thrombocytopenia.  She has been instructed to hold Revlimid and will delay daratumumab 1 week. Patient takes all of her premedications at home prior to clinic, therefore these have been removed from the treatment plan.  Proceed with Zometa today.  Return to clinic in 1 week for further evaluation and reconsideration of cycle 12.   2.  Pain: Improved.  She has completed XRT.  She had kyphoplasty x2.  Continue tramadol.  Patient has now discontinued Percocet.  Repeat MRI results as above with essentially no change.  Appreciate orthopedics input who recommended conservative management.   3.  Hypocalcemia: Chronic and unchanged.  Proceed with Zometa as above. 4.  Renal insufficiency: Chronic and unchanged.  Patient's creatinine is 1.38 today.. 5.  Anemia: Chronic and change.  Patient's hemoglobin is 10.5. 6.  Thrombocytopenia: Platelet count decreased 83.  Hold treatment as above. 7.  Leukopenia: Chronic and unchanged.  Hold treatment as above. 8.  Vaccinations: Continue revaccination schedule as per [facility]. 9.  Transaminitis: Resolved.  Patient expressed understanding  and was in agreement with this plan. She also understands that She can call clinic at any time with any questions, concerns, or complaints.      Cancer Staging  Lambda light chain myeloma ([place]) Staging form: Plasma Cell Myeloma and Plasma Cell Disorders, AJCC 8th Edition - Clinical stage from 09/16/2020: RISS Stage I (Beta-2-microglobulin (mg/L): 2.5, Albumin (g/dL): 4.9, ISS: Stage I, High-risk cytogenetics: Absent, LDH: Normal) - Signed by Lloyd Huger, MD on 09/16/2020 Beta 2 microglobulin range (mg/L): Less than 3.5 Albumin range (g/dL): Greater than or equal to 3.5 Cytogenetics: 1p deletion  Lloyd Huger, MD   01/17/2022 9:46 AM

## 2022-01-17 NOTE — Patient Instructions (Signed)
[facility]  Discharge Instructions: Thank you for choosing [facility] to provide your oncology and hematology care.  If you have a lab appointment with the Cancer Center, please go directly to the Cancer Center and check in at the registration area.  Wear comfortable clothing and clothing appropriate for easy access to any Portacath or PICC line.   We strive to give you quality time with your provider. You may need to reschedule your appointment if you arrive late (15 or more minutes).  Arriving late affects you and other patients whose appointments are after yours.  Also, if you miss three or more appointments without notifying the office, you may be dismissed from the clinic at the provider's discretion.      For prescription refill requests, have your pharmacy contact our office and allow 72 hours for refills to be completed.    Today you received the following chemotherapy and/or immunotherapy agents Zometa.      To help prevent nausea and vomiting after your treatment, we encourage you to take your nausea medication as directed.  BELOW ARE SYMPTOMS THAT SHOULD BE REPORTED IMMEDIATELY: *FEVER GREATER THAN 100.4 F (38 C) OR HIGHER *CHILLS OR SWEATING *NAUSEA AND VOMITING THAT IS NOT CONTROLLED WITH YOUR NAUSEA MEDICATION *UNUSUAL SHORTNESS OF BREATH *UNUSUAL BRUISING OR BLEEDING *URINARY PROBLEMS (pain or burning when urinating, or frequent urination) *BOWEL PROBLEMS (unusual diarrhea, constipation, pain near the anus) TENDERNESS IN MOUTH AND THROAT WITH OR WITHOUT PRESENCE OF ULCERS (sore throat, sores in mouth, or a toothache) UNUSUAL RASH, SWELLING OR PAIN  UNUSUAL VAGINAL DISCHARGE OR ITCHING   Items with * indicate a potential emergency and should be followed up as soon as possible or go to the Emergency Department if any problems should occur.  Please show the CHEMOTHERAPY ALERT CARD or IMMUNOTHERAPY ALERT CARD at check-in to the  Emergency Department and triage nurse.  Should you have questions after your visit or need to cancel or reschedule your appointment, please contact [facility]  [phone] and follow the prompts.  Office hours are 8:00 a.m. to 4:30 p.m. Monday - Friday. Please note that voicemails left after 4:00 p.m. may not be returned until the following business day.  We are closed weekends and major holidays. You have access to a nurse at all times for urgent questions. Please call the main number to the clinic [phone] and follow the prompts.  For any non-urgent questions, you may also contact your provider using MyChart. We now offer e-Visits for anyone 18 and older to request care online for non-urgent symptoms. For details visit [email or web address].   Also download the MyChart app! Go to the app store, search "MyChart", open the app, select [place], and log in with your MyChart username and password.  Masks are optional in the cancer centers. If you would like for your care team to wear a mask while they are taking care of you, please let them know. For doctor visits, patients may have with them one support person who is at least 65 years old. At this time, visitors are not allowed in the infusion area.   

## 2022-01-18 LAB — KAPPA/LAMBDA LIGHT CHAINS
Kappa free light chain: 13.2 mg/L (ref 3.3–19.4)
Kappa, lambda light chain ratio: 2.16 — ABNORMAL HIGH (ref 0.26–1.65)
Lambda free light chains: 6.1 mg/L (ref 5.7–26.3)

## 2022-01-23 ENCOUNTER — Encounter: Payer: Self-pay | Admitting: Oncology

## 2022-01-23 ENCOUNTER — Inpatient Hospital Stay: Payer: Medicare PPO | Admitting: Oncology

## 2022-01-23 ENCOUNTER — Inpatient Hospital Stay: Payer: Medicare PPO

## 2022-01-23 ENCOUNTER — Other Ambulatory Visit: Payer: Self-pay | Admitting: *Deleted

## 2022-01-23 DIAGNOSIS — C9000 Multiple myeloma not having achieved remission: Secondary | ICD-10-CM

## 2022-01-23 DIAGNOSIS — Z5112 Encounter for antineoplastic immunotherapy: Secondary | ICD-10-CM | POA: Diagnosis not present

## 2022-01-23 DIAGNOSIS — E119 Type 2 diabetes mellitus without complications: Secondary | ICD-10-CM

## 2022-01-23 DIAGNOSIS — E785 Hyperlipidemia, unspecified: Secondary | ICD-10-CM

## 2022-01-23 LAB — CBC WITH DIFFERENTIAL/PLATELET
Abs Immature Granulocytes: 0.01 10*3/uL (ref 0.00–0.07)
Basophils Absolute: 0 10*3/uL (ref 0.0–0.1)
Basophils Relative: 1 %
Eosinophils Absolute: 0.3 10*3/uL (ref 0.0–0.5)
Eosinophils Relative: 8 %
HCT: 36.3 % (ref 36.0–46.0)
Hemoglobin: 11.8 g/dL — ABNORMAL LOW (ref 12.0–15.0)
Immature Granulocytes: 0 %
Lymphocytes Relative: 16 %
Lymphs Abs: 0.5 10*3/uL — ABNORMAL LOW (ref 0.7–4.0)
MCH: 32.8 pg (ref 26.0–34.0)
MCHC: 32.5 g/dL (ref 30.0–36.0)
MCV: 100.8 fL — ABNORMAL HIGH (ref 80.0–100.0)
Monocytes Absolute: 0.3 10*3/uL (ref 0.1–1.0)
Monocytes Relative: 10 %
Neutro Abs: 2 10*3/uL (ref 1.7–7.7)
Neutrophils Relative %: 65 %
Platelets: 109 10*3/uL — ABNORMAL LOW (ref 150–400)
RBC: 3.6 MIL/uL — ABNORMAL LOW (ref 3.87–5.11)
RDW: 16 % — ABNORMAL HIGH (ref 11.5–15.5)
WBC: 3.1 10*3/uL — ABNORMAL LOW (ref 4.0–10.5)
nRBC: 0 % (ref 0.0–0.2)

## 2022-01-23 LAB — COMPREHENSIVE METABOLIC PANEL
ALT: 28 U/L (ref 0–44)
AST: 28 U/L (ref 15–41)
Albumin: 4.1 g/dL (ref 3.5–5.0)
Alkaline Phosphatase: 105 U/L (ref 38–126)
Anion gap: 8 (ref 5–15)
BUN: 19 mg/dL (ref 8–23)
CO2: 25 mmol/L (ref 22–32)
Calcium: 9.6 mg/dL (ref 8.9–10.3)
Chloride: 101 mmol/L (ref 98–111)
Creatinine, Ser: 1.46 mg/dL — ABNORMAL HIGH (ref 0.44–1.00)
GFR, Estimated: 40 mL/min — ABNORMAL LOW (ref >60)
Glucose, Bld: 141 mg/dL — ABNORMAL HIGH (ref 70–99)
Potassium: 3.9 mmol/L (ref 3.5–5.1)
Sodium: 134 mmol/L — ABNORMAL LOW (ref 135–145)
Total Bilirubin: 0.5 mg/dL (ref 0.3–1.2)
Total Protein: 7 g/dL (ref 6.5–8.1)

## 2022-01-23 LAB — LIPID PANEL
Cholesterol: 185 mg/dL (ref 0–200)
HDL: 88 mg/dL (ref >40)
LDL Cholesterol: 59 mg/dL (ref 0–99)
Total CHOL/HDL Ratio: 2.1 RATIO
Triglycerides: 188 mg/dL — ABNORMAL HIGH (ref <150)
VLDL: 38 mg/dL (ref 0–40)

## 2022-01-23 MED ORDER — SODIUM CHLORIDE 0.9% FLUSH
3.0000 mL | Freq: Once | INTRAVENOUS | Status: AC | PRN
  Administered 2022-01-23: 10 mL
  Filled 2022-01-23: qty 3

## 2022-01-23 NOTE — Progress Notes (Signed)
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  INTERVAL HISTORY: Patient returns to clinic today for further evaluation and reconsideration of cycle 12 of maintenance daratumumab and Revlimid.  She currently feels well and is asymptomatic.  Her back pain is well controlled with tramadol.  Her peripheral neuropathy is essentially unchanged.  She has no other neurologic complaints.  She denies any recent fevers or illnesses.  She has no chest pain, shortness of breath, cough, or hemoptysis.  She denies any nausea, vomiting, constipation, or diarrhea.  She has no urinary complaints.  Patient offers no further specific complaints today.  REVIEW OF SYSTEMS:   Review of Systems  Constitutional: Negative.  Negative for fever, malaise/fatigue and weight loss.  Respiratory: Negative.  Negative for cough, hemoptysis and shortness of breath.   Cardiovascular:  Negative for chest pain and leg swelling.  Gastrointestinal: Negative.  Negative for abdominal pain, diarrhea and nausea.  Genitourinary: Negative.  Negative for dysuria.  Musculoskeletal:  Positive for back pain. Negative for joint pain and neck pain.  Skin: Negative.  Negative for rash.  Neurological:  Positive for sensory change. Negative for dizziness, tremors, focal weakness, weakness and headaches.  Psychiatric/Behavioral: Negative.  The patient is not nervous/anxious.     As per HPI. Otherwise, a complete review of systems is negative.  PAST MEDICAL HISTORY: Past Medical History:  Diagnosis Date   Anxiety    Diabetes mellitus without complication ([place]) 9563   GERD (gastroesophageal reflux disease)    Hyperlipidemia     Myeloma ([place])    Myeloma ([place])    Personal history of colonic polyps    Squamous cell carcinoma of skin 11/25/2013   Left chest. WD SCC with superficial infiltration.   Tachycardia     PAST SURGICAL HISTORY: Past Surgical History:  Procedure Laterality Date   AUGMENTATION MAMMAPLASTY Bilateral 8756   silicone and replacement in 2001   [place], [place], 1998,2003, 2008, 2013   COLONOSCOPY WITH PROPOFOL N/A 08/16/2016   Procedure: COLONOSCOPY WITH PROPOFOL;  Surgeon: Robert Bellow, MD;  Location: [facility] ENDOSCOPY;  Service: Endoscopy;  Laterality: N/A;   COLONOSCOPY WITH PROPOFOL N/A 10/05/2021   Procedure: COLONOSCOPY WITH PROPOFOL;  Surgeon: Robert Bellow, MD;  Location: [facility] ENDOSCOPY;  Service: Endoscopy;  Laterality: N/A;  HAS A PORT, NEEDS AMPICILLIN PER OFFICE   DILATION AND CURETTAGE OF UTERUS     ENDOMETRIAL ABLATION  12/1991   ganglion cyst removal      IR IMAGING GUIDED PORT INSERTION  10/27/2020   IR IMAGING GUIDED PORT INSERTION  04/04/2021   KYPHOPLASTY N/A 10/28/2020   Procedure: T12 and L3 KYPHOPLASTY;  Surgeon: Hessie Knows, MD;  Location: [facility] ORS;  Service: Orthopedics;  Laterality: N/A;   TONSILLECTOMY     WRIST SURGERY Right 05/1995    FAMILY HISTORY: Family History  Problem Relation Age of Onset   Colon polyps Sister    Colon cancer Father 12   Diabetes Mother    Breast cancer Neg Hx     ADVANCED DIRECTIVES (Y/N):  N  HEALTH MAINTENANCE: Social History   Tobacco Use   Smoking status: Former  Packs/day: 1.00    Years: 4.00    Total pack years: 4.00    Types: Cigarettes    Quit date: 77    Years since quitting: 41.9   Smokeless tobacco: Never  Vaping Use   Vaping Use: Never used  Substance Use Topics   Alcohol use: Yes    Alcohol/week: 1.0 - 2.0 standard drink of alcohol    Types: 1 - 2 Glasses of wine per week    Comment: occassionally   Drug use: No      Colonoscopy:  PAP:  Bone density:  Lipid panel:  No Known Allergies  Current Outpatient Medications  Medication Sig Dispense Refill   acetaminophen (TYLENOL) 325 MG tablet Take 650 mg by mouth 2 (two) times a week. Premed for velcade. Taking Tuesdays and Friday     acyclovir (ZOVIRAX) 400 MG tablet TAKE 1 TABLET BY MOUTH TWICE DAILY 60 tablet 5   ALPRAZolam (XANAX) 0.25 MG tablet TAKE 1 TABLET BY MOUTH AT BEDTIME AS NEEDED FOR SLEEP 30 tablet 2   ASPIRIN 81 PO Take 81 mg by mouth daily.     atorvastatin (LIPITOR) 20 MG tablet Take 20 mg by mouth daily.     busPIRone (BUSPAR) 30 MG tablet Take 30 mg by mouth 2 (two) times daily.     CALCIUM-VITAMIN D PO Take by mouth 2 (two) times daily.     cyclobenzaprine (FLEXERIL) 10 MG tablet TAKE 1 TABLET BY MOUTH AT BEDTIME 30 tablet 2   dexamethasone (DECADRON) 4 MG tablet TAKE 5 TABLETS BY MOUTH ONCE A WEEK 20 tablet 3   diphenhydrAMINE (BENADRYL) 25 mg capsule Take 25 mg by mouth 2 (two) times a week. Pre med for velcade. Only Tuesdays and Fridays     gabapentin (NEURONTIN) 300 MG capsule TAKE 1 CAPSULE BY MOUTH TWICE DAILY 60 capsule 2   ivabradine (CORLANOR) 5 MG TABS tablet Take 5 mg by mouth 2 (two) times daily with a meal.     lenalidomide (REVLIMID) 10 MG capsule [employer] Auth # [id number]  Date Obtained 12/28/2021 21 capsule 0   lidocaine-prilocaine (EMLA) cream APPLY 1 APPLICATION TOPICALLY AS NEEDED 30 g 2   liraglutide (VICTOZA) 18 MG/3ML SOPN Inject 1.2 mg into the skin daily.     Magnesium 400 MG TABS Take 1 tablet by mouth daily.     meloxicam (MOBIC) 15 MG tablet Take 15 mg by mouth daily.     methocarbamol (ROBAXIN) 500 MG tablet Take 1 tablet (500 mg total) by mouth every 6 (six) hours as needed for muscle spasms. 40 tablet 1   Multiple Vitamin (MULTI-VITAMIN) tablet Take 1 tablet by mouth daily.     omeprazole (PRILOSEC) 40 MG capsule Take 40 mg by mouth daily.     ondansetron (ZOFRAN) 8 MG tablet Take 1 tablet (8 mg total)  by mouth 2 (two) times daily as needed (Nausea or vomiting). 60 tablet 2   oxyCODONE-acetaminophen (PERCOCET/ROXICET) 5-325 MG tablet TAKE 1-2 TABLETS BY MOUTH EVERY 4 HOURS AS NEEDED FOR SEVERE PAIN 90 tablet 0   prochlorperazine (COMPAZINE) 10 MG tablet TAKE 1 TABLET BY MOUTH EVERY 6 HOURS AS NEEDED FOR NAUSEA OR VOMITING 60 tablet 2   traMADol (ULTRAM) 50 MG tablet Take 1 tablet (50 mg total) by mouth every 6 (six) hours as needed. Take 1 to 2 tablets (100 mg total) as needed for pain. 120 tablet 2   diphenoxylate-atropine (LOMOTIL) 2.5-0.025 MG tablet Take by mouth as needed. (Patient not taking: Reported  on 08/02/2021)     No current facility-administered medications for this visit.   Facility-Administered Medications Ordered in Other Visits  Medication Dose Route Frequency Provider Last Rate Last Admin   0.9 %  sodium chloride infusion   Intravenous Continuous Lloyd Huger, MD   Stopped at 04/07/21 1142   sodium chloride flush (NS) 0.9 % injection 10 mL  10 mL Intravenous PRN Lloyd Huger, MD   10 mL at 11/23/20 0932    OBJECTIVE: Vitals:   01/23/22 0939  BP: 135/70  Pulse: 90  Resp: 18  Temp: 97.9 F (36.6 C)  SpO2: 100%     Body mass index is 24.75 kg/m.    ECOG FS:0 - Asymptomatic  General: Well-developed, well-nourished, no acute distress. Eyes: Pink conjunctiva, anicteric sclera. HEENT: Normocephalic, moist mucous membranes. Lungs: No audible wheezing or coughing. Heart: Regular rate and rhythm. Abdomen: Soft, nontender, no obvious distention. Musculoskeletal: No edema, cyanosis, or clubbing. Neuro: Alert, answering all questions appropriately. Cranial nerves grossly intact. Skin: No rashes or petechiae noted. Psych: Normal affect.  LAB RESULTS:  Lab Results  Component Value Date   NA 134 (L) 01/23/2022   K 3.9 01/23/2022   CL 101 01/23/2022   CO2 25 01/23/2022   GLUCOSE 141 (H) 01/23/2022   BUN 19 01/23/2022   CREATININE 1.46 (H) 01/23/2022    CALCIUM 9.6 01/23/2022   PROT 7.0 01/23/2022   ALBUMIN 4.1 01/23/2022   AST 28 01/23/2022   ALT 28 01/23/2022   ALKPHOS 105 01/23/2022   BILITOT 0.5 01/23/2022   GFRNONAA 40 (L) 01/23/2022    Lab Results  Component Value Date   WBC 3.1 (L) 01/23/2022   NEUTROABS 2.0 01/23/2022   HGB 11.8 (L) 01/23/2022   HCT 36.3 01/23/2022   MCV 100.8 (H) 01/23/2022   PLT 109 (L) 01/23/2022     STUDIES: No results found.  ONCOLOGY HISTORY:  Diagnosis confirmed from bone biopsy on August 17, 2020.  Initially her lambda free light chains were elevated at 432.1, but now are within normal limits at 12.1.  Immunoglobulins and SPEP are normal.  Bone marrow biopsy completed on August 26, 2020 revealed 25% plasma cells along with the deletion of 1p which is considered poor prognosis.  PET scan results from September 06, 2020 reviewed independently with 3 hypermetabolic lesions in right C5-6 facets, right iliac crest lesion, and L3 vertebral lesion.  After lengthy discussion with the patient, she agreed to pursue chemotherapy with daratumumab, Velcade, Revlimid, and dexamethasone followed by autologous bone marrow transplant.  Patient received weekly daratumumab for 4 cycles along with Velcade on days 1, 4, 8, and 11.  She also received Revlimid on days 1 through 14 of a 21-day cycle.  She completed cycle 4 of treatment on January 14, 2021.  Subsequent bone marrow biopsy on January 24, 2021 revealed complete remission with no evidence of myeloma.  Patient underwent autologous stem cell transplant at [facility] on February 24, 2021.  She was readmitted to the hospital on March 04, 2021 with neutropenic fever, diarrhea, and sepsis-like symptoms.  Infectious disease work-up remains negative.   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  PLAN:      1.  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  She received  consolidation with cycle 5 and 6 using daratumumab on day 1, Velcade on day 1, 4, 8, and 11, Revlimid 10 mg  on days 1 through 14 with 7 days off, and weekly dexamethasone on a 21-day cycle.  She is now on maintenance daratumumab on day 1 and Revlimid 10 mg on days 1 through 21 on a 28-day cycle for cycles 7 through 32.  Repeat bone marrow biopsy on October 03, 2021 revealed no evidence of a myeloid neoplasm.  Proceed with cycle self of daratumumab and Revlimid tomorrow.  Patient takes all of her premedications at home prior to clinic, therefore these have been removed from the treatment plan.  Return to clinic in 4 weeks for further evaluation and consideration of cycle 13. 2.  Pain: Improved.  She has completed XRT.  She had kyphoplasty x2.  Continue tramadol.  Patient has now discontinued Percocet.  Repeat MRI results with essentially no change.  Appreciate orthopedics input who recommended conservative management.   3.  Hypocalcemia: Resolved.  Continue Zometa as ordered. 4.  Renal insufficiency: Chronic and unchanged.  Patient's creatinine is 1.46 today.   5.  Anemia: Hemoglobin improved to 11.8. 6.  Thrombocytopenia: Platelets have improved to 109.  Proceed with treatment as above.  If her platelets continue to decrease, may consider dose reducing Revlimid. 7.  Leukopenia: Chronic and unchanged. 8.  Vaccinations: Continue revaccination schedule as per [facility]. 9.  Transaminitis: Resolved.  Patient expressed understanding and was in agreement with this plan. She also understands that She can call clinic at any time with any questions, concerns, or complaints.      Cancer Staging  Lambda light chain myeloma ([place]) Staging form: Plasma Cell Myeloma and Plasma Cell Disorders, AJCC 8th Edition - Clinical stage from 09/16/2020: RISS Stage I (Beta-2-microglobulin (mg/L): 2.5, Albumin (g/dL): 4.9, ISS: Stage I, High-risk cytogenetics: Absent, LDH: Normal) - Signed by Lloyd Huger, MD on  09/16/2020 Beta 2 microglobulin range (mg/L): Less than 3.5 Albumin range (g/dL): Greater than or equal to 3.5 Cytogenetics: 1p deletion  Lloyd Huger, MD   01/23/2022 2:36 PM

## 2022-01-23 NOTE — Progress Notes (Signed)
Fatigue and back pain. Encouraged her to take her percocet for pain as Tramadol not really helping.

## 2022-01-23 NOTE — Patient Instructions (Signed)

## 2022-01-24 ENCOUNTER — Inpatient Hospital Stay: Payer: Medicare PPO

## 2022-01-24 ENCOUNTER — Ambulatory Visit: Payer: Medicare PPO | Admitting: Dermatology

## 2022-01-24 VITALS — BP 139/56 | HR 87 | Temp 96.2°F | Resp 16 | Wt 131.6 lb

## 2022-01-24 DIAGNOSIS — Z5112 Encounter for antineoplastic immunotherapy: Secondary | ICD-10-CM | POA: Diagnosis not present

## 2022-01-24 DIAGNOSIS — C9000 Multiple myeloma not having achieved remission: Secondary | ICD-10-CM

## 2022-01-24 LAB — HEMOGLOBIN A1C
Hgb A1c MFr Bld: 6.7 % — ABNORMAL HIGH (ref 4.8–5.6)
Mean Plasma Glucose: 146 mg/dL

## 2022-01-24 MED ORDER — DARATUMUMAB-HYALURONIDASE-FIHJ 1800-30000 MG-UT/15ML ~~LOC~~ SOLN
1800.0000 mg | Freq: Once | SUBCUTANEOUS | Status: AC
  Administered 2022-01-24: 1800 mg via SUBCUTANEOUS
  Filled 2022-01-24: qty 15

## 2022-01-24 MED ORDER — ACETAMINOPHEN 325 MG PO TABS: 650.0000 mg | ORAL_TABLET | Freq: Once | ORAL | Status: DC

## 2022-01-24 MED ORDER — DEXAMETHASONE 4 MG PO TABS: 20.0000 mg | ORAL_TABLET | Freq: Once | ORAL | Status: DC

## 2022-01-24 MED ORDER — DIPHENHYDRAMINE HCL 25 MG PO CAPS: 25.0000 mg | ORAL_CAPSULE | Freq: Once | ORAL | Status: DC

## 2022-01-24 NOTE — Progress Notes (Signed)
Pt states she took Tylenol '650mg'$ , Benadryl '25mg'$  and Decadron '20mg'$  around 630 this morning. Per Dr Grayland Ormond no post-injection monitoring needed.

## 2022-01-24 NOTE — Patient Instructions (Signed)
[facility] [place]  Discharge Instructions: Thank you for choosing [facility] to provide your oncology and hematology care.  If you have a lab appointment with the Cancer Center, please go directly to the Cancer Center and check in at the registration area.  Wear comfortable clothing and clothing appropriate for easy access to any Portacath or PICC line.   We strive to give you quality time with your provider. You may need to reschedule your appointment if you arrive late (15 or more minutes).  Arriving late affects you and other patients whose appointments are after yours.  Also, if you miss three or more appointments without notifying the office, you may be dismissed from the clinic at the provider's discretion.      For prescription refill requests, have your pharmacy contact our office and allow 72 hours for refills to be completed.    Today you received the following chemotherapy and/or immunotherapy agents Darzalex      To help prevent nausea and vomiting after your treatment, we encourage you to take your nausea medication as directed.  BELOW ARE SYMPTOMS THAT SHOULD BE REPORTED IMMEDIATELY: *FEVER GREATER THAN 100.4 F (38 C) OR HIGHER *CHILLS OR SWEATING *NAUSEA AND VOMITING THAT IS NOT CONTROLLED WITH YOUR NAUSEA MEDICATION *UNUSUAL SHORTNESS OF BREATH *UNUSUAL BRUISING OR BLEEDING *URINARY PROBLEMS (pain or burning when urinating, or frequent urination) *BOWEL PROBLEMS (unusual diarrhea, constipation, pain near the anus) TENDERNESS IN MOUTH AND THROAT WITH OR WITHOUT PRESENCE OF ULCERS (sore throat, sores in mouth, or a toothache) UNUSUAL RASH, SWELLING OR PAIN  UNUSUAL VAGINAL DISCHARGE OR ITCHING   Items with * indicate a potential emergency and should be followed up as soon as possible or go to the Emergency Department if any problems should occur.  Please show the CHEMOTHERAPY ALERT CARD or IMMUNOTHERAPY ALERT CARD at check-in to  the Emergency Department and triage nurse.  Should you have questions after your visit or need to cancel or reschedule your appointment, please contact [facility] [place]  [phone] and follow the prompts.  Office hours are 8:00 a.m. to 4:30 p.m. Monday - Friday. Please note that voicemails left after 4:00 p.m. may not be returned until the following business day.  We are closed weekends and major holidays. You have access to a nurse at all times for urgent questions. Please call the main number to the clinic [phone] and follow the prompts.  For any non-urgent questions, you may also contact your provider using MyChart. We now offer e-Visits for anyone 18 and older to request care online for non-urgent symptoms. For details visit [email or web address].   Also download the MyChart app! Go to the app store, search "MyChart", open the app, select [place], and log in with your MyChart username and password.  Masks are optional in the cancer centers. If you would like for your care team to wear a mask while they are taking care of you, please let them know. For doctor visits, patients may have with them one support person who is at least 65 years old. At this time, visitors are not allowed in the infusion area.   

## 2022-01-26 ENCOUNTER — Other Ambulatory Visit: Payer: Self-pay | Admitting: Oncology

## 2022-01-30 NOTE — Progress Notes (Addendum)
Patient was already taking dexamethasone at home prior to daratumumab injection. After discussion with provider, order for dexamethasone to be given in clinic have been removed. Offset time of daratumumab has been reduced to 30 minutes to reflect this change.   Because patient was already taking dexamethasone at home for over a year, no calendar was provided.   Laray Anger, PharmD PGY-2 Pharmacy Resident Hematology/Oncology [phone]  01/30/2022 1:03 PM

## 2022-02-02 ENCOUNTER — Encounter: Payer: Self-pay | Admitting: Oncology

## 2022-02-02 ENCOUNTER — Other Ambulatory Visit: Payer: Self-pay | Admitting: Oncology

## 2022-02-02 DIAGNOSIS — C9000 Multiple myeloma not having achieved remission: Secondary | ICD-10-CM

## 2022-02-03 ENCOUNTER — Other Ambulatory Visit: Payer: Self-pay

## 2022-02-03 DIAGNOSIS — C9000 Multiple myeloma not having achieved remission: Secondary | ICD-10-CM

## 2022-02-09 ENCOUNTER — Other Ambulatory Visit: Payer: Self-pay | Admitting: Internal Medicine

## 2022-02-09 ENCOUNTER — Ambulatory Visit
Admission: RE | Admit: 2022-02-09 | Discharge: 2022-02-09 | Disposition: A | Discharge status: Home / Self Care | Payer: Medicare PPO | Attending: Internal Medicine | Admitting: Internal Medicine

## 2022-02-09 ENCOUNTER — Ambulatory Visit
Admission: RE | Admit: 2022-02-09 | Discharge: 2022-02-09 | Disposition: A | Discharge status: Home / Self Care | Payer: Medicare PPO | Source: Ambulatory Visit | Attending: Internal Medicine | Admitting: Internal Medicine

## 2022-02-09 DIAGNOSIS — R059 Cough, unspecified: Secondary | ICD-10-CM

## 2022-02-14 ENCOUNTER — Ambulatory Visit: Payer: Medicare PPO

## 2022-02-14 ENCOUNTER — Other Ambulatory Visit: Payer: Medicare PPO

## 2022-02-14 ENCOUNTER — Ambulatory Visit: Payer: Medicare PPO | Admitting: Oncology

## 2022-02-14 ENCOUNTER — Ambulatory Visit: Payer: Medicare PPO | Admitting: Nurse Practitioner

## 2022-02-16 ENCOUNTER — Inpatient Hospital Stay: Payer: Medicare PPO | Attending: Oncology

## 2022-02-16 ENCOUNTER — Ambulatory Visit
Admission: RE | Admit: 2022-02-16 | Discharge: 2022-02-16 | Disposition: A | Discharge status: Home / Self Care | Payer: Medicare PPO | Source: Ambulatory Visit | Attending: Oncology | Admitting: Oncology

## 2022-02-16 DIAGNOSIS — Z79899 Other long term (current) drug therapy: Secondary | ICD-10-CM | POA: Insufficient documentation

## 2022-02-16 DIAGNOSIS — D72819 Decreased white blood cell count, unspecified: Secondary | ICD-10-CM | POA: Insufficient documentation

## 2022-02-16 DIAGNOSIS — C9000 Multiple myeloma not having achieved remission: Secondary | ICD-10-CM | POA: Insufficient documentation

## 2022-02-16 DIAGNOSIS — R911 Solitary pulmonary nodule: Secondary | ICD-10-CM | POA: Insufficient documentation

## 2022-02-16 DIAGNOSIS — Z5112 Encounter for antineoplastic immunotherapy: Secondary | ICD-10-CM | POA: Insufficient documentation

## 2022-02-16 DIAGNOSIS — D696 Thrombocytopenia, unspecified: Secondary | ICD-10-CM | POA: Insufficient documentation

## 2022-02-16 DIAGNOSIS — Z9484 Stem cells transplant status: Secondary | ICD-10-CM | POA: Insufficient documentation

## 2022-02-16 DIAGNOSIS — N289 Disorder of kidney and ureter, unspecified: Secondary | ICD-10-CM | POA: Insufficient documentation

## 2022-02-16 DIAGNOSIS — C9001 Multiple myeloma in remission: Secondary | ICD-10-CM | POA: Insufficient documentation

## 2022-02-16 DIAGNOSIS — D649 Anemia, unspecified: Secondary | ICD-10-CM | POA: Insufficient documentation

## 2022-02-16 DIAGNOSIS — Z923 Personal history of irradiation: Secondary | ICD-10-CM | POA: Insufficient documentation

## 2022-02-16 LAB — GLUCOSE, CAPILLARY: Glucose-Capillary: 130 mg/dL — ABNORMAL HIGH (ref 70–99)

## 2022-02-16 MED ORDER — HEPARIN SOD (PORK) LOCK FLUSH 100 UNIT/ML IV SOLN
INTRAVENOUS | Status: AC
  Filled 2022-02-16: qty 5

## 2022-02-16 MED ORDER — FLUDEOXYGLUCOSE F - 18 (FDG) INJECTION
7.1900 | Freq: Once | INTRAVENOUS | Status: AC | PRN
  Administered 2022-02-16: 7.19 via INTRAVENOUS

## 2022-02-21 ENCOUNTER — Inpatient Hospital Stay: Payer: Medicare PPO

## 2022-02-21 ENCOUNTER — Other Ambulatory Visit: Payer: Self-pay | Admitting: Oncology

## 2022-02-21 ENCOUNTER — Inpatient Hospital Stay: Payer: Medicare PPO | Admitting: Oncology

## 2022-02-21 DIAGNOSIS — C9000 Multiple myeloma not having achieved remission: Secondary | ICD-10-CM

## 2022-02-21 DIAGNOSIS — D72819 Decreased white blood cell count, unspecified: Secondary | ICD-10-CM | POA: Diagnosis not present

## 2022-02-21 DIAGNOSIS — D649 Anemia, unspecified: Secondary | ICD-10-CM | POA: Diagnosis not present

## 2022-02-21 DIAGNOSIS — Z9484 Stem cells transplant status: Secondary | ICD-10-CM | POA: Diagnosis not present

## 2022-02-21 DIAGNOSIS — Z5112 Encounter for antineoplastic immunotherapy: Secondary | ICD-10-CM | POA: Diagnosis present

## 2022-02-21 DIAGNOSIS — R911 Solitary pulmonary nodule: Secondary | ICD-10-CM | POA: Diagnosis not present

## 2022-02-21 DIAGNOSIS — Z923 Personal history of irradiation: Secondary | ICD-10-CM | POA: Diagnosis not present

## 2022-02-21 DIAGNOSIS — C9001 Multiple myeloma in remission: Secondary | ICD-10-CM | POA: Diagnosis present

## 2022-02-21 DIAGNOSIS — D696 Thrombocytopenia, unspecified: Secondary | ICD-10-CM | POA: Diagnosis not present

## 2022-02-21 DIAGNOSIS — N289 Disorder of kidney and ureter, unspecified: Secondary | ICD-10-CM | POA: Diagnosis not present

## 2022-02-21 DIAGNOSIS — Z79899 Other long term (current) drug therapy: Secondary | ICD-10-CM | POA: Diagnosis not present

## 2022-02-21 DIAGNOSIS — Z95828 Presence of other vascular implants and grafts: Secondary | ICD-10-CM

## 2022-02-21 LAB — CBC WITH DIFFERENTIAL/PLATELET
Abs Immature Granulocytes: 0.01 10*3/uL (ref 0.00–0.07)
Basophils Absolute: 0 10*3/uL (ref 0.0–0.1)
Basophils Relative: 1 %
Eosinophils Absolute: 0.2 10*3/uL (ref 0.0–0.5)
Eosinophils Relative: 6 %
HCT: 29.6 % — ABNORMAL LOW (ref 36.0–46.0)
Hemoglobin: 9.6 g/dL — ABNORMAL LOW (ref 12.0–15.0)
Immature Granulocytes: 0 %
Lymphocytes Relative: 13 %
Lymphs Abs: 0.5 10*3/uL — ABNORMAL LOW (ref 0.7–4.0)
MCH: 32.3 pg (ref 26.0–34.0)
MCHC: 32.4 g/dL (ref 30.0–36.0)
MCV: 99.7 fL (ref 80.0–100.0)
Monocytes Absolute: 0.2 10*3/uL (ref 0.1–1.0)
Monocytes Relative: 5 %
Neutro Abs: 2.6 10*3/uL (ref 1.7–7.7)
Neutrophils Relative %: 75 %
Platelets: 74 10*3/uL — ABNORMAL LOW (ref 150–400)
RBC: 2.97 MIL/uL — ABNORMAL LOW (ref 3.87–5.11)
RDW: 14.9 % (ref 11.5–15.5)
WBC: 3.5 10*3/uL — ABNORMAL LOW (ref 4.0–10.5)
nRBC: 0 % (ref 0.0–0.2)

## 2022-02-21 LAB — COMPREHENSIVE METABOLIC PANEL
ALT: 16 U/L (ref 0–44)
AST: 22 U/L (ref 15–41)
Albumin: 3.4 g/dL — ABNORMAL LOW (ref 3.5–5.0)
Alkaline Phosphatase: 81 U/L (ref 38–126)
Anion gap: 6 (ref 5–15)
BUN: 18 mg/dL (ref 8–23)
CO2: 21 mmol/L — ABNORMAL LOW (ref 22–32)
Calcium: 8.3 mg/dL — ABNORMAL LOW (ref 8.9–10.3)
Chloride: 112 mmol/L — ABNORMAL HIGH (ref 98–111)
Creatinine, Ser: 1.39 mg/dL — ABNORMAL HIGH (ref 0.44–1.00)
GFR, Estimated: 42 mL/min — ABNORMAL LOW (ref >60)
Glucose, Bld: 236 mg/dL — ABNORMAL HIGH (ref 70–99)
Potassium: 3.8 mmol/L (ref 3.5–5.1)
Sodium: 139 mmol/L (ref 135–145)
Total Bilirubin: 0.3 mg/dL (ref 0.3–1.2)
Total Protein: 5.7 g/dL — ABNORMAL LOW (ref 6.5–8.1)

## 2022-02-21 MED ORDER — ACETAMINOPHEN 325 MG PO TABS: 650.0000 mg | ORAL_TABLET | Freq: Once | ORAL | Status: DC

## 2022-02-21 MED ORDER — SODIUM CHLORIDE 0.9 % IV SOLN
INTRAVENOUS | Status: DC
  Filled 2022-02-21: qty 250

## 2022-02-21 MED ORDER — DARATUMUMAB-HYALURONIDASE-FIHJ 1800-30000 MG-UT/15ML ~~LOC~~ SOLN
1800.0000 mg | Freq: Once | SUBCUTANEOUS | Status: AC
  Administered 2022-02-21: 1800 mg via SUBCUTANEOUS
  Filled 2022-02-21: qty 15

## 2022-02-21 MED ORDER — DIPHENHYDRAMINE HCL 25 MG PO CAPS: 25.0000 mg | ORAL_CAPSULE | Freq: Once | ORAL | Status: DC

## 2022-02-21 MED ORDER — ZOLEDRONIC ACID 4 MG/5ML IV CONC
3.3000 mg | Freq: Once | INTRAVENOUS | Status: AC
  Administered 2022-02-21: 3.3 mg via INTRAVENOUS
  Filled 2022-02-21: qty 4.13

## 2022-02-21 MED ORDER — HEPARIN SOD (PORK) LOCK FLUSH 100 UNIT/ML IV SOLN
500.0000 [IU] | Freq: Once | INTRAVENOUS | Status: AC
  Administered 2022-02-21: 500 [IU] via INTRAVENOUS
  Filled 2022-02-21: qty 5

## 2022-02-21 NOTE — Patient Instructions (Signed)
[facility] AT [place]  Discharge Instructions: Thank you for choosing [place] to provide your oncology and hematology care.  If you have a lab appointment with the [place], please go directly to the [place] and check in at the registration area.  Wear comfortable clothing and clothing appropriate for easy access to any Portacath or PICC line.   We strive to give you quality time with your provider. You may need to reschedule your appointment if you arrive late (15 or more minutes).  Arriving late affects you and other patients whose appointments are after yours.  Also, if you miss three or more appointments without notifying the office, you may be dismissed from the clinic at the provider's discretion.      For prescription refill requests, have your pharmacy contact our office and allow 72 hours for refills to be completed.    Today you received the following chemotherapy and/or immunotherapy agents DARZALEX and ZOMETA      To help prevent nausea and vomiting after your treatment, we encourage you to take your nausea medication as directed.  BELOW ARE SYMPTOMS THAT SHOULD BE REPORTED IMMEDIATELY: *FEVER GREATER THAN 100.4 F (38 C) OR HIGHER *CHILLS OR SWEATING *NAUSEA AND VOMITING THAT IS NOT CONTROLLED WITH YOUR NAUSEA MEDICATION *UNUSUAL SHORTNESS OF BREATH *UNUSUAL BRUISING OR BLEEDING *URINARY PROBLEMS (pain or burning when urinating, or frequent urination) *BOWEL PROBLEMS (unusual diarrhea, constipation, pain near the anus) TENDERNESS IN MOUTH AND THROAT WITH OR WITHOUT PRESENCE OF ULCERS (sore throat, sores in mouth, or a toothache) UNUSUAL RASH, SWELLING OR PAIN  UNUSUAL VAGINAL DISCHARGE OR ITCHING   Items with * indicate a potential emergency and should be followed up as soon as possible or go to the Emergency Department if any problems should occur.  Please show the CHEMOTHERAPY ALERT CARD or IMMUNOTHERAPY ALERT CARD at  check-in to the Emergency Department and triage nurse.  Should you have questions after your visit or need to cancel or reschedule your appointment, please contact [facility] CANCER [place] AT [place]  [phone] and follow the prompts.  Office hours are 8:00 a.m. to 4:30 p.m. Monday - Friday. Please note that voicemails left after 4:00 p.m. may not be returned until the following business day.  We are closed weekends and major holidays. You have access to a nurse at all times for urgent questions. Please call the main number to the clinic [phone] and follow the prompts.  For any non-urgent questions, you may also contact your provider using MyChart. We now offer e-Visits for anyone 74 and older to request care online for non-urgent symptoms. For details visit [email or web address].   Also download the MyChart app! Go to the app store, search "MyChart", open the app, select [place], and log in with your MyChart username and password.  Masks are optional in the cancer centers. If you would like for your care team to wear a mask while they are taking care of you, please let them know. For doctor visits, patients may have with them one support person who is at least 65 years old. At this time, visitors are not allowed in the infusion area.

## 2022-02-21 NOTE — Progress Notes (Signed)
Pt cough and URI are much better. Has fatigue. States having no appetite. Constant lower back pain 3/10.

## 2022-02-21 NOTE — Progress Notes (Addendum)
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  INTERVAL HISTORY: Patient returns to clinic today for further evaluation and consideration of cycle 13 of maintenance daratumumab, Revlimid, and Zometa.  She currently feels well and is asymptomatic.  She continues to have back pain, but this is well-controlled on her current dose of tramadol.  Her peripheral neuropathy is essentially unchanged.  She has no other neurologic complaints.  She denies any recent fevers or illnesses.  She has no chest pain, shortness of breath, cough, or hemoptysis.  She denies any nausea, vomiting, constipation, or diarrhea.  She has no urinary complaints.  Patient offers no further specific complaints today.  REVIEW OF SYSTEMS:   Review of Systems  Constitutional: Negative.  Negative for fever, malaise/fatigue and weight loss.  Respiratory: Negative.  Negative for cough, hemoptysis and shortness of breath.   Cardiovascular:  Negative for chest pain and leg swelling.  Gastrointestinal: Negative.  Negative for abdominal pain, diarrhea and nausea.  Genitourinary: Negative.  Negative for dysuria.  Musculoskeletal:  Positive for back pain. Negative for joint pain and neck pain.  Skin: Negative.  Negative for rash.  Neurological:  Positive for sensory change. Negative for dizziness, tremors, focal weakness, weakness and headaches.  Psychiatric/Behavioral: Negative.  The patient is not nervous/anxious.     As per HPI. Otherwise, a complete review of systems is negative.  PAST MEDICAL HISTORY: Past Medical History:  Diagnosis Date   Anxiety    Diabetes mellitus without complication ([place]) 1552   GERD  (gastroesophageal reflux disease)    Hyperlipidemia    Myeloma ([place])    Myeloma ([place])    Personal history of colonic polyps    Squamous cell carcinoma of skin 11/25/2013   Left chest. WD SCC with superficial infiltration.   Tachycardia     PAST SURGICAL HISTORY: Past Surgical History:  Procedure Laterality Date   AUGMENTATION MAMMAPLASTY Bilateral 0802   silicone and replacement in 2001   [place], [place], 1998,2003, 2008, 2013   COLONOSCOPY WITH PROPOFOL N/A 08/16/2016   Procedure: COLONOSCOPY WITH PROPOFOL;  Surgeon: Robert Bellow, MD;  Location: [facility] ENDOSCOPY;  Service: Endoscopy;  Laterality: N/A;   COLONOSCOPY WITH PROPOFOL N/A 10/05/2021   Procedure: COLONOSCOPY WITH PROPOFOL;  Surgeon: Robert Bellow, MD;  Location: [facility] ENDOSCOPY;  Service: Endoscopy;  Laterality: N/A;  HAS A PORT, NEEDS AMPICILLIN PER OFFICE   DILATION AND CURETTAGE OF UTERUS     ENDOMETRIAL ABLATION  12/1991   ganglion cyst removal      IR IMAGING GUIDED PORT INSERTION  10/27/2020   IR IMAGING GUIDED PORT INSERTION  04/04/2021   KYPHOPLASTY N/A 10/28/2020   Procedure: T12 and L3 KYPHOPLASTY;  Surgeon: Hessie Knows, MD;  Location: [facility] ORS;  Service: Orthopedics;  Laterality: N/A;   TONSILLECTOMY     WRIST SURGERY Right 05/1995    FAMILY HISTORY: Family History  Problem Relation Age of Onset   Colon polyps Sister    Colon cancer Father 38   Diabetes Mother    Breast cancer Neg Hx     ADVANCED DIRECTIVES (Y/N):  N  HEALTH MAINTENANCE: Social History  Tobacco Use   Smoking status: Former    Packs/day: 1.00    Years: 4.00    Total pack years: 4.00    Types: Cigarettes    Quit date: 71    Years since quitting: 42.0   Smokeless tobacco: Never  Vaping Use   Vaping Use: Never used  Substance Use Topics   Alcohol use: Yes    Alcohol/week: 1.0 - 2.0 standard drink of alcohol    Types: 1 - 2 Glasses of wine per week     Comment: occassionally   Drug use: No     Colonoscopy:  PAP:  Bone density:  Lipid panel:  No Known Allergies  Current Outpatient Medications  Medication Sig Dispense Refill   acetaminophen (TYLENOL) 325 MG tablet Take 650 mg by mouth 2 (two) times a week. Premed for velcade. Taking Tuesdays and Friday     acyclovir (ZOVIRAX) 400 MG tablet TAKE 1 TABLET BY MOUTH TWICE DAILY 60 tablet 5   ALPRAZolam (XANAX) 0.25 MG tablet TAKE 1 TABLET BY MOUTH AT BEDTIME AS NEEDED FOR SLEEP 30 tablet 2   ASPIRIN 81 PO Take 81 mg by mouth daily.     atorvastatin (LIPITOR) 20 MG tablet Take 20 mg by mouth daily.     busPIRone (BUSPAR) 30 MG tablet Take 30 mg by mouth 2 (two) times daily.     CALCIUM-VITAMIN D PO Take by mouth 2 (two) times daily.     cyclobenzaprine (FLEXERIL) 10 MG tablet TAKE 1 TABLET BY MOUTH AT BEDTIME 30 tablet 2   dexamethasone (DECADRON) 4 MG tablet TAKE 5 TABLETS BY MOUTH ONCE A WEEK 20 tablet 3   diphenhydrAMINE (BENADRYL) 25 mg capsule Take 25 mg by mouth 2 (two) times a week. Pre med for velcade. Only Tuesdays and Fridays     gabapentin (NEURONTIN) 300 MG capsule TAKE 1 CAPSULE BY MOUTH TWICE DAILY 60 capsule 2   ivabradine (CORLANOR) 5 MG TABS tablet Take 5 mg by mouth 2 (two) times daily with a meal.     lenalidomide (REVLIMID) 10 MG capsule TAKE 1 CAPSULE BY MOUTH 1 TIME A DAY FOR 21 DAYS ON THEN 7 DAYS OFF 21 capsule 0   lidocaine-prilocaine (EMLA) cream APPLY 1 APPLICATION TOPICALLY AS NEEDED 30 g 2   liraglutide (VICTOZA) 18 MG/3ML SOPN Inject 1.2 mg into the skin daily.     Magnesium 400 MG TABS Take 1 tablet by mouth daily.     meloxicam (MOBIC) 15 MG tablet Take 15 mg by mouth daily.     methocarbamol (ROBAXIN) 500 MG tablet Take 1 tablet (500 mg total) by mouth every 6 (six) hours as needed for muscle spasms. 40 tablet 1   Multiple Vitamin (MULTI-VITAMIN) tablet Take 1 tablet by mouth daily.     omeprazole (PRILOSEC) 40 MG capsule Take 40 mg by mouth daily.      ondansetron (ZOFRAN) 8 MG tablet Take 1 tablet (8 mg total) by mouth 2 (two) times daily as needed (Nausea or vomiting). 60 tablet 2   oxyCODONE-acetaminophen (PERCOCET/ROXICET) 5-325 MG tablet TAKE 1-2 TABLETS BY MOUTH EVERY 4 HOURS AS NEEDED FOR SEVERE PAIN 90 tablet 0   diphenoxylate-atropine (LOMOTIL) 2.5-0.025 MG tablet Take by mouth as needed. (Patient not taking: Reported on 08/02/2021)     prochlorperazine (COMPAZINE) 10 MG tablet TAKE 1 TABLET BY MOUTH EVERY 6 HOURS AS NEEDED FOR NAUSEA OR VOMITING 60 tablet 2   traMADol (ULTRAM) 50 MG tablet Take 1 tablet (50 mg total)  by mouth every 6 (six) hours as needed. Take 1 to 2 tablets (100 mg total) as needed for pain. 120 tablet 2   No current facility-administered medications for this visit.   Facility-Administered Medications Ordered in Other Visits  Medication Dose Route Frequency Provider Last Rate Last Admin   0.9 %  sodium chloride infusion   Intravenous Continuous Lloyd Huger, MD   Stopped at 04/07/21 1142   0.9 %  sodium chloride infusion   Intravenous Continuous Lloyd Huger, MD   Stopped at 02/21/22 1023   acetaminophen (TYLENOL) tablet 650 mg  650 mg Oral Once Lloyd Huger, MD       diphenhydrAMINE (BENADRYL) capsule 25 mg  25 mg Oral Once Lloyd Huger, MD       sodium chloride flush (NS) 0.9 % injection 10 mL  10 mL Intravenous PRN Lloyd Huger, MD   10 mL at 11/23/20 0932    OBJECTIVE: Vitals:   02/21/22 0849  BP: (!) 125/52  Pulse: 81  Resp: 18  Temp: 97.8 F (36.6 C)  SpO2: 100%     Body mass index is 24.62 kg/m.    ECOG FS:0 - Asymptomatic  General: Well-developed, well-nourished, no acute distress. Eyes: Pink conjunctiva, anicteric sclera. HEENT: Normocephalic, moist mucous membranes. Lungs: No audible wheezing or coughing. Heart: Regular rate and rhythm. Abdomen: Soft, nontender, no obvious distention. Musculoskeletal: No edema, cyanosis, or clubbing. Neuro: Alert, answering  all questions appropriately. Cranial nerves grossly intact. Skin: No rashes or petechiae noted. Psych: Normal affect.  LAB RESULTS:  Lab Results  Component Value Date   NA 139 02/21/2022   K 3.8 02/21/2022   CL 112 (H) 02/21/2022   CO2 21 (L) 02/21/2022   GLUCOSE 236 (H) 02/21/2022   BUN 18 02/21/2022   CREATININE 1.39 (H) 02/21/2022   CALCIUM 8.3 (L) 02/21/2022   PROT 5.7 (L) 02/21/2022   ALBUMIN 3.4 (L) 02/21/2022   AST 22 02/21/2022   ALT 16 02/21/2022   ALKPHOS 81 02/21/2022   BILITOT 0.3 02/21/2022   GFRNONAA 42 (L) 02/21/2022    Lab Results  Component Value Date   WBC 3.5 (L) 02/21/2022   NEUTROABS 2.6 02/21/2022   HGB 9.6 (L) 02/21/2022   HCT 29.6 (L) 02/21/2022   MCV 99.7 02/21/2022   PLT 74 (L) 02/21/2022     STUDIES: NM PET Image Restage (PS) Whole Body  Result Date: 02/21/2022 CLINICAL DATA:  Subsequent treatment strategy for multiple myeloma. EXAM: NUCLEAR MEDICINE PET WHOLE BODY TECHNIQUE: 7.2 mCi F-18 FDG was injected intravenously. Full-ring PET imaging was performed from the head to foot after the radiotracer. CT data was obtained and used for attenuation correction and anatomic localization. Fasting blood glucose: 130 mg/dl COMPARISON:  MRI thoracic spine, CT chest 01/01/2021 CT scan 09/06/2020 FINDINGS: Mediastinal blood pool activity: SUV max 1.3 HEAD/NECK: No hypermetabolic activity in the scalp. No hypermetabolic cervical lymph nodes. Intense metabolic activity within the oral cavity favored physiologic tongue muscular activity Incidental CT findings: none CHEST: New hypermetabolic LEFT lower lobe pulmonary nodule measures 9 mm (115/2) with SUV max equal 5.7. Incidental CT findings: none ABDOMEN/PELVIS: No abnormal hypermetabolic activity within the liver, pancreas, adrenal glands, or spleen. No hypermetabolic lymph nodes in the abdomen or pelvis. Incidental CT findings: none SKELETON: Multiple new foci hypermetabolic activity within the thoracic and  lumbar spine. This new metabolic activity associated with endplate sclerosis, compression deformities or augmentation. The endplate sclerosis and augmentation are new from comparison PET  scan. There is interval collapse of the T5 vertebral body with intense metabolic activity (SUV max equal 5.2). Cervical spine activity at C5-C6 localizing LEFT facets with SUV max equal 6.7. Facet hypertrophy noted. There multiple radiotracer avid rib lesions bilaterally (SUV max equal 2.3 on image 122 on the LEFT for example). There is a sclerotic lesion associated with this activity. Similar findings on the LEFT and RIGHT on image 119. Asymmetric activity in the posterior LEFT iliac bone with SUV max equal 3.9 (image 195. Interval resolution of the mass within the RIGHT iliac wing. Asymmetric activity may relate to radiation to the RIGHT hemipelvis. Incidental CT findings: none EXTREMITIES: No abnormal hypermetabolic activity in the lower extremities. Incidental CT findings: none IMPRESSION: 1. Hypermetabolic new LEFT lower lobe pulmonary nodule differential including malignancy versus pulmonary infection. Recommend TISSUE SAMPLING versus short-term follow-up. 2. Multiple new metabolic foci within the spine; however, the new activity associated is with new compression fractures and endplate sclerosis. Favor sequelae of multiple chronic compression fractures. 3. Multiple foci of rib uptake which have associated sclerotic lesions. Differential includes metastatic disease versus multiple healed fractures. 4. Interval resolution of the mass in the RIGHT iliac wing with no metabolic activity. 5. No plasmacytoma. Electronically Signed   By: Suzy Bouchard M.D.   On: 02/21/2022 14:16   DG Chest 2 View  Result Date: 02/09/2022 CLINICAL DATA:  Cough, wheezing EXAM: CHEST - 2 VIEW COMPARISON:  12/31/2020 FINDINGS: Cardiac size is within normal limits. There are no signs of pulmonary edema or focal pulmonary consolidation. There is no  pleural effusion or pneumothorax. There is 10 mm nodular density in left lower lung field deformities are seen in multiple bilateral ribs suggesting healed or healing fractures. There is evidence of kyphoplasty in thoracic and lumbar vertebral bodies. There is decrease in height of multiple thoracic vertebral bodies. Tip of right IJ chest port is seen in superior vena cava. IMPRESSION: There are no signs of pulmonary edema or focal pulmonary consolidation. There is a new 10 mm nodular density in left lower lung field suggesting possible neoplastic process in the lung fields. Follow-up CT may be considered. Deformities are seen in multiple bilateral ribs which may suggest traumatic fractures or pathological fractures. There is decrease in height of multiple thoracic vertebral bodies suggesting recent or old compression fractures. Electronically Signed   By: Elmer Picker M.D.   On: 02/09/2022 13:05    ONCOLOGY HISTORY:  Diagnosis confirmed from bone biopsy on August 17, 2020.  Initially her lambda free light chains were elevated at 432.1, but now are within normal limits at 12.1.  Immunoglobulins and SPEP are normal.  Bone marrow biopsy completed on August 26, 2020 revealed 25% plasma cells along with the deletion of 1p which is considered poor prognosis.  PET scan results from September 06, 2020 reviewed independently with 3 hypermetabolic lesions in right C5-6 facets, right iliac crest lesion, and L3 vertebral lesion.  After lengthy discussion with the patient, she agreed to pursue chemotherapy with daratumumab, Velcade, Revlimid, and dexamethasone followed by autologous bone marrow transplant.  Patient received weekly daratumumab for 4 cycles along with Velcade on days 1, 4, 8, and 11.  She also received Revlimid on days 1 through 14 of a 21-day cycle.  She completed cycle 4 of treatment on January 14, 2021.  Subsequent bone marrow biopsy on January 24, 2021 revealed complete remission with no evidence of  myeloma.  Patient underwent autologous stem cell transplant at [facility] on February 24, 2021.  She was readmitted to the hospital on March 04, 2021 with neutropenic fever, diarrhea, and sepsis-like symptoms.  Infectious disease work-up remains negative.   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  PLAN:      1.  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  She received consolidation with cycle 5 and 6 using daratumumab on day 1, Velcade on day 1, 4, 8, and 11, Revlimid 10 mg on days 1 through 14 with 7 days off, and weekly dexamethasone on a 21-day cycle.  She is now on maintenance daratumumab on day 1 and Revlimid 10 mg on days 1 through 21 on a 28-day cycle for cycles 7 through 32.  Repeat bone marrow biopsy on October 03, 2021 revealed no evidence of a myeloid neoplasm.  Repeat PET scan on February 16, 2022 reviewed independently and reported as above with new bony hypermetabolism which favors sequelae of chronic compression fractures rather than progression of disease. Can consider repeating in June 2024.  Proceed with cycle 13 of daratumumab and Revlimid today.  Patient takes all of her premedications at home prior to clinic, therefore these have been removed from the treatment plan.  Patient will also receive Zometa today.  Return to clinic in 4 weeks for further evaluation and consideration of cycle 14.   2.  Pain: Improved.  She has completed XRT.  She had kyphoplasty x2.  Continue tramadol.  Patient has now discontinued Percocet.  Repeat MRI results with essentially no change.  PET scan results as above.  Appreciate orthopedics input who recommended conservative management.   3.  Hypocalcemia: Mild.  Proceed with Zometa as above. 4.  Renal insufficiency: Chronic and unchanged.  Patient's creatinine is 1.39.   5.  Anemia: Hemoglobin has trended down to 9.6.  Can consider a dose reduction of  Revlimid in the future if necessary. 6.  Thrombocytopenia: Platelets have also decreased to 74.  Consider dose reduction of Revlimid as above.   7.  Leukopenia: Chronic and unchanged. 8.  Vaccinations: Continue revaccination schedule as per [facility]. 9.  Transaminitis: Resolved. 10.  Pulmonary nodule: Patient noted to have a 9 mm hypermetabolic pulmonary nodule in her left lower lobe lung.  Possibly infectious, continue to monitor closely.  Patient expressed understanding and was in agreement with this plan. She also understands that She can call clinic at any time with any questions, concerns, or complaints.      Cancer Staging  Lambda light chain myeloma ([place]) Staging form: Plasma Cell Myeloma and Plasma Cell Disorders, AJCC 8th Edition - Clinical stage from 09/16/2020: RISS Stage I (Beta-2-microglobulin (mg/L): 2.5, Albumin (g/dL): 4.9, ISS: Stage I, High-risk cytogenetics: Absent, LDH: Normal) - Signed by Lloyd Huger, MD on 09/16/2020 Beta 2 microglobulin range (mg/L): Less than 3.5 Albumin range (g/dL): Greater than or equal to 3.5 Cytogenetics: 1p deletion  Lloyd Huger, MD   02/21/2022 2:22 PM

## 2022-02-22 LAB — KAPPA/LAMBDA LIGHT CHAINS
Kappa free light chain: 13.7 mg/L (ref 3.3–19.4)
Kappa, lambda light chain ratio: 1.09 (ref 0.26–1.65)
Lambda free light chains: 12.6 mg/L (ref 5.7–26.3)

## 2022-03-06 ENCOUNTER — Other Ambulatory Visit: Payer: Self-pay | Admitting: Oncology

## 2022-03-06 DIAGNOSIS — C9000 Multiple myeloma not having achieved remission: Secondary | ICD-10-CM

## 2022-03-07 ENCOUNTER — Ambulatory Visit: Payer: Medicare PPO | Admitting: Dermatology

## 2022-03-21 ENCOUNTER — Encounter: Payer: Self-pay | Admitting: Oncology

## 2022-03-21 ENCOUNTER — Inpatient Hospital Stay: Payer: Medicare PPO

## 2022-03-21 ENCOUNTER — Inpatient Hospital Stay: Payer: Medicare PPO | Attending: Oncology | Admitting: Oncology

## 2022-03-21 DIAGNOSIS — Z79899 Other long term (current) drug therapy: Secondary | ICD-10-CM | POA: Insufficient documentation

## 2022-03-21 DIAGNOSIS — N289 Disorder of kidney and ureter, unspecified: Secondary | ICD-10-CM | POA: Insufficient documentation

## 2022-03-21 DIAGNOSIS — C9001 Multiple myeloma in remission: Secondary | ICD-10-CM | POA: Diagnosis present

## 2022-03-21 DIAGNOSIS — Z9484 Stem cells transplant status: Secondary | ICD-10-CM | POA: Insufficient documentation

## 2022-03-21 DIAGNOSIS — D72819 Decreased white blood cell count, unspecified: Secondary | ICD-10-CM | POA: Diagnosis not present

## 2022-03-21 DIAGNOSIS — C9000 Multiple myeloma not having achieved remission: Secondary | ICD-10-CM

## 2022-03-21 DIAGNOSIS — Z5112 Encounter for antineoplastic immunotherapy: Secondary | ICD-10-CM | POA: Insufficient documentation

## 2022-03-21 DIAGNOSIS — Z923 Personal history of irradiation: Secondary | ICD-10-CM | POA: Insufficient documentation

## 2022-03-21 DIAGNOSIS — D696 Thrombocytopenia, unspecified: Secondary | ICD-10-CM | POA: Insufficient documentation

## 2022-03-21 LAB — COMPREHENSIVE METABOLIC PANEL
ALT: 18 U/L (ref 0–44)
AST: 27 U/L (ref 15–41)
Albumin: 3.8 g/dL (ref 3.5–5.0)
Alkaline Phosphatase: 82 U/L (ref 38–126)
Anion gap: 11 (ref 5–15)
BUN: 19 mg/dL (ref 8–23)
CO2: 21 mmol/L — ABNORMAL LOW (ref 22–32)
Calcium: 8.9 mg/dL (ref 8.9–10.3)
Chloride: 106 mmol/L (ref 98–111)
Creatinine, Ser: 1.42 mg/dL — ABNORMAL HIGH (ref 0.44–1.00)
GFR, Estimated: 41 mL/min — ABNORMAL LOW (ref >60)
Glucose, Bld: 174 mg/dL — ABNORMAL HIGH (ref 70–99)
Potassium: 3.9 mmol/L (ref 3.5–5.1)
Sodium: 138 mmol/L (ref 135–145)
Total Bilirubin: 0.3 mg/dL (ref 0.3–1.2)
Total Protein: 6.3 g/dL — ABNORMAL LOW (ref 6.5–8.1)

## 2022-03-21 LAB — CBC WITH DIFFERENTIAL/PLATELET
Abs Immature Granulocytes: 0.02 10*3/uL (ref 0.00–0.07)
Basophils Absolute: 0 10*3/uL (ref 0.0–0.1)
Basophils Relative: 1 %
Eosinophils Absolute: 0.1 10*3/uL (ref 0.0–0.5)
Eosinophils Relative: 4 %
HCT: 29.9 % — ABNORMAL LOW (ref 36.0–46.0)
Hemoglobin: 9.9 g/dL — ABNORMAL LOW (ref 12.0–15.0)
Immature Granulocytes: 1 %
Lymphocytes Relative: 12 %
Lymphs Abs: 0.3 10*3/uL — ABNORMAL LOW (ref 0.7–4.0)
MCH: 33 pg (ref 26.0–34.0)
MCHC: 33.1 g/dL (ref 30.0–36.0)
MCV: 99.7 fL (ref 80.0–100.0)
Monocytes Absolute: 0.3 10*3/uL (ref 0.1–1.0)
Monocytes Relative: 10 %
Neutro Abs: 2.1 10*3/uL (ref 1.7–7.7)
Neutrophils Relative %: 72 %
Platelets: 78 10*3/uL — ABNORMAL LOW (ref 150–400)
RBC: 3 MIL/uL — ABNORMAL LOW (ref 3.87–5.11)
RDW: 15.8 % — ABNORMAL HIGH (ref 11.5–15.5)
WBC: 2.9 10*3/uL — ABNORMAL LOW (ref 4.0–10.5)
nRBC: 0 % (ref 0.0–0.2)

## 2022-03-21 MED ORDER — SODIUM CHLORIDE 0.9% FLUSH
10.0000 mL | Freq: Once | INTRAVENOUS | Status: AC | PRN
  Administered 2022-03-21: 10 mL
  Filled 2022-03-21: qty 10

## 2022-03-21 MED ORDER — ZOLEDRONIC ACID 4 MG/5ML IV CONC
3.3000 mg | Freq: Once | INTRAVENOUS | Status: AC
  Administered 2022-03-21: 3.3 mg via INTRAVENOUS
  Filled 2022-03-21: qty 4.13

## 2022-03-21 MED ORDER — HEPARIN SOD (PORK) LOCK FLUSH 100 UNIT/ML IV SOLN
250.0000 [IU] | Freq: Once | INTRAVENOUS | Status: AC | PRN
  Administered 2022-03-21: 500 [IU]
  Filled 2022-03-21: qty 5

## 2022-03-21 MED ORDER — DARATUMUMAB-HYALURONIDASE-FIHJ 1800-30000 MG-UT/15ML ~~LOC~~ SOLN
1800.0000 mg | Freq: Once | SUBCUTANEOUS | Status: AC
  Administered 2022-03-21: 1800 mg via SUBCUTANEOUS
  Filled 2022-03-21: qty 15

## 2022-03-21 MED ORDER — SODIUM CHLORIDE 0.9 % IV SOLN
INTRAVENOUS | Status: DC
  Filled 2022-03-21 (×2): qty 250

## 2022-03-21 NOTE — Progress Notes (Signed)
Pt states she took dex, tylenol, and benadryl as premedications prior to infusion appt.

## 2022-03-21 NOTE — Patient Instructions (Signed)
[facility] [place] [facility]  Discharge Instructions: Thank you for choosing [facility] to provide your oncology and hematology care.  If you have a lab appointment with the Cancer Center, please go directly to the Cancer Center and check in at the registration area.  Wear comfortable clothing and clothing appropriate for easy access to any Portacath or PICC line.   We strive to give you quality time with your provider. You may need to reschedule your appointment if you arrive late (15 or more minutes).  Arriving late affects you and other patients whose appointments are after yours.  Also, if you miss three or more appointments without notifying the office, you may be dismissed from the clinic at the provider's discretion.      For prescription refill requests, have your pharmacy contact our office and allow 72 hours for refills to be completed.    Today you received the following chemotherapy and/or immunotherapy agents DARZALEX and ZOMETA      To help prevent nausea and vomiting after your treatment, we encourage you to take your nausea medication as directed.  BELOW ARE SYMPTOMS THAT SHOULD BE REPORTED IMMEDIATELY: *FEVER GREATER THAN 100.4 F (38 C) OR HIGHER *CHILLS OR SWEATING *NAUSEA AND VOMITING THAT IS NOT CONTROLLED WITH YOUR NAUSEA MEDICATION *UNUSUAL SHORTNESS OF BREATH *UNUSUAL BRUISING OR BLEEDING *URINARY PROBLEMS (pain or burning when urinating, or frequent urination) *BOWEL PROBLEMS (unusual diarrhea, constipation, pain near the anus) TENDERNESS IN MOUTH AND THROAT WITH OR WITHOUT PRESENCE OF ULCERS (sore throat, sores in mouth, or a toothache) UNUSUAL RASH, SWELLING OR PAIN  UNUSUAL VAGINAL DISCHARGE OR ITCHING   Items with * indicate a potential emergency and should be followed up as soon as possible or go to the Emergency Department if any problems should occur.  Please show the CHEMOTHERAPY ALERT CARD or IMMUNOTHERAPY ALERT CARD at  check-in to the Emergency Department and triage nurse.  Should you have questions after your visit or need to cancel or reschedule your appointment, please contact [facility] [place] [facility]  [phone] and follow the prompts.  Office hours are 8:00 a.m. to 4:30 p.m. Monday - Friday. Please note that voicemails left after 4:00 p.m. may not be returned until the following business day.  We are closed weekends and major holidays. You have access to a nurse at all times for urgent questions. Please call the main number to the clinic [phone] and follow the prompts.  For any non-urgent questions, you may also contact your provider using MyChart. We now offer e-Visits for anyone 18 and older to request care online for non-urgent symptoms. For details visit [email or web address].   Also download the MyChart app! Go to the app store, search "MyChart", open the app, select [facility], and log in with your MyChart username and password.  Masks are optional in the cancer centers. If you would like for your care team to wear a mask while they are taking care of you, please let them know. For doctor visits, patients may have with them one support person who is at least 66 years old. At this time, visitors are not allowed in the infusion area. 

## 2022-03-21 NOTE — Progress Notes (Signed)
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  INTERVAL HISTORY: Patient returns to clinic today for further evaluation and consideration of cycle 14 of maintenance daratumumab, Revlimid, and Zometa.  She currently feels well and is asymptomatic.  She has now started an exercise program.  She has also started her revaccination schedule at [facility]. She continues to have back pain, but this is well-controlled on her current dose of tramadol.  The peripheral neuropathy in her feet is essentially unchanged.  She has no other neurologic complaints.  She denies any recent fevers or illnesses.  She has no chest pain, shortness of breath, cough, or hemoptysis.  She denies any nausea, vomiting, constipation, or diarrhea.  She has no urinary complaints.  Patient offers no further specific complaints today.  REVIEW OF SYSTEMS:   Review of Systems  Constitutional: Negative.  Negative for fever, malaise/fatigue and weight loss.  Respiratory: Negative.  Negative for cough, hemoptysis and shortness of breath.   Cardiovascular:  Negative for chest pain and leg swelling.  Gastrointestinal: Negative.  Negative for abdominal pain, diarrhea and nausea.  Genitourinary: Negative.  Negative for dysuria.  Musculoskeletal:  Positive for back pain. Negative for joint pain and neck pain.  Skin: Negative.  Negative for rash.  Neurological:  Positive for sensory change. Negative for dizziness, tremors, focal weakness, weakness and headaches.  Psychiatric/Behavioral: Negative.  The patient is not nervous/anxious.     As per HPI. Otherwise, a complete review of systems is negative.  PAST MEDICAL  HISTORY: Past Medical History:  Diagnosis Date   Anxiety    Diabetes mellitus without complication ([place]) 7494   GERD (gastroesophageal reflux disease)    Hyperlipidemia    Myeloma ([place])    Myeloma ([place])    Personal history of colonic polyps    Squamous cell carcinoma of skin 11/25/2013   Left chest. WD SCC with superficial infiltration.   Tachycardia     PAST SURGICAL HISTORY: Past Surgical History:  Procedure Laterality Date   AUGMENTATION MAMMAPLASTY Bilateral 4967   silicone and replacement in 2001   [place], [place], 1998,2003, 2008, 2013   COLONOSCOPY WITH PROPOFOL N/A 08/16/2016   Procedure: COLONOSCOPY WITH PROPOFOL;  Surgeon: Robert Bellow, MD;  Location: [facility] ENDOSCOPY;  Service: Endoscopy;  Laterality: N/A;   COLONOSCOPY WITH PROPOFOL N/A 10/05/2021   Procedure: COLONOSCOPY WITH PROPOFOL;  Surgeon: Robert Bellow, MD;  Location: [facility] ENDOSCOPY;  Service: Endoscopy;  Laterality: N/A;  HAS A PORT, NEEDS AMPICILLIN PER OFFICE   DILATION AND CURETTAGE OF UTERUS     ENDOMETRIAL ABLATION  12/1991   ganglion cyst removal      IR IMAGING GUIDED PORT INSERTION  10/27/2020   IR IMAGING GUIDED PORT INSERTION  04/04/2021   KYPHOPLASTY N/A 10/28/2020   Procedure: T12 and L3 KYPHOPLASTY;  Surgeon: Hessie Knows, MD;  Location: [facility] ORS;  Service: Orthopedics;  Laterality: N/A;   TONSILLECTOMY     WRIST SURGERY Right 05/1995    FAMILY HISTORY: Family History  Problem Relation Age of Onset   Colon polyps Sister    Colon cancer Father 26   Diabetes Mother  Breast cancer Neg Hx     ADVANCED DIRECTIVES (Y/N):  N  HEALTH MAINTENANCE: Social History   Tobacco Use   Smoking status: Former    Packs/day: 1.00    Years: 4.00    Total pack years: 4.00    Types: Cigarettes    Quit date: 1982    Years since quitting: 42.0   Smokeless tobacco: Never  Vaping Use   Vaping Use: Never used  Substance Use  Topics   Alcohol use: Yes    Alcohol/week: 1.0 - 2.0 standard drink of alcohol    Types: 1 - 2 Glasses of wine per week    Comment: occassionally   Drug use: No     Colonoscopy:  PAP:  Bone density:  Lipid panel:  No Known Allergies  Current Outpatient Medications  Medication Sig Dispense Refill   acetaminophen (TYLENOL) 325 MG tablet Take 650 mg by mouth 2 (two) times a week. Premed for velcade. Taking Tuesdays and Friday     acyclovir (ZOVIRAX) 400 MG tablet TAKE 1 TABLET BY MOUTH TWICE DAILY 60 tablet 5   ALPRAZolam (XANAX) 0.25 MG tablet TAKE 1 TABLET BY MOUTH AT BEDTIME AS NEEDED FOR SLEEP 30 tablet 2   ASPIRIN 81 PO Take 81 mg by mouth daily.     atorvastatin (LIPITOR) 20 MG tablet Take 20 mg by mouth daily.     busPIRone (BUSPAR) 30 MG tablet Take 30 mg by mouth 2 (two) times daily.     CALCIUM-VITAMIN D PO Take by mouth 2 (two) times daily.     cyclobenzaprine (FLEXERIL) 10 MG tablet TAKE 1 TABLET BY MOUTH AT BEDTIME 30 tablet 2   dexamethasone (DECADRON) 4 MG tablet TAKE 5 TABLETS BY MOUTH ONCE A WEEK 20 tablet 3   diphenhydrAMINE (BENADRYL) 25 mg capsule Take 25 mg by mouth 2 (two) times a week. Pre med for velcade. Only Tuesdays and Fridays     diphenoxylate-atropine (LOMOTIL) 2.5-0.025 MG tablet Take by mouth as needed.     gabapentin (NEURONTIN) 300 MG capsule TAKE 1 CAPSULE BY MOUTH TWICE DAILY 60 capsule 2   ivabradine (CORLANOR) 5 MG TABS tablet Take 5 mg by mouth 2 (two) times daily with a meal.     lenalidomide (REVLIMID) 10 MG capsule TAKE 1 CAPSULE BY MOUTH 1 TIME A DAY FOR 21 DAYS ON THEN 7 DAYS OFF 21 capsule 0   lidocaine-prilocaine (EMLA) cream APPLY 1 APPLICATION TOPICALLY AS NEEDED 30 g 2   liraglutide (VICTOZA) 18 MG/3ML SOPN Inject 1.2 mg into the skin daily.     Magnesium 400 MG TABS Take 1 tablet by mouth daily.     meloxicam (MOBIC) 15 MG tablet Take 15 mg by mouth daily.     methocarbamol (ROBAXIN) 500 MG tablet Take 1 tablet (500 mg total) by mouth  every 6 (six) hours as needed for muscle spasms. 40 tablet 1   Multiple Vitamin (MULTI-VITAMIN) tablet Take 1 tablet by mouth daily.     omeprazole (PRILOSEC) 40 MG capsule Take 40 mg by mouth daily.     ondansetron (ZOFRAN) 8 MG tablet Take 1 tablet (8 mg total) by mouth 2 (two) times daily as needed (Nausea or vomiting). 60 tablet 2   oxyCODONE-acetaminophen (PERCOCET/ROXICET) 5-325 MG tablet TAKE 1-2 TABLETS BY MOUTH EVERY 4 HOURS AS NEEDED FOR SEVERE PAIN 90 tablet 0   prochlorperazine (COMPAZINE) 10 MG tablet TAKE 1 TABLET BY MOUTH EVERY 6 HOURS AS NEEDED FOR NAUSEA OR VOMITING 60 tablet  2   traMADol (ULTRAM) 50 MG tablet Take 1 tablet (50 mg total) by mouth every 6 (six) hours as needed. Take 1 to 2 tablets (100 mg total) as needed for pain. 120 tablet 2   No current facility-administered medications for this visit.   Facility-Administered Medications Ordered in Other Visits  Medication Dose Route Frequency Provider Last Rate Last Admin   0.9 %  sodium chloride infusion   Intravenous Continuous Lloyd Huger, MD   Stopped at 04/07/21 1142   sodium chloride flush (NS) 0.9 % injection 10 mL  10 mL Intravenous PRN Lloyd Huger, MD   10 mL at 11/23/20 0932    OBJECTIVE: Vitals:   03/21/22 0914  BP: 139/66  Pulse: 82  Resp: 16  Temp: 98.3 F (36.8 C)  SpO2: 98%     Body mass index is 25.43 kg/m.    ECOG FS:0 - Asymptomatic  General: Well-developed, well-nourished, no acute distress. Eyes: Pink conjunctiva, anicteric sclera. HEENT: Normocephalic, moist mucous membranes. Lungs: No audible wheezing or coughing. Heart: Regular rate and rhythm. Abdomen: Soft, nontender, no obvious distention. Musculoskeletal: No edema, cyanosis, or clubbing. Neuro: Alert, answering all questions appropriately. Cranial nerves grossly intact. Skin: No rashes or petechiae noted. Psych: Normal affect.  LAB RESULTS:  Lab Results  Component Value Date   NA 138 03/21/2022   K 3.9  03/21/2022   CL 106 03/21/2022   CO2 21 (L) 03/21/2022   GLUCOSE 174 (H) 03/21/2022   BUN 19 03/21/2022   CREATININE 1.42 (H) 03/21/2022   CALCIUM 8.9 03/21/2022   PROT 6.3 (L) 03/21/2022   ALBUMIN 3.8 03/21/2022   AST 27 03/21/2022   ALT 18 03/21/2022   ALKPHOS 82 03/21/2022   BILITOT 0.3 03/21/2022   GFRNONAA 41 (L) 03/21/2022    Lab Results  Component Value Date   WBC 2.9 (L) 03/21/2022   NEUTROABS 2.1 03/21/2022   HGB 9.9 (L) 03/21/2022   HCT 29.9 (L) 03/21/2022   MCV 99.7 03/21/2022   PLT 78 (L) 03/21/2022     STUDIES: No results found.  ONCOLOGY HISTORY:  Diagnosis confirmed from bone biopsy on August 17, 2020.  Initially her lambda free light chains were elevated at 432.1, but now are within normal limits at 12.1.  Immunoglobulins and SPEP are normal.  Bone marrow biopsy completed on August 26, 2020 revealed 25% plasma cells along with the deletion of 1p which is considered poor prognosis.  PET scan results from September 06, 2020 reviewed independently with 3 hypermetabolic lesions in right C5-6 facets, right iliac crest lesion, and L3 vertebral lesion.  After lengthy discussion with the patient, she agreed to pursue chemotherapy with daratumumab, Velcade, Revlimid, and dexamethasone followed by autologous bone marrow transplant.  Patient received weekly daratumumab for 4 cycles along with Velcade on days 1, 4, 8, and 11.  She also received Revlimid on days 1 through 14 of a 21-day cycle.  She completed cycle 4 of treatment on January 14, 2021.  Subsequent bone marrow biopsy on January 24, 2021 revealed complete remission with no evidence of myeloma.  Patient underwent autologous stem cell [facility] at [facility] on February 24, 2021.  She was readmitted to the hospital on March 04, 2021 with neutropenic fever, diarrhea, and sepsis-like symptoms.  Infectious disease work-up remains negative.   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  PLAN:       1.  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  She received consolidation with cycle 5 and 6 using daratumumab on day 1, Velcade on day 1, 4, 8, and 11, Revlimid 10 mg on days 1 through 14 with 7 days off, and weekly dexamethasone on a 21-day cycle.  She is now on maintenance daratumumab on day 1 and Revlimid 10 mg on days 1 through 21 on a 28-day cycle for cycles 7 through 32.  Repeat bone marrow biopsy on October 03, 2021 revealed no evidence of a myeloid neoplasm.  Repeat PET scan on February 16, 2022 reviewed independently with new bony hypermetabolism favoring sequelae of chronic compression fractures rather than progression of disease. Can consider repeating in June 2024.  Proceed with cycle 14 of treatment today. Patient takes all of her premedications at home prior to clinic, therefore these have been removed from the treatment plan.  Patient will also receive Zometa today.  Return to clinic in 4 weeks for further evaluation and consideration of cycle 15.   2.  Pain: Improved.  She has completed XRT.  She had kyphoplasty x2.  Continue tramadol.  Patient has now discontinued Percocet.  Repeat MRI results with essentially no change.  PET scan results as above.  Appreciate orthopedics input who recommended conservative management.   3.  Hypocalcemia: Resolved proceed with Zometa as above. 4.  Renal insufficiency: Chronic and unchanged.  Patient's creatinine is 1.42. 5.  Anemia: Chronic and unchanged.  Patient's hemoglobin is 9.9 today.  Can consider a dose reduction of Revlimid in the future if necessary. 6.  Thrombocytopenia: Chronic and unchanged.  Patient's platelet count is 78 today.  Consider dose reduction of Revlimid as above.   7.  Leukopenia: Chronic and unchanged.   8.  Vaccinations: Continue revaccination schedule as per [facility]. 9.  Transaminitis: Resolved. 10.  Pulmonary nodule: Patient noted to have a 9 mm  hypermetabolic pulmonary nodule in her left lower lobe lung.  Possibly infectious, continue to monitor closely.  Patient expressed understanding and was in agreement with this plan. She also understands that She can call clinic at any time with any questions, concerns, or complaints.      Cancer Staging  Lambda light chain myeloma ([place]) Staging form: Plasma Cell Myeloma and Plasma Cell Disorders, AJCC 8th Edition - Clinical stage from 09/16/2020: RISS Stage I (Beta-2-microglobulin (mg/L): 2.5, Albumin (g/dL): 4.9, ISS: Stage I, High-risk cytogenetics: Absent, LDH: Normal) - Signed by Lloyd Huger, MD on 09/16/2020 Beta 2 microglobulin range (mg/L): Less than 3.5 Albumin range (g/dL): Greater than or equal to 3.5 Cytogenetics: 1p deletion  Lloyd Huger, MD   03/21/2022 10:01 AM

## 2022-03-22 LAB — KAPPA/LAMBDA LIGHT CHAINS
Kappa free light chain: 11.2 mg/L (ref 3.3–19.4)
Kappa, lambda light chain ratio: 1.53 (ref 0.26–1.65)
Lambda free light chains: 7.3 mg/L (ref 5.7–26.3)

## 2022-03-29 ENCOUNTER — Other Ambulatory Visit: Payer: Self-pay | Admitting: Oncology

## 2022-03-29 DIAGNOSIS — C9000 Multiple myeloma not having achieved remission: Secondary | ICD-10-CM

## 2022-03-31 ENCOUNTER — Other Ambulatory Visit: Payer: Self-pay | Admitting: Oncology

## 2022-03-31 DIAGNOSIS — C9000 Multiple myeloma not having achieved remission: Secondary | ICD-10-CM

## 2022-04-18 ENCOUNTER — Inpatient Hospital Stay: Payer: Medicare PPO | Attending: Oncology | Admitting: Oncology

## 2022-04-18 ENCOUNTER — Encounter: Payer: Self-pay | Admitting: Oncology

## 2022-04-18 ENCOUNTER — Ambulatory Visit: Payer: Medicare PPO | Admitting: Dermatology

## 2022-04-18 ENCOUNTER — Inpatient Hospital Stay: Payer: Medicare PPO

## 2022-04-18 ENCOUNTER — Other Ambulatory Visit: Payer: Self-pay | Admitting: *Deleted

## 2022-04-18 ENCOUNTER — Other Ambulatory Visit: Payer: Self-pay

## 2022-04-18 VITALS — BP 107/61 | HR 83 | Temp 98.8°F | Resp 18 | Ht 61.0 in | Wt 134.0 lb

## 2022-04-18 DIAGNOSIS — C9000 Multiple myeloma not having achieved remission: Secondary | ICD-10-CM

## 2022-04-18 DIAGNOSIS — Z923 Personal history of irradiation: Secondary | ICD-10-CM | POA: Insufficient documentation

## 2022-04-18 DIAGNOSIS — Z79899 Other long term (current) drug therapy: Secondary | ICD-10-CM | POA: Diagnosis not present

## 2022-04-18 DIAGNOSIS — R911 Solitary pulmonary nodule: Secondary | ICD-10-CM | POA: Insufficient documentation

## 2022-04-18 DIAGNOSIS — C9001 Multiple myeloma in remission: Secondary | ICD-10-CM | POA: Insufficient documentation

## 2022-04-18 DIAGNOSIS — D649 Anemia, unspecified: Secondary | ICD-10-CM | POA: Diagnosis not present

## 2022-04-18 DIAGNOSIS — Z5112 Encounter for antineoplastic immunotherapy: Secondary | ICD-10-CM | POA: Insufficient documentation

## 2022-04-18 DIAGNOSIS — N289 Disorder of kidney and ureter, unspecified: Secondary | ICD-10-CM | POA: Insufficient documentation

## 2022-04-18 DIAGNOSIS — D696 Thrombocytopenia, unspecified: Secondary | ICD-10-CM | POA: Diagnosis not present

## 2022-04-18 DIAGNOSIS — Z87891 Personal history of nicotine dependence: Secondary | ICD-10-CM | POA: Insufficient documentation

## 2022-04-18 DIAGNOSIS — Z9484 Stem cells transplant status: Secondary | ICD-10-CM | POA: Insufficient documentation

## 2022-04-18 DIAGNOSIS — R059 Cough, unspecified: Secondary | ICD-10-CM | POA: Diagnosis not present

## 2022-04-18 DIAGNOSIS — Z95828 Presence of other vascular implants and grafts: Secondary | ICD-10-CM

## 2022-04-18 LAB — CBC WITH DIFFERENTIAL/PLATELET
Abs Immature Granulocytes: 0.01 10*3/uL (ref 0.00–0.07)
Basophils Absolute: 0 10*3/uL (ref 0.0–0.1)
Basophils Relative: 1 %
Eosinophils Absolute: 0.1 10*3/uL (ref 0.0–0.5)
Eosinophils Relative: 6 %
HCT: 29.5 % — ABNORMAL LOW (ref 36.0–46.0)
Hemoglobin: 9.3 g/dL — ABNORMAL LOW (ref 12.0–15.0)
Immature Granulocytes: 1 %
Lymphocytes Relative: 26 %
Lymphs Abs: 0.3 10*3/uL — ABNORMAL LOW (ref 0.7–4.0)
MCH: 32.2 pg (ref 26.0–34.0)
MCHC: 31.5 g/dL (ref 30.0–36.0)
MCV: 102.1 fL — ABNORMAL HIGH (ref 80.0–100.0)
Monocytes Absolute: 0.4 10*3/uL (ref 0.1–1.0)
Monocytes Relative: 30 %
Neutro Abs: 0.5 10*3/uL — ABNORMAL LOW (ref 1.7–7.7)
Neutrophils Relative %: 36 %
Platelets: 57 10*3/uL — ABNORMAL LOW (ref 150–400)
RBC: 2.89 MIL/uL — ABNORMAL LOW (ref 3.87–5.11)
RDW: 14.8 % (ref 11.5–15.5)
WBC: 1.3 10*3/uL — ABNORMAL LOW (ref 4.0–10.5)
nRBC: 0 % (ref 0.0–0.2)

## 2022-04-18 LAB — COMPREHENSIVE METABOLIC PANEL
ALT: 14 U/L (ref 0–44)
AST: 20 U/L (ref 15–41)
Albumin: 3.7 g/dL (ref 3.5–5.0)
Alkaline Phosphatase: 80 U/L (ref 38–126)
Anion gap: 8 (ref 5–15)
BUN: 17 mg/dL (ref 8–23)
CO2: 22 mmol/L (ref 22–32)
Calcium: 8.5 mg/dL — ABNORMAL LOW (ref 8.9–10.3)
Chloride: 105 mmol/L (ref 98–111)
Creatinine, Ser: 1.4 mg/dL — ABNORMAL HIGH (ref 0.44–1.00)
GFR, Estimated: 42 mL/min — ABNORMAL LOW (ref >60)
Glucose, Bld: 210 mg/dL — ABNORMAL HIGH (ref 70–99)
Potassium: 3.4 mmol/L — ABNORMAL LOW (ref 3.5–5.1)
Sodium: 135 mmol/L (ref 135–145)
Total Bilirubin: 0.5 mg/dL (ref 0.3–1.2)
Total Protein: 6.2 g/dL — ABNORMAL LOW (ref 6.5–8.1)

## 2022-04-18 MED ORDER — OXYCODONE-ACETAMINOPHEN 5-325 MG PO TABS: ORAL_TABLET | ORAL | 0 refills | Status: DC

## 2022-04-18 MED ORDER — SODIUM CHLORIDE 0.9% FLUSH
10.0000 mL | INTRAVENOUS | Status: AC | PRN
  Administered 2022-04-18: 10 mL via INTRAVENOUS
  Filled 2022-04-18: qty 10

## 2022-04-18 MED ORDER — LEVOFLOXACIN 500 MG PO TABS: 500.0000 mg | ORAL_TABLET | Freq: Every day | ORAL | 0 refills | Status: DC

## 2022-04-18 MED ORDER — HEPARIN SOD (PORK) LOCK FLUSH 100 UNIT/ML IV SOLN
500.0000 [IU] | Freq: Once | INTRAVENOUS | Status: AC
  Administered 2022-04-18: 500 [IU] via INTRAVENOUS
  Filled 2022-04-18: qty 5

## 2022-04-18 NOTE — Progress Notes (Signed)
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  INTERVAL HISTORY: Patient returns to clinic today for further evaluation and consideration of cycle 15 of maintenance daratumumab, Revlimid, and Zometa.  Over the past week she has had increased congestion, sinus drainage, and cough.  She also admits to low-grade fevers.  She otherwise has felt well.  She continues to have back pain, but this is well-controlled on her current dose of tramadol.  The peripheral neuropathy in her feet is essentially unchanged.  She has no other neurologic complaints.  She denies any chest pain or hemoptysis.  She denies any nausea, vomiting, constipation, or diarrhea.  She has no urinary complaints.  Patient offers no further specific complaints today.  REVIEW OF SYSTEMS:   Review of Systems  Constitutional: Negative.  Negative for fever, malaise/fatigue and weight loss.  HENT:  Positive for congestion and sinus pain.   Respiratory:  Positive for cough. Negative for hemoptysis and shortness of breath.   Cardiovascular:  Negative for chest pain and leg swelling.  Gastrointestinal: Negative.  Negative for abdominal pain, diarrhea and nausea.  Genitourinary: Negative.  Negative for dysuria.  Musculoskeletal:  Positive for back pain. Negative for joint pain and neck pain.  Skin: Negative.  Negative for rash.  Neurological:  Positive for sensory change. Negative for dizziness, tremors, focal weakness, weakness and headaches.  Psychiatric/Behavioral: Negative.  The patient is not nervous/anxious.     As per HPI. Otherwise, a complete review of systems is negative.  PAST MEDICAL HISTORY: Past Medical History:   Diagnosis Date   Anxiety    Diabetes mellitus without complication ([place]) Q000111Q   GERD (gastroesophageal reflux disease)    Hyperlipidemia    Myeloma ([place])    Myeloma ([place])    Personal history of colonic polyps    Squamous cell carcinoma of skin 11/25/2013   Left chest. WD SCC with superficial infiltration.   Tachycardia     PAST SURGICAL HISTORY: Past Surgical History:  Procedure Laterality Date   AUGMENTATION MAMMAPLASTY Bilateral Q000111Q   silicone and replacement in 2001   [place], [place], 1998,2003, 2008, 2013   COLONOSCOPY WITH PROPOFOL N/A 08/16/2016   Procedure: COLONOSCOPY WITH PROPOFOL;  Surgeon: Robert Bellow, MD;  Location: [facility] ENDOSCOPY;  Service: Endoscopy;  Laterality: N/A;   COLONOSCOPY WITH PROPOFOL N/A 10/05/2021   Procedure: COLONOSCOPY WITH PROPOFOL;  Surgeon: Robert Bellow, MD;  Location: [facility] ENDOSCOPY;  Service: Endoscopy;  Laterality: N/A;  HAS A PORT, NEEDS AMPICILLIN PER OFFICE   DILATION AND CURETTAGE OF UTERUS     ENDOMETRIAL ABLATION  12/1991   ganglion cyst removal      IR IMAGING GUIDED PORT INSERTION  10/27/2020   IR IMAGING GUIDED PORT INSERTION  04/04/2021   KYPHOPLASTY N/A 10/28/2020   Procedure: T12 and L3 KYPHOPLASTY;  Surgeon: Hessie Knows, MD;  Location: [facility] ORS;  Service: Orthopedics;  Laterality: N/A;   TONSILLECTOMY     WRIST SURGERY Right 05/1995    FAMILY HISTORY: Family History  Problem Relation Age of Onset   Colon polyps Sister    Colon cancer Father 76   Diabetes Mother  Breast cancer Neg Hx     ADVANCED DIRECTIVES (Y/N):  N  HEALTH MAINTENANCE: Social History   Tobacco Use   Smoking status: Former    Packs/day: 1.00    Years: 4.00    Total pack years: 4.00    Types: Cigarettes    Quit date: 1982    Years since quitting: 42.1   Smokeless tobacco: Never  Vaping Use   Vaping Use: Never used  Substance Use Topics   Alcohol use: Yes     Alcohol/week: 1.0 - 2.0 standard drink of alcohol    Types: 1 - 2 Glasses of wine per week    Comment: occassionally   Drug use: No     Colonoscopy:  PAP:  Bone density:  Lipid panel:  No Known Allergies  Current Outpatient Medications  Medication Sig Dispense Refill   acetaminophen (TYLENOL) 325 MG tablet Take 650 mg by mouth 2 (two) times a week. Premed for velcade. Taking Tuesdays and Friday     acyclovir (ZOVIRAX) 400 MG tablet TAKE 1 TABLET BY MOUTH TWICE DAILY 60 tablet 5   ALPRAZolam (XANAX) 0.25 MG tablet TAKE 1 TABLET BY MOUTH AT BEDTIME AS NEEDED FOR SLEEP 30 tablet 2   ASPIRIN 81 PO Take 81 mg by mouth daily.     atorvastatin (LIPITOR) 20 MG tablet Take 20 mg by mouth daily.     busPIRone (BUSPAR) 30 MG tablet Take 30 mg by mouth 2 (two) times daily.     CALCIUM-VITAMIN D PO Take by mouth 2 (two) times daily.     cyclobenzaprine (FLEXERIL) 10 MG tablet TAKE 1 TABLET BY MOUTH AT BEDTIME 30 tablet 2   dexamethasone (DECADRON) 4 MG tablet TAKE 5 TABLETS BY MOUTH ONCE A WEEK 20 tablet 3   diphenhydrAMINE (BENADRYL) 25 mg capsule Take 25 mg by mouth 2 (two) times a week. Pre med for velcade. Only Tuesdays and Fridays     diphenoxylate-atropine (LOMOTIL) 2.5-0.025 MG tablet Take by mouth as needed.     gabapentin (NEURONTIN) 300 MG capsule TAKE 1 CAPSULE BY MOUTH TWICE DAILY 60 capsule 2   ivabradine (CORLANOR) 5 MG TABS tablet Take 5 mg by mouth 2 (two) times daily with a meal.     lenalidomide (REVLIMID) 10 MG capsule TAKE 1 CAPSULE BY MOUTH 1 TIME A DAY FOR 21 DAYS ON THEN 7 DAYS OFF 21 capsule 0   levofloxacin (LEVAQUIN) 500 MG tablet Take 1 tablet (500 mg total) by mouth daily. 7 tablet 0   lidocaine-prilocaine (EMLA) cream APPLY 1 APPLICATION TOPICALLY AS NEEDED 30 g 2   liraglutide (VICTOZA) 18 MG/3ML SOPN Inject 1.2 mg into the skin daily.     Magnesium 400 MG TABS Take 1 tablet by mouth daily.     meloxicam (MOBIC) 15 MG tablet Take 15 mg by mouth daily.      methocarbamol (ROBAXIN) 500 MG tablet Take 1 tablet (500 mg total) by mouth every 6 (six) hours as needed for muscle spasms. 40 tablet 1   Multiple Vitamin (MULTI-VITAMIN) tablet Take 1 tablet by mouth daily.     omeprazole (PRILOSEC) 40 MG capsule Take 40 mg by mouth daily.     ondansetron (ZOFRAN) 8 MG tablet Take 1 tablet (8 mg total) by mouth 2 (two) times daily as needed (Nausea or vomiting). 60 tablet 2   prochlorperazine (COMPAZINE) 10 MG tablet TAKE 1 TABLET BY MOUTH EVERY 6 HOURS AS NEEDED FOR NAUSEA OR VOMITING 60 tablet 2   traMADol (  ULTRAM) 50 MG tablet Take 1 tablet (50 mg total) by mouth every 6 (six) hours as needed. Take 1 to 2 tablets (100 mg total) as needed for pain. 120 tablet 2   oxyCODONE-acetaminophen (PERCOCET/ROXICET) 5-325 MG tablet TAKE 1-2 TABLETS BY MOUTH EVERY 4 HOURS AS NEEDED FOR SEVERE PAIN 90 tablet 0   No current facility-administered medications for this visit.   Facility-Administered Medications Ordered in Other Visits  Medication Dose Route Frequency Provider Last Rate Last Admin   0.9 %  sodium chloride infusion   Intravenous Continuous Lloyd Huger, MD   Stopped at 04/07/21 1142   sodium chloride flush (NS) 0.9 % injection 10 mL  10 mL Intravenous PRN Lloyd Huger, MD   10 mL at 11/23/20 0932   sodium chloride flush (NS) 0.9 % injection 10 mL  10 mL Intravenous PRN Lloyd Huger, MD   10 mL at 04/18/22 1417    OBJECTIVE: Vitals:   04/18/22 1430  BP: 107/61  Pulse: 83  Resp: 18  Temp: 98.8 F (37.1 C)  SpO2: 100%     Body mass index is 25.32 kg/m.    ECOG FS:1 - Symptomatic but completely ambulatory  General: Well-developed, well-nourished, no acute distress. Eyes: Pink conjunctiva, anicteric sclera. HEENT: Normocephalic, moist mucous membranes. Lungs: No audible wheezing or coughing. Heart: Regular rate and rhythm. Abdomen: Soft, nontender, no obvious distention. Musculoskeletal: No edema, cyanosis, or clubbing. Neuro:  Alert, answering all questions appropriately. Cranial nerves grossly intact. Skin: No rashes or petechiae noted. Psych: Normal affect.  LAB RESULTS:  Lab Results  Component Value Date   NA 135 04/18/2022   K 3.4 (L) 04/18/2022   CL 105 04/18/2022   CO2 22 04/18/2022   GLUCOSE 210 (H) 04/18/2022   BUN 17 04/18/2022   CREATININE 1.40 (H) 04/18/2022   CALCIUM 8.5 (L) 04/18/2022   PROT 6.2 (L) 04/18/2022   ALBUMIN 3.7 04/18/2022   AST 20 04/18/2022   ALT 14 04/18/2022   ALKPHOS 80 04/18/2022   BILITOT 0.5 04/18/2022   GFRNONAA 42 (L) 04/18/2022    Lab Results  Component Value Date   WBC 1.3 (L) 04/18/2022   NEUTROABS 0.5 (L) 04/18/2022   HGB 9.3 (L) 04/18/2022   HCT 29.5 (L) 04/18/2022   MCV 102.1 (H) 04/18/2022   PLT 57 (L) 04/18/2022     STUDIES: No results found.  ONCOLOGY HISTORY:  Diagnosis confirmed from bone biopsy on August 17, 2020.  Initially her lambda free light chains were elevated at 432.1, but now are within normal limits at 12.1.  Immunoglobulins and SPEP are normal.  Bone marrow biopsy completed on August 26, 2020 revealed 25% plasma cells along with the deletion of 1p which is considered poor prognosis.  PET scan results from September 06, 2020 reviewed independently with 3 hypermetabolic lesions in right C5-6 facets, right iliac crest lesion, and L3 vertebral lesion.  After lengthy discussion with the patient, she agreed to pursue chemotherapy with daratumumab, Velcade, Revlimid, and dexamethasone followed by autologous bone marrow transplant.  Patient received weekly daratumumab for 4 cycles along with Velcade on days 1, 4, 8, and 11.  She also received Revlimid on days 1 through 14 of a 21-day cycle.  She completed cycle 4 of treatment on January 14, 2021.  Subsequent bone marrow biopsy on January 24, 2021 revealed complete remission with no evidence of myeloma.  Patient underwent autologous [facility] cell [facility] on February 24, 2021.  She was  readmitted to the hospital on March 04, 2021 with neutropenic fever, diarrhea, and sepsis-like symptoms.  Infectious disease work-up remains negative.   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  PLAN:      Lambda light chain myeloma with 1p del:  Patient now using consolidation treatment with the GRIFFIN regimen.  She received consolidation with cycle 5 and 6 using daratumumab on day 1, Velcade on day 1, 4, 8, and 11, Revlimid 10 mg on days 1 through 14 with 7 days off, and weekly dexamethasone on a 21-day cycle.  She is now on maintenance daratumumab on day 1 and Revlimid 10 mg on days 1 through 21 on a 28-day cycle for cycles 7 through 32.  Repeat bone marrow biopsy on October 03, 2021 revealed no evidence of a myeloid neoplasm.  Repeat PET scan on February 16, 2022 reviewed independently with new bony hypermetabolism favoring sequelae of chronic compression fractures rather than progression of disease. Can consider repeating in June 2024.  Delay cycle 15 of treatment today.  Patient takes all of her premedications at home prior to clinic, therefore these have been removed from the treatment plan.  Return to clinic in 1 week for further evaluation and reconsideration of cycle 15. Pain: Improved.  She has completed XRT.  She had kyphoplasty x2.  Continue tramadol.  Patient only uses Percocet sparingly and is given a refill today.  Repeat MRI results with essentially no change.  PET scan results as above.  Appreciate orthopedics input who recommended conservative management.   Hypocalcemia: Mild.  Zometa has been delayed until next week. Renal insufficiency: Chronic and unchanged.  Patient's creatinine is 1.40. Anemia: Hemoglobin has trended down slightly to 9.3.  Can consider a dose reduction of Revlimid in the future if necessary. Thrombocytopenia: Platelet count decreased to 57.  Hold treatment 1 week as above. Neutropenia: Patient's ANC is 0.5.  Hold treatment 1 week  as above. Vaccinations: Continue revaccination schedule as per [facility]. Pulmonary nodule: Patient noted to have a 9 mm hypermetabolic pulmonary nodule in her left lower lobe lung.  Possibly infectious, continue to monitor closely. Cough/congestion: Patient was given a prescription for 7 days of Levaquin.  Hold treatment 1 week.  Patient expressed understanding and was in agreement with this plan. She also understands that She can call clinic at any time with any questions, concerns, or complaints.      Cancer Staging  Lambda light chain myeloma ([place]) Staging form: Plasma Cell Myeloma and Plasma Cell Disorders, AJCC 8th Edition - Clinical stage from 09/16/2020: RISS Stage I (Beta-2-microglobulin (mg/L): 2.5, Albumin (g/dL): 4.9, ISS: Stage I, High-risk cytogenetics: Absent, LDH: Normal) - Signed by Lloyd Huger, MD on 09/16/2020 Beta 2 microglobulin range (mg/L): Less than 3.5 Albumin range (g/dL): Greater than or equal to 3.5 Cytogenetics: 1p deletion  Lloyd Huger, MD   04/18/2022 6:37 PM

## 2022-04-18 NOTE — Progress Notes (Signed)
States she has had a head cold for the last week and is not feeling well today. Started in her head and now feels it going to her chest. Tested for Covid 1 week ago when it first started and it was negative. Does state she has SOB and had a fever a couple days ago of 100.4.

## 2022-04-19 LAB — KAPPA/LAMBDA LIGHT CHAINS
Kappa free light chain: 10.1 mg/L (ref 3.3–19.4)
Kappa, lambda light chain ratio: 1.13 (ref 0.26–1.65)
Lambda free light chains: 8.9 mg/L (ref 5.7–26.3)

## 2022-04-25 ENCOUNTER — Inpatient Hospital Stay: Payer: Medicare PPO

## 2022-04-25 ENCOUNTER — Inpatient Hospital Stay (HOSPITAL_BASED_OUTPATIENT_CLINIC_OR_DEPARTMENT_OTHER): Payer: Medicare PPO | Admitting: Oncology

## 2022-04-25 ENCOUNTER — Inpatient Hospital Stay (HOSPITAL_BASED_OUTPATIENT_CLINIC_OR_DEPARTMENT_OTHER): Payer: Medicare PPO

## 2022-04-25 ENCOUNTER — Encounter: Payer: Self-pay | Admitting: Oncology

## 2022-04-25 DIAGNOSIS — C9000 Multiple myeloma not having achieved remission: Secondary | ICD-10-CM

## 2022-04-25 DIAGNOSIS — Z5112 Encounter for antineoplastic immunotherapy: Secondary | ICD-10-CM | POA: Diagnosis not present

## 2022-04-25 LAB — CBC WITH DIFFERENTIAL/PLATELET
Abs Immature Granulocytes: 0.01 10*3/uL (ref 0.00–0.07)
Basophils Absolute: 0 10*3/uL (ref 0.0–0.1)
Basophils Relative: 1 %
Eosinophils Absolute: 0.1 10*3/uL (ref 0.0–0.5)
Eosinophils Relative: 3 %
HCT: 29.7 % — ABNORMAL LOW (ref 36.0–46.0)
Hemoglobin: 9.6 g/dL — ABNORMAL LOW (ref 12.0–15.0)
Immature Granulocytes: 0 %
Lymphocytes Relative: 20 %
Lymphs Abs: 0.7 10*3/uL (ref 0.7–4.0)
MCH: 33 pg (ref 26.0–34.0)
MCHC: 32.3 g/dL (ref 30.0–36.0)
MCV: 102.1 fL — ABNORMAL HIGH (ref 80.0–100.0)
Monocytes Absolute: 0.5 10*3/uL (ref 0.1–1.0)
Monocytes Relative: 14 %
Neutro Abs: 2.1 10*3/uL (ref 1.7–7.7)
Neutrophils Relative %: 62 %
Platelets: 60 10*3/uL — ABNORMAL LOW (ref 150–400)
RBC: 2.91 MIL/uL — ABNORMAL LOW (ref 3.87–5.11)
RDW: 15 % (ref 11.5–15.5)
WBC: 3.5 10*3/uL — ABNORMAL LOW (ref 4.0–10.5)
nRBC: 0 % (ref 0.0–0.2)

## 2022-04-25 LAB — COMPREHENSIVE METABOLIC PANEL
ALT: 16 U/L (ref 0–44)
AST: 24 U/L (ref 15–41)
Albumin: 3.8 g/dL (ref 3.5–5.0)
Alkaline Phosphatase: 86 U/L (ref 38–126)
Anion gap: 7 (ref 5–15)
BUN: 16 mg/dL (ref 8–23)
CO2: 23 mmol/L (ref 22–32)
Calcium: 8.8 mg/dL — ABNORMAL LOW (ref 8.9–10.3)
Chloride: 106 mmol/L (ref 98–111)
Creatinine, Ser: 1.33 mg/dL — ABNORMAL HIGH (ref 0.44–1.00)
GFR, Estimated: 44 mL/min — ABNORMAL LOW (ref >60)
Glucose, Bld: 108 mg/dL — ABNORMAL HIGH (ref 70–99)
Potassium: 3.5 mmol/L (ref 3.5–5.1)
Sodium: 136 mmol/L (ref 135–145)
Total Bilirubin: 0.4 mg/dL (ref 0.3–1.2)
Total Protein: 6.3 g/dL — ABNORMAL LOW (ref 6.5–8.1)

## 2022-04-25 MED ORDER — SODIUM CHLORIDE 0.9 % IV SOLN
INTRAVENOUS | Status: DC
  Filled 2022-04-25: qty 250

## 2022-04-25 MED ORDER — HEPARIN SOD (PORK) LOCK FLUSH 100 UNIT/ML IV SOLN
500.0000 [IU] | Freq: Once | INTRAVENOUS | Status: AC
  Administered 2022-04-25: 500 [IU] via INTRAVENOUS
  Filled 2022-04-25: qty 5

## 2022-04-25 MED ORDER — HEPARIN SOD (PORK) LOCK FLUSH 100 UNIT/ML IV SOLN
250.0000 [IU] | Freq: Once | INTRAVENOUS | Status: DC | PRN
  Filled 2022-04-25: qty 5

## 2022-04-25 MED ORDER — ZOLEDRONIC ACID 4 MG/5ML IV CONC
3.3000 mg | Freq: Once | INTRAVENOUS | Status: AC
  Administered 2022-04-25: 3.3 mg via INTRAVENOUS
  Filled 2022-04-25: qty 4.13

## 2022-04-25 MED ORDER — DARATUMUMAB-HYALURONIDASE-FIHJ 1800-30000 MG-UT/15ML ~~LOC~~ SOLN
1800.0000 mg | Freq: Once | SUBCUTANEOUS | Status: AC
  Administered 2022-04-25: 1800 mg via SUBCUTANEOUS
  Filled 2022-04-25: qty 15

## 2022-04-25 MED ORDER — SODIUM CHLORIDE 0.9% FLUSH
10.0000 mL | Freq: Once | INTRAVENOUS | Status: AC | PRN
  Administered 2022-04-25: 10 mL
  Filled 2022-04-25: qty 10

## 2022-04-25 NOTE — Progress Notes (Signed)
Pt took tylenol, benadryl, and dexamethasone prior to treatment today.

## 2022-04-25 NOTE — Patient Instructions (Signed)
[facility]  Discharge Instructions: Thank you for choosing [facility] to provide your oncology and hematology care.  If you have a lab appointment with the [facility], please go directly to the [facility] and check in at the registration area.  Wear comfortable clothing and clothing appropriate for easy access to any Portacath or PICC line.   We strive to give you quality time with your provider. You may need to reschedule your appointment if you arrive late (15 or more minutes).  Arriving late affects you and other patients whose appointments are after yours.  Also, if you miss three or more appointments without notifying the office, you may be dismissed from the clinic at the provider's discretion.      For prescription refill requests, have your pharmacy contact our office and allow 72 hours for refills to be completed.    Today you received the following chemotherapy and/or immunotherapy agents DARZALEX and ZOMETA      To help prevent nausea and vomiting after your treatment, we encourage you to take your nausea medication as directed.  BELOW ARE SYMPTOMS THAT SHOULD BE REPORTED IMMEDIATELY: *FEVER GREATER THAN 100.4 F (38 C) OR HIGHER *CHILLS OR SWEATING *NAUSEA AND VOMITING THAT IS NOT CONTROLLED WITH YOUR NAUSEA MEDICATION *UNUSUAL SHORTNESS OF BREATH *UNUSUAL BRUISING OR BLEEDING *URINARY PROBLEMS (pain or burning when urinating, or frequent urination) *BOWEL PROBLEMS (unusual diarrhea, constipation, pain near the anus) TENDERNESS IN MOUTH AND THROAT WITH OR WITHOUT PRESENCE OF ULCERS (sore throat, sores in mouth, or a toothache) UNUSUAL RASH, SWELLING OR PAIN  UNUSUAL VAGINAL DISCHARGE OR ITCHING   Items with * indicate a potential emergency and should be followed up as soon as possible or go to the Emergency Department if any problems should occur.  Please show the CHEMOTHERAPY ALERT CARD or IMMUNOTHERAPY ALERT CARD at  check-in to the Emergency Department and triage nurse.  Should you have questions after your visit or need to cancel or reschedule your appointment, please contact [facility]  [phone] and follow the prompts.  Office hours are 8:00 a.m. to 4:30 p.m. Monday - Friday. Please note that voicemails left after 4:00 p.m. may not be returned until the following business day.  We are closed weekends and major holidays. You have access to a nurse at all times for urgent questions. Please call the main number to the clinic [phone] and follow the prompts.  For any non-urgent questions, you may also contact your provider using MyChart. We now offer e-Visits for anyone 72 and older to request care online for non-urgent symptoms. For details visit [email or web address].   Also download the MyChart app! Go to the app store, search "MyChart", open the app, select [facility], and log in with your MyChart username and password.  Masks are optional in the cancer centers. If you would like for your care team to wear a mask while they are taking care of you, please let them know. For doctor visits, patients may have with them one support person who is at least 66 years old. At this time, visitors are not allowed in the infusion area.

## 2022-04-25 NOTE — Progress Notes (Unsigned)
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  INTERVAL HISTORY: Patient returns to clinic today for further evaluation and reconsideration of cycle 15 of maintenance daratumumab, Revlimid, and Zometa.  Her congestion and cough have resolved, but since initiating Levaquin she has noticed increased lower leg pain.  She otherwise feels well.  She continues to have back pain, but this is well-controlled on her current dose of tramadol.  The peripheral neuropathy in her feet is essentially unchanged.  She has no other neurologic complaints.  She denies any chest pain or hemoptysis.  She denies any nausea, vomiting, constipation, or diarrhea.  She has no urinary complaints.  Patient offers no further specific complaints today.  REVIEW OF SYSTEMS:   Review of Systems  Constitutional: Negative.  Negative for fever, malaise/fatigue and weight loss.  HENT: Negative.  Negative for congestion and sinus pain.   Respiratory: Negative.  Negative for cough, hemoptysis and shortness of breath.   Cardiovascular:  Negative for chest pain and leg swelling.  Gastrointestinal: Negative.  Negative for abdominal pain, diarrhea and nausea.  Genitourinary: Negative.  Negative for dysuria.  Musculoskeletal:  Positive for back pain. Negative for joint pain and neck pain.  Skin: Negative.  Negative for rash.  Neurological:  Positive for sensory change. Negative for dizziness, tremors, focal weakness, weakness and headaches.  Psychiatric/Behavioral: Negative.  The patient is not nervous/anxious.     As per HPI. Otherwise, a complete review of systems is negative.  PAST MEDICAL HISTORY: Past Medical History:   Diagnosis Date   Anxiety    Diabetes mellitus without complication (Hannah Mcdonald) Q000111Q   GERD (gastroesophageal reflux disease)    Hyperlipidemia    Myeloma (Hannah Mcdonald)    Myeloma (Hannah Mcdonald)    Personal history of colonic polyps    Squamous cell carcinoma of skin 11/25/2013   Left chest. WD SCC with superficial infiltration.   Tachycardia     PAST SURGICAL HISTORY: Past Surgical History:  Procedure Laterality Date   AUGMENTATION MAMMAPLASTY Bilateral Q000111Q   silicone and replacement in 2001   [place], [place], 1998,2003, 2008, 2013   COLONOSCOPY WITH PROPOFOL N/A 08/16/2016   Procedure: COLONOSCOPY WITH PROPOFOL;  Surgeon: Robert Bellow, MD;  Location: [facility] END[facility]COPY;  Service: Endoscopy;  Laterality: N/A;   COLONOSCOPY WITH PROPOFOL N/A 10/05/2021   Procedure: COLONOSCOPY WITH PROPOFOL;  Surgeon: Robert Bellow, MD;  Location: [facility] ENDOSCOPY;  Service: Endoscopy;  Laterality: N/A;  HAS A PORT, NEEDS AMPICILLIN PER OFFICE   DILATION AND CURETTAGE OF UTERUS     ENDOMETRIAL ABLATION  12/1991   ganglion cyst removal      IR IMAGING GUIDED PORT INSERTION  10/27/2020   IR IMAGING GUIDED PORT INSERTION  04/04/2021   KYPHOPLASTY N/A 10/28/2020   Procedure: T12 and L3 KYPHOPLASTY;  Surgeon: Hessie Knows, MD;  Location: [facility] ORS;  Service: Orthopedics;  Laterality: N/A;   TONSILLECTOMY     WRIST SURGERY Right 05/1995    FAMILY HISTORY: Family History  Problem Relation Age of Onset   Colon polyps Sister    Colon cancer Father 37   Diabetes Mother    Breast cancer Neg  Hx     ADVANCED DIRECTIVES (Y/N):  N  HEALTH MAINTENANCE: Social History   Tobacco Use   Smoking status: Former    Packs/day: 1.00    Years: 4.00    Total pack years: 4.00    Types: Cigarettes    Quit date: 1982    Years since quitting: 42.1   Smokeless tobacco: Never  Vaping Use   Vaping Use: Never used  Substance Use Topics   Alcohol use: Yes     Alcohol/week: 1.0 - 2.0 standard drink of alcohol    Types: 1 - 2 Glasses of wine per week    Comment: occassionally   Drug use: No     Colonoscopy:  PAP:  Bone density:  Lipid panel:  No Known Allergies  Current Outpatient Medications  Medication Sig Dispense Refill   acetaminophen (TYLENOL) 325 MG tablet Take 650 mg by mouth 2 (two) times a week. Premed for velcade. Taking Tuesdays and Friday     acyclovir (ZOVIRAX) 400 MG tablet TAKE 1 TABLET BY MOUTH TWICE DAILY 60 tablet 5   ALPRAZolam (XANAX) 0.25 MG tablet TAKE 1 TABLET BY MOUTH AT BEDTIME AS NEEDED FOR SLEEP 30 tablet 2   ASPIRIN 81 PO Take 81 mg by mouth daily.     atorvastatin (LIPITOR) 20 MG tablet Take 20 mg by mouth daily.     busPIRone (BUSPAR) 30 MG tablet Take 30 mg by mouth 2 (two) times daily.     CALCIUM-VITAMIN D PO Take by mouth 2 (two) times daily.     cyclobenzaprine (FLEXERIL) 10 MG tablet TAKE 1 TABLET BY MOUTH AT BEDTIME 30 tablet 2   dexamethasone (DECADRON) 4 MG tablet TAKE 5 TABLETS BY MOUTH ONCE A WEEK 20 tablet 3   diphenhydrAMINE (BENADRYL) 25 mg capsule Take 25 mg by mouth 2 (two) times a week. Pre med for velcade. Only Tuesdays and Fridays     diphenoxylate-atropine (LOMOTIL) 2.5-0.025 MG tablet Take by mouth as needed.     gabapentin (NEURONTIN) 300 MG capsule TAKE 1 CAPSULE BY MOUTH TWICE DAILY 60 capsule 2   ivabradine (CORLANOR) 5 MG TABS tablet Take 5 mg by mouth 2 (two) times daily with a meal.     lenalidomide (REVLIMID) 10 MG capsule TAKE 1 CAPSULE BY MOUTH 1 TIME A DAY FOR 21 DAYS ON THEN 7 DAYS OFF 21 capsule 0   lidocaine-prilocaine (EMLA) cream APPLY 1 APPLICATION TOPICALLY AS NEEDED 30 g 2   liraglutide (VICTOZA) 18 MG/3ML SOPN Inject 1.2 mg into the skin daily.     Magnesium 400 MG TABS Take 1 tablet by mouth daily.     meloxicam (MOBIC) 15 MG tablet Take 15 mg by mouth daily.     methocarbamol (ROBAXIN) 500 MG tablet Take 1 tablet (500 mg total) by mouth every 6 (six) hours as needed  for muscle spasms. 40 tablet 1   Multiple Vitamin (MULTI-VITAMIN) tablet Take 1 tablet by mouth daily.     omeprazole (PRILOSEC) 40 MG capsule Take 40 mg by mouth daily.     ondansetron (ZOFRAN) 8 MG tablet Take 1 tablet (8 mg total) by mouth 2 (two) times daily as needed (Nausea or vomiting). 60 tablet 2   oxyCODONE-acetaminophen (PERCOCET/ROXICET) 5-325 MG tablet TAKE 1-2 TABLETS BY MOUTH EVERY 4 HOURS AS NEEDED FOR SEVERE PAIN 90 tablet 0   prochlorperazine (COMPAZINE) 10 MG tablet TAKE 1 TABLET BY MOUTH EVERY 6 HOURS AS NEEDED FOR NAUSEA OR VOMITING 60 tablet 2  traMADol (ULTRAM) 50 MG tablet Take 1 tablet (50 mg total) by mouth every 6 (six) hours as needed. Take 1 to 2 tablets (100 mg total) as needed for pain. 120 tablet 2   No current facility-administered medications for this visit.   Facility-Administered Medications Ordered in Other Visits  Medication Dose Route Frequency Provider Last Rate Last Admin   0.9 %  sodium chloride infusion   Intravenous Continuous Lloyd Huger, MD   Stopped at 04/07/21 1142   sodium chloride flush (NS) 0.9 % injection 10 mL  10 mL Intravenous PRN Lloyd Huger, MD   10 mL at 11/23/20 0932   sodium chloride flush (NS) 0.9 % injection 10 mL  10 mL Intravenous PRN Lloyd Huger, MD   10 mL at 04/18/22 1417    OBJECTIVE: Vitals:   04/25/22 1305  BP: (!) 136/54  Pulse: 80  Resp: 16  Temp: 98.2 F (36.8 C)  SpO2: 100%     Body mass index is 25.22 kg/m.    ECOG FS:1 - Symptomatic but completely ambulatory  General: Well-developed, well-nourished, no acute distress. Eyes: Pink conjunctiva, anicteric sclera. HEENT: Normocephalic, moist mucous membranes. Lungs: No audible wheezing or coughing. Heart: Regular rate and rhythm. Abdomen: Soft, nontender, no obvious distention. Musculoskeletal: No edema, cyanosis, or clubbing. Neuro: Alert, answering all questions appropriately. Cranial nerves grossly intact. Skin: No rashes or  petechiae noted. Psych: Normal affect.   LAB RESULTS:  Lab Results  Component Value Date   NA 136 04/25/2022   K 3.5 04/25/2022   CL 106 04/25/2022   CO2 23 04/25/2022   GLUCOSE 108 (H) 04/25/2022   BUN 16 04/25/2022   CREATININE 1.33 (H) 04/25/2022   CALCIUM 8.8 (L) 04/25/2022   PROT 6.3 (L) 04/25/2022   ALBUMIN 3.8 04/25/2022   AST 24 04/25/2022   ALT 16 04/25/2022   ALKPHOS 86 04/25/2022   BILITOT 0.4 04/25/2022   GFRNONAA 44 (L) 04/25/2022    Lab Results  Component Value Date   WBC 3.5 (L) 04/25/2022   NEUTROABS 2.1 04/25/2022   HGB 9.6 (L) 04/25/2022   HCT 29.7 (L) 04/25/2022   MCV 102.1 (H) 04/25/2022   PLT 60 (L) 04/25/2022     STUDIES: No results found.  ONCOLOGY HISTORY:  Diagnosis confirmed from bone biopsy on August 17, 2020.  Initially her lambda free light chains were elevated at 432.1, but now are within normal limits at 12.1.  Immunoglobulins and SPEP are normal.  Bone marrow biopsy completed on August 26, 2020 revealed 25% plasma cells along with the deletion of 1p which is considered poor prognosis.  PET scan results from September 06, 2020 reviewed independently with 3 hypermetabolic lesions in right C5-6 facets, right iliac crest lesion, and L3 vertebral lesion.  After lengthy discussion with the patient, she agreed to pursue chemotherapy with daratumumab, Velcade, Revlimid, and dexamethasone followed by autologous bone marrow transplant.  Patient received weekly daratumumab for 4 cycles along with Velcade on days 1, 4, 8, and 11.  She also received Revlimid on days 1 through 14 of a 21-day cycle.  She completed cycle 4 of treatment on January 14, 2021.  Subsequent bone marrow biopsy on January 24, 2021 revealed complete remission with no evidence of myeloma.  Patient underwent autologous stem cell [facility] at [facility] on February 24, 2021.  She was readmitted to the hospital on March 04, 2021 with neutropenic fever, diarrhea, and sepsis-like symptoms.   Infectious disease work-up was negative.  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  PLAN:      Lambda light chain myeloma with 1p del:  Patient now using consolidation treatment with the GRIFFIN regimen.  She received consolidation with cycle 5 and 6 using daratumumab on day 1, Velcade on day 1, 4, 8, and 11, Revlimid 10 mg on days 1 through 14 with 7 days off, and weekly dexamethasone on a 21-day cycle.  She is now on maintenance daratumumab on day 1 and Revlimid 10 mg on days 1 through 21 on a 28-day cycle for cycles 7 through 32.  Repeat bone marrow biopsy on October 03, 2021 revealed no evidence of a myeloid neoplasm.  Repeat PET scan on February 16, 2022 reviewed independently with new bony hypermetabolism favoring sequelae of chronic compression fractures rather than progression of disease. Can consider repeating in June 2024.  Proceed with cycle 15 of treatment today.  Patient takes all of her premedications at home prior to clinic, therefore these have been removed from the treatment plan.  Return to clinic in 4 weeks for further evaluation and consideration of cycle 16. Pain: Well-controlled.  She has completed XRT.  She had kyphoplasty x2.  Continue tramadol.  Patient only uses Percocet sparingly and is given a refill today.  Repeat MRI results with essentially no change.  PET scan results as above.  Appreciate orthopedics input who recommended conservative management.   Hypocalcemia: Mild.  Proceed with Zometa. Renal insufficiency: Creatinine mildly improved to 1.33. Anemia: Chronic and unchanged.  Patient's hemoglobin is 9.6.  Can consider a dose reduction of Revlimid in the future if necessary. Thrombocytopenia: Chronic and unchanged.  Patient's platelet count is 60.   Neutropenia: Resolved. Vaccinations: Continue revaccination schedule as per [facility]. Pulmonary nodule: Patient noted to have a 9 mm hypermetabolic pulmonary nodule in her left lower  lobe lung.  Possibly infectious, continue to monitor closely. Cough/congestion: Solved with Levaquin. Leg pain: Monitor.  Patient expressed understanding and was in agreement with this plan. She also understands that She can call clinic at any time with any questions, concerns, or complaints.      Cancer Staging  Lambda light chain myeloma (Hannah Mcdonald) Staging form: Plasma Cell Myeloma and Plasma Cell Disorders, AJCC 8th Edition - Clinical stage from 09/16/2020: RISS Stage I (Beta-2-microglobulin (mg/L): 2.5, Albumin (g/dL): 4.9, ISS: Stage I, High-risk cytogenetics: Absent, LDH: Normal) - Signed by Lloyd Huger, MD on 09/16/2020 Beta 2 microglobulin range (mg/L): Less than 3.5 Albumin range (g/dL): Greater than or equal to 3.5 Cytogenetics: 1p deletion  Lloyd Huger, MD   04/26/2022 10:13 AM

## 2022-04-25 NOTE — Progress Notes (Signed)
Has pain in back of left knee that started Sunday. Was on Levaquin and it has a side effect of ruptured tendon. Has had more dizziness this week.

## 2022-04-26 ENCOUNTER — Encounter: Payer: Self-pay | Admitting: Oncology

## 2022-04-26 LAB — KAPPA/LAMBDA LIGHT CHAINS
Kappa free light chain: 7.7 mg/L (ref 3.3–19.4)
Kappa, lambda light chain ratio: 1.88 — ABNORMAL HIGH (ref 0.26–1.65)
Lambda free light chains: 4.1 mg/L — ABNORMAL LOW (ref 5.7–26.3)

## 2022-04-27 ENCOUNTER — Other Ambulatory Visit: Payer: Self-pay | Admitting: Oncology

## 2022-04-27 DIAGNOSIS — C9000 Multiple myeloma not having achieved remission: Secondary | ICD-10-CM

## 2022-05-01 ENCOUNTER — Encounter: Payer: Self-pay | Admitting: Oncology

## 2022-05-02 ENCOUNTER — Other Ambulatory Visit: Payer: Self-pay | Admitting: *Deleted

## 2022-05-02 DIAGNOSIS — M25562 Pain in left knee: Secondary | ICD-10-CM

## 2022-05-08 ENCOUNTER — Encounter: Payer: Self-pay | Admitting: Oncology

## 2022-05-11 ENCOUNTER — Other Ambulatory Visit: Payer: Self-pay | Admitting: *Deleted

## 2022-05-11 DIAGNOSIS — M25562 Pain in left knee: Secondary | ICD-10-CM

## 2022-05-16 ENCOUNTER — Ambulatory Visit: Payer: Medicare PPO | Admitting: Oncology

## 2022-05-16 ENCOUNTER — Other Ambulatory Visit: Payer: Medicare PPO

## 2022-05-16 ENCOUNTER — Ambulatory Visit: Admission: RE | Admit: 2022-05-16 | Payer: Medicare PPO | Source: Ambulatory Visit

## 2022-05-16 ENCOUNTER — Ambulatory Visit: Payer: Medicare PPO

## 2022-05-17 ENCOUNTER — Ambulatory Visit: Payer: Medicare PPO | Admitting: Dermatology

## 2022-05-23 ENCOUNTER — Encounter: Payer: Self-pay | Admitting: Oncology

## 2022-05-23 ENCOUNTER — Inpatient Hospital Stay: Payer: Medicare PPO | Attending: Oncology | Admitting: Oncology

## 2022-05-23 ENCOUNTER — Inpatient Hospital Stay: Payer: Medicare PPO

## 2022-05-23 VITALS — BP 136/49 | HR 79 | Resp 17

## 2022-05-23 DIAGNOSIS — D649 Anemia, unspecified: Secondary | ICD-10-CM | POA: Insufficient documentation

## 2022-05-23 DIAGNOSIS — C9001 Multiple myeloma in remission: Secondary | ICD-10-CM | POA: Diagnosis present

## 2022-05-23 DIAGNOSIS — N289 Disorder of kidney and ureter, unspecified: Secondary | ICD-10-CM | POA: Insufficient documentation

## 2022-05-23 DIAGNOSIS — Z5112 Encounter for antineoplastic immunotherapy: Secondary | ICD-10-CM | POA: Diagnosis present

## 2022-05-23 DIAGNOSIS — Z79899 Other long term (current) drug therapy: Secondary | ICD-10-CM | POA: Diagnosis not present

## 2022-05-23 DIAGNOSIS — D696 Thrombocytopenia, unspecified: Secondary | ICD-10-CM | POA: Insufficient documentation

## 2022-05-23 DIAGNOSIS — Z923 Personal history of irradiation: Secondary | ICD-10-CM | POA: Insufficient documentation

## 2022-05-23 DIAGNOSIS — C9000 Multiple myeloma not having achieved remission: Secondary | ICD-10-CM

## 2022-05-23 DIAGNOSIS — Z5111 Encounter for antineoplastic chemotherapy: Secondary | ICD-10-CM | POA: Diagnosis not present

## 2022-05-23 DIAGNOSIS — R911 Solitary pulmonary nodule: Secondary | ICD-10-CM | POA: Diagnosis not present

## 2022-05-23 LAB — COMPREHENSIVE METABOLIC PANEL
ALT: 15 U/L (ref 0–44)
AST: 34 U/L (ref 15–41)
Albumin: 3.6 g/dL (ref 3.5–5.0)
Alkaline Phosphatase: 86 U/L (ref 38–126)
Anion gap: 8 (ref 5–15)
BUN: 18 mg/dL (ref 8–23)
CO2: 21 mmol/L — ABNORMAL LOW (ref 22–32)
Calcium: 8.5 mg/dL — ABNORMAL LOW (ref 8.9–10.3)
Chloride: 106 mmol/L (ref 98–111)
Creatinine, Ser: 1.41 mg/dL — ABNORMAL HIGH (ref 0.44–1.00)
GFR, Estimated: 41 mL/min — ABNORMAL LOW (ref >60)
Glucose, Bld: 165 mg/dL — ABNORMAL HIGH (ref 70–99)
Potassium: 3.6 mmol/L (ref 3.5–5.1)
Sodium: 135 mmol/L (ref 135–145)
Total Bilirubin: 0.4 mg/dL (ref 0.3–1.2)
Total Protein: 6.3 g/dL — ABNORMAL LOW (ref 6.5–8.1)

## 2022-05-23 LAB — CBC WITH DIFFERENTIAL/PLATELET
Abs Immature Granulocytes: 0.01 10*3/uL (ref 0.00–0.07)
Basophils Absolute: 0 10*3/uL (ref 0.0–0.1)
Basophils Relative: 1 %
Eosinophils Absolute: 0.1 10*3/uL (ref 0.0–0.5)
Eosinophils Relative: 2 %
HCT: 26.1 % — ABNORMAL LOW (ref 36.0–46.0)
Hemoglobin: 8.4 g/dL — ABNORMAL LOW (ref 12.0–15.0)
Immature Granulocytes: 1 %
Lymphocytes Relative: 24 %
Lymphs Abs: 0.5 10*3/uL — ABNORMAL LOW (ref 0.7–4.0)
MCH: 33.3 pg (ref 26.0–34.0)
MCHC: 32.2 g/dL (ref 30.0–36.0)
MCV: 103.6 fL — ABNORMAL HIGH (ref 80.0–100.0)
Monocytes Absolute: 0.2 10*3/uL (ref 0.1–1.0)
Monocytes Relative: 9 %
Neutro Abs: 1.4 10*3/uL — ABNORMAL LOW (ref 1.7–7.7)
Neutrophils Relative %: 63 %
Platelets: 52 10*3/uL — ABNORMAL LOW (ref 150–400)
RBC: 2.52 MIL/uL — ABNORMAL LOW (ref 3.87–5.11)
RDW: 15.3 % (ref 11.5–15.5)
WBC: 2.2 10*3/uL — ABNORMAL LOW (ref 4.0–10.5)
nRBC: 0 % (ref 0.0–0.2)

## 2022-05-23 MED ORDER — HEPARIN SOD (PORK) LOCK FLUSH 100 UNIT/ML IV SOLN
500.0000 [IU] | Freq: Once | INTRAVENOUS | Status: DC
  Filled 2022-05-23: qty 5

## 2022-05-23 MED ORDER — HEPARIN SOD (PORK) LOCK FLUSH 100 UNIT/ML IV SOLN
INTRAVENOUS | Status: AC
  Administered 2022-05-23: 500 [IU]
  Filled 2022-05-23: qty 5

## 2022-05-23 MED ORDER — ZOLEDRONIC ACID 4 MG/5ML IV CONC
3.3000 mg | Freq: Once | INTRAVENOUS | Status: AC
  Administered 2022-05-23: 3.3 mg via INTRAVENOUS
  Filled 2022-05-23: qty 4.13

## 2022-05-23 MED ORDER — SODIUM CHLORIDE 0.9 % IV SOLN
Freq: Once | INTRAVENOUS | Status: AC
  Filled 2022-05-23: qty 250

## 2022-05-23 MED ORDER — DARATUMUMAB-HYALURONIDASE-FIHJ 1800-30000 MG-UT/15ML ~~LOC~~ SOLN
1800.0000 mg | Freq: Once | SUBCUTANEOUS | Status: AC
  Administered 2022-05-23: 1800 mg via SUBCUTANEOUS
  Filled 2022-05-23: qty 15

## 2022-05-23 MED ORDER — ACETAMINOPHEN 325 MG PO TABS: 650.0000 mg | ORAL_TABLET | Freq: Once | ORAL | Status: DC

## 2022-05-23 MED ORDER — SODIUM CHLORIDE 0.9% FLUSH
10.0000 mL | INTRAVENOUS | Status: DC | PRN
  Administered 2022-05-23: 10 mL via INTRAVENOUS
  Filled 2022-05-23: qty 10

## 2022-05-23 MED ORDER — DIPHENHYDRAMINE HCL 25 MG PO CAPS: 25.0000 mg | ORAL_CAPSULE | Freq: Once | ORAL | Status: DC

## 2022-05-23 NOTE — Patient Instructions (Signed)
[facility]  Discharge Instructions: Thank you for choosing [facility] to provide your oncology and hematology care.  If you have a lab appointment with the [facility], please go directly to the [facility] and check in at the registration area.  Wear comfortable clothing and clothing appropriate for easy access to any Portacath or PICC line.   We strive to give you quality time with your provider. You may need to reschedule your appointment if you arrive late (15 or more minutes).  Arriving late affects you and other patients whose appointments are after yours.  Also, if you miss three or more appointments without notifying the office, you may be dismissed from the clinic at the provider's discretion.      For prescription refill requests, have your pharmacy contact our office and allow 72 hours for refills to be completed.    Today you received the following chemotherapy and/or immunotherapy agents Darzalex      To help prevent nausea and vomiting after your treatment, we encourage you to take your nausea medication as directed.  BELOW ARE SYMPTOMS THAT SHOULD BE REPORTED IMMEDIATELY: *FEVER GREATER THAN 100.4 F (38 C) OR HIGHER *CHILLS OR SWEATING *NAUSEA AND VOMITING THAT IS NOT CONTROLLED WITH YOUR NAUSEA MEDICATION *UNUSUAL SHORTNESS OF BREATH *UNUSUAL BRUISING OR BLEEDING *URINARY PROBLEMS (pain or burning when urinating, or frequent urination) *BOWEL PROBLEMS (unusual diarrhea, constipation, pain near the anus) TENDERNESS IN MOUTH AND THROAT WITH OR WITHOUT PRESENCE OF ULCERS (sore throat, sores in mouth, or a toothache) UNUSUAL RASH, SWELLING OR PAIN  UNUSUAL VAGINAL DISCHARGE OR ITCHING   Items with * indicate a potential emergency and should be followed up as soon as possible or go to the Emergency Department if any problems should occur.  Please show the CHEMOTHERAPY ALERT CARD or IMMUNOTHERAPY ALERT CARD at check-in to  the Emergency Department and triage nurse.  Should you have questions after your visit or need to cancel or reschedule your appointment, please contact [facility]  [phone] and follow the prompts.  Office hours are 8:00 a.m. to 4:30 p.m. Monday - Friday. Please note that voicemails left after 4:00 p.m. may not be returned until the following business day.  We are closed weekends and major holidays. You have access to a nurse at all times for urgent questions. Please call the main number to the clinic [phone] and follow the prompts.  For any non-urgent questions, you may also contact your provider using MyChart. We now offer e-Visits for anyone 2 and older to request care online for non-urgent symptoms. For details visit [email or web address].   Also download the MyChart app! Go to the app store, search "MyChart", open the app, select [facility], and log in with your MyChart username and password.   Zoledronic Acid Injection (Cancer) What is this medication? ZOLEDRONIC ACID (ZOE le dron ik AS id) treats high calcium levels in the blood caused by cancer. It may also be used with chemotherapy to treat weakened bones caused by cancer. It works by slowing down the release of calcium from bones. This lowers calcium levels in your blood. It also makes your bones stronger and less likely to break (fracture). It belongs to a group of medications called bisphosphonates. This medicine may be used for other purposes; ask your health care provider or pharmacist if you have questions. COMMON BRAND NAME(S): Zometa, Zometa Powder What should I tell my care team before I  take this medication? They need to know if you have any of these conditions: Dehydration Dental disease Kidney disease Liver disease Low levels of calcium in the blood Lung or breathing disease, such as asthma Receiving steroids, such as dexamethasone or prednisone An unusual or allergic reaction  to zoledronic acid, other medications, foods, dyes, or preservatives Pregnant or trying to get pregnant Breast-feeding How should I use this medication? This medication is injected into a vein. It is given by your care team in a hospital or clinic setting. Talk to your care team about the use of this medication in children. Special care may be needed. Overdosage: If you think you have taken too much of this medicine contact a poison control center or emergency room at once. NOTE: This medicine is only for you. Do not share this medicine with others. What if I miss a dose? Keep appointments for follow-up doses. It is important not to miss your dose. Call your care team if you are unable to keep an appointment. What may interact with this medication? Certain antibiotics given by injection Diuretics, such as bumetanide, furosemide NSAIDs, medications for pain and inflammation, such as ibuprofen or naproxen Teriparatide Thalidomide This list may not describe all possible interactions. Give your health care provider a list of all the medicines, herbs, non-prescription drugs, or dietary supplements you use. Also tell them if you smoke, drink alcohol, or use illegal drugs. Some items may interact with your medicine. What should I watch for while using this medication? Visit your care team for regular checks on your progress. It may be some time before you see the benefit from this medication. Some people who take this medication have severe bone, joint, or muscle pain. This medication may also increase your risk for jaw problems or a broken thigh bone. Tell your care team right away if you have severe pain in your jaw, bones, joints, or muscles. Tell you care team if you have any pain that does not go away or that gets worse. Tell your dentist and dental surgeon that you are taking this medication. You should not have major dental surgery while on this medication. See your dentist to have a dental exam  and fix any dental problems before starting this medication. Take good care of your teeth while on this medication. Make sure you see your dentist for regular follow-up appointments. You should make sure you get enough calcium and vitamin D while you are taking this medication. Discuss the foods you eat and the vitamins you take with your care team. Check with your care team if you have severe diarrhea, nausea, and vomiting, or if you sweat a lot. The loss of too much body fluid may make it dangerous for you to take this medication. You may need bloodwork while taking this medication. Talk to your care team if you wish to become pregnant or think you might be pregnant. This medication can cause serious birth defects. What side effects may I notice from receiving this medication? Side effects that you should report to your care team as soon as possible: Allergic reactions--skin rash, itching, hives, swelling of the face, lips, tongue, or throat Kidney injury--decrease in the amount of urine, swelling of the ankles, hands, or feet Low calcium level--muscle pain or cramps, confusion, tingling, or numbness in the hands or feet Osteonecrosis of the jaw--pain, swelling, or redness in the mouth, numbness of the jaw, poor healing after dental work, unusual discharge from the mouth, visible bones in the  mouth Severe bone, joint, or muscle pain Side effects that usually do not require medical attention (report to your care team if they continue or are bothersome): Constipation Fatigue Fever Loss of appetite Nausea Stomach pain This list may not describe all possible side effects. Call your doctor for medical advice about side effects. You may report side effects to FDA at 1-800-FDA-1088. Where should I keep my medication? This medication is given in a hospital or clinic. It will not be stored at home. NOTE: This sheet is a summary. It may not cover all possible information. If you have questions about  this medicine, talk to your doctor, pharmacist, or health care provider.  2023 Elsevier/Gold Standard (2007-04-06 00:00:00)

## 2022-05-23 NOTE — Progress Notes (Signed)
Had COVID 2 weeks ago. Still having SOB.

## 2022-05-23 NOTE — Progress Notes (Signed)
Pt reports taking Steroids, Tylenol and Benadryl prior to coming to clinic per Dr. Grayland Ormond okay to hold tylenol and Benadryl and proceed with Darzalex.  Per Dr. Grayland Ormond pt does not need to be monitored post Darzalex.  Calcium 8.5 and Creatinine 1.41, Per Dr. Grayland Ormond okay to proceed with Zometa.

## 2022-05-23 NOTE — Progress Notes (Signed)
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  INTERVAL HISTORY: Patient returns to clinic today for further evaluation and consideration of cycle 16 of maintenance daratumumab, Revlimid, and Zometa.  She had COVID infection several weeks ago and now has tested negative, but continues to have residual cough and shortness of breath.  She otherwise feels well.  She continues to have back pain, but this is well-controlled on her current dose of tramadol.  The peripheral neuropathy in her feet is essentially unchanged.  She has no other neurologic complaints.  She denies any chest pain or hemoptysis.  She denies any nausea, vomiting, constipation, or diarrhea.  She has no urinary complaints.  Patient offers no further specific complaints today.  REVIEW OF SYSTEMS:   Review of Systems  Constitutional: Negative.  Negative for fever, malaise/fatigue and weight loss.  HENT: Negative.  Negative for congestion and sinus pain.   Respiratory:  Positive for cough and shortness of breath. Negative for hemoptysis.   Cardiovascular:  Negative for chest pain and leg swelling.  Gastrointestinal: Negative.  Negative for abdominal pain, diarrhea and nausea.  Genitourinary: Negative.  Negative for dysuria.  Musculoskeletal:  Positive for back pain. Negative for joint pain and neck pain.  Skin: Negative.  Negative for rash.  Neurological:  Positive for sensory change. Negative for dizziness, tremors, focal weakness, weakness and headaches.  Psychiatric/Behavioral: Negative.  The patient is not nervous/anxious.     As per HPI. Otherwise, a complete review of systems is negative.  PAST MEDICAL  HISTORY: Past Medical History:  Diagnosis Date   Anxiety    Diabetes mellitus without complication ([place]) Q000111Q   GERD (gastroesophageal reflux disease)    Hyperlipidemia    Myeloma ([place])    Myeloma ([place])    Personal history of colonic polyps    Squamous cell carcinoma of skin 11/25/2013   Left chest. WD SCC with superficial infiltration.   Tachycardia     PAST SURGICAL HISTORY: Past Surgical History:  Procedure Laterality Date   AUGMENTATION MAMMAPLASTY Bilateral Q000111Q   silicone and replacement in 2001   [place], [place], 1998,2003, 2008, 2013   COLONOSCOPY WITH PROPOFOL N/A 08/16/2016   Procedure: COLONOSCOPY WITH PROPOFOL;  Surgeon: Robert Bellow, MD;  Location: [facility] ENDOSCOPY;  Service: Endoscopy;  Laterality: N/A;   COLONOSCOPY WITH PROPOFOL N/A 10/05/2021   Procedure: COLONOSCOPY WITH PROPOFOL;  Surgeon: Robert Bellow, MD;  Location: [facility] ENDOSCOPY;  Service: Endoscopy;  Laterality: N/A;  HAS A PORT, NEEDS AMPICILLIN PER OFFICE   DILATION AND CURETTAGE OF UTERUS     ENDOMETRIAL ABLATION  12/1991   ganglion cyst removal      IR IMAGING GUIDED PORT INSERTION  10/27/2020   IR IMAGING GUIDED PORT INSERTION  04/04/2021   KYPHOPLASTY N/A 10/28/2020   Procedure: T12 and L3 KYPHOPLASTY;  Surgeon: Hessie Knows, MD;  Location: [facility] ORS;  Service: Orthopedics;  Laterality: N/A;   TONSILLECTOMY     WRIST SURGERY Right 05/1995    FAMILY HISTORY: Family History  Problem Relation Age of Onset   Colon polyps Sister    Colon cancer Father 86   Diabetes Mother  Breast cancer Neg Hx     ADVANCED DIRECTIVES (Y/N):  N  HEALTH MAINTENANCE: Social History   Tobacco Use   Smoking status: Former    Packs/day: 1.00    Years: 4.00    Additional pack years: 0.00    Total pack years: 4.00    Types: Cigarettes    Quit date: 18    Years since quitting: 42.2   Smokeless tobacco: Never  Vaping Use   Vaping  Use: Never used  Substance Use Topics   Alcohol use: Yes    Alcohol/week: 1.0 - 2.0 standard drink of alcohol    Types: 1 - 2 Glasses of wine per week    Comment: occassionally   Drug use: No     Colonoscopy:  PAP:  Bone density:  Lipid panel:  No Known Allergies  Current Outpatient Medications  Medication Sig Dispense Refill   acetaminophen (TYLENOL) 325 MG tablet Take 650 mg by mouth 2 (two) times a week. Premed for velcade. Taking Tuesdays and Friday     acyclovir (ZOVIRAX) 400 MG tablet TAKE 1 TABLET BY MOUTH TWICE DAILY 60 tablet 5   albuterol (VENTOLIN HFA) 108 (90 Base) MCG/ACT inhaler Inhale 2 puffs into the lungs every 4 (four) hours as needed.     ALPRAZolam (XANAX) 0.25 MG tablet TAKE 1 TABLET BY MOUTH AT BEDTIME AS NEEDED FOR SLEEP 30 tablet 2   ASPIRIN 81 PO Take 81 mg by mouth daily.     atorvastatin (LIPITOR) 20 MG tablet Take 20 mg by mouth daily.     benzonatate (TESSALON) 200 MG capsule Take 200 mg by mouth 3 (three) times daily as needed.     busPIRone (BUSPAR) 30 MG tablet Take 30 mg by mouth 2 (two) times daily.     CALCIUM-VITAMIN D PO Take by mouth 2 (two) times daily.     cyclobenzaprine (FLEXERIL) 10 MG tablet TAKE 1 TABLET BY MOUTH AT BEDTIME 30 tablet 2   dexamethasone (DECADRON) 4 MG tablet TAKE 5 TABLETS BY MOUTH ONCE A WEEK 20 tablet 3   diphenhydrAMINE (BENADRYL) 25 mg capsule Take 25 mg by mouth 2 (two) times a week. Pre med for velcade. Only Tuesdays and Fridays     diphenoxylate-atropine (LOMOTIL) 2.5-0.025 MG tablet Take by mouth as needed.     gabapentin (NEURONTIN) 300 MG capsule TAKE 1 CAPSULE BY MOUTH TWICE DAILY 60 capsule 2   ivabradine (CORLANOR) 5 MG TABS tablet Take 5 mg by mouth 2 (two) times daily with a meal.     lidocaine-prilocaine (EMLA) cream APPLY 1 APPLICATION TOPICALLY AS NEEDED 30 g 2   liraglutide (VICTOZA) 18 MG/3ML SOPN Inject 1.2 mg into the skin daily.     Magnesium 400 MG TABS Take 1 tablet by mouth daily.     meloxicam  (MOBIC) 15 MG tablet Take 15 mg by mouth daily.     Multiple Vitamin (MULTI-VITAMIN) tablet Take 1 tablet by mouth daily.     omeprazole (PRILOSEC) 40 MG capsule Take 40 mg by mouth daily.     ondansetron (ZOFRAN) 8 MG tablet Take 1 tablet (8 mg total) by mouth 2 (two) times daily as needed (Nausea or vomiting). 60 tablet 2   oxyCODONE-acetaminophen (PERCOCET/ROXICET) 5-325 MG tablet TAKE 1-2 TABLETS BY MOUTH EVERY 4 HOURS AS NEEDED FOR SEVERE PAIN 90 tablet 0   prochlorperazine (COMPAZINE) 10 MG tablet TAKE 1 TABLET BY MOUTH EVERY 6 HOURS AS NEEDED FOR NAUSEA OR VOMITING 60 tablet 2  REVLIMID 10 MG capsule TAKE 1 CAPSULE BY MOUTH 1 TIME A DAY FOR 21 DAYS ON THEN 7 DAYS OFF 21 capsule 0   traMADol (ULTRAM) 50 MG tablet Take 1 tablet (50 mg total) by mouth every 6 (six) hours as needed. Take 1 to 2 tablets (100 mg total) as needed for pain. 120 tablet 2   methocarbamol (ROBAXIN) 500 MG tablet Take 1 tablet (500 mg total) by mouth every 6 (six) hours as needed for muscle spasms. (Patient not taking: Reported on 05/23/2022) 40 tablet 1   No current facility-administered medications for this visit.   Facility-Administered Medications Ordered in Other Visits  Medication Dose Route Frequency Provider Last Rate Last Admin   0.9 %  sodium chloride infusion   Intravenous Continuous Lloyd Huger, MD   Stopped at 04/07/21 1142   0.9 %  sodium chloride infusion   Intravenous Once Lloyd Huger, MD       acetaminophen (TYLENOL) tablet 650 mg  650 mg Oral Once Lloyd Huger, MD       daratumumab-hyaluronidase-fihj [facility] FASPRO) 1800-30000 MG-UT/15ML chemo SQ injection 1,800 mg  1,800 mg Subcutaneous Once Lloyd Huger, MD       diphenhydrAMINE (BENADRYL) capsule 25 mg  25 mg Oral Once Lloyd Huger, MD       sodium chloride flush (NS) 0.9 % injection 10 mL  10 mL Intravenous PRN Lloyd Huger, MD   10 mL at 11/23/20 0932   sodium chloride flush (NS) 0.9 % injection 10  mL  10 mL Intravenous PRN Lloyd Huger, MD   10 mL at 04/18/22 1417   zoledronic acid (ZOMETA) 3.3 mg in sodium chloride 0.9 % 100 mL IVPB  3.3 mg Intravenous Once Lloyd Huger, MD        OBJECTIVE: Vitals:   05/23/22 0908  BP: (!) 121/57  Pulse: 74  Resp: 16  Temp: 98.3 F (36.8 C)  SpO2: 100%     Body mass index is 24.37 kg/m.    ECOG FS:1 - Symptomatic but completely ambulatory  General: Well-developed, well-nourished, no acute distress. Eyes: Pink conjunctiva, anicteric sclera. HEENT: Normocephalic, moist mucous membranes. Lungs: No audible wheezing or coughing. Heart: Regular rate and rhythm. Abdomen: Soft, nontender, no obvious distention. Musculoskeletal: No edema, cyanosis, or clubbing. Neuro: Alert, answering all questions appropriately. Cranial nerves grossly intact. Skin: No rashes or petechiae noted. Psych: Normal affect.  LAB RESULTS:  Lab Results  Component Value Date   NA 135 05/23/2022   K 3.6 05/23/2022   CL 106 05/23/2022   CO2 21 (L) 05/23/2022   GLUCOSE 165 (H) 05/23/2022   BUN 18 05/23/2022   CREATININE 1.41 (H) 05/23/2022   CALCIUM 8.5 (L) 05/23/2022   PROT 6.3 (L) 05/23/2022   ALBUMIN 3.6 05/23/2022   AST 34 05/23/2022   ALT 15 05/23/2022   ALKPHOS 86 05/23/2022   BILITOT 0.4 05/23/2022   GFRNONAA 41 (L) 05/23/2022    Lab Results  Component Value Date   WBC 2.2 (L) 05/23/2022   NEUTROABS 1.4 (L) 05/23/2022   HGB 8.4 (L) 05/23/2022   HCT 26.1 (L) 05/23/2022   MCV 103.6 (H) 05/23/2022   PLT 52 (L) 05/23/2022     STUDIES: No results found.  ONCOLOGY HISTORY:  Diagnosis confirmed from bone biopsy on August 17, 2020.  Initially her lambda free light chains were elevated at 432.1, but now are within normal limits at 12.1.  Immunoglobulins and SPEP are normal.  Bone marrow biopsy completed on August 26, 2020 revealed 25% plasma cells along with the deletion of 1p which is considered poor prognosis.  PET scan results from September 06, 2020 reviewed independently with 3 hypermetabolic lesions in right C5-6 facets, right iliac crest lesion, and L3 vertebral lesion.  After lengthy discussion with the patient, she agreed to pursue chemotherapy with daratumumab, Velcade, Revlimid, and dexamethasone followed by autologous bone marrow transplant.  Patient received weekly daratumumab for 4 cycles along with Velcade on days 1, 4, 8, and 11.  She also received Revlimid on days 1 through 14 of a 21-day cycle.  She completed cycle 4 of treatment on January 14, 2021.  Subsequent bone marrow biopsy on January 24, 2021 revealed complete remission with no evidence of myeloma.  Patient underwent autologous [facility] on February 24, 2021.  She was readmitted to the hospital on March 04, 2021 with neutropenic fever, diarrhea, and sepsis-like symptoms.  Infectious disease work-up was negative.   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  PLAN:      Lambda light chain myeloma with 1p del:  Patient now using consolidation treatment with the GRIFFIN regimen.  She received consolidation with cycle 5 and 6 using daratumumab on day 1, Velcade on day 1, 4, 8, and 11, Revlimid 10 mg on days 1 through 14 with 7 days off, and weekly dexamethasone on a 21-day cycle.  She is now on maintenance daratumumab on day 1 and Revlimid 10 mg on days 1 through 21 on a 28-day cycle for cycles 7 through 32.  Repeat bone marrow biopsy on October 03, 2021 revealed no evidence of a myeloid neoplasm.  Repeat PET scan on February 16, 2022 reviewed independently with new bony hypermetabolism favoring sequelae of chronic compression fractures rather than progression of disease. Can consider repeating in June 2024.  Proceed with cycle 16 of treatment today. Patient takes all of her premedications at home prior to clinic, therefore these have been removed from the treatment plan.  Return to clinic in 4 weeks for further  evaluation and consideration of cycle 17. Pain: Well-controlled.  She has completed XRT.  She had kyphoplasty x2.  Continue tramadol.  Patient only uses Percocet sparingly and is given a refill today.  Repeat MRI results with essentially no change.  PET scan results as above.  Appreciate orthopedics input who recommended conservative management.   Hypocalcemia: Mild, proceed with Zometa. Renal insufficiency: Chronic and unchanged.  Patient's creatinine is 1.41.   Anemia: Patient's hemoglobin is trended down to 8.4.  She will return to clinic in 1 week for laboratory check and blood transfusion if hemoglobin falls below 8.0.  Can consider a dose reduction of Revlimid in the future if necessary. Thrombocytopenia: Slightly decreased from patient's baseline.  Platelet count is 52 today. Neutropenia: Mild.  Likely residual from recent COVID infection. Vaccinations: Continue revaccination schedule as per [facility]. Pulmonary nodule: Patient noted to have a 9 mm hypermetabolic pulmonary nodule in her left lower lobe lung.  Possibly infectious, continue to monitor closely. Cough/congestion: Residual from COVID.  Patient reports she tested negative yesterday.   Leg pain: Patient does not complain of this today.  Patient expressed understanding and was in agreement with this plan. She also understands that She can call clinic at any time with any questions, concerns, or complaints.      Cancer Staging  Lambda light chain myeloma ([place]) Staging form: Plasma Cell  Myeloma and Plasma Cell Disorders, AJCC 8th Edition - Clinical stage from 09/16/2020: RISS Stage I (Beta-2-microglobulin (mg/L): 2.5, Albumin (g/dL): 4.9, ISS: Stage I, High-risk cytogenetics: Absent, LDH: Normal) - Signed by Lloyd Huger, MD on 09/16/2020 Beta 2 microglobulin range (mg/L): Less than 3.5 Albumin range (g/dL): Greater than or equal to 3.5 Cytogenetics: 1p deletion  Lloyd Huger, MD   05/23/2022 10:15 AM

## 2022-05-24 ENCOUNTER — Ambulatory Visit: Payer: Medicare PPO

## 2022-05-29 ENCOUNTER — Encounter: Payer: Self-pay | Admitting: Oncology

## 2022-05-29 ENCOUNTER — Other Ambulatory Visit: Payer: Self-pay | Admitting: *Deleted

## 2022-05-29 ENCOUNTER — Other Ambulatory Visit: Payer: Self-pay

## 2022-05-29 DIAGNOSIS — C9000 Multiple myeloma not having achieved remission: Secondary | ICD-10-CM

## 2022-05-30 ENCOUNTER — Inpatient Hospital Stay: Payer: Medicare PPO | Attending: Oncology

## 2022-05-30 ENCOUNTER — Ambulatory Visit
Admission: RE | Admit: 2022-05-30 | Discharge: 2022-05-30 | Disposition: A | Discharge status: Home / Self Care | Payer: Medicare PPO | Source: Ambulatory Visit | Attending: Oncology | Admitting: Oncology

## 2022-05-30 ENCOUNTER — Other Ambulatory Visit: Payer: Self-pay | Admitting: Oncology

## 2022-05-30 ENCOUNTER — Other Ambulatory Visit: Payer: Medicare PPO

## 2022-05-30 ENCOUNTER — Other Ambulatory Visit: Payer: Self-pay

## 2022-05-30 ENCOUNTER — Other Ambulatory Visit: Payer: Self-pay | Admitting: *Deleted

## 2022-05-30 DIAGNOSIS — D649 Anemia, unspecified: Secondary | ICD-10-CM | POA: Diagnosis not present

## 2022-05-30 DIAGNOSIS — M25562 Pain in left knee: Secondary | ICD-10-CM | POA: Diagnosis not present

## 2022-05-30 DIAGNOSIS — Z79899 Other long term (current) drug therapy: Secondary | ICD-10-CM | POA: Diagnosis not present

## 2022-05-30 DIAGNOSIS — C9000 Multiple myeloma not having achieved remission: Secondary | ICD-10-CM

## 2022-05-30 DIAGNOSIS — Z9484 Stem cells transplant status: Secondary | ICD-10-CM | POA: Insufficient documentation

## 2022-05-30 DIAGNOSIS — Z95828 Presence of other vascular implants and grafts: Secondary | ICD-10-CM

## 2022-05-30 DIAGNOSIS — C9001 Multiple myeloma in remission: Secondary | ICD-10-CM | POA: Insufficient documentation

## 2022-05-30 DIAGNOSIS — D696 Thrombocytopenia, unspecified: Secondary | ICD-10-CM | POA: Diagnosis not present

## 2022-05-30 DIAGNOSIS — Z5112 Encounter for antineoplastic immunotherapy: Secondary | ICD-10-CM | POA: Diagnosis present

## 2022-05-30 DIAGNOSIS — Z923 Personal history of irradiation: Secondary | ICD-10-CM | POA: Insufficient documentation

## 2022-05-30 LAB — CBC WITH DIFFERENTIAL/PLATELET
Abs Immature Granulocytes: 0.01 10*3/uL (ref 0.00–0.07)
Basophils Absolute: 0 10*3/uL (ref 0.0–0.1)
Basophils Relative: 1 %
Eosinophils Absolute: 0.3 10*3/uL (ref 0.0–0.5)
Eosinophils Relative: 13 %
HCT: 27.2 % — ABNORMAL LOW (ref 36.0–46.0)
Hemoglobin: 8.5 g/dL — ABNORMAL LOW (ref 12.0–15.0)
Immature Granulocytes: 1 %
Lymphocytes Relative: 21 %
Lymphs Abs: 0.5 10*3/uL — ABNORMAL LOW (ref 0.7–4.0)
MCH: 33.2 pg (ref 26.0–34.0)
MCHC: 31.3 g/dL (ref 30.0–36.0)
MCV: 106.3 fL — ABNORMAL HIGH (ref 80.0–100.0)
Monocytes Absolute: 0.2 10*3/uL (ref 0.1–1.0)
Monocytes Relative: 9 %
Neutro Abs: 1.2 10*3/uL — ABNORMAL LOW (ref 1.7–7.7)
Neutrophils Relative %: 55 %
Platelets: 44 10*3/uL — ABNORMAL LOW (ref 150–400)
RBC: 2.56 MIL/uL — ABNORMAL LOW (ref 3.87–5.11)
RDW: 17 % — ABNORMAL HIGH (ref 11.5–15.5)
WBC: 2.2 10*3/uL — ABNORMAL LOW (ref 4.0–10.5)
nRBC: 0 % (ref 0.0–0.2)

## 2022-05-30 LAB — SAMPLE TO BLOOD BANK

## 2022-05-30 MED ORDER — GADOBUTROL 1 MMOL/ML IV SOLN
6.0000 mL | Freq: Once | INTRAVENOUS | Status: AC | PRN
  Administered 2022-05-30: 6 mL via INTRAVENOUS

## 2022-05-30 MED ORDER — SODIUM CHLORIDE 0.9% FLUSH
10.0000 mL | Freq: Once | INTRAVENOUS | Status: AC
  Administered 2022-05-30: 10 mL via INTRAVENOUS
  Filled 2022-05-30: qty 10

## 2022-05-30 MED ORDER — HEPARIN SOD (PORK) LOCK FLUSH 100 UNIT/ML IV SOLN
500.0000 [IU] | Freq: Once | INTRAVENOUS | Status: AC
  Administered 2022-05-30: 500 [IU] via INTRAVENOUS
  Filled 2022-05-30: qty 5

## 2022-05-31 ENCOUNTER — Ambulatory Visit: Payer: Medicare PPO | Admitting: Dermatology

## 2022-05-31 LAB — KAPPA/LAMBDA LIGHT CHAINS
Kappa free light chain: 11.6 mg/L (ref 3.3–19.4)
Kappa, lambda light chain ratio: 2.07 — ABNORMAL HIGH (ref 0.26–1.65)
Lambda free light chains: 5.6 mg/L — ABNORMAL LOW (ref 5.7–26.3)

## 2022-06-06 ENCOUNTER — Other Ambulatory Visit: Payer: Self-pay | Admitting: Oncology

## 2022-06-06 ENCOUNTER — Encounter: Payer: Self-pay | Admitting: Oncology

## 2022-06-06 DIAGNOSIS — C9000 Multiple myeloma not having achieved remission: Secondary | ICD-10-CM

## 2022-06-07 ENCOUNTER — Encounter: Payer: Self-pay | Admitting: Oncology

## 2022-06-14 ENCOUNTER — Other Ambulatory Visit: Payer: Self-pay

## 2022-06-17 ENCOUNTER — Other Ambulatory Visit: Payer: Self-pay

## 2022-06-20 ENCOUNTER — Inpatient Hospital Stay: Payer: Medicare PPO

## 2022-06-20 ENCOUNTER — Encounter: Payer: Self-pay | Admitting: Oncology

## 2022-06-20 ENCOUNTER — Inpatient Hospital Stay (HOSPITAL_BASED_OUTPATIENT_CLINIC_OR_DEPARTMENT_OTHER): Payer: Medicare PPO | Admitting: Oncology

## 2022-06-20 VITALS — BP 110/54 | HR 73

## 2022-06-20 DIAGNOSIS — M899 Disorder of bone, unspecified: Secondary | ICD-10-CM

## 2022-06-20 DIAGNOSIS — C9000 Multiple myeloma not having achieved remission: Secondary | ICD-10-CM | POA: Diagnosis not present

## 2022-06-20 DIAGNOSIS — M898X9 Other specified disorders of bone, unspecified site: Secondary | ICD-10-CM

## 2022-06-20 DIAGNOSIS — Z5112 Encounter for antineoplastic immunotherapy: Secondary | ICD-10-CM | POA: Diagnosis not present

## 2022-06-20 LAB — COMPREHENSIVE METABOLIC PANEL
ALT: 13 U/L (ref 0–44)
AST: 28 U/L (ref 15–41)
Albumin: 3.6 g/dL (ref 3.5–5.0)
Alkaline Phosphatase: 80 U/L (ref 38–126)
Anion gap: 9 (ref 5–15)
BUN: 13 mg/dL (ref 8–23)
CO2: 20 mmol/L — ABNORMAL LOW (ref 22–32)
Calcium: 8.4 mg/dL — ABNORMAL LOW (ref 8.9–10.3)
Chloride: 106 mmol/L (ref 98–111)
Creatinine, Ser: 1.48 mg/dL — ABNORMAL HIGH (ref 0.44–1.00)
GFR, Estimated: 39 mL/min — ABNORMAL LOW (ref >60)
Glucose, Bld: 296 mg/dL — ABNORMAL HIGH (ref 70–99)
Potassium: 3.5 mmol/L (ref 3.5–5.1)
Sodium: 135 mmol/L (ref 135–145)
Total Bilirubin: 0.4 mg/dL (ref 0.3–1.2)
Total Protein: 6.1 g/dL — ABNORMAL LOW (ref 6.5–8.1)

## 2022-06-20 LAB — CBC WITH DIFFERENTIAL/PLATELET
Abs Immature Granulocytes: 0 10*3/uL (ref 0.00–0.07)
Basophils Absolute: 0 10*3/uL (ref 0.0–0.1)
Basophils Relative: 1 %
Eosinophils Absolute: 0.1 10*3/uL (ref 0.0–0.5)
Eosinophils Relative: 7 %
HCT: 25 % — ABNORMAL LOW (ref 36.0–46.0)
Hemoglobin: 7.9 g/dL — ABNORMAL LOW (ref 12.0–15.0)
Immature Granulocytes: 0 %
Lymphocytes Relative: 20 %
Lymphs Abs: 0.4 10*3/uL — ABNORMAL LOW (ref 0.7–4.0)
MCH: 33.8 pg (ref 26.0–34.0)
MCHC: 31.6 g/dL (ref 30.0–36.0)
MCV: 106.8 fL — ABNORMAL HIGH (ref 80.0–100.0)
Monocytes Absolute: 0.2 10*3/uL (ref 0.1–1.0)
Monocytes Relative: 10 %
Neutro Abs: 1.2 10*3/uL — ABNORMAL LOW (ref 1.7–7.7)
Neutrophils Relative %: 62 %
Platelets: 50 10*3/uL — ABNORMAL LOW (ref 150–400)
RBC: 2.34 MIL/uL — ABNORMAL LOW (ref 3.87–5.11)
RDW: 17 % — ABNORMAL HIGH (ref 11.5–15.5)
WBC: 1.9 10*3/uL — ABNORMAL LOW (ref 4.0–10.5)
nRBC: 0 % (ref 0.0–0.2)

## 2022-06-20 MED ORDER — DARATUMUMAB-HYALURONIDASE-FIHJ 1800-30000 MG-UT/15ML ~~LOC~~ SOLN
1800.0000 mg | Freq: Once | SUBCUTANEOUS | Status: AC
  Administered 2022-06-20: 1800 mg via SUBCUTANEOUS
  Filled 2022-06-20: qty 15

## 2022-06-20 MED ORDER — SODIUM CHLORIDE 0.9 % IV SOLN
INTRAVENOUS | Status: DC | PRN
  Filled 2022-06-20: qty 250

## 2022-06-20 MED ORDER — DIPHENHYDRAMINE HCL 25 MG PO CAPS: 25.0000 mg | ORAL_CAPSULE | Freq: Once | ORAL | Status: DC

## 2022-06-20 MED ORDER — HEPARIN SOD (PORK) LOCK FLUSH 100 UNIT/ML IV SOLN
500.0000 [IU] | Freq: Once | INTRAVENOUS | Status: AC
  Administered 2022-06-20: 500 [IU] via INTRAVENOUS
  Filled 2022-06-20: qty 5

## 2022-06-20 MED ORDER — ACETAMINOPHEN 325 MG PO TABS: 650.0000 mg | ORAL_TABLET | Freq: Once | ORAL | Status: DC

## 2022-06-20 MED ORDER — ZOLEDRONIC ACID 4 MG/5ML IV CONC
3.3000 mg | Freq: Once | INTRAVENOUS | Status: AC
  Administered 2022-06-20: 3.3 mg via INTRAVENOUS
  Filled 2022-06-20: qty 4.13

## 2022-06-20 MED ORDER — SODIUM CHLORIDE 0.9% FLUSH
10.0000 mL | INTRAVENOUS | Status: DC | PRN
  Administered 2022-06-20: 10 mL via INTRAVENOUS
  Filled 2022-06-20: qty 10

## 2022-06-20 NOTE — Progress Notes (Signed)
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  INTERVAL HISTORY: Patient returns to clinic today for further evaluation and consideration of cycle 17 of maintenance daratumumab, Revlimid, and Zometa.  She currently feels well and is asymptomatic. She continues to have back pain, but this is well-controlled on her current dose of tramadol.  The peripheral neuropathy in her feet is essentially unchanged.  She has no other neurologic complaints.  She denies any chest pain, shortness of breath, cough, or hemoptysis.  She denies any nausea, vomiting, constipation, or diarrhea.  She has no urinary complaints.  Patient offers no further specific complaints today.  REVIEW OF SYSTEMS:   Review of Systems  Constitutional: Negative.  Negative for fever, malaise/fatigue and weight loss.  HENT: Negative.  Negative for congestion and sinus pain.   Respiratory: Negative.  Negative for cough, hemoptysis and shortness of breath.   Cardiovascular:  Negative for chest pain and leg swelling.  Gastrointestinal: Negative.  Negative for abdominal pain, diarrhea and nausea.  Genitourinary: Negative.  Negative for dysuria.  Musculoskeletal:  Positive for back pain. Negative for joint pain and neck pain.  Skin: Negative.  Negative for rash.  Neurological:  Positive for sensory change. Negative for dizziness, tremors, focal weakness, weakness and headaches.  Psychiatric/Behavioral: Negative.  The patient is not nervous/anxious.     As per HPI. Otherwise, a complete review of systems is negative.  PAST MEDICAL HISTORY: Past Medical History:  Diagnosis Date   Anxiety    Diabetes mellitus without  complication 2016   GERD (gastroesophageal reflux disease)    Hyperlipidemia    Myeloma    Myeloma    Personal history of colonic polyps    Squamous cell carcinoma of skin 11/25/2013   Left chest. WD SCC with superficial infiltration.   Tachycardia     PAST SURGICAL HISTORY: Past Surgical History:  Procedure Laterality Date   AUGMENTATION MAMMAPLASTY Bilateral 1983   silicone and replacement in 2001   BREAST ENHANCEMENT SURGERY  1983   CESAREAN SECTION  1987, 1989   COLONOSCOPY  1988, 1998,2003, 2008, 2013   COLONOSCOPY WITH PROPOFOL N/A 08/16/2016   Procedure: COLONOSCOPY WITH PROPOFOL;  Surgeon: Earline Mayotte, MD;  Location: [facility] ENDOSCOPY;  Service: Endoscopy;  Laterality: N/A;   COLONOSCOPY WITH PROPOFOL N/A 10/05/2021   Procedure: COLONOSCOPY WITH PROPOFOL;  Surgeon: Earline Mayotte, MD;  Location: [facility] ENDOSCOPY;  Service: Endoscopy;  Laterality: N/A;  HAS A PORT, NEEDS AMPICILLIN PER OFFICE   DILATION AND CURETTAGE OF UTERUS     ENDOMETRIAL ABLATION  12/1991   ganglion cyst removal      IR IMAGING GUIDED PORT INSERTION  10/27/2020   IR IMAGING GUIDED PORT INSERTION  04/04/2021   KYPHOPLASTY N/A 10/28/2020   Procedure: T12 and L3 KYPHOPLASTY;  Surgeon: Kennedy Bucker, MD;  Location: [facility] ORS;  Service: Orthopedics;  Laterality: N/A;   TONSILLECTOMY     WRIST SURGERY Right 05/1995    FAMILY HISTORY: Family History  Problem Relation Age of Onset   Colon polyps Sister    Colon cancer Father 73   Diabetes Mother    Breast cancer Neg Hx     ADVANCED DIRECTIVES (Y/N):  N  HEALTH MAINTENANCE: Social History  Tobacco Use   Smoking status: Former    Packs/day: 1.00    Years: 4.00    Additional pack years: 0.00    Total pack years: 4.00    Types: Cigarettes    Quit date: 72    Years since quitting: 42.3   Smokeless tobacco: Never  Vaping Use   Vaping Use: Never used  Substance Use Topics   Alcohol use: Yes    Alcohol/week: 1.0 - 2.0 standard drink of  alcohol    Types: 1 - 2 Glasses of wine per week    Comment: occassionally   Drug use: No     Colonoscopy:  PAP:  Bone density:  Lipid panel:  No Known Allergies  Current Outpatient Medications  Medication Sig Dispense Refill   acetaminophen (TYLENOL) 325 MG tablet Take 650 mg by mouth 2 (two) times a week. Premed for velcade. Taking Tuesdays and Friday     acyclovir (ZOVIRAX) 400 MG tablet TAKE 1 TABLET BY MOUTH TWICE DAILY 60 tablet 5   albuterol (VENTOLIN HFA) 108 (90 Base) MCG/ACT inhaler Inhale 2 puffs into the lungs every 4 (four) hours as needed.     ALPRAZolam (XANAX) 0.25 MG tablet TAKE 1 TABLET BY MOUTH AT BEDTIME AS NEEDED FOR SLEEP 30 tablet 2   ascorbic acid (VITAMIN C) 500 MG tablet Take 500 mg by mouth daily.     ASPIRIN 81 PO Take 81 mg by mouth daily.     atorvastatin (LIPITOR) 20 MG tablet Take 20 mg by mouth daily.     busPIRone (BUSPAR) 30 MG tablet Take 30 mg by mouth 2 (two) times daily.     CALCIUM-VITAMIN D PO Take by mouth 2 (two) times daily.     cyclobenzaprine (FLEXERIL) 10 MG tablet TAKE 1 TABLET BY MOUTH AT BEDTIME 30 tablet 2   dexamethasone (DECADRON) 4 MG tablet TAKE 5 TABLETS BY MOUTH ONCE A WEEK 20 tablet 3   diphenhydrAMINE (BENADRYL) 25 mg capsule Take 25 mg by mouth 2 (two) times a week. Pre med for velcade. Only Tuesdays and Fridays     diphenoxylate-atropine (LOMOTIL) 2.5-0.025 MG tablet Take by mouth as needed.     ferrous sulfate 325 (65 FE) MG tablet Take 325 mg by mouth daily with breakfast.     gabapentin (NEURONTIN) 300 MG capsule TAKE 1 CAPSULE BY MOUTH TWICE DAILY 60 capsule 2   ivabradine (CORLANOR) 5 MG TABS tablet Take 5 mg by mouth 2 (two) times daily with a meal.     lenalidomide (REVLIMID) 10 MG capsule TAKE 1 CAPSULE BY MOUTH 1 TIME A DAY FOR 21 DAYS ON THEN 7 DAYS OFF 21 capsule 0   lidocaine-prilocaine (EMLA) cream APPLY 1 APPLICATION TOPICALLY AS NEEDED 30 g 2   liraglutide (VICTOZA) 18 MG/3ML SOPN Inject 1.2 mg into the skin  daily.     Magnesium 400 MG TABS Take 1 tablet by mouth daily.     meloxicam (MOBIC) 15 MG tablet Take 15 mg by mouth daily.     Multiple Vitamin (MULTI-VITAMIN) tablet Take 1 tablet by mouth daily.     omeprazole (PRILOSEC) 40 MG capsule Take 40 mg by mouth daily.     ondansetron (ZOFRAN) 8 MG tablet Take 1 tablet (8 mg total) by mouth 2 (two) times daily as needed (Nausea or vomiting). 60 tablet 2   oxyCODONE-acetaminophen (PERCOCET/ROXICET) 5-325 MG tablet TAKE 1-2 TABLETS BY MOUTH EVERY 4 HOURS AS NEEDED FOR SEVERE PAIN 90 tablet 0   prochlorperazine (  COMPAZINE) 10 MG tablet TAKE 1 TABLET BY MOUTH EVERY 6 HOURS AS NEEDED FOR NAUSEA OR VOMITING 60 tablet 2   traMADol (ULTRAM) 50 MG tablet Take 1 tablet (50 mg total) by mouth every 6 (six) hours as needed. Take 1 to 2 tablets (100 mg total) as needed for pain. 120 tablet 2   methocarbamol (ROBAXIN) 500 MG tablet Take 1 tablet (500 mg total) by mouth every 6 (six) hours as needed for muscle spasms. (Patient not taking: Reported on 06/20/2022) 40 tablet 1   No current facility-administered medications for this visit.   Facility-Administered Medications Ordered in Other Visits  Medication Dose Route Frequency Provider Last Rate Last Admin   0.9 %  sodium chloride infusion   Intravenous Continuous Jeralyn Ruths, MD   Stopped at 04/07/21 1142   sodium chloride flush (NS) 0.9 % injection 10 mL  10 mL Intravenous PRN Jeralyn Ruths, MD   10 mL at 11/23/20 0932   sodium chloride flush (NS) 0.9 % injection 10 mL  10 mL Intravenous PRN Jeralyn Ruths, MD   10 mL at 04/18/22 1417    OBJECTIVE: Vitals:   06/20/22 0936  BP: (!) 121/48  Pulse: 83  Resp: 16  Temp: 98.6 F (37 C)  SpO2: 100%     Body mass index is 24.64 kg/m.    ECOG FS:1 - Symptomatic but completely ambulatory  General: Well-developed, well-nourished, no acute distress. Eyes: Pink conjunctiva, anicteric sclera. HEENT: Normocephalic, moist mucous membranes. Lungs:  No audible wheezing or coughing. Heart: Regular rate and rhythm. Abdomen: Soft, nontender, no obvious distention. Musculoskeletal: No edema, cyanosis, or clubbing. Neuro: Alert, answering all questions appropriately. Cranial nerves grossly intact. Skin: No rashes or petechiae noted. Psych: Normal affect.  LAB RESULTS:  Lab Results  Component Value Date   NA 135 06/20/2022   K 3.5 06/20/2022   CL 106 06/20/2022   CO2 20 (L) 06/20/2022   GLUCOSE 296 (H) 06/20/2022   BUN 13 06/20/2022   CREATININE 1.48 (H) 06/20/2022   CALCIUM 8.4 (L) 06/20/2022   PROT 6.1 (L) 06/20/2022   ALBUMIN 3.6 06/20/2022   AST 28 06/20/2022   ALT 13 06/20/2022   ALKPHOS 80 06/20/2022   BILITOT 0.4 06/20/2022   GFRNONAA 39 (L) 06/20/2022    Lab Results  Component Value Date   WBC 1.9 (L) 06/20/2022   NEUTROABS 1.2 (L) 06/20/2022   HGB 7.9 (L) 06/20/2022   HCT 25.0 (L) 06/20/2022   MCV 106.8 (H) 06/20/2022   PLT 50 (L) 06/20/2022     STUDIES: MR KNEE LEFT W WO CONTRAST  Result Date: 05/31/2022 CLINICAL DATA:  Left knee pain with swelling and limited range of motion for 2-3 months. No known injury. History of multiple myeloma. EXAM: MRI OF THE LEFT KNEE WITHOUT AND WITH CONTRAST TECHNIQUE: Multiplanar, multisequence MR imaging of the left knee was performed both before and after administration of intravenous contrast. CONTRAST:  6mL GADAVIST GADOBUTROL 1 MMOL/ML IV SOLN COMPARISON:  PET-CT 02/16/2022. FINDINGS: MENISCI Medial meniscus: There is a small radial tear at the root of the posterior horn with resulting mild peripheral extrusion of the meniscus from the joint space. No centrally displaced meniscal fragment. Lateral meniscus:  Intact with normal morphology. LIGAMENTS Cruciates: The anterior and posterior cruciate ligaments are intact. Collaterals: The medial and lateral collateral ligament complexes are intact. CARTILAGE Patellofemoral: There is chondral thinning, surface irregularity and  subchondral cyst formation superiorly at the patellar apex. There is also prominent  subchondral cyst formation throughout the central and medial aspect of the trochlea. Medial: Mild chondral thinning and surface irregularity. No focal defect identified. Lateral:  Preserved. MISCELLANEOUS Joint:  Small joint effusion with nonspecific synovial enhancement. Popliteal Fossa: The popliteus muscle and tendon are intact. Small Baker's cyst with nonspecific synovial enhancement. Extensor Mechanism: The visualized quadriceps and patellar tendons are intact. Bones: There is linear subchondral low T2 signal within the medial femoral condyle with extensive surrounding marrow T2 hyperintensity and enhancement, most likely reflecting a subchondral insufficiency fracture. There is also heterogeneous T2 hyperintensity and enhancement within the patella. On the coronal images, there are linear low T1 and high T2 signal components which could reflect a nondisplaced fracture. In the absence of recent trauma, findings could be secondary to osteonecrosis. There is no suspicious signal or enhancement within the tibia to suggest septic arthritis and osteomyelitis. There was no significant hypermetabolic activity in the region of the left knee on prior PET-CT from 3 months ago. Other: Periarticular soft tissue enhancement surrounding the medial femoral condyle. No other focal fluid collections. IMPRESSION: 1. Small radial tear at the root of the posterior horn of the medial meniscus with resulting mild peripheral extrusion of the meniscus from the joint space. 2. Probable subchondral insufficiency fracture of the medial femoral condyle with extensive surrounding marrow edema and enhancement. Heterogeneous T2 hyperintensity and enhancement within the patella. Although findings could reflect a nondisplaced fracture, in the absence of recent trauma, osteonecrosis would be a consideration. Recommend plain film correlation. 3. Small knee joint  effusion and small Baker's cyst with nonspecific synovial enhancement. 4. The lateral meniscus, cruciate and collateral ligaments are intact. Electronically Signed   By: Carey Bullocks M.D.   On: 05/31/2022 16:00    ONCOLOGY HISTORY:  Diagnosis confirmed from bone biopsy on August 17, 2020.  Initially her lambda free light chains were elevated at 432.1, but now are within normal limits at 12.1.  Immunoglobulins and SPEP are normal.  Bone marrow biopsy completed on August 26, 2020 revealed 25% plasma cells along with the deletion of 1p which is considered poor prognosis.  PET scan results from September 06, 2020 reviewed independently with 3 hypermetabolic lesions in right C5-6 facets, right iliac crest lesion, and L3 vertebral lesion.  After lengthy discussion with the patient, she agreed to pursue chemotherapy with daratumumab, Velcade, Revlimid, and dexamethasone followed by autologous bone marrow transplant.  Patient received weekly daratumumab for 4 cycles along with Velcade on days 1, 4, 8, and 11.  She also received Revlimid on days 1 through 14 of a 21-day cycle.  She completed cycle 4 of treatment on January 14, 2021.  Subsequent bone marrow biopsy on January 24, 2021 revealed complete remission with no evidence of myeloma.  Patient underwent autologous stem cell transplant at [facility] on February 24, 2021.  She was readmitted to the hospital on March 04, 2021 with neutropenic fever, diarrhea, and sepsis-like symptoms.  Infectious disease work-up was negative.   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  PLAN:      Lambda light chain myeloma with 1p del:  Patient now using consolidation treatment with the GRIFFIN regimen.  She received consolidation with cycle 5 and 6 using daratumumab on day 1, Velcade on day 1, 4, 8, and 11, Revlimid 10 mg on days 1 through 14 with 7 days off, and weekly dexamethasone on a 21-day cycle.  She is now on maintenance daratumumab on  day 1 and  Revlimid 10 mg on days 1 through 21 on a 28-day cycle for cycles 7 through 32.  Repeat bone marrow biopsy on October 03, 2021 revealed no evidence of a myeloid neoplasm.  Repeat PET scan on February 16, 2022 reviewed independently with new bony hypermetabolism favoring sequelae of chronic compression fractures rather than progression of disease. Can consider repeating in June 2024.  Proceed with cycle 17 of treatment today.  Patient takes all of her premedications at home prior to clinic, therefore these have been removed from the treatment plan.  Return to clinic in 4 weeks for further evaluation and consideration of cycle 18. Pain: Well-controlled.  She has completed XRT.  She had kyphoplasty x2.  Continue tramadol.  Patient only uses Percocet sparingly and is given a refill today.  Repeat MRI results with essentially no change.  PET scan results as above.  Appreciate orthopedics input who recommended conservative management.   Hypocalcemia: Chronic and unchanged.  Proceed with Zometa. Renal insufficiency: Chronic and unchanged.  Patient's creatinine is 1.48.   Anemia: Patient's hemoglobin continues to trend down and is now 7.9.  Patient is going out of town tomorrow, therefore she will return to clinic in 1 week for 1 unit packed red blood cells.  Can consider a dose reduction of Revlimid in the future if necessary. Thrombocytopenia: Chronic and unchanged.  Patient's platelet count is 50.   Neutropenia: Chronic and unchanged.  Monitor. Vaccinations: Continue revaccination schedule as per [facility]. Pulmonary nodule: Patient noted to have a 9 mm hypermetabolic pulmonary nodule in her left lower lobe lung.  Possibly infectious, continue to monitor closely. Cough/congestion: Resolved.   Leg pain: Patient does not complain of this today.  Patient expressed understanding and was in agreement with this plan. She also understands that She can call clinic at any time with any questions,  concerns, or complaints.      Cancer Staging  Lambda light chain myeloma Staging form: Plasma Cell Myeloma and Plasma Cell Disorders, AJCC 8th Edition - Clinical stage from 09/16/2020: RISS Stage I (Beta-2-microglobulin (mg/L): 2.5, Albumin (g/dL): 4.9, ISS: Stage I, High-risk cytogenetics: Absent, LDH: Normal) - Signed by Jeralyn Ruths, MD on 09/16/2020 Beta 2 microglobulin range (mg/L): Less than 3.5 Albumin range (g/dL): Greater than or equal to 3.5 Cytogenetics: 1p deletion  Jeralyn Ruths, MD   06/20/2022 10:24 AM

## 2022-06-21 ENCOUNTER — Other Ambulatory Visit: Payer: Self-pay

## 2022-06-27 ENCOUNTER — Other Ambulatory Visit: Payer: Self-pay | Admitting: *Deleted

## 2022-06-27 ENCOUNTER — Encounter: Payer: Self-pay | Admitting: Oncology

## 2022-06-27 DIAGNOSIS — C9000 Multiple myeloma not having achieved remission: Secondary | ICD-10-CM

## 2022-06-28 ENCOUNTER — Telehealth: Payer: Self-pay

## 2022-06-28 ENCOUNTER — Inpatient Hospital Stay: Payer: Medicare PPO | Attending: Oncology

## 2022-06-28 ENCOUNTER — Other Ambulatory Visit: Payer: Self-pay

## 2022-06-28 DIAGNOSIS — Z95828 Presence of other vascular implants and grafts: Secondary | ICD-10-CM

## 2022-06-28 DIAGNOSIS — Z923 Personal history of irradiation: Secondary | ICD-10-CM | POA: Diagnosis not present

## 2022-06-28 DIAGNOSIS — Z9484 Stem cells transplant status: Secondary | ICD-10-CM | POA: Insufficient documentation

## 2022-06-28 DIAGNOSIS — C9001 Multiple myeloma in remission: Secondary | ICD-10-CM | POA: Diagnosis present

## 2022-06-28 DIAGNOSIS — D61818 Other pancytopenia: Secondary | ICD-10-CM | POA: Diagnosis not present

## 2022-06-28 DIAGNOSIS — Z5112 Encounter for antineoplastic immunotherapy: Secondary | ICD-10-CM | POA: Diagnosis present

## 2022-06-28 DIAGNOSIS — Z79899 Other long term (current) drug therapy: Secondary | ICD-10-CM | POA: Diagnosis not present

## 2022-06-28 DIAGNOSIS — C9000 Multiple myeloma not having achieved remission: Secondary | ICD-10-CM

## 2022-06-28 LAB — CBC WITH DIFFERENTIAL/PLATELET
Abs Immature Granulocytes: 0.01 10*3/uL (ref 0.00–0.07)
Basophils Absolute: 0 10*3/uL (ref 0.0–0.1)
Basophils Relative: 0 %
Eosinophils Absolute: 0.5 10*3/uL (ref 0.0–0.5)
Eosinophils Relative: 17 %
HCT: 26.6 % — ABNORMAL LOW (ref 36.0–46.0)
Hemoglobin: 8.4 g/dL — ABNORMAL LOW (ref 12.0–15.0)
Immature Granulocytes: 0 %
Lymphocytes Relative: 13 %
Lymphs Abs: 0.4 10*3/uL — ABNORMAL LOW (ref 0.7–4.0)
MCH: 34.3 pg — ABNORMAL HIGH (ref 26.0–34.0)
MCHC: 31.6 g/dL (ref 30.0–36.0)
MCV: 108.6 fL — ABNORMAL HIGH (ref 80.0–100.0)
Monocytes Absolute: 0.2 10*3/uL (ref 0.1–1.0)
Monocytes Relative: 7 %
Neutro Abs: 1.9 10*3/uL (ref 1.7–7.7)
Neutrophils Relative %: 63 %
Platelets: 38 10*3/uL — ABNORMAL LOW (ref 150–400)
RBC: 2.45 MIL/uL — ABNORMAL LOW (ref 3.87–5.11)
RDW: 17.2 % — ABNORMAL HIGH (ref 11.5–15.5)
WBC: 3.1 10*3/uL — ABNORMAL LOW (ref 4.0–10.5)
nRBC: 0 % (ref 0.0–0.2)

## 2022-06-28 LAB — COMPREHENSIVE METABOLIC PANEL
ALT: 18 U/L (ref 0–44)
AST: 29 U/L (ref 15–41)
Albumin: 3.8 g/dL (ref 3.5–5.0)
Alkaline Phosphatase: 98 U/L (ref 38–126)
Anion gap: 8 (ref 5–15)
BUN: 16 mg/dL (ref 8–23)
CO2: 22 mmol/L (ref 22–32)
Calcium: 8.3 mg/dL — ABNORMAL LOW (ref 8.9–10.3)
Chloride: 105 mmol/L (ref 98–111)
Creatinine, Ser: 1.19 mg/dL — ABNORMAL HIGH (ref 0.44–1.00)
GFR, Estimated: 50 mL/min — ABNORMAL LOW (ref >60)
Glucose, Bld: 180 mg/dL — ABNORMAL HIGH (ref 70–99)
Potassium: 3 mmol/L — ABNORMAL LOW (ref 3.5–5.1)
Sodium: 135 mmol/L (ref 135–145)
Total Bilirubin: 0.4 mg/dL (ref 0.3–1.2)
Total Protein: 6.3 g/dL — ABNORMAL LOW (ref 6.5–8.1)

## 2022-06-28 LAB — SAMPLE TO BLOOD BANK

## 2022-06-28 MED ORDER — HEPARIN SOD (PORK) LOCK FLUSH 100 UNIT/ML IV SOLN
500.0000 [IU] | Freq: Once | INTRAVENOUS | Status: AC
  Administered 2022-06-28: 500 [IU] via INTRAVENOUS
  Filled 2022-06-28: qty 5

## 2022-06-28 MED ORDER — SODIUM CHLORIDE 0.9% FLUSH
10.0000 mL | Freq: Once | INTRAVENOUS | Status: AC
  Administered 2022-06-28: 10 mL via INTRAVENOUS
  Filled 2022-06-28: qty 10

## 2022-06-28 NOTE — Telephone Encounter (Signed)
Informed patient hgb 8.4 and per Dr. Irving Copas no fluids needed. Advised patient to call clinic if any symptoms remain

## 2022-06-29 ENCOUNTER — Inpatient Hospital Stay: Payer: Medicare PPO

## 2022-06-29 LAB — KAPPA/LAMBDA LIGHT CHAINS
Kappa free light chain: 8.5 mg/L (ref 3.3–19.4)
Kappa, lambda light chain ratio: 1.29 (ref 0.26–1.65)
Lambda free light chains: 6.6 mg/L (ref 5.7–26.3)

## 2022-06-30 ENCOUNTER — Encounter: Payer: Self-pay | Admitting: Oncology

## 2022-07-03 ENCOUNTER — Other Ambulatory Visit: Payer: Self-pay | Admitting: Oncology

## 2022-07-03 DIAGNOSIS — R202 Paresthesia of skin: Secondary | ICD-10-CM

## 2022-07-03 DIAGNOSIS — R200 Anesthesia of skin: Secondary | ICD-10-CM

## 2022-07-04 ENCOUNTER — Encounter: Payer: Self-pay | Admitting: Oncology

## 2022-07-04 ENCOUNTER — Other Ambulatory Visit: Payer: Self-pay | Admitting: Oncology

## 2022-07-04 ENCOUNTER — Other Ambulatory Visit: Payer: Self-pay | Admitting: *Deleted

## 2022-07-04 DIAGNOSIS — C9000 Multiple myeloma not having achieved remission: Secondary | ICD-10-CM

## 2022-07-05 ENCOUNTER — Other Ambulatory Visit: Payer: Self-pay

## 2022-07-06 ENCOUNTER — Other Ambulatory Visit: Payer: Self-pay

## 2022-07-13 ENCOUNTER — Other Ambulatory Visit: Payer: Self-pay

## 2022-07-14 ENCOUNTER — Other Ambulatory Visit: Payer: Self-pay

## 2022-07-18 ENCOUNTER — Inpatient Hospital Stay: Payer: Medicare PPO

## 2022-07-18 ENCOUNTER — Ambulatory Visit: Payer: Medicare PPO

## 2022-07-18 ENCOUNTER — Encounter: Payer: Self-pay | Admitting: Nurse Practitioner

## 2022-07-18 ENCOUNTER — Ambulatory Visit: Payer: Medicare PPO | Admitting: Nurse Practitioner

## 2022-07-18 ENCOUNTER — Inpatient Hospital Stay (HOSPITAL_BASED_OUTPATIENT_CLINIC_OR_DEPARTMENT_OTHER): Payer: Medicare PPO | Admitting: Nurse Practitioner

## 2022-07-18 ENCOUNTER — Other Ambulatory Visit: Payer: Medicare PPO

## 2022-07-18 ENCOUNTER — Other Ambulatory Visit: Payer: Self-pay

## 2022-07-18 VITALS — BP 119/45 | HR 79 | Temp 98.2°F | Resp 20 | Wt 133.5 lb

## 2022-07-18 DIAGNOSIS — Z5112 Encounter for antineoplastic immunotherapy: Secondary | ICD-10-CM | POA: Diagnosis not present

## 2022-07-18 DIAGNOSIS — C9000 Multiple myeloma not having achieved remission: Secondary | ICD-10-CM

## 2022-07-18 DIAGNOSIS — D649 Anemia, unspecified: Secondary | ICD-10-CM | POA: Diagnosis not present

## 2022-07-18 DIAGNOSIS — D61818 Other pancytopenia: Secondary | ICD-10-CM | POA: Diagnosis not present

## 2022-07-18 DIAGNOSIS — Z95828 Presence of other vascular implants and grafts: Secondary | ICD-10-CM

## 2022-07-18 DIAGNOSIS — D696 Thrombocytopenia, unspecified: Secondary | ICD-10-CM

## 2022-07-18 LAB — CBC WITH DIFFERENTIAL/PLATELET
Abs Immature Granulocytes: 0.03 10*3/uL (ref 0.00–0.07)
Basophils Absolute: 0 10*3/uL (ref 0.0–0.1)
Basophils Relative: 1 %
Eosinophils Absolute: 0.1 10*3/uL (ref 0.0–0.5)
Eosinophils Relative: 4 %
HCT: 24 % — ABNORMAL LOW (ref 36.0–46.0)
Hemoglobin: 7.5 g/dL — ABNORMAL LOW (ref 12.0–15.0)
Immature Granulocytes: 2 %
Lymphocytes Relative: 16 %
Lymphs Abs: 0.3 10*3/uL — ABNORMAL LOW (ref 0.7–4.0)
MCH: 34.6 pg — ABNORMAL HIGH (ref 26.0–34.0)
MCHC: 31.3 g/dL (ref 30.0–36.0)
MCV: 110.6 fL — ABNORMAL HIGH (ref 80.0–100.0)
Monocytes Absolute: 0.3 10*3/uL (ref 0.1–1.0)
Monocytes Relative: 17 %
Neutro Abs: 1.2 10*3/uL — ABNORMAL LOW (ref 1.7–7.7)
Neutrophils Relative %: 60 %
Platelets: 31 10*3/uL — ABNORMAL LOW (ref 150–400)
RBC: 2.17 MIL/uL — ABNORMAL LOW (ref 3.87–5.11)
RDW: 16 % — ABNORMAL HIGH (ref 11.5–15.5)
WBC: 1.9 10*3/uL — ABNORMAL LOW (ref 4.0–10.5)
nRBC: 0 % (ref 0.0–0.2)

## 2022-07-18 LAB — COMPREHENSIVE METABOLIC PANEL
ALT: 14 U/L (ref 0–44)
AST: 24 U/L (ref 15–41)
Albumin: 3.7 g/dL (ref 3.5–5.0)
Alkaline Phosphatase: 102 U/L (ref 38–126)
Anion gap: 10 (ref 5–15)
BUN: 17 mg/dL (ref 8–23)
CO2: 19 mmol/L — ABNORMAL LOW (ref 22–32)
Calcium: 8.5 mg/dL — ABNORMAL LOW (ref 8.9–10.3)
Chloride: 107 mmol/L (ref 98–111)
Creatinine, Ser: 1.6 mg/dL — ABNORMAL HIGH (ref 0.44–1.00)
GFR, Estimated: 35 mL/min — ABNORMAL LOW (ref >60)
Glucose, Bld: 219 mg/dL — ABNORMAL HIGH (ref 70–99)
Potassium: 4.2 mmol/L (ref 3.5–5.1)
Sodium: 136 mmol/L (ref 135–145)
Total Bilirubin: 0.8 mg/dL (ref 0.3–1.2)
Total Protein: 5.8 g/dL — ABNORMAL LOW (ref 6.5–8.1)

## 2022-07-18 LAB — PREPARE RBC (CROSSMATCH)

## 2022-07-18 LAB — TYPE AND SCREEN: ABO/RH(D): O POS

## 2022-07-18 MED ORDER — HEPARIN SOD (PORK) LOCK FLUSH 100 UNIT/ML IV SOLN
500.0000 [IU] | Freq: Once | INTRAVENOUS | Status: AC
  Administered 2022-07-18: 500 [IU] via INTRAVENOUS
  Filled 2022-07-18: qty 5

## 2022-07-18 MED ORDER — ZOLEDRONIC ACID 4 MG/5ML IV CONC
3.3000 mg | Freq: Once | INTRAVENOUS | Status: AC
  Administered 2022-07-18: 3.3 mg via INTRAVENOUS
  Filled 2022-07-18: qty 4.13

## 2022-07-18 MED ORDER — SODIUM CHLORIDE 0.9 % IV SOLN
Freq: Once | INTRAVENOUS | Status: AC
  Filled 2022-07-18: qty 250

## 2022-07-18 NOTE — Progress Notes (Signed)
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 Her son got married over the weekend and she has been very active in [employer]. She's tired slightly more than her baseline. Did have a nosebleed over the weekend. Has noticed increased bruising. No fevers or infections. Continues to have back pain and some rib pain. Denies black or bloody stools.    REVIEW OF SYSTEMS:   Review of Systems  Constitutional:  Positive for malaise/fatigue. Negative for fever and weight loss.  HENT:  Positive for nosebleeds. Negative for congestion and sinus pain.   Respiratory: Negative.  Negative for cough, hemoptysis and shortness of breath.   Cardiovascular:  Negative for chest pain and leg swelling.  Gastrointestinal: Negative.  Negative for abdominal pain, blood in stool, diarrhea, melena and nausea.  Genitourinary:  Negative for dysuria and hematuria.  Musculoskeletal:  Positive for back pain. Negative for falls, joint pain and neck pain.  Skin: Negative.  Negative for rash.  Neurological:  Positive for sensory change. Negative for dizziness, tremors, focal weakness, weakness and headaches.  Endo/Heme/Allergies:  Bruises/bleeds easily.  Psychiatric/Behavioral: Negative.  The patient is not nervous/anxious.   As per HPI. Otherwise, a complete review of systems is negative.  PAST MEDICAL HISTORY: Past Medical History:  Diagnosis Date   Anxiety    Diabetes mellitus without complication (HCC) 2016   GERD (gastroesophageal reflux disease)     Hyperlipidemia    Myeloma (HCC)    Myeloma (HCC)    Personal history of colonic polyps    Squamous cell carcinoma of skin 11/25/2013   Left chest. WD SCC with superficial infiltration.   Tachycardia     PAST SURGICAL HISTORY: Past Surgical History:  Procedure Laterality Date   AUGMENTATION MAMMAPLASTY Bilateral 1983   silicone and replacement in 2001   BREAST ENHANCEMENT SURGERY  1983   CESAREAN SECTION  1987, 1989   COLONOSCOPY  1988, 1998,2003, 2008, 2013   COLONOSCOPY WITH PROPOFOL N/A 08/16/2016   Procedure: COLONOSCOPY WITH PROPOFOL;  Surgeon: Earline Mayotte, MD;  Location: [facility];  Service: Endoscopy;  Laterality: N/A;   COLONOSCOPY WITH PROPOFOL N/A 10/05/2021   Procedure: COLONOSCOPY WITH PROPOFOL;  Surgeon: Earline Mayotte, MD;  Location: [facility];  Service: Endoscopy;  Laterality: N/A;  HAS A PORT, NEEDS AMPICILLIN PER OFFICE   DILATION AND CURETTAGE OF UTERUS     ENDOMETRIAL ABLATION  12/1991   ganglion cyst removal      IR IMAGING GUIDED PORT INSERTION  10/27/2020   IR IMAGING GUIDED PORT INSERTION  04/04/2021   KYPHOPLASTY N/A 10/28/2020   Procedure: T12 and L3 KYPHOPLASTY;  Surgeon: Kennedy Bucker, MD;  Location: [facility] ORS;  Service: Orthopedics;  Laterality: N/A;   TONSILLECTOMY     WRIST SURGERY Right 05/1995    FAMILY HISTORY: Family History  Problem Relation Age of Onset   Colon polyps Sister    Colon cancer Father 24   Diabetes Mother    Breast cancer Neg Hx     ADVANCED DIRECTIVES (Y/N):  N  HEALTH MAINTENANCE: Social History   Tobacco Use   Smoking  status: Former    Packs/day: 1.00    Years: 4.00    Additional pack years: 0.00    Total pack years: 4.00    Types: Cigarettes    Quit date: 56    Years since quitting: 42.4   Smokeless tobacco: Never  Vaping Use   Vaping Use: Never used  Substance Use Topics   Alcohol use: Yes    Alcohol/week: 1.0 - 2.0 standard drink of alcohol    Types: 1 - 2 Glasses of wine per week     Comment: occassionally   Drug use: No     Colonoscopy:  PAP:  Bone density:  Lipid panel:  No Known Allergies  Current Outpatient Medications  Medication Sig Dispense Refill   acetaminophen (TYLENOL) 325 MG tablet Take 650 mg by mouth 2 (two) times a week. Premed for velcade. Taking Tuesdays and Friday     acyclovir (ZOVIRAX) 400 MG tablet TAKE 1 TABLET BY MOUTH TWICE DAILY 60 tablet 5   albuterol (VENTOLIN HFA) 108 (90 Base) MCG/ACT inhaler Inhale 2 puffs into the lungs every 4 (four) hours as needed.     ALPRAZolam (XANAX) 0.25 MG tablet TAKE 1 TABLET BY MOUTH AT BEDTIME AS NEEDED FOR SLEEP 30 tablet 2   ascorbic acid (VITAMIN C) 500 MG tablet Take 500 mg by mouth daily.     ASPIRIN 81 PO Take 81 mg by mouth daily.     atorvastatin (LIPITOR) 20 MG tablet Take 20 mg by mouth daily.     busPIRone (BUSPAR) 30 MG tablet Take 30 mg by mouth 2 (two) times daily.     CALCIUM-VITAMIN D PO Take by mouth 2 (two) times daily.     cyclobenzaprine (FLEXERIL) 10 MG tablet TAKE 1 TABLET BY MOUTH AT BEDTIME 30 tablet 2   dexamethasone (DECADRON) 4 MG tablet TAKE 5 TABLETS BY MOUTH ONCE A WEEK 20 tablet 3   diphenhydrAMINE (BENADRYL) 25 mg capsule Take 25 mg by mouth 2 (two) times a week. Pre med for velcade. Only Tuesdays and Fridays     diphenoxylate-atropine (LOMOTIL) 2.5-0.025 MG tablet Take by mouth as needed.     ferrous sulfate 325 (65 FE) MG tablet Take 325 mg by mouth daily with breakfast.     gabapentin (NEURONTIN) 300 MG capsule TAKE 1 CAPSULE BY MOUTH TWICE DAILY 60 capsule 2   ivabradine (CORLANOR) 5 MG TABS tablet Take 5 mg by mouth 2 (two) times daily with a meal.     lenalidomide (REVLIMID) 10 MG capsule TAKE 1 CAPSULE BY MOUTH 1 TIME A DAY FOR 21 DAYS ON THEN 7 DAYS OFF 21 capsule 0   lidocaine-prilocaine (EMLA) cream APPLY 1 APPLICATION TOPICALLY AS NEEDED 30 g 2   liraglutide (VICTOZA) 18 MG/3ML SOPN Inject 1.2 mg into the skin daily.     Magnesium 400 MG TABS Take 1 tablet by  mouth daily.     meloxicam (MOBIC) 15 MG tablet Take 15 mg by mouth daily.     methocarbamol (ROBAXIN) 500 MG tablet Take 1 tablet (500 mg total) by mouth every 6 (six) hours as needed for muscle spasms. 40 tablet 1   Multiple Vitamin (MULTI-VITAMIN) tablet Take 1 tablet by mouth daily.     omeprazole (PRILOSEC) 40 MG capsule Take 40 mg by mouth daily.     ondansetron (ZOFRAN) 8 MG tablet Take 1 tablet (8 mg total) by mouth 2 (two) times daily as needed (Nausea or vomiting). 60 tablet 2   oxyCODONE-acetaminophen (PERCOCET/ROXICET)  5-325 MG tablet TAKE 1-2 TABLETS BY MOUTH EVERY 4 HOURS AS NEEDED FOR SEVERE PAIN 90 tablet 0   prochlorperazine (COMPAZINE) 10 MG tablet TAKE 1 TABLET BY MOUTH EVERY 6 HOURS AS NEEDED FOR NAUSEA OR VOMITING 60 tablet 2   traMADol (ULTRAM) 50 MG tablet TAKE 1 TABLET BY MOUTH EVERY 6 HOURS AS NEEDED FOR PAIN 60 tablet 2   No current facility-administered medications for this visit.   Facility-Administered Medications Ordered in Other Visits  Medication Dose Route Frequency Provider Last Rate Last Admin   0.9 %  sodium chloride infusion   Intravenous Continuous Jeralyn Ruths, MD   Stopped at 04/07/21 1142   0.9 %  sodium chloride infusion   Intravenous Once Alinda Dooms, NP 999 mL/hr at 07/18/22 1449 New Bag at 07/18/22 1449   sodium chloride flush (NS) 0.9 % injection 10 mL  10 mL Intravenous PRN Jeralyn Ruths, MD   10 mL at 11/23/20 0932   sodium chloride flush (NS) 0.9 % injection 10 mL  10 mL Intravenous PRN Jeralyn Ruths, MD   10 mL at 04/18/22 1417   zoledronic acid (ZOMETA) 3.3 mg in sodium chloride 0.9 % 100 mL IVPB  3.3 mg Intravenous Once Jeralyn Ruths, MD        OBJECTIVE: Vitals:   07/18/22 1337  BP: (!) 119/45  Pulse: 79  Resp: 20  Temp: 98.2 F (36.8 C)  SpO2: 100%     Body mass index is 25.22 kg/m.    ECOG FS:1 - Symptomatic but completely ambulatory  General: Well-developed, well-nourished, no acute distress. Eyes:  Pink conjunctiva, anicteric sclera. HEENT: Normocephalic, moist mucous membranes. Lungs: No audible wheezing or coughing. Heart: Regular rate and rhythm. Abdomen: Soft, nontender, no obvious distention. Musculoskeletal: No edema, cyanosis, or clubbing. Neuro: Alert, answering all questions appropriately. Cranial nerves grossly intact. Skin: several scattered bruises noted in various stages of healing.  Psych: Normal affect.  LAB RESULTS:  Lab Results  Component Value Date   NA 136 07/18/2022   K 4.2 07/18/2022   CL 107 07/18/2022   CO2 19 (L) 07/18/2022   GLUCOSE 219 (H) 07/18/2022   BUN 17 07/18/2022   CREATININE 1.60 (H) 07/18/2022   CALCIUM 8.5 (L) 07/18/2022   PROT 5.8 (L) 07/18/2022   ALBUMIN 3.7 07/18/2022   AST 24 07/18/2022   ALT 14 07/18/2022   ALKPHOS 102 07/18/2022   BILITOT 0.8 07/18/2022   GFRNONAA 35 (L) 07/18/2022    Lab Results  Component Value Date   WBC 1.9 (L) 07/18/2022   NEUTROABS 1.2 (L) 07/18/2022   HGB 7.5 (L) 07/18/2022   HCT 24.0 (L) 07/18/2022   MCV 110.6 (H) 07/18/2022   PLT 31 (L) 07/18/2022     STUDIES: No results found.  ONCOLOGY HISTORY:  Diagnosis confirmed from bone biopsy on August 17, 2020.  Initially her lambda free light chains were elevated at 432.1, but now are within normal limits at 12.1.  Immunoglobulins and SPEP are normal.  Bone marrow biopsy completed on August 26, 2020 revealed 25% plasma cells along with the deletion of 1p which is considered poor prognosis.  PET scan results from September 06, 2020 reviewed independently with 3 hypermetabolic lesions in right C5-6 facets, right iliac crest lesion, and L3 vertebral lesion.  After lengthy discussion with the patient, she agreed to pursue chemotherapy with daratumumab, Velcade, Revlimid, and dexamethasone followed by autologous bone marrow transplant.  Patient received weekly daratumumab for 4 cycles along  with Velcade on days 1, 4, 8, and 11.  She also received Revlimid on days 1  through 14 of a 21-day cycle.  She completed cycle 4 of treatment on January 14, 2021.  Subsequent bone marrow biopsy on January 24, 2021 revealed complete remission with no evidence of myeloma.  Patient underwent autologous stem cell transplant at [facility] on February 24, 2021.  She was readmitted to the hospital on March 04, 2021 with neutropenic fever, diarrhea, and sepsis-like symptoms.  Infectious disease work-up was negative.   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  PLAN:      Lambda light chain myeloma with 1p del:  Patient now using consolidation treatment with the GRIFFIN regimen.  She received consolidation with cycle 5 and 6 using daratumumab on day 1, Velcade on day 1, 4, 8, and 11, Revlimid 10 mg on days 1 through 14 with 7 days off, and weekly dexamethasone on a 21-day cycle.  She is now on maintenance daratumumab on day 1 and Revlimid 10 mg on days 1 through 21 on a 28-day cycle for cycles 7 through 32.  Repeat bone marrow biopsy on October 03, 2021 revealed no evidence of a myeloid neoplasm.  Repeat PET scan on February 16, 2022 with new bony hypermetabolism favoring sequelae of chronic compression fractures rather than progression of disease. Can consider repeating in June 2024. Hemoglobin dropped to 7.5, platelet 31, ANC 1.2. Counts have been downtrending and pancytopenic. Discussed with Dr. Orlie Dakin who recommends repeating bone marrow biopsy. Hold revlimid.  Anemia- worse. Hemoglobin 7.5. Increased macrocytosis. Plan for transfusion of 1 unit of irradiated pRBCs tomorrow. Plan for transfusion to maintain hemoglobin > 8.  Thrombocytopenia- worsening. Plt decreased to 31. Symptomatic with bruising and nosebleeds. Plan for transfusion for plt < 20.  Neutropenia- wbc 1.9, anc 1.2. Decreased but stable. Monitor. Continue viral prophylaxis.  Bone lesions & pain- s/p XRT, kyphoplasty x 2. Repeat pet has been ordered for 07/31/22. Proceed with zometa  today.  Hypocalcemia- chronic. Increase oral calcium to 1600 mg daily along with 1000 iu vitamin d. Ok for [employer] today.  Pulmonary nodule- previously noted to have 9 mm hypermetabolic pulmonary nodule in LLL lung. Etiology unclear- infectious vs others. PET to reevaluate has been scheduled.  Elevated serum creatinine- Cr 1.6. GFR 32. Worse. Concern for progression of myeloma. IV fluids today.   Disposition:  IV fluids and zometa today. Hold treatment.  Blood tomorrow Bone marrow biopsy asap 1 week- port/lab (cbc, hold tube), D2 poss blood or platelets 2 weeks - port/lab (cbc, cmp, hold tube), See [place], D2 poss blood or platelets- la  Patient expressed understanding and was in agreement with this plan. She also understands that She can call clinic at any time with any questions, concerns, or complaints.    Cancer Staging  Lambda light chain myeloma (HCC) Staging form: Plasma Cell Myeloma and Plasma Cell Disorders, AJCC 8th Edition - Clinical stage from 09/16/2020: RISS Stage I (Beta-2-microglobulin (mg/L): 2.5, Albumin (g/dL): 4.9, ISS: Stage I, High-risk cytogenetics: Absent, LDH: Normal) - Signed by Jeralyn Ruths, MD on 09/16/2020 Beta 2 microglobulin range (mg/L): Less than 3.5 Albumin range (g/dL): Greater than or equal to 3.5 Cytogenetics: 1p deletion  Alinda Dooms, NP 07/18/2022

## 2022-07-19 ENCOUNTER — Other Ambulatory Visit: Payer: Self-pay | Admitting: Oncology

## 2022-07-19 ENCOUNTER — Inpatient Hospital Stay: Payer: Medicare PPO

## 2022-07-19 DIAGNOSIS — Z5112 Encounter for antineoplastic immunotherapy: Secondary | ICD-10-CM | POA: Diagnosis not present

## 2022-07-19 DIAGNOSIS — C9000 Multiple myeloma not having achieved remission: Secondary | ICD-10-CM

## 2022-07-19 LAB — KAPPA/LAMBDA LIGHT CHAINS
Kappa free light chain: 7.7 mg/L (ref 3.3–19.4)
Kappa, lambda light chain ratio: 0.88 (ref 0.26–1.65)
Lambda free light chains: 8.8 mg/L (ref 5.7–26.3)

## 2022-07-19 LAB — TYPE AND SCREEN: Antibody Screen: POSITIVE

## 2022-07-19 LAB — BPAM RBC: Unit Type and Rh: 5100

## 2022-07-19 MED ORDER — HEPARIN SOD (PORK) LOCK FLUSH 100 UNIT/ML IV SOLN
500.0000 [IU] | Freq: Every day | INTRAVENOUS | Status: AC | PRN
  Administered 2022-07-19: 500 [IU]
  Filled 2022-07-19: qty 5

## 2022-07-19 MED ORDER — SODIUM CHLORIDE 0.9% IV SOLUTION
250.0000 mL | Freq: Once | INTRAVENOUS | Status: AC
  Administered 2022-07-19: 250 mL via INTRAVENOUS
  Filled 2022-07-19: qty 250

## 2022-07-20 LAB — TYPE AND SCREEN: Unit division: 0

## 2022-07-20 LAB — BPAM RBC
Blood Product Expiration Date: 202406182359
ISSUE DATE / TIME: 202405220850

## 2022-07-24 ENCOUNTER — Encounter: Payer: Self-pay | Admitting: Nurse Practitioner

## 2022-07-25 ENCOUNTER — Telehealth: Payer: Self-pay

## 2022-07-25 ENCOUNTER — Inpatient Hospital Stay: Payer: Medicare PPO

## 2022-07-25 DIAGNOSIS — Z5112 Encounter for antineoplastic immunotherapy: Secondary | ICD-10-CM | POA: Diagnosis not present

## 2022-07-25 DIAGNOSIS — Z95828 Presence of other vascular implants and grafts: Secondary | ICD-10-CM

## 2022-07-25 DIAGNOSIS — C9000 Multiple myeloma not having achieved remission: Secondary | ICD-10-CM

## 2022-07-25 LAB — CBC WITH DIFFERENTIAL/PLATELET
Abs Immature Granulocytes: 0 10*3/uL (ref 0.00–0.07)
Basophils Absolute: 0 10*3/uL (ref 0.0–0.1)
Basophils Relative: 1 %
Eosinophils Absolute: 0.2 10*3/uL (ref 0.0–0.5)
Eosinophils Relative: 10 %
HCT: 30.6 % — ABNORMAL LOW (ref 36.0–46.0)
Hemoglobin: 9.7 g/dL — ABNORMAL LOW (ref 12.0–15.0)
Immature Granulocytes: 0 %
Lymphocytes Relative: 21 %
Lymphs Abs: 0.4 10*3/uL — ABNORMAL LOW (ref 0.7–4.0)
MCH: 33.8 pg (ref 26.0–34.0)
MCHC: 31.7 g/dL (ref 30.0–36.0)
MCV: 106.6 fL — ABNORMAL HIGH (ref 80.0–100.0)
Monocytes Absolute: 0.2 10*3/uL (ref 0.1–1.0)
Monocytes Relative: 9 %
Neutro Abs: 1.2 10*3/uL — ABNORMAL LOW (ref 1.7–7.7)
Neutrophils Relative %: 59 %
Platelets: 32 10*3/uL — ABNORMAL LOW (ref 150–400)
RBC: 2.87 MIL/uL — ABNORMAL LOW (ref 3.87–5.11)
RDW: 16.7 % — ABNORMAL HIGH (ref 11.5–15.5)
WBC: 2 10*3/uL — ABNORMAL LOW (ref 4.0–10.5)
nRBC: 0 % (ref 0.0–0.2)

## 2022-07-25 LAB — SAMPLE TO BLOOD BANK

## 2022-07-25 MED ORDER — HEPARIN SOD (PORK) LOCK FLUSH 100 UNIT/ML IV SOLN
500.0000 [IU] | Freq: Once | INTRAVENOUS | Status: AC
  Administered 2022-07-25: 500 [IU] via INTRAVENOUS
  Filled 2022-07-25: qty 5

## 2022-07-25 MED ORDER — SODIUM CHLORIDE 0.9% FLUSH
10.0000 mL | Freq: Once | INTRAVENOUS | Status: AC
  Administered 2022-07-25: 10 mL via INTRAVENOUS
  Filled 2022-07-25: qty 10

## 2022-07-25 NOTE — Telephone Encounter (Signed)
Called patient to inform her that she did not need blood or platelets tomorrow. She stated concerns about being off chemo the last 2 weeks and would like to proceed with treatment as soon as possible. Her next appointment for possible treatment is 6/5 but she does not want to wait that long. Please advise.

## 2022-07-25 NOTE — Telephone Encounter (Signed)
Spoke with patient and advised of Dr. Milinda Cave response. She expressed that it makes her "very nervous" to go this long without treatment. I told her I would relay her concerns but for now that is the recommendation.

## 2022-07-26 ENCOUNTER — Inpatient Hospital Stay: Payer: Medicare PPO

## 2022-07-26 LAB — SAMPLE TO BLOOD BANK

## 2022-07-27 ENCOUNTER — Other Ambulatory Visit: Payer: Self-pay | Admitting: Oncology

## 2022-07-27 DIAGNOSIS — C9000 Multiple myeloma not having achieved remission: Secondary | ICD-10-CM

## 2022-07-28 ENCOUNTER — Other Ambulatory Visit: Payer: Self-pay

## 2022-07-31 ENCOUNTER — Ambulatory Visit
Admission: RE | Admit: 2022-07-31 | Discharge: 2022-07-31 | Disposition: A | Discharge status: Home / Self Care | Payer: Medicare PPO | Source: Ambulatory Visit | Attending: Oncology | Admitting: Oncology

## 2022-07-31 ENCOUNTER — Inpatient Hospital Stay: Payer: Medicare PPO | Attending: Oncology

## 2022-07-31 DIAGNOSIS — C9001 Multiple myeloma in remission: Secondary | ICD-10-CM | POA: Diagnosis present

## 2022-07-31 DIAGNOSIS — Z87891 Personal history of nicotine dependence: Secondary | ICD-10-CM | POA: Insufficient documentation

## 2022-07-31 DIAGNOSIS — Z5112 Encounter for antineoplastic immunotherapy: Secondary | ICD-10-CM | POA: Insufficient documentation

## 2022-07-31 DIAGNOSIS — Z923 Personal history of irradiation: Secondary | ICD-10-CM | POA: Insufficient documentation

## 2022-07-31 DIAGNOSIS — C9000 Multiple myeloma not having achieved remission: Secondary | ICD-10-CM

## 2022-07-31 DIAGNOSIS — Z9484 Stem cells transplant status: Secondary | ICD-10-CM | POA: Diagnosis not present

## 2022-07-31 DIAGNOSIS — N289 Disorder of kidney and ureter, unspecified: Secondary | ICD-10-CM | POA: Insufficient documentation

## 2022-07-31 DIAGNOSIS — I700 Atherosclerosis of aorta: Secondary | ICD-10-CM | POA: Insufficient documentation

## 2022-07-31 DIAGNOSIS — Z79899 Other long term (current) drug therapy: Secondary | ICD-10-CM | POA: Diagnosis not present

## 2022-07-31 DIAGNOSIS — D696 Thrombocytopenia, unspecified: Secondary | ICD-10-CM | POA: Insufficient documentation

## 2022-07-31 DIAGNOSIS — Z95828 Presence of other vascular implants and grafts: Secondary | ICD-10-CM

## 2022-07-31 LAB — CBC WITH DIFFERENTIAL/PLATELET
Abs Immature Granulocytes: 0.01 10*3/uL (ref 0.00–0.07)
Basophils Absolute: 0 10*3/uL (ref 0.0–0.1)
Basophils Relative: 0 %
Eosinophils Absolute: 0.1 10*3/uL (ref 0.0–0.5)
Eosinophils Relative: 5 %
HCT: 30.8 % — ABNORMAL LOW (ref 36.0–46.0)
Hemoglobin: 10 g/dL — ABNORMAL LOW (ref 12.0–15.0)
Immature Granulocytes: 0 %
Lymphocytes Relative: 13 %
Lymphs Abs: 0.3 10*3/uL — ABNORMAL LOW (ref 0.7–4.0)
MCH: 34.2 pg — ABNORMAL HIGH (ref 26.0–34.0)
MCHC: 32.5 g/dL (ref 30.0–36.0)
MCV: 105.5 fL — ABNORMAL HIGH (ref 80.0–100.0)
Monocytes Absolute: 0.3 10*3/uL (ref 0.1–1.0)
Monocytes Relative: 13 %
Neutro Abs: 1.6 10*3/uL — ABNORMAL LOW (ref 1.7–7.7)
Neutrophils Relative %: 69 %
Platelets: 28 10*3/uL — ABNORMAL LOW (ref 150–400)
RBC: 2.92 MIL/uL — ABNORMAL LOW (ref 3.87–5.11)
RDW: 16.5 % — ABNORMAL HIGH (ref 11.5–15.5)
WBC: 2.3 10*3/uL — ABNORMAL LOW (ref 4.0–10.5)
nRBC: 0 % (ref 0.0–0.2)

## 2022-07-31 LAB — COMPREHENSIVE METABOLIC PANEL
ALT: 20 U/L (ref 0–44)
AST: 23 U/L (ref 15–41)
Albumin: 4 g/dL (ref 3.5–5.0)
Alkaline Phosphatase: 124 U/L (ref 38–126)
Anion gap: 6 (ref 5–15)
BUN: 19 mg/dL (ref 8–23)
CO2: 24 mmol/L (ref 22–32)
Calcium: 8.8 mg/dL — ABNORMAL LOW (ref 8.9–10.3)
Chloride: 109 mmol/L (ref 98–111)
Creatinine, Ser: 1.53 mg/dL — ABNORMAL HIGH (ref 0.44–1.00)
GFR, Estimated: 37 mL/min — ABNORMAL LOW (ref >60)
Glucose, Bld: 147 mg/dL — ABNORMAL HIGH (ref 70–99)
Potassium: 3.6 mmol/L (ref 3.5–5.1)
Sodium: 139 mmol/L (ref 135–145)
Total Bilirubin: 0.5 mg/dL (ref 0.3–1.2)
Total Protein: 6.5 g/dL (ref 6.5–8.1)

## 2022-07-31 LAB — GLUCOSE, CAPILLARY: Glucose-Capillary: 134 mg/dL — ABNORMAL HIGH (ref 70–99)

## 2022-07-31 MED ORDER — SODIUM CHLORIDE 0.9% FLUSH
10.0000 mL | Freq: Once | INTRAVENOUS | Status: AC
  Filled 2022-07-31: qty 10

## 2022-07-31 MED ORDER — FLUDEOXYGLUCOSE F - 18 (FDG) INJECTION
6.9000 | Freq: Once | INTRAVENOUS | Status: AC | PRN
  Administered 2022-07-31: 7.49 via INTRAVENOUS

## 2022-07-31 MED ORDER — HEPARIN SOD (PORK) LOCK FLUSH 100 UNIT/ML IV SOLN
INTRAVENOUS | Status: AC
  Filled 2022-07-31: qty 5

## 2022-07-31 MED ORDER — HEPARIN SOD (PORK) LOCK FLUSH 100 UNIT/ML IV SOLN
500.0000 [IU] | Freq: Once | INTRAVENOUS | Status: AC
  Filled 2022-07-31: qty 5

## 2022-08-01 LAB — KAPPA/LAMBDA LIGHT CHAINS
Kappa free light chain: 4.8 mg/L (ref 3.3–19.4)
Kappa, lambda light chain ratio: 1.04 (ref 0.26–1.65)
Lambda free light chains: 4.6 mg/L — ABNORMAL LOW (ref 5.7–26.3)

## 2022-08-02 ENCOUNTER — Encounter: Payer: Self-pay | Admitting: Oncology

## 2022-08-02 ENCOUNTER — Inpatient Hospital Stay (HOSPITAL_BASED_OUTPATIENT_CLINIC_OR_DEPARTMENT_OTHER): Payer: Medicare PPO | Admitting: Oncology

## 2022-08-02 ENCOUNTER — Inpatient Hospital Stay: Payer: Medicare PPO

## 2022-08-02 VITALS — BP 128/63 | HR 92 | Temp 98.6°F | Resp 18 | Ht 61.0 in | Wt 132.0 lb

## 2022-08-02 DIAGNOSIS — Z5112 Encounter for antineoplastic immunotherapy: Secondary | ICD-10-CM | POA: Diagnosis not present

## 2022-08-02 DIAGNOSIS — C9000 Multiple myeloma not having achieved remission: Secondary | ICD-10-CM | POA: Diagnosis not present

## 2022-08-02 NOTE — Progress Notes (Signed)
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  INTERVAL HISTORY: Patient returns to clinic today for repeat laboratory work, further evaluation, discussion of her PET scan results.  She has increased bruising, but otherwise feels well.  She has chronic left knee pain secondary to torn meniscus.  She continues to have back pain, but this is well-controlled on her current dose of tramadol.  The peripheral neuropathy in her feet is essentially unchanged.  She has no other neurologic complaints.  She denies any chest pain, shortness of breath, cough, or hemoptysis.  She denies any nausea, vomiting, constipation, or diarrhea.  She has no urinary complaints.  Patient no further specific complaints today.  REVIEW OF SYSTEMS:   Review of Systems  Constitutional: Negative.  Negative for fever, malaise/fatigue and weight loss.  HENT: Negative.  Negative for congestion and sinus pain.   Respiratory: Negative.  Negative for cough, hemoptysis and shortness of breath.   Cardiovascular:  Negative for chest pain and leg swelling.  Gastrointestinal: Negative.  Negative for abdominal pain, diarrhea and nausea.  Genitourinary: Negative.  Negative for dysuria.  Musculoskeletal:  Positive for back pain and joint pain. Negative for neck pain.  Skin: Negative.  Negative for rash.  Neurological:  Positive for sensory change. Negative for dizziness, tremors, focal weakness, weakness and headaches.  Endo/Heme/Allergies:  Bruises/bleeds easily.  Psychiatric/Behavioral: Negative.  The patient is not nervous/anxious.     As per HPI. Otherwise, a complete review of systems is negative.  PAST MEDICAL  HISTORY: Past Medical History:  Diagnosis Date   Anxiety    Diabetes mellitus without complication (HCC) 2016   GERD (gastroesophageal reflux disease)    Hyperlipidemia    Myeloma (HCC)    Myeloma (HCC)    Personal history of colonic polyps    Squamous cell carcinoma of skin 11/25/2013   Left chest. WD SCC with superficial infiltration.   Tachycardia     PAST SURGICAL HISTORY: Past Surgical History:  Procedure Laterality Date   AUGMENTATION MAMMAPLASTY Bilateral 1983   silicone and replacement in 2001   BREAST ENHANCEMENT SURGERY  1983   CESAREAN SECTION  1987, 1989   COLONOSCOPY  1988, 1998,2003, 2008, 2013   COLONOSCOPY WITH PROPOFOL N/A 08/16/2016   Procedure: COLONOSCOPY WITH PROPOFOL;  Surgeon: Earline Mayotte, MD;  Location: [facility] END[facility];  Service: Endoscopy;  Laterality: N/A;   COLONOSCOPY WITH PROPOFOL N/A 10/05/2021   Procedure: COLONOSCOPY WITH PROPOFOL;  Surgeon: Earline Mayotte, MD;  Location: [facility] ENDOSCOPY;  Service: Endoscopy;  Laterality: N/A;  HAS A PORT, NEEDS AMPICILLIN PER OFFICE   DILATION AND CURETTAGE OF UTERUS     ENDOMETRIAL ABLATION  12/1991   ganglion cyst removal      IR IMAGING GUIDED PORT INSERTION  10/27/2020   IR IMAGING GUIDED PORT INSERTION  04/04/2021   KYPHOPLASTY N/A 10/28/2020   Procedure: T12 and L3 KYPHOPLASTY;  Surgeon: Kennedy Bucker, MD;  Location: [facility] ORS;  Service: Orthopedics;  Laterality: N/A;   TONSILLECTOMY     WRIST SURGERY Right 05/1995    FAMILY HISTORY: Family History  Problem Relation Age of Onset   Colon polyps Sister    Colon cancer Father 27   Diabetes Mother  Breast cancer Neg Hx     ADVANCED DIRECTIVES (Y/N):  N  HEALTH MAINTENANCE: Social History   Tobacco Use   Smoking status: Former    Packs/day: 1.00    Years: 4.00    Additional pack years: 0.00    Total pack years: 4.00    Types: Cigarettes    Quit date: 59    Years since quitting: 42.4   Smokeless tobacco: Never  Vaping Use   Vaping  Use: Never used  Substance Use Topics   Alcohol use: Yes    Alcohol/week: 1.0 - 2.0 standard drink of alcohol    Types: 1 - 2 Glasses of wine per week    Comment: occassionally   Drug use: No     Colonoscopy:  PAP:  Bone density:  Lipid panel:  No Known Allergies  Current Outpatient Medications  Medication Sig Dispense Refill   acetaminophen (TYLENOL) 325 MG tablet Take 650 mg by mouth 2 (two) times a week. Premed for velcade. Taking Tuesdays and Friday     acyclovir (ZOVIRAX) 400 MG tablet TAKE 1 TABLET BY MOUTH TWICE DAILY 60 tablet 5   albuterol (VENTOLIN HFA) 108 (90 Base) MCG/ACT inhaler Inhale 2 puffs into the lungs every 4 (four) hours as needed.     ALPRAZolam (XANAX) 0.25 MG tablet TAKE 1 TABLET BY MOUTH AT BEDTIME AS NEEDED FOR SLEEP 30 tablet 2   ascorbic acid (VITAMIN C) 500 MG tablet Take 500 mg by mouth daily.     ASPIRIN 81 PO Take 81 mg by mouth daily.     atorvastatin (LIPITOR) 20 MG tablet Take 20 mg by mouth daily.     busPIRone (BUSPAR) 30 MG tablet Take 30 mg by mouth 2 (two) times daily.     CALCIUM-VITAMIN D PO Take by mouth 2 (two) times daily.     cyclobenzaprine (FLEXERIL) 10 MG tablet TAKE 1 TABLET BY MOUTH AT BEDTIME 30 tablet 2   dexamethasone (DECADRON) 4 MG tablet TAKE 5 TABLETS BY MOUTH ONCE A WEEK 20 tablet 3   diphenhydrAMINE (BENADRYL) 25 mg capsule Take 25 mg by mouth 2 (two) times a week. Pre med for velcade. Only Tuesdays and Fridays     diphenoxylate-atropine (LOMOTIL) 2.5-0.025 MG tablet Take by mouth as needed.     ferrous sulfate 325 (65 FE) MG tablet Take 325 mg by mouth daily with breakfast.     gabapentin (NEURONTIN) 300 MG capsule TAKE 1 CAPSULE BY MOUTH TWICE DAILY 60 capsule 2   ivabradine (CORLANOR) 5 MG TABS tablet Take 5 mg by mouth 2 (two) times daily with a meal.     lenalidomide (REVLIMID) 10 MG capsule TAKE 1 CAPSULE BY MOUTH 1 TIME A DAY FOR 21 DAYS ON THEN 7 DAYS OFF 21 capsule 0   lidocaine-prilocaine (EMLA) cream APPLY 1  APPLICATION TOPICALLY AS NEEDED 30 g 2   liraglutide (VICTOZA) 18 MG/3ML SOPN Inject 1.2 mg into the skin daily.     Magnesium 400 MG TABS Take 1 tablet by mouth daily.     meloxicam (MOBIC) 15 MG tablet Take 15 mg by mouth daily.     methocarbamol (ROBAXIN) 500 MG tablet Take 1 tablet (500 mg total) by mouth every 6 (six) hours as needed for muscle spasms. 40 tablet 1   Multiple Vitamin (MULTI-VITAMIN) tablet Take 1 tablet by mouth daily.     omeprazole (PRILOSEC) 40 MG capsule Take 40 mg by mouth daily.     ondansetron (ZOFRAN) 8 MG  tablet Take 1 tablet (8 mg total) by mouth 2 (two) times daily as needed (Nausea or vomiting). 60 tablet 2   oxyCODONE-acetaminophen (PERCOCET/ROXICET) 5-325 MG tablet TAKE 1-2 TABLETS BY MOUTH EVERY 4 HOURS AS NEEDED FOR SEVERE PAIN 90 tablet 0   prochlorperazine (COMPAZINE) 10 MG tablet TAKE 1 TABLET BY MOUTH EVERY 6 HOURS AS NEEDED FOR NAUSEA OR VOMITING 60 tablet 2   traMADol (ULTRAM) 50 MG tablet TAKE 1 TABLET BY MOUTH EVERY 6 HOURS AS NEEDED FOR PAIN 60 tablet 2   No current facility-administered medications for this visit.   Facility-Administered Medications Ordered in Other Visits  Medication Dose Route Frequency Provider Last Rate Last Admin   0.9 %  sodium chloride infusion   Intravenous Continuous Jeralyn Ruths, MD   Stopped at 04/07/21 1142   heparin lock flush 100 unit/mL  500 Units Intravenous Once Jeralyn Ruths, MD       sodium chloride flush (NS) 0.9 % injection 10 mL  10 mL Intravenous PRN Jeralyn Ruths, MD   10 mL at 11/23/20 0932   sodium chloride flush (NS) 0.9 % injection 10 mL  10 mL Intravenous PRN Jeralyn Ruths, MD   10 mL at 04/18/22 1417   sodium chloride flush (NS) 0.9 % injection 10 mL  10 mL Intravenous Once Jeralyn Ruths, MD        OBJECTIVE: Vitals:   08/02/22 1014  BP: 128/63  Pulse: 92  Resp: 18  Temp: 98.6 F (37 C)  SpO2: 100%     Body mass index is 24.94 kg/m.    ECOG FS:1 - Symptomatic  but completely ambulatory  General: Well-developed, well-nourished, no acute distress. Eyes: Pink conjunctiva, anicteric sclera. HEENT: Normocephalic, moist mucous membranes. Lungs: No audible wheezing or coughing. Heart: Regular rate and rhythm. Abdomen: Soft, nontender, no obvious distention. Musculoskeletal: No edema, cyanosis, or clubbing. Neuro: Alert, answering all questions appropriately. Cranial nerves grossly intact. Skin: No rashes or petechiae noted. Psych: Normal affect.  LAB RESULTS:  Lab Results  Component Value Date   NA 139 07/31/2022   K 3.6 07/31/2022   CL 109 07/31/2022   CO2 24 07/31/2022   GLUCOSE 147 (H) 07/31/2022   BUN 19 07/31/2022   CREATININE 1.53 (H) 07/31/2022   CALCIUM 8.8 (L) 07/31/2022   PROT 6.5 07/31/2022   ALBUMIN 4.0 07/31/2022   AST 23 07/31/2022   ALT 20 07/31/2022   ALKPHOS 124 07/31/2022   BILITOT 0.5 07/31/2022   GFRNONAA 37 (L) 07/31/2022    Lab Results  Component Value Date   WBC 2.3 (L) 07/31/2022   NEUTROABS 1.6 (L) 07/31/2022   HGB 10.0 (L) 07/31/2022   HCT 30.8 (L) 07/31/2022   MCV 105.5 (H) 07/31/2022   PLT 28 (L) 07/31/2022     STUDIES: NM PET Image Restage (PS) Whole Body  Result Date: 08/01/2022 CLINICAL DATA:  Subsequent treatment strategy for myeloma. EXAM: NUCLEAR MEDICINE PET WHOLE BODY TECHNIQUE: 7.5 mCi F-18 FDG was injected intravenously. Full-ring PET imaging was performed from the head to foot after the radiotracer. CT data was obtained and used for attenuation correction and anatomic localization. Fasting blood glucose: 134 mg/dl COMPARISON:  16/11/9602 FINDINGS: Mediastinal blood pool activity: SUV max 2.0 HEAD/NECK: No significant abnormal hypermetabolic activity in this region. Incidental CT findings: none CHEST: Previous left lower lobe lung lesion has resolved. Generalized accentuated Incidental CT findings: Right Port-A-Cath tip: SVC. Bilateral breast implants. ABDOMEN/PELVIS: Physiologic activity in  bowel. Incidental CT findings:  Mild abdominal aortic atherosclerotic vascular calcification. SKELETON: Widespread low-grade activity throughout the skeleton. Similar regions of accentuated metabolic activity along the cervical spine including left facet activity about the C4-5 level with maximum SUV 8.8, formerly 7.6. This is so seated with degenerative facet findings. Foci of activity associated with old fractures in the left ribs including a left eighth rib fracture with maximum SUV 5.3 compared to contralateral rib which has same maximum SUV of 2.0. There also some old right rib fractures with faintly accentuated activity. Prior vertebral augmentations at T11 and L3. Generalized reduced activity at the L3 level may be therapy related. Abnormal lucency and some cortical loss of the right iliac crest similar to previous, slightly reduced metabolic activity compared to the normal appearing contralateral side. Small foci of degenerative activity associated with degenerative lesions in the feet. Incidental CT findings: Cervical and thoracic spondylosis. EXTREMITIES: No significant abnormal hypermetabolic activity in this region. Incidental CT findings: No significant abnormal hypermetabolic activity in this region. IMPRESSION: 1. Widespread low-grade activity throughout the skeleton, probably from granulocyte/marrow stimulation. 2. Similar regions of accentuated metabolic activity along the cervical spine including left facet activity about the C4-5 level. 3. Abnormal lucency and cortical loss of the right iliac crest similar to previous, slightly reduced metabolic activity compared to the normal appearing contralateral side. 4. Previous left lower lobe lung lesion has resolved. 5. Old bilateral rib fractures with some accentuated metabolic activity. 6. Prior vertebral augmentations at T11 and L3. Generalized reduced activity at the L3 level may be therapy related. 7. No new or progressive lesions. 8. Aortic  atherosclerosis. Aortic Atherosclerosis (ICD10-I70.0). Electronically Signed   By: Gaylyn Rong M.D.   On: 08/01/2022 12:07    ONCOLOGY HISTORY:  Diagnosis confirmed from bone biopsy on August 17, 2020.  Initially her lambda free light chains were elevated at 432.1, but now are within normal limits at 12.1.  Immunoglobulins and SPEP are normal.  Bone marrow biopsy completed on August 26, 2020 revealed 25% plasma cells along with the deletion of 1p which is considered poor prognosis.  PET scan results from September 06, 2020 reviewed independently with 3 hypermetabolic lesions in right C5-6 facets, right iliac crest lesion, and L3 vertebral lesion.  After lengthy discussion with the patient, she agreed to pursue chemotherapy with daratumumab, Velcade, Revlimid, and dexamethasone followed by autologous bone marrow transplant.  Patient received weekly daratumumab for 4 cycles along with Velcade on days 1, 4, 8, and 11.  She also received Revlimid on days 1 through 14 of a 21-day cycle.  She completed cycle 4 of treatment on January 14, 2021.  Subsequent bone marrow biopsy on January 24, 2021 revealed complete remission with no evidence of myeloma.  Patient underwent autologous stem cell transplant at [facility] on February 24, 2021.  She was readmitted to the hospital on March 04, 2021 with neutropenic fever, diarrhea, and sepsis-like symptoms.  Infectious disease work-up was negative.   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  PLAN:      Lambda light chain myeloma with 1p del:  Patient now using consolidation treatment with the GRIFFIN regimen.  She received consolidation with cycle 5 and 6 using daratumumab on day 1, Velcade on day 1, 4, 8, and 11, Revlimid 10 mg on days 1 through 14 with 7 days off, and weekly dexamethasone on a 21-day cycle.  She is now on maintenance daratumumab on day 1 and Revlimid 10 mg on days 1 through  21 on a 28-day cycle for cycles 7 through  32.  Repeat bone marrow biopsy on October 03, 2021 revealed no evidence of a myeloid neoplasm.  Her most recent PET scan on July 31, 2022 reviewed independently and report as above with no obvious evidence of recurrent disease.  Continue to hold treatment secondary to pancytopenia.  Patient has a bone marrow biopsy scheduled next week.  Return to clinic as previously scheduled on June 18 for further evaluation, discussion of her biopsy results, and reinitiation of treatment.  Patient takes all of her premedications at home prior to clinic, therefore these have been removed from the treatment plan.   Pain: Well-controlled.  She has completed XRT.  She had kyphoplasty x2.  Continue tramadol.  Patient only uses Percocet sparingly.  Repeat MRI results with essentially no change.  PET scan results as above.  Appreciate orthopedics input who recommended conservative management.   Hypocalcemia: Chronic and unchanged.  Proceed with Zometa. Renal insufficiency: C chronic and unchanged.  Patient creatinine is 1.53.   Anemia: Patient's hemoglobin improved to 10.0 with 1 unit packed red blood cells.  Continue to hold Revlimid.  Can consider a dose reduction of Revlimid in the future if necessary. Thrombocytopenia: Platelets continue to trend down and now 28.  Bone marrow biopsy as above.  Hold treatment as above. Neutropenia: Chronic and unchanged.  Monitor. Vaccinations: Continue revaccination schedule as per [facility]. Pulmonary nodule: Patient noted to have a 9 mm hypermetabolic pulmonary nodule in her left lower lobe lung.  Possibly infectious, continue to monitor closely. Left knee pain: Continue conservative management.  Patient expressed understanding and was in agreement with this plan. She also understands that She can call clinic at any time with any questions, concerns, or complaints.      Cancer Staging  Lambda light chain myeloma (HCC) Staging form: Plasma Cell Myeloma and Plasma Cell Disorders,  AJCC 8th Edition - Clinical stage from 09/16/2020: RISS Stage I (Beta-2-microglobulin (mg/L): 2.5, Albumin (g/dL): 4.9, ISS: Stage I, High-risk cytogenetics: Absent, LDH: Normal) - Signed by Jeralyn Ruths, MD on 09/16/2020 Beta 2 microglobulin range (mg/L): Less than 3.5 Albumin range (g/dL): Greater than or equal to 3.5 Cytogenetics: 1p deletion  Jeralyn Ruths, MD   08/02/2022 3:31 PM

## 2022-08-03 ENCOUNTER — Inpatient Hospital Stay: Payer: Medicare PPO

## 2022-08-04 ENCOUNTER — Other Ambulatory Visit: Payer: Self-pay

## 2022-08-07 ENCOUNTER — Ambulatory Visit: Payer: Medicare PPO | Admitting: Radiology

## 2022-08-07 ENCOUNTER — Other Ambulatory Visit: Payer: Self-pay | Admitting: Radiology

## 2022-08-07 DIAGNOSIS — C9000 Multiple myeloma not having achieved remission: Secondary | ICD-10-CM

## 2022-08-07 NOTE — Progress Notes (Signed)
Patient for IR Bone Marrow Biopsy on Tues 08/08/2022, I called and spoke with the patient on the phone and gave pre-procedure instructions. Pt was made aware to be here at 7:30a, NPO after MN prior to procedure as well as driver post procedure/recovery/discharge. Pt stated understanding.  Called 08/07/2022

## 2022-08-07 NOTE — H&P (Signed)
Chief Complaint: Patient was seen in consultation today for myeloma at the request of Allen,Lauren G  Referring Physician(s): Allen,Lauren G  Supervising Physician: Gilmer Mor  Patient Status: [facility] - Out-pt  History of Present Illness:  Hannah Mcdonald is a 67 y.o. female followed by oncology for plasma cell myeloma diagnosed July 2022 for which she is currently undergoing treatment.  Most recent labs show worsening anemia with a hemoglobin of 7.5, thrombocytopenia, neutropenia.  PET scan 07/31/2022 demonstrated no obvious evidence of recurrent disease.  Patient's current treatment being held secondary to pancytopenia.  Patient has been referred to IR for bone marrow biopsy for evaluation.   Patient denies chills, fever, SOB, CP, leg swelling, abdominal pain, vomiting, dizziness, headaches or weakness. She endorses fatigue, loss of appetite, weight loss, nausea and diarrhea. She is n.p.o. per order.  Past Medical History:  Diagnosis Date   Anxiety    Diabetes mellitus without complication (HCC) 2016   GERD (gastroesophageal reflux disease)    Hyperlipidemia    Myeloma (HCC)    Myeloma (HCC)    Personal history of colonic polyps    Squamous cell carcinoma of skin 11/25/2013   Left chest. WD SCC with superficial infiltration.   Tachycardia     Past Surgical History:  Procedure Laterality Date   AUGMENTATION MAMMAPLASTY Bilateral 1983   silicone and replacement in 2001   BREAST ENHANCEMENT SURGERY  1983   CESAREAN SECTION  1987, 1989   COLONOSCOPY  1988, 1998,2003, 2008, 2013   COLONOSCOPY WITH PROPOFOL N/A 08/16/2016   Procedure: COLONOSCOPY WITH PROPOFOL;  Surgeon: Earline Mayotte, MD;  Location: [facility] ENDOSCOPY;  Service: Endoscopy;  Laterality: N/A;   COLONOSCOPY WITH PROPOFOL N/A 10/05/2021   Procedure: COLONOSCOPY WITH PROPOFOL;  Surgeon: Earline Mayotte, MD;  Location: [facility] ENDOSCOPY;  Service: Endoscopy;  Laterality: N/A;  HAS A PORT, NEEDS AMPICILLIN PER  OFFICE   DILATION AND CURETTAGE OF UTERUS     ENDOMETRIAL ABLATION  12/1991   ganglion cyst removal      IR IMAGING GUIDED PORT INSERTION  10/27/2020   IR IMAGING GUIDED PORT INSERTION  04/04/2021   KYPHOPLASTY N/A 10/28/2020   Procedure: T12 and L3 KYPHOPLASTY;  Surgeon: Kennedy Bucker, MD;  Location: [facility] ORS;  Service: Orthopedics;  Laterality: N/A;   TONSILLECTOMY     WRIST SURGERY Right 05/1995    Allergies: Patient has no known allergies.  Medications: Prior to Admission medications   Medication Sig Start Date End Date Taking? Authorizing Provider  acetaminophen (TYLENOL) 325 MG tablet Take 650 mg by mouth 2 (two) times a week. Premed for velcade. Taking Tuesdays and Friday    [provider]  acyclovir (ZOVIRAX) 400 MG tablet TAKE 1 TABLET BY MOUTH TWICE DAILY 02/21/22   Jeralyn Ruths, MD  albuterol (VENTOLIN HFA) 108 (90 Base) MCG/ACT inhaler Inhale 2 puffs into the lungs every 4 (four) hours as needed. 02/10/22   [provider]  ALPRAZolam Prudy Feeler) 0.25 MG tablet TAKE 1 TABLET BY MOUTH AT BEDTIME AS NEEDED FOR SLEEP 05/30/22   Jeralyn Ruths, MD  ascorbic acid (VITAMIN C) 500 MG tablet Take 500 mg by mouth daily.    [provider]  ASPIRIN 81 PO Take 81 mg by mouth daily. 06/21/21   [provider]  atorvastatin (LIPITOR) 20 MG tablet Take 20 mg by mouth daily.    [provider]  busPIRone (BUSPAR) 30 MG tablet Take 30 mg by mouth 2 (two) times daily.  [provider]  CALCIUM-VITAMIN D PO Take by mouth 2 (two) times daily.    [provider]  cyclobenzaprine (FLEXERIL) 10 MG tablet TAKE 1 TABLET BY MOUTH AT BEDTIME 02/18/21   Mauro Kaufmann, NP  dexamethasone (DECADRON) 4 MG tablet TAKE 5 TABLETS BY MOUTH ONCE A WEEK 12/22/21   Jeralyn Ruths, MD  diphenhydrAMINE (BENADRYL) 25 mg capsule Take 25 mg by mouth 2 (two) times a week. Pre med for velcade. Only Tuesdays and Fridays    [provider]   diphenoxylate-atropine (LOMOTIL) 2.5-0.025 MG tablet Take by mouth as needed. 03/16/21   [provider]  ferrous sulfate 325 (65 FE) MG tablet Take 325 mg by mouth daily with breakfast.    [provider]  gabapentin (NEURONTIN) 300 MG capsule TAKE 1 CAPSULE BY MOUTH TWICE DAILY 07/04/22   Jeralyn Ruths, MD  ivabradine (CORLANOR) 5 MG TABS tablet Take 5 mg by mouth 2 (two) times daily with a meal.    [provider]  lenalidomide (REVLIMID) 10 MG capsule TAKE 1 CAPSULE BY MOUTH 1 TIME A DAY FOR 21 DAYS ON THEN 7 DAYS OFF 07/27/22   Jeralyn Ruths, MD  lidocaine-prilocaine (EMLA) cream APPLY 1 APPLICATION TOPICALLY AS NEEDED 05/30/22   Jeralyn Ruths, MD  liraglutide (VICTOZA) 18 MG/3ML SOPN Inject 1.2 mg into the skin daily.    [provider]  Magnesium 400 MG TABS Take 1 tablet by mouth daily.    [provider]  meloxicam (MOBIC) 15 MG tablet Take 15 mg by mouth daily.    [provider]  methocarbamol (ROBAXIN) 500 MG tablet Take 1 tablet (500 mg total) by mouth every 6 (six) hours as needed for muscle spasms. 10/28/20   Kennedy Bucker, MD  Multiple Vitamin (MULTI-VITAMIN) tablet Take 1 tablet by mouth daily.    [provider]  omeprazole (PRILOSEC) 40 MG capsule Take 40 mg by mouth daily.    [provider]  ondansetron (ZOFRAN) 8 MG tablet Take 1 tablet (8 mg total) by mouth 2 (two) times daily as needed (Nausea or vomiting). 10/19/20   Jeralyn Ruths, MD  oxyCODONE-acetaminophen (PERCOCET/ROXICET) 5-325 MG tablet TAKE 1-2 TABLETS BY MOUTH EVERY 4 HOURS AS NEEDED FOR SEVERE PAIN 07/19/22   Jeralyn Ruths, MD  prochlorperazine (COMPAZINE) 10 MG tablet TAKE 1 TABLET BY MOUTH EVERY 6 HOURS AS NEEDED FOR NAUSEA OR VOMITING 07/06/21   Jeralyn Ruths, MD  traMADol (ULTRAM) 50 MG tablet TAKE 1 TABLET BY MOUTH EVERY 6 HOURS AS NEEDED FOR PAIN 07/04/22   Jeralyn Ruths, MD     Family History  Problem  Relation Age of Onset   Colon polyps Sister    Colon cancer Father 44   Diabetes Mother    Breast cancer Neg Hx     Social History   Socioeconomic History   Marital status: Married    Spouse name: Not on file   Number of children: Not on file   Years of education: Not on file   Highest education level: Not on file  Occupational History   Not on file  Tobacco Use   Smoking status: Former    Packs/day: 1.00    Years: 4.00    Additional pack years: 0.00    Total pack years: 4.00    Types: Cigarettes    Quit date: 42    Years since quitting: 42.4   Smokeless tobacco: Never  Vaping Use  Vaping Use: Never used  Substance and Sexual Activity   Alcohol use: Yes    Alcohol/week: 1.0 - 2.0 standard drink of alcohol    Types: 1 - 2 Glasses of wine per week    Comment: occassionally   Drug use: No   Sexual activity: Not Currently  Other Topics Concern   Not on file  Social History Narrative   Not on file   Social Determinants of Health   Financial Resource Strain: Not on file  Food Insecurity: Not on file  Transportation Needs: Not on file  Physical Activity: Not on file  Stress: Not on file  Social Connections: Not on file    Review of Systems: A 12 point ROS discussed and pertinent positives are indicated in the HPI above.  All other systems are negative.  Review of Systems  Constitutional:  Positive for appetite change, fatigue and unexpected weight change. Negative for chills and fever.  Respiratory:  Negative for shortness of breath.   Cardiovascular:  Negative for chest pain and leg swelling.  Gastrointestinal:  Positive for diarrhea and nausea. Negative for abdominal pain and vomiting.  Neurological:  Negative for dizziness, weakness and headaches.    Vital Signs: There were no vitals taken for this visit.  Advance Care Plan: The advanced care plan/surrogate decision maker was discussed at the time of visit and documented in the medical record.  Patient  is a full CODE STATUS  Physical Exam Vitals reviewed.  Constitutional:      General: She is not in acute distress.    Appearance: Normal appearance. She is not ill-appearing.  HENT:     Head: Normocephalic.     Mouth/Throat:     Mouth: Mucous membranes are dry.     Pharynx: Oropharynx is clear.  Eyes:     Extraocular Movements: Extraocular movements intact.     Pupils: Pupils are equal, round, and reactive to light.  Cardiovascular:     Rate and Rhythm: Normal rate and regular rhythm.     Pulses: Normal pulses.     Heart sounds: Normal heart sounds.  Pulmonary:     Effort: Pulmonary effort is normal. No respiratory distress.     Breath sounds: Normal breath sounds.  Abdominal:     General: Bowel sounds are normal. There is no distension.     Palpations: Abdomen is soft.     Tenderness: There is no abdominal tenderness. There is no guarding.  Musculoskeletal:     Right lower leg: No edema.     Left lower leg: No edema.  Skin:    General: Skin is warm and dry.  Neurological:     Mental Status: She is alert and oriented to person, place, and time.  Psychiatric:        Mood and Affect: Mood normal.        Behavior: Behavior normal.        Thought Content: Thought content normal.        Judgment: Judgment normal.     Imaging: NM PET Image Restage (PS) Whole Body  Result Date: 08/01/2022 CLINICAL DATA:  Subsequent treatment strategy for myeloma. EXAM: NUCLEAR MEDICINE PET WHOLE BODY TECHNIQUE: 7.5 mCi F-18 FDG was injected intravenously. Full-ring PET imaging was performed from the head to foot after the radiotracer. CT data was obtained and used for attenuation correction and anatomic localization. Fasting blood glucose: 134 mg/dl COMPARISON:  08/65/7846 FINDINGS: Mediastinal blood pool activity: SUV max 2.0 HEAD/NECK: No significant abnormal hypermetabolic activity  in this region. Incidental CT findings: none CHEST: Previous left lower lobe lung lesion has resolved. Generalized  accentuated Incidental CT findings: Right Port-A-Cath tip: SVC. Bilateral breast implants. ABDOMEN/PELVIS: Physiologic activity in bowel. Incidental CT findings: Mild abdominal aortic atherosclerotic vascular calcification. SKELETON: Widespread low-grade activity throughout the skeleton. Similar regions of accentuated metabolic activity along the cervical spine including left facet activity about the C4-5 level with maximum SUV 8.8, formerly 7.6. This is so seated with degenerative facet findings. Foci of activity associated with old fractures in the left ribs including a left eighth rib fracture with maximum SUV 5.3 compared to contralateral rib which has same maximum SUV of 2.0. There also some old right rib fractures with faintly accentuated activity. Prior vertebral augmentations at T11 and L3. Generalized reduced activity at the L3 level may be therapy related. Abnormal lucency and some cortical loss of the right iliac crest similar to previous, slightly reduced metabolic activity compared to the normal appearing contralateral side. Small foci of degenerative activity associated with degenerative lesions in the feet. Incidental CT findings: Cervical and thoracic spondylosis. EXTREMITIES: No significant abnormal hypermetabolic activity in this region. Incidental CT findings: No significant abnormal hypermetabolic activity in this region. IMPRESSION: 1. Widespread low-grade activity throughout the skeleton, probably from granulocyte/marrow stimulation. 2. Similar regions of accentuated metabolic activity along the cervical spine including left facet activity about the C4-5 level. 3. Abnormal lucency and cortical loss of the right iliac crest similar to previous, slightly reduced metabolic activity compared to the normal appearing contralateral side. 4. Previous left lower lobe lung lesion has resolved. 5. Old bilateral rib fractures with some accentuated metabolic activity. 6. Prior vertebral augmentations at T11  and L3. Generalized reduced activity at the L3 level may be therapy related. 7. No new or progressive lesions. 8. Aortic atherosclerosis. Aortic Atherosclerosis (ICD10-I70.0). Electronically Signed   By: Gaylyn Rong M.D.   On: 08/01/2022 12:07    Labs:  CBC: Recent Labs    06/28/22 0955 07/18/22 1311 07/25/22 0851 07/31/22 0836  WBC 3.1* 1.9* 2.0* 2.3*  HGB 8.4* 7.5* 9.7* 10.0*  HCT 26.6* 24.0* 30.6* 30.8*  PLT 38* 31* 32* 28*    COAGS: No results for input(s): "INR", "APTT" in the last 8760 hours.  BMP: Recent Labs    06/20/22 0929 06/28/22 0955 07/18/22 1311 07/31/22 0836  NA 135 135 136 139  K 3.5 3.0* 4.2 3.6  CL 106 105 107 109  CO2 20* 22 19* 24  GLUCOSE 296* 180* 219* 147*  BUN 13 16 17 19   CALCIUM 8.4* 8.3* 8.5* 8.8*  CREATININE 1.48* 1.19* 1.60* 1.53*  GFRNONAA 39* 50* 35* 37*    LIVER FUNCTION TESTS: Recent Labs    06/20/22 0929 06/28/22 0955 07/18/22 1311 07/31/22 0836  BILITOT 0.4 0.4 0.8 0.5  AST 28 29 24 23   ALT 13 18 14 20   ALKPHOS 80 98 102 124  PROT 6.1* 6.3* 5.8* 6.5  ALBUMIN 3.6 3.8 3.7 4.0    TUMOR MARKERS: No results for input(s): "AFPTM", "CEA", "CA199", "CHROMGRNA" in the last 8760 hours.  Assessment and Plan:  67 year old female with PMHx significant for anxiety, DM type II, GERD, HLD, myeloma and tachycardia presents to IR for bone marrow biopsy and aspiration.  Patient resting in bed with husband at bedside. She is alert and oriented, calm and pleasant. She is in no distress.  Risks and benefits of bone marrow biopsy and aspiration with moderate sedation was discussed with the patient and/or patient's family  including, but not limited to bleeding, infection, damage to adjacent structures or low yield requiring additional tests.  All of the questions were answered and there is agreement to proceed.  Consent signed and in chart.  Thank you for this interesting consult.  I greatly enjoyed meeting Hannah Mcdonald and  look forward to participating in their care.  A copy of this report was sent to the requesting provider on this date.  Electronically Signed: Shon Hough, NP 08/07/2022, 5:49 PM   I spent a total of 20 minutes in face to face in clinical consultation, greater than 50% of which was counseling/coordinating care for myeloma.

## 2022-08-08 ENCOUNTER — Ambulatory Visit
Admission: RE | Admit: 2022-08-08 | Discharge: 2022-08-08 | Disposition: A | Discharge status: Home / Self Care | Payer: Medicare PPO | Source: Ambulatory Visit | Attending: Nurse Practitioner | Admitting: Nurse Practitioner

## 2022-08-08 ENCOUNTER — Other Ambulatory Visit: Payer: Self-pay

## 2022-08-08 ENCOUNTER — Encounter: Payer: Self-pay | Admitting: Radiology

## 2022-08-08 DIAGNOSIS — F419 Anxiety disorder, unspecified: Secondary | ICD-10-CM | POA: Diagnosis not present

## 2022-08-08 DIAGNOSIS — E785 Hyperlipidemia, unspecified: Secondary | ICD-10-CM | POA: Insufficient documentation

## 2022-08-08 DIAGNOSIS — K219 Gastro-esophageal reflux disease without esophagitis: Secondary | ICD-10-CM | POA: Insufficient documentation

## 2022-08-08 DIAGNOSIS — D61818 Other pancytopenia: Secondary | ICD-10-CM | POA: Insufficient documentation

## 2022-08-08 DIAGNOSIS — E119 Type 2 diabetes mellitus without complications: Secondary | ICD-10-CM | POA: Insufficient documentation

## 2022-08-08 DIAGNOSIS — D709 Neutropenia, unspecified: Secondary | ICD-10-CM | POA: Insufficient documentation

## 2022-08-08 DIAGNOSIS — D649 Anemia, unspecified: Secondary | ICD-10-CM | POA: Insufficient documentation

## 2022-08-08 DIAGNOSIS — C9000 Multiple myeloma not having achieved remission: Secondary | ICD-10-CM | POA: Insufficient documentation

## 2022-08-08 DIAGNOSIS — D696 Thrombocytopenia, unspecified: Secondary | ICD-10-CM | POA: Diagnosis not present

## 2022-08-08 HISTORY — PX: IR BONE MARROW BIOPSY & ASPIRATION: IMG5727

## 2022-08-08 LAB — CBC WITH DIFFERENTIAL/PLATELET
Abs Immature Granulocytes: 0.01 10*3/uL (ref 0.00–0.07)
Basophils Absolute: 0 10*3/uL (ref 0.0–0.1)
Basophils Relative: 1 %
Eosinophils Absolute: 0.1 10*3/uL (ref 0.0–0.5)
Eosinophils Relative: 3 %
HCT: 30.9 % — ABNORMAL LOW (ref 36.0–46.0)
Hemoglobin: 10.2 g/dL — ABNORMAL LOW (ref 12.0–15.0)
Immature Granulocytes: 1 %
Lymphocytes Relative: 12 %
Lymphs Abs: 0.3 10*3/uL — ABNORMAL LOW (ref 0.7–4.0)
MCH: 35.1 pg — ABNORMAL HIGH (ref 26.0–34.0)
MCHC: 33 g/dL (ref 30.0–36.0)
MCV: 106.2 fL — ABNORMAL HIGH (ref 80.0–100.0)
Monocytes Absolute: 0.2 10*3/uL (ref 0.1–1.0)
Monocytes Relative: 11 %
Neutro Abs: 1.6 10*3/uL — ABNORMAL LOW (ref 1.7–7.7)
Neutrophils Relative %: 72 %
Platelets: 20 10*3/uL — CL (ref 150–400)
RBC: 2.91 MIL/uL — ABNORMAL LOW (ref 3.87–5.11)
RDW: 16.2 % — ABNORMAL HIGH (ref 11.5–15.5)
Smear Review: NORMAL
WBC: 2.2 10*3/uL — ABNORMAL LOW (ref 4.0–10.5)
nRBC: 0 % (ref 0.0–0.2)

## 2022-08-08 LAB — GLUCOSE, CAPILLARY: Glucose-Capillary: 135 mg/dL — ABNORMAL HIGH (ref 70–99)

## 2022-08-08 MED ORDER — HEPARIN SOD (PORK) LOCK FLUSH 100 UNIT/ML IV SOLN
INTRAVENOUS | Status: AC
  Filled 2022-08-08: qty 5

## 2022-08-08 MED ORDER — SODIUM CHLORIDE 0.9 % IV SOLN: INTRAVENOUS | Status: DC

## 2022-08-08 MED ORDER — MIDAZOLAM HCL 2 MG/2ML IJ SOLN
INTRAMUSCULAR | Status: AC
  Filled 2022-08-08: qty 2

## 2022-08-08 MED ORDER — FENTANYL CITRATE (PF) 100 MCG/2ML IJ SOLN
INTRAMUSCULAR | Status: AC
  Filled 2022-08-08: qty 2

## 2022-08-08 MED ORDER — FENTANYL CITRATE (PF) 100 MCG/2ML IJ SOLN
INTRAMUSCULAR | Status: AC | PRN
  Administered 2022-08-08: 50 ug via INTRAVENOUS
  Administered 2022-08-08: 25 ug via INTRAVENOUS

## 2022-08-08 MED ORDER — MIDAZOLAM HCL 5 MG/5ML IJ SOLN
INTRAMUSCULAR | Status: AC | PRN
  Administered 2022-08-08: 1 mg via INTRAVENOUS
  Administered 2022-08-08: .5 mg via INTRAVENOUS

## 2022-08-08 NOTE — Progress Notes (Signed)
Patient clinically stable post BMB per Dr Loreta Ave, tolerated well. Vitals stable pre and post procedure. Received Versed 1.5 mg along with Fentanyl 75 mcg IV for procedure. Report given to [facility] RN post procedure/18/specials.

## 2022-08-08 NOTE — Procedures (Signed)
Interventional Radiology Procedure Note  Procedure: CT guided aspirate and core biopsy of left iliac bone Complications: None Recommendations: - Bedrest supine x 1 hrs - OTC's PRN  Pain - Follow biopsy results - DC when goals met  Signed,  Yvone Neu. Loreta Ave, DO

## 2022-08-09 ENCOUNTER — Other Ambulatory Visit: Payer: Self-pay | Admitting: *Deleted

## 2022-08-09 ENCOUNTER — Encounter: Payer: Self-pay | Admitting: Oncology

## 2022-08-09 DIAGNOSIS — E7849 Other hyperlipidemia: Secondary | ICD-10-CM

## 2022-08-09 DIAGNOSIS — Z95828 Presence of other vascular implants and grafts: Secondary | ICD-10-CM

## 2022-08-09 NOTE — Progress Notes (Signed)
Hbg a1

## 2022-08-10 LAB — SURGICAL PATHOLOGY

## 2022-08-14 ENCOUNTER — Inpatient Hospital Stay: Payer: Medicare PPO

## 2022-08-14 ENCOUNTER — Other Ambulatory Visit: Payer: Self-pay | Admitting: Nurse Practitioner

## 2022-08-14 ENCOUNTER — Other Ambulatory Visit: Payer: Self-pay | Admitting: *Deleted

## 2022-08-14 DIAGNOSIS — C9000 Multiple myeloma not having achieved remission: Secondary | ICD-10-CM

## 2022-08-14 DIAGNOSIS — E7849 Other hyperlipidemia: Secondary | ICD-10-CM

## 2022-08-14 DIAGNOSIS — Z95828 Presence of other vascular implants and grafts: Secondary | ICD-10-CM

## 2022-08-14 DIAGNOSIS — Z5112 Encounter for antineoplastic immunotherapy: Secondary | ICD-10-CM | POA: Diagnosis not present

## 2022-08-14 LAB — CBC WITH DIFFERENTIAL/PLATELET
Abs Immature Granulocytes: 0.01 10*3/uL (ref 0.00–0.07)
Basophils Absolute: 0 10*3/uL (ref 0.0–0.1)
Basophils Relative: 0 %
Eosinophils Absolute: 0.1 10*3/uL (ref 0.0–0.5)
Eosinophils Relative: 3 %
HCT: 25 % — ABNORMAL LOW (ref 36.0–46.0)
Hemoglobin: 8.3 g/dL — ABNORMAL LOW (ref 12.0–15.0)
Immature Granulocytes: 0 %
Lymphocytes Relative: 20 %
Lymphs Abs: 0.6 10*3/uL — ABNORMAL LOW (ref 0.7–4.0)
MCH: 35.2 pg — ABNORMAL HIGH (ref 26.0–34.0)
MCHC: 33.2 g/dL (ref 30.0–36.0)
MCV: 105.9 fL — ABNORMAL HIGH (ref 80.0–100.0)
Monocytes Absolute: 0.3 10*3/uL (ref 0.1–1.0)
Monocytes Relative: 10 %
Neutro Abs: 1.8 10*3/uL (ref 1.7–7.7)
Neutrophils Relative %: 67 %
Platelets: 13 10*3/uL — ABNORMAL LOW (ref 150–400)
RBC: 2.36 MIL/uL — ABNORMAL LOW (ref 3.87–5.11)
RDW: 15.9 % — ABNORMAL HIGH (ref 11.5–15.5)
WBC: 2.7 10*3/uL — ABNORMAL LOW (ref 4.0–10.5)
nRBC: 0 % (ref 0.0–0.2)

## 2022-08-14 LAB — CMP (CANCER CENTER ONLY)
ALT: 30 U/L (ref 0–44)
AST: 32 U/L (ref 15–41)
Albumin: 3.6 g/dL (ref 3.5–5.0)
Alkaline Phosphatase: 154 U/L — ABNORMAL HIGH (ref 38–126)
Anion gap: 8 (ref 5–15)
BUN: 16 mg/dL (ref 8–23)
CO2: 24 mmol/L (ref 22–32)
Calcium: 8.6 mg/dL — ABNORMAL LOW (ref 8.9–10.3)
Chloride: 107 mmol/L (ref 98–111)
Creatinine: 1.27 mg/dL — ABNORMAL HIGH (ref 0.44–1.00)
GFR, Estimated: 47 mL/min — ABNORMAL LOW (ref >60)
Glucose, Bld: 118 mg/dL — ABNORMAL HIGH (ref 70–99)
Potassium: 3.8 mmol/L (ref 3.5–5.1)
Sodium: 139 mmol/L (ref 135–145)
Total Bilirubin: 0.6 mg/dL (ref 0.3–1.2)
Total Protein: 5.7 g/dL — ABNORMAL LOW (ref 6.5–8.1)

## 2022-08-14 LAB — LIPID PANEL
Cholesterol: 112 mg/dL (ref 0–200)
HDL: 51 mg/dL (ref >40)
LDL Cholesterol: 38 mg/dL (ref 0–99)
Total CHOL/HDL Ratio: 2.2 RATIO
Triglycerides: 113 mg/dL (ref <150)
VLDL: 23 mg/dL (ref 0–40)

## 2022-08-14 LAB — BPAM PLATELET PHERESIS
Blood Product Expiration Date: 202406202359
Unit Type and Rh: 5100

## 2022-08-14 LAB — PREPARE PLATELET PHERESIS: Unit division: 0

## 2022-08-14 LAB — SAMPLE TO BLOOD BANK

## 2022-08-14 LAB — HEMOGLOBIN A1C
Hgb A1c MFr Bld: 6.2 % — ABNORMAL HIGH (ref 4.8–5.6)
Mean Plasma Glucose: 131.24 mg/dL

## 2022-08-14 MED ORDER — HEPARIN SOD (PORK) LOCK FLUSH 100 UNIT/ML IV SOLN
500.0000 [IU] | Freq: Once | INTRAVENOUS | Status: AC
  Administered 2022-08-14: 500 [IU] via INTRAVENOUS
  Filled 2022-08-14: qty 5

## 2022-08-14 MED ORDER — SODIUM CHLORIDE 0.9% FLUSH
10.0000 mL | INTRAVENOUS | Status: DC | PRN
  Administered 2022-08-14: 10 mL via INTRAVENOUS
  Filled 2022-08-14: qty 10

## 2022-08-14 NOTE — Progress Notes (Signed)
Hmg 8.3, plt 13,000. Plan for 1 unit platelets tomorrow. Defer follow up to Dr Orlie Dakin who is scheduled to see her tomorrow.

## 2022-08-15 ENCOUNTER — Ambulatory Visit: Payer: Medicare PPO

## 2022-08-15 ENCOUNTER — Inpatient Hospital Stay: Payer: Medicare PPO

## 2022-08-15 ENCOUNTER — Other Ambulatory Visit: Payer: Medicare PPO

## 2022-08-15 ENCOUNTER — Inpatient Hospital Stay (HOSPITAL_BASED_OUTPATIENT_CLINIC_OR_DEPARTMENT_OTHER): Payer: Medicare PPO | Admitting: Oncology

## 2022-08-15 VITALS — BP 128/57 | HR 72 | Temp 98.0°F | Resp 18

## 2022-08-15 DIAGNOSIS — Z5112 Encounter for antineoplastic immunotherapy: Secondary | ICD-10-CM | POA: Diagnosis not present

## 2022-08-15 DIAGNOSIS — C9000 Multiple myeloma not having achieved remission: Secondary | ICD-10-CM

## 2022-08-15 LAB — KAPPA/LAMBDA LIGHT CHAINS
Kappa free light chain: 4.9 mg/L (ref 3.3–19.4)
Kappa, lambda light chain ratio: 0.65 (ref 0.26–1.65)
Lambda free light chains: 7.5 mg/L (ref 5.7–26.3)

## 2022-08-15 MED ORDER — DIPHENHYDRAMINE HCL 25 MG PO CAPS: 25.0000 mg | ORAL_CAPSULE | Freq: Once | ORAL | Status: DC

## 2022-08-15 MED ORDER — DARATUMUMAB-HYALURONIDASE-FIHJ 1800-30000 MG-UT/15ML ~~LOC~~ SOLN
1800.0000 mg | Freq: Once | SUBCUTANEOUS | Status: AC
  Administered 2022-08-15: 1800 mg via SUBCUTANEOUS
  Filled 2022-08-15: qty 15

## 2022-08-15 MED ORDER — SODIUM CHLORIDE 0.9 % IV SOLN
Freq: Once | INTRAVENOUS | Status: AC
  Filled 2022-08-15: qty 250

## 2022-08-15 MED ORDER — HEPARIN SOD (PORK) LOCK FLUSH 100 UNIT/ML IV SOLN
INTRAVENOUS | Status: AC
  Administered 2022-08-15: 500 [IU]
  Filled 2022-08-15: qty 5

## 2022-08-15 MED ORDER — PREDNISONE 20 MG PO TABS: 60.0000 mg | ORAL_TABLET | Freq: Every day | ORAL | 0 refills | Status: DC

## 2022-08-15 MED ORDER — SODIUM CHLORIDE 0.9% FLUSH
10.0000 mL | Freq: Once | INTRAVENOUS | Status: DC | PRN
  Filled 2022-08-15: qty 10

## 2022-08-15 MED ORDER — SODIUM CHLORIDE 0.9% FLUSH
3.0000 mL | Freq: Once | INTRAVENOUS | Status: DC | PRN
  Filled 2022-08-15: qty 3

## 2022-08-15 MED ORDER — ACETAMINOPHEN 325 MG PO TABS: 650.0000 mg | ORAL_TABLET | Freq: Once | ORAL | Status: DC

## 2022-08-15 MED ORDER — ALTEPLASE 2 MG IJ SOLR
2.0000 mg | Freq: Once | INTRAMUSCULAR | Status: DC | PRN
  Filled 2022-08-15: qty 2

## 2022-08-15 MED ORDER — ZOLEDRONIC ACID 4 MG/5ML IV CONC
3.3000 mg | Freq: Once | INTRAVENOUS | Status: AC
  Administered 2022-08-15: 3.3 mg via INTRAVENOUS
  Filled 2022-08-15: qty 4.13

## 2022-08-15 MED ORDER — HEPARIN SOD (PORK) LOCK FLUSH 100 UNIT/ML IV SOLN
250.0000 [IU] | Freq: Once | INTRAVENOUS | Status: DC | PRN
  Filled 2022-08-15: qty 5

## 2022-08-15 NOTE — Progress Notes (Signed)
Patient says that she is still feeling fatigued. She is concerned about her platelets. Also would like to discuss the bone marrow biopsy she had on 08/08/2022

## 2022-08-15 NOTE — Patient Instructions (Signed)
[facility]  Discharge Instructions: Thank you for choosing [facility] to provide your oncology and hematology care.  If you have a lab appointment with the Cancer Center, please go directly to the Cancer Center and check in at the registration area.  Wear comfortable clothing and clothing appropriate for easy access to any Portacath or PICC line.   We strive to give you quality time with your provider. You may need to reschedule your appointment if you arrive late (15 or more minutes).  Arriving late affects you and other patients whose appointments are after yours.  Also, if you miss three or more appointments without notifying the office, you may be dismissed from the clinic at the provider's discretion.      For prescription refill requests, have your pharmacy contact our office and allow 72 hours for refills to be completed.    Today you received the following chemotherapy and/or immunotherapy agents darzalex    To help prevent nausea and vomiting after your treatment, we encourage you to take your nausea medication as directed.  BELOW ARE SYMPTOMS THAT SHOULD BE REPORTED IMMEDIATELY: *FEVER GREATER THAN 100.4 F (38 C) OR HIGHER *CHILLS OR SWEATING *NAUSEA AND VOMITING THAT IS NOT CONTROLLED WITH YOUR NAUSEA MEDICATION *UNUSUAL SHORTNESS OF BREATH *UNUSUAL BRUISING OR BLEEDING *URINARY PROBLEMS (pain or burning when urinating, or frequent urination) *BOWEL PROBLEMS (unusual diarrhea, constipation, pain near the anus) TENDERNESS IN MOUTH AND THROAT WITH OR WITHOUT PRESENCE OF ULCERS (sore throat, sores in mouth, or a toothache) UNUSUAL RASH, SWELLING OR PAIN  UNUSUAL VAGINAL DISCHARGE OR ITCHING   Items with * indicate a potential emergency and should be followed up as soon as possible or go to the Emergency Department if any problems should occur.  Please show the CHEMOTHERAPY ALERT CARD or IMMUNOTHERAPY ALERT CARD at check-in to  the Emergency Department and triage nurse.  Should you have questions after your visit or need to cancel or reschedule your appointment, please contact [facility]  [phone] and follow the prompts.  Office hours are 8:00 a.m. to 4:30 p.m. Monday - Friday. Please note that voicemails left after 4:00 p.m. may not be returned until the following business day.  We are closed weekends and major holidays. You have access to a nurse at all times for urgent questions. Please call the main number to the clinic [phone] and follow the prompts.  For any non-urgent questions, you may also contact your provider using MyChart. We now offer e-Visits for anyone 18 and older to request care online for non-urgent symptoms. For details visit [email or web address].   Also download the MyChart app! Go to the app store, search "MyChart", open the app, select [facility], and log in with your MyChart username and password.    

## 2022-08-15 NOTE — Progress Notes (Signed)
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  INTERVAL HISTORY: Patient returns to clinic today for repeat laboratory work, further evaluation, discussion of her bone marrow biopsy results, and reinitiation of treatment.  She continues to have increased bruising, but otherwise feels well.  She has chronic left knee pain secondary to torn meniscus.  She continues to have back pain, but this is well-controlled on her current dose of tramadol.  The peripheral neuropathy in her feet is essentially unchanged.  She has no other neurologic complaints.  She denies any chest pain, shortness of breath, cough, or hemoptysis.  She denies any nausea, vomiting, constipation, or diarrhea.  She has no urinary complaints.  Patient offers no further specific complaints today.  REVIEW OF SYSTEMS:   Review of Systems  Constitutional: Negative.  Negative for fever, malaise/fatigue and weight loss.  HENT: Negative.  Negative for congestion and sinus pain.   Respiratory: Negative.  Negative for cough, hemoptysis and shortness of breath.   Cardiovascular:  Negative for chest pain and leg swelling.  Gastrointestinal: Negative.  Negative for abdominal pain, diarrhea and nausea.  Genitourinary: Negative.  Negative for dysuria.  Musculoskeletal:  Positive for back pain and joint pain. Negative for neck pain.  Skin: Negative.  Negative for rash.  Neurological:  Positive for sensory change. Negative for dizziness, tremors, focal weakness, weakness and headaches.  Endo/Heme/Allergies:  Bruises/bleeds easily.  Psychiatric/Behavioral: Negative.  The patient is not nervous/anxious.     As per HPI. Otherwise, a  complete review of systems is negative.  PAST MEDICAL HISTORY: Past Medical History:  Diagnosis Date   Anxiety    Diabetes mellitus without complication (HCC) 2016   GERD (gastroesophageal reflux disease)    Hyperlipidemia    Myeloma (HCC)    Myeloma (HCC)    Personal history of colonic polyps    Squamous cell carcinoma of skin 11/25/2013   Left chest. WD SCC with superficial infiltration.   Tachycardia     PAST SURGICAL HISTORY: Past Surgical History:  Procedure Laterality Date   AUGMENTATION MAMMAPLASTY Bilateral 1983   silicone and replacement in 2001   BREAST ENHANCEMENT SURGERY  1983   CESAREAN SECTION  1987, 1989   COLONOSCOPY  1988, 1998,2003, 2008, 2013   COLONOSCOPY WITH PROPOFOL N/A 08/16/2016   Procedure: COLONOSCOPY WITH PROPOFOL;  Surgeon: Earline Mayotte, MD;  Location: [facility] ENDOSCOPY;  Service: Endoscopy;  Laterality: N/A;   COLONOSCOPY WITH PROPOFOL N/A 10/05/2021   Procedure: COLONOSCOPY WITH PROPOFOL;  Surgeon: Earline Mayotte, MD;  Location: [facility] ENDOSCOPY;  Service: Endoscopy;  Laterality: N/A;  HAS A PORT, NEEDS AMPICILLIN PER OFFICE   DILATION AND CURETTAGE OF UTERUS     ENDOMETRIAL ABLATION  12/1991   ganglion cyst removal      IR BONE MARROW BIOPSY & ASPIRATION  08/08/2022   IR IMAGING GUIDED PORT INSERTION  10/27/2020   IR IMAGING GUIDED PORT INSERTION  04/04/2021   KYPHOPLASTY N/A 10/28/2020   Procedure: T12 and L3 KYPHOPLASTY;  Surgeon: Kennedy Bucker, MD;  Location: [facility] ORS;  Service: Orthopedics;  Laterality: N/A;   TONSILLECTOMY     WRIST SURGERY Right 05/1995    FAMILY HISTORY: Family History  Problem Relation Age of Onset  Colon polyps Sister    Colon cancer Father 45   Diabetes Mother    Breast cancer Neg Hx     ADVANCED DIRECTIVES (Y/N):  N  HEALTH MAINTENANCE: Social History   Tobacco Use   Smoking status: Former    Packs/day: 1.00    Years: 4.00    Additional pack years: 0.00    Total pack years: 4.00    Types: Cigarettes     Quit date: 1982    Years since quitting: 42.4   Smokeless tobacco: Never  Vaping Use   Vaping Use: Never used  Substance Use Topics   Alcohol use: Yes    Alcohol/week: 1.0 - 2.0 standard drink of alcohol    Types: 1 - 2 Glasses of wine per week    Comment: occassionally   Drug use: No     Colonoscopy:  PAP:  Bone density:  Lipid panel:  No Known Allergies  Current Outpatient Medications  Medication Sig Dispense Refill   acetaminophen (TYLENOL) 325 MG tablet Take 650 mg by mouth 2 (two) times a week. Premed for velcade. Taking Tuesdays and Friday     acyclovir (ZOVIRAX) 400 MG tablet TAKE 1 TABLET BY MOUTH TWICE DAILY 60 tablet 5   albuterol (VENTOLIN HFA) 108 (90 Base) MCG/ACT inhaler Inhale 2 puffs into the lungs every 4 (four) hours as needed.     ALPRAZolam (XANAX) 0.25 MG tablet TAKE 1 TABLET BY MOUTH AT BEDTIME AS NEEDED FOR SLEEP 30 tablet 2   ascorbic acid (VITAMIN C) 500 MG tablet Take 500 mg by mouth daily.     ASPIRIN 81 PO Take 81 mg by mouth daily.     atorvastatin (LIPITOR) 20 MG tablet Take 20 mg by mouth daily.     busPIRone (BUSPAR) 30 MG tablet Take 30 mg by mouth 2 (two) times daily.     CALCIUM-VITAMIN D PO Take by mouth 2 (two) times daily.     cyclobenzaprine (FLEXERIL) 10 MG tablet TAKE 1 TABLET BY MOUTH AT BEDTIME 30 tablet 2   dexamethasone (DECADRON) 4 MG tablet TAKE 5 TABLETS BY MOUTH ONCE A WEEK 20 tablet 3   diphenhydrAMINE (BENADRYL) 25 mg capsule Take 25 mg by mouth 2 (two) times a week. Pre med for velcade. Only Tuesdays and Fridays     diphenoxylate-atropine (LOMOTIL) 2.5-0.025 MG tablet Take by mouth as needed.     ferrous sulfate 325 (65 FE) MG tablet Take 325 mg by mouth daily with breakfast.     gabapentin (NEURONTIN) 300 MG capsule TAKE 1 CAPSULE BY MOUTH TWICE DAILY 60 capsule 2   ivabradine (CORLANOR) 5 MG TABS tablet Take 5 mg by mouth 2 (two) times daily with a meal.     lenalidomide (REVLIMID) 10 MG capsule TAKE 1 CAPSULE BY MOUTH 1  TIME A DAY FOR 21 DAYS ON THEN 7 DAYS OFF 21 capsule 0   lidocaine-prilocaine (EMLA) cream APPLY 1 APPLICATION TOPICALLY AS NEEDED 30 g 2   liraglutide (VICTOZA) 18 MG/3ML SOPN Inject 1.2 mg into the skin daily.     Magnesium 400 MG TABS Take 1 tablet by mouth daily.     meloxicam (MOBIC) 15 MG tablet Take 15 mg by mouth daily.     methocarbamol (ROBAXIN) 500 MG tablet Take 1 tablet (500 mg total) by mouth every 6 (six) hours as needed for muscle spasms. 40 tablet 1   Multiple Vitamin (MULTI-VITAMIN) tablet Take 1 tablet by mouth daily.     omeprazole (PRILOSEC)  40 MG capsule Take 40 mg by mouth daily.     ondansetron (ZOFRAN) 8 MG tablet Take 1 tablet (8 mg total) by mouth 2 (two) times daily as needed (Nausea or vomiting). 60 tablet 2   oxyCODONE-acetaminophen (PERCOCET/ROXICET) 5-325 MG tablet TAKE 1-2 TABLETS BY MOUTH EVERY 4 HOURS AS NEEDED FOR SEVERE PAIN 90 tablet 0   prochlorperazine (COMPAZINE) 10 MG tablet TAKE 1 TABLET BY MOUTH EVERY 6 HOURS AS NEEDED FOR NAUSEA OR VOMITING 60 tablet 2   traMADol (ULTRAM) 50 MG tablet TAKE 1 TABLET BY MOUTH EVERY 6 HOURS AS NEEDED FOR PAIN 60 tablet 2   No current facility-administered medications for this visit.   Facility-Administered Medications Ordered in Other Visits  Medication Dose Route Frequency Provider Last Rate Last Admin   0.9 %  sodium chloride infusion   Intravenous Continuous Jeralyn Ruths, MD   Stopped at 04/07/21 1142   heparin lock flush 100 unit/mL  500 Units Intravenous Once Jeralyn Ruths, MD       sodium chloride flush (NS) 0.9 % injection 10 mL  10 mL Intravenous PRN Jeralyn Ruths, MD   10 mL at 11/23/20 0932   sodium chloride flush (NS) 0.9 % injection 10 mL  10 mL Intravenous PRN Jeralyn Ruths, MD   10 mL at 04/18/22 1417   sodium chloride flush (NS) 0.9 % injection 10 mL  10 mL Intravenous Once Jeralyn Ruths, MD        OBJECTIVE: Vitals:   08/15/22 0928  BP: (!) 119/55  Pulse: 86  Temp:  98.7 F (37.1 C)  SpO2: 100%     Body mass index is 24.51 kg/m.    ECOG FS:1 - Symptomatic but completely ambulatory  General: Well-developed, well-nourished, no acute distress. Eyes: Pink conjunctiva, anicteric sclera. HEENT: Normocephalic, moist mucous membranes. Lungs: No audible wheezing or coughing. Heart: Regular rate and rhythm. Abdomen: Soft, nontender, no obvious distention. Musculoskeletal: No edema, cyanosis, or clubbing. Neuro: Alert, answering all questions appropriately. Cranial nerves grossly intact. Skin: No rashes or petechiae noted. Psych: Normal affect.  LAB RESULTS:  Lab Results  Component Value Date   NA 139 08/14/2022   K 3.8 08/14/2022   CL 107 08/14/2022   CO2 24 08/14/2022   GLUCOSE 118 (H) 08/14/2022   BUN 16 08/14/2022   CREATININE 1.27 (H) 08/14/2022   CALCIUM 8.6 (L) 08/14/2022   PROT 5.7 (L) 08/14/2022   ALBUMIN 3.6 08/14/2022   AST 32 08/14/2022   ALT 30 08/14/2022   ALKPHOS 154 (H) 08/14/2022   BILITOT 0.6 08/14/2022   GFRNONAA 47 (L) 08/14/2022    Lab Results  Component Value Date   WBC 2.7 (L) 08/14/2022   NEUTROABS 1.8 08/14/2022   HGB 8.3 (L) 08/14/2022   HCT 25.0 (L) 08/14/2022   MCV 105.9 (H) 08/14/2022   PLT 13 (L) 08/14/2022     STUDIES: IR BONE MARROW BIOPSY & ASPIRATION  Result Date: 08/08/2022 INDICATION: 67 year old female referred for bone marrow biopsy EXAM: IR BONE MARRO BIOPY AND ASPIRATION MEDICATIONS: None. ANESTHESIA/SEDATION: Moderate (conscious) sedation was employed during this procedure. A total of Versed 1.5 mg and Fentanyl 75 mcg was administered intravenously. Moderate Sedation Time: 10 minutes. The patient's level of consciousness and vital signs were monitored continuously by radiology nursing throughout the procedure under my direct supervision. FLUOROSCOPY TIME:  Fluoroscopy Time: 3 mGy). COMPLICATIONS: None PROCEDURE: Informed written consent was obtained from the patient after a thorough discussion of  the procedural  risks, benefits and alternatives. All questions were addressed. Maximal Sterile Barrier Technique was utilized including caps, mask, sterile gowns, sterile gloves, sterile drape, hand hygiene and skin antiseptic. A timeout was performed prior to the initiation of the procedure. Patient was positioned prone under the image intensifier. Physical exam was used to determine the L5- S1 level, and then the posterior pelvis was prepped with Chlorhexidine in a sterile fashion. A sterile drape was applied covering the operative field. Sterile gown/sterile gloves were used for the procedure. Local anesthesia was provided with 1% Lidocaine. Posterior left iliac bone was targeted for biopsy. The skin and subcutaneous tissues were infiltrated with 1% lidocaine without epinephrine. A small stab incision was made with an 11 blade scalpel, and an 11 gauge Murphy needle was advanced with fluoro guidance to the posterior cortex. Manual forced was used to advance the needle through the posterior cortex and the stylet was removed. A bone marrow aspirate was retrieved and passed to a cytotechnologist in the room. Murphy needle was used to acquire 2.5 cm of bone marrow core sample. Manual pressure was used for hemostasis and a sterile dressing was placed. No complications were encountered no significant blood loss was encountered. Patient tolerated the procedure well and remained hemodynamically stable throughout. IMPRESSION: Status post image guided left iliac bone marrow biopsy. Signed, Yvone Neu. Miachel Roux, RPVI [facility] Electronically Signed   By: Gilmer Mor D.O.   On: 08/08/2022 10:22   NM PET Image Restage (PS) Whole Body  Result Date: 08/01/2022 CLINICAL DATA:  Subsequent treatment strategy for myeloma. EXAM: NUCLEAR MEDICINE PET WHOLE BODY TECHNIQUE: 7.5 mCi F-18 FDG was injected intravenously. Full-ring PET imaging was performed from the head  to foot after the radiotracer. CT data was obtained and used for attenuation correction and anatomic localization. Fasting blood glucose: 134 mg/dl COMPARISON:  96/29/5284 FINDINGS: Mediastinal blood pool activity: SUV max 2.0 HEAD/NECK: No significant abnormal hypermetabolic activity in this region. Incidental CT findings: none CHEST: Previous left lower lobe lung lesion has resolved. Generalized accentuated Incidental CT findings: Right Port-A-Cath tip: SVC. Bilateral breast implants. ABDOMEN/PELVIS: Physiologic activity in bowel. Incidental CT findings: Mild abdominal aortic atherosclerotic vascular calcification. SKELETON: Widespread low-grade activity throughout the skeleton. Similar regions of accentuated metabolic activity along the cervical spine including left facet activity about the C4-5 level with maximum SUV 8.8, formerly 7.6. This is so seated with degenerative facet findings. Foci of activity associated with old fractures in the left ribs including a left eighth rib fracture with maximum SUV 5.3 compared to contralateral rib which has same maximum SUV of 2.0. There also some old right rib fractures with faintly accentuated activity. Prior vertebral augmentations at T11 and L3. Generalized reduced activity at the L3 level may be therapy related. Abnormal lucency and some cortical loss of the right iliac crest similar to previous, slightly reduced metabolic activity compared to the normal appearing contralateral side. Small foci of degenerative activity associated with degenerative lesions in the feet. Incidental CT findings: Cervical and thoracic spondylosis. EXTREMITIES: No significant abnormal hypermetabolic activity in this region. Incidental CT findings: No significant abnormal hypermetabolic activity in this region. IMPRESSION: 1. Widespread low-grade activity throughout the skeleton, probably from granulocyte/marrow stimulation. 2. Similar regions of accentuated metabolic activity along the  cervical spine including left facet activity about the C4-5 level. 3. Abnormal lucency and cortical loss of the right iliac crest similar to previous, slightly reduced metabolic activity compared to the normal appearing contralateral side.  4. Previous left lower lobe lung lesion has resolved. 5. Old bilateral rib fractures with some accentuated metabolic activity. 6. Prior vertebral augmentations at T11 and L3. Generalized reduced activity at the L3 level may be therapy related. 7. No new or progressive lesions. 8. Aortic atherosclerosis. Aortic Atherosclerosis (ICD10-I70.0). Electronically Signed   By: Gaylyn Rong M.D.   On: 08/01/2022 12:07    ONCOLOGY HISTORY:  Diagnosis confirmed from bone biopsy on August 17, 2020.  Initially her lambda free light chains were elevated at 432.1, but now are within normal limits at 12.1.  Immunoglobulins and SPEP are normal.  Bone marrow biopsy completed on August 26, 2020 revealed 25% plasma cells along with the deletion of 1p which is considered poor prognosis.  PET scan results from September 06, 2020 reviewed independently with 3 hypermetabolic lesions in right C5-6 facets, right iliac crest lesion, and L3 vertebral lesion.  After lengthy discussion with the patient, she agreed to pursue chemotherapy with daratumumab, Velcade, Revlimid, and dexamethasone followed by autologous bone marrow transplant.  Patient received weekly daratumumab for 4 cycles along with Velcade on days 1, 4, 8, and 11.  She also received Revlimid on days 1 through 14 of a 21-day cycle.  She completed cycle 4 of treatment on January 14, 2021.  Subsequent bone marrow biopsy on January 24, 2021 revealed complete remission with no evidence of myeloma.  Patient underwent autologous stem cell transplant at [facility] on February 24, 2021.  She was readmitted to the hospital on March 04, 2021 with neutropenic fever, diarrhea, and sepsis-like symptoms.  Infectious disease work-up was negative.    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  PLAN:      Lambda light chain myeloma with 1p del:  Patient now using consolidation treatment with the GRIFFIN regimen.  She received consolidation with cycle 5 and 6 using daratumumab on day 1, Velcade on day 1, 4, 8, and 11, Revlimid 10 mg on days 1 through 14 with 7 days off, and weekly dexamethasone on a 21-day cycle.  She was on maintenance daratumumab on day 1 and Revlimid 10 mg on days 1 through 21 on a 28-day cycle for cycles 7 through 32.  Repeat bone marrow biopsy on October 03, 2021 revealed no evidence of a myeloid neoplasm.  Her most recent PET scan on July 31, 2022 reviewed independently and report as above with no obvious evidence of recurrent disease.  A second bone marrow biopsy was completed on August 08, 2022 which also revealed no evidence of disease, but cytogenetics are pending at time of dictation.  Given her thrombocytopenia, will discontinue treatment with Revlimid at this time but patient will proceed with maintenance daratumumab every 28 days.  Return to clinic in 4 weeks for further evaluation and continuation of treatment patient takes all of her premedications at home prior to clinic, therefore these have been removed from the treatment plan.   Pain: Well-controlled.  She has completed XRT.  She had kyphoplasty x2.  Continue tramadol.  Patient only uses Percocet sparingly.  Repeat MRI results with essentially no change.  PET scan results as above.  Appreciate orthopedics input who recommended conservative management.   Hypocalcemia: Chronic and unchanged.  Proceed with Zometa. Renal insufficiency: Chronic and unchanged.  Patient's creatinine is 1.27. Anemia: Patient's hemoglobin has declined to 8.3.  Monitor.  Discontinue Revlimid as above.  Thrombocytopenia: Patient's continue to trend down and now a 13.  Proceed with 1 unit  of platelets today.  Bone marrow biopsy as above with normal trilineage  hematopoiesis.  Patient was given a prescription for prednisone 60 mg daily x 7 days to see if there is an improvement in her counts.  Patient is going on a cruise to [place], therefore she will return to clinic on August 25, 2022 for lab check. Neutropenia: Chronic and unchanged.  Monitor. Vaccinations: Continue revaccination schedule as per [facility]. Pulmonary nodule: Patient noted to have a 9 mm hypermetabolic pulmonary nodule in her left lower lobe lung.  Possibly infectious, continue to monitor closely. Left knee pain: Continue conservative management.  Patient expressed understanding and was in agreement with this plan. She also understands that She can call clinic at any time with any questions, concerns, or complaints.      Cancer Staging  Lambda light chain myeloma (HCC) Staging form: Plasma Cell Myeloma and Plasma Cell Disorders, AJCC 8th Edition - Clinical stage from 09/16/2020: RISS Stage I (Beta-2-microglobulin (mg/L): 2.5, Albumin (g/dL): 4.9, ISS: Stage I, High-risk cytogenetics: Absent, LDH: Normal) - Signed by Jeralyn Ruths, MD on 09/16/2020 Beta 2 microglobulin range (mg/L): Less than 3.5 Albumin range (g/dL): Greater than or equal to 3.5 Cytogenetics: 1p deletion  Jeralyn Ruths, MD   08/15/2022 9:36 AM

## 2022-08-16 LAB — BPAM PLATELET PHERESIS
Blood Product Expiration Date: 202406202359
ISSUE DATE / TIME: 202406181103

## 2022-08-16 LAB — PREPARE PLATELET PHERESIS

## 2022-08-17 ENCOUNTER — Encounter (HOSPITAL_COMMUNITY): Payer: Self-pay | Admitting: Oncology

## 2022-08-23 ENCOUNTER — Other Ambulatory Visit: Payer: Self-pay | Admitting: Oncology

## 2022-08-23 DIAGNOSIS — C9000 Multiple myeloma not having achieved remission: Secondary | ICD-10-CM

## 2022-08-24 ENCOUNTER — Encounter: Payer: Self-pay | Admitting: Oncology

## 2022-08-25 ENCOUNTER — Other Ambulatory Visit: Payer: Medicare PPO

## 2022-08-25 ENCOUNTER — Inpatient Hospital Stay: Payer: Medicare PPO

## 2022-08-25 DIAGNOSIS — C9000 Multiple myeloma not having achieved remission: Secondary | ICD-10-CM

## 2022-08-25 DIAGNOSIS — Z5112 Encounter for antineoplastic immunotherapy: Secondary | ICD-10-CM | POA: Diagnosis not present

## 2022-08-25 DIAGNOSIS — Z95828 Presence of other vascular implants and grafts: Secondary | ICD-10-CM

## 2022-08-25 LAB — CBC WITH DIFFERENTIAL/PLATELET
Abs Immature Granulocytes: 0.01 10*3/uL (ref 0.00–0.07)
Basophils Absolute: 0 10*3/uL (ref 0.0–0.1)
Basophils Relative: 0 %
Eosinophils Absolute: 0 10*3/uL (ref 0.0–0.5)
Eosinophils Relative: 1 %
HCT: 25.8 % — ABNORMAL LOW (ref 36.0–46.0)
Hemoglobin: 8.2 g/dL — ABNORMAL LOW (ref 12.0–15.0)
Immature Granulocytes: 1 %
Lymphocytes Relative: 9 %
Lymphs Abs: 0.2 10*3/uL — ABNORMAL LOW (ref 0.7–4.0)
MCH: 35.2 pg — ABNORMAL HIGH (ref 26.0–34.0)
MCHC: 31.8 g/dL (ref 30.0–36.0)
MCV: 110.7 fL — ABNORMAL HIGH (ref 80.0–100.0)
Monocytes Absolute: 0.2 10*3/uL (ref 0.1–1.0)
Monocytes Relative: 11 %
Neutro Abs: 1.4 10*3/uL — ABNORMAL LOW (ref 1.7–7.7)
Neutrophils Relative %: 78 %
Platelets: 19 10*3/uL — ABNORMAL LOW (ref 150–400)
RBC: 2.33 MIL/uL — ABNORMAL LOW (ref 3.87–5.11)
RDW: 17.1 % — ABNORMAL HIGH (ref 11.5–15.5)
WBC: 1.8 10*3/uL — ABNORMAL LOW (ref 4.0–10.5)
nRBC: 0 % (ref 0.0–0.2)

## 2022-08-25 LAB — COMPREHENSIVE METABOLIC PANEL
ALT: 44 U/L (ref 0–44)
AST: 36 U/L (ref 15–41)
Albumin: 3.7 g/dL (ref 3.5–5.0)
Alkaline Phosphatase: 124 U/L (ref 38–126)
Anion gap: 9 (ref 5–15)
BUN: 16 mg/dL (ref 8–23)
CO2: 20 mmol/L — ABNORMAL LOW (ref 22–32)
Calcium: 8.5 mg/dL — ABNORMAL LOW (ref 8.9–10.3)
Chloride: 106 mmol/L (ref 98–111)
Creatinine, Ser: 1.33 mg/dL — ABNORMAL HIGH (ref 0.44–1.00)
GFR, Estimated: 44 mL/min — ABNORMAL LOW (ref >60)
Glucose, Bld: 156 mg/dL — ABNORMAL HIGH (ref 70–99)
Potassium: 3.7 mmol/L (ref 3.5–5.1)
Sodium: 135 mmol/L (ref 135–145)
Total Bilirubin: 0.4 mg/dL (ref 0.3–1.2)
Total Protein: 5.9 g/dL — ABNORMAL LOW (ref 6.5–8.1)

## 2022-08-25 LAB — SAMPLE TO BLOOD BANK

## 2022-08-25 MED ORDER — SODIUM CHLORIDE 0.9% FLUSH
10.0000 mL | Freq: Once | INTRAVENOUS | Status: AC
  Administered 2022-08-25: 10 mL via INTRAVENOUS
  Filled 2022-08-25: qty 10

## 2022-08-25 MED ORDER — HEPARIN SOD (PORK) LOCK FLUSH 100 UNIT/ML IV SOLN
500.0000 [IU] | Freq: Once | INTRAVENOUS | Status: AC
  Administered 2022-08-25: 500 [IU] via INTRAVENOUS
  Filled 2022-08-25: qty 5

## 2022-08-28 LAB — KAPPA/LAMBDA LIGHT CHAINS
Kappa free light chain: 5.9 mg/L (ref 3.3–19.4)
Kappa, lambda light chain ratio: 1.9 — ABNORMAL HIGH (ref 0.26–1.65)
Lambda free light chains: 3.1 mg/L — ABNORMAL LOW (ref 5.7–26.3)

## 2022-09-04 ENCOUNTER — Other Ambulatory Visit: Payer: Self-pay | Admitting: Oncology

## 2022-09-06 ENCOUNTER — Other Ambulatory Visit: Payer: Self-pay

## 2022-09-11 ENCOUNTER — Other Ambulatory Visit: Payer: Self-pay | Admitting: *Deleted

## 2022-09-11 DIAGNOSIS — C9000 Multiple myeloma not having achieved remission: Secondary | ICD-10-CM

## 2022-09-12 ENCOUNTER — Inpatient Hospital Stay: Payer: Medicare PPO | Attending: Oncology | Admitting: Oncology

## 2022-09-12 ENCOUNTER — Other Ambulatory Visit (HOSPITAL_COMMUNITY): Payer: Self-pay

## 2022-09-12 ENCOUNTER — Telehealth: Payer: Self-pay | Admitting: Pharmacist

## 2022-09-12 ENCOUNTER — Inpatient Hospital Stay: Payer: Medicare PPO

## 2022-09-12 ENCOUNTER — Other Ambulatory Visit: Payer: Self-pay

## 2022-09-12 ENCOUNTER — Telehealth: Payer: Self-pay

## 2022-09-12 ENCOUNTER — Encounter: Payer: Self-pay | Admitting: Oncology

## 2022-09-12 ENCOUNTER — Other Ambulatory Visit: Payer: Self-pay | Admitting: Oncology

## 2022-09-12 VITALS — BP 116/55 | HR 80 | Temp 98.6°F | Resp 18

## 2022-09-12 DIAGNOSIS — D61818 Other pancytopenia: Secondary | ICD-10-CM | POA: Insufficient documentation

## 2022-09-12 DIAGNOSIS — Z5112 Encounter for antineoplastic immunotherapy: Secondary | ICD-10-CM | POA: Insufficient documentation

## 2022-09-12 DIAGNOSIS — Z9484 Stem cells transplant status: Secondary | ICD-10-CM | POA: Diagnosis not present

## 2022-09-12 DIAGNOSIS — D693 Immune thrombocytopenic purpura: Secondary | ICD-10-CM | POA: Diagnosis not present

## 2022-09-12 DIAGNOSIS — N289 Disorder of kidney and ureter, unspecified: Secondary | ICD-10-CM | POA: Insufficient documentation

## 2022-09-12 DIAGNOSIS — Z79899 Other long term (current) drug therapy: Secondary | ICD-10-CM | POA: Diagnosis not present

## 2022-09-12 DIAGNOSIS — C9001 Multiple myeloma in remission: Secondary | ICD-10-CM | POA: Insufficient documentation

## 2022-09-12 DIAGNOSIS — C9000 Multiple myeloma not having achieved remission: Secondary | ICD-10-CM

## 2022-09-12 DIAGNOSIS — D696 Thrombocytopenia, unspecified: Secondary | ICD-10-CM

## 2022-09-12 LAB — COMPREHENSIVE METABOLIC PANEL
ALT: 16 U/L (ref 0–44)
AST: 27 U/L (ref 15–41)
Albumin: 3.8 g/dL (ref 3.5–5.0)
Alkaline Phosphatase: 186 U/L — ABNORMAL HIGH (ref 38–126)
Anion gap: 10 (ref 5–15)
BUN: 21 mg/dL (ref 8–23)
CO2: 22 mmol/L (ref 22–32)
Calcium: 8.8 mg/dL — ABNORMAL LOW (ref 8.9–10.3)
Chloride: 104 mmol/L (ref 98–111)
Creatinine, Ser: 1.39 mg/dL — ABNORMAL HIGH (ref 0.44–1.00)
GFR, Estimated: 42 mL/min — ABNORMAL LOW (ref >60)
Glucose, Bld: 262 mg/dL — ABNORMAL HIGH (ref 70–99)
Potassium: 3.2 mmol/L — ABNORMAL LOW (ref 3.5–5.1)
Sodium: 136 mmol/L (ref 135–145)
Total Bilirubin: 0.5 mg/dL (ref 0.3–1.2)
Total Protein: 6.3 g/dL — ABNORMAL LOW (ref 6.5–8.1)

## 2022-09-12 LAB — CBC WITH DIFFERENTIAL/PLATELET
Abs Immature Granulocytes: 0 10*3/uL (ref 0.00–0.07)
Basophils Absolute: 0 10*3/uL (ref 0.0–0.1)
Basophils Relative: 0 %
Eosinophils Absolute: 0.1 10*3/uL (ref 0.0–0.5)
Eosinophils Relative: 4 %
HCT: 28.4 % — ABNORMAL LOW (ref 36.0–46.0)
Hemoglobin: 9.5 g/dL — ABNORMAL LOW (ref 12.0–15.0)
Immature Granulocytes: 0 %
Lymphocytes Relative: 21 %
Lymphs Abs: 0.6 10*3/uL — ABNORMAL LOW (ref 0.7–4.0)
MCH: 34.3 pg — ABNORMAL HIGH (ref 26.0–34.0)
MCHC: 33.5 g/dL (ref 30.0–36.0)
MCV: 102.5 fL — ABNORMAL HIGH (ref 80.0–100.0)
Monocytes Absolute: 0.3 10*3/uL (ref 0.1–1.0)
Monocytes Relative: 12 %
Neutro Abs: 1.8 10*3/uL (ref 1.7–7.7)
Neutrophils Relative %: 63 %
Platelets: 19 10*3/uL — ABNORMAL LOW (ref 150–400)
RBC: 2.77 MIL/uL — ABNORMAL LOW (ref 3.87–5.11)
RDW: 16.8 % — ABNORMAL HIGH (ref 11.5–15.5)
WBC: 2.8 10*3/uL — ABNORMAL LOW (ref 4.0–10.5)
nRBC: 0 % (ref 0.0–0.2)

## 2022-09-12 LAB — SAMPLE TO BLOOD BANK

## 2022-09-12 LAB — PREPARE PLATELET PHERESIS: Unit division: 0

## 2022-09-12 MED ORDER — ELTROMBOPAG OLAMINE 50 MG PO TABS
50.0000 mg | ORAL_TABLET | Freq: Every day | ORAL | 0 refills | Status: DC
Start: 2022-09-12 — End: 2023-01-01
  Filled 2022-09-12 (×2): qty 30, 30d supply, fill #0

## 2022-09-12 MED ORDER — ACETAMINOPHEN 325 MG PO TABS
650.0000 mg | ORAL_TABLET | Freq: Once | ORAL | Status: AC
  Administered 2022-09-12: 650 mg via ORAL
  Filled 2022-09-12: qty 2

## 2022-09-12 MED ORDER — ZOLEDRONIC ACID 4 MG/5ML IV CONC
3.3000 mg | Freq: Once | INTRAVENOUS | Status: AC
  Administered 2022-09-12: 3.3 mg via INTRAVENOUS
  Filled 2022-09-12: qty 4.13

## 2022-09-12 MED ORDER — SODIUM CHLORIDE 0.9% IV SOLUTION
250.0000 mL | Freq: Once | INTRAVENOUS | Status: AC
  Administered 2022-09-12: 250 mL via INTRAVENOUS
  Filled 2022-09-12: qty 250

## 2022-09-12 MED ORDER — HEPARIN SOD (PORK) LOCK FLUSH 100 UNIT/ML IV SOLN
500.0000 [IU] | Freq: Every day | INTRAVENOUS | Status: AC | PRN
  Administered 2022-09-12: 500 [IU]
  Filled 2022-09-12: qty 5

## 2022-09-12 MED ORDER — DIPHENHYDRAMINE HCL 50 MG/ML IJ SOLN
25.0000 mg | Freq: Once | INTRAMUSCULAR | Status: AC
  Administered 2022-09-12: 25 mg via INTRAVENOUS
  Filled 2022-09-12: qty 1

## 2022-09-12 NOTE — Telephone Encounter (Signed)
Oral Oncology Patient Advocate Encounter  New authorization   Received notification that prior authorization for Promacta is required.   PA submitted on 09/12/22  Key [id number]  Status is pending     Ardeen Fillers, CPhT Oncology Pharmacy Patient Advocate  [facility]  [phone] (phone) [phone] (fax) 09/12/2022 11:53 AM

## 2022-09-12 NOTE — Telephone Encounter (Addendum)
Patient successfully OnBoarded. Medication scheduled to be shipped on 09/13/22 for delivery on 09/14/22 from [facility] to patient's address (patient knows they will NOT start medication until 09/26/22). Patient also knows to call me at [phone] with any questions or concerns regarding receiving medication or if there is any unexpected change in co-pay.    Ardeen Fillers, CPhT Oncology Pharmacy Patient Advocate  [facility]  [phone] (phone) [phone] (fax) 09/12/2022 1:30 PM

## 2022-09-12 NOTE — Progress Notes (Signed)
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  ITP.  INTERVAL HISTORY: Patient returns to clinic today for repeat laboratory work and further evaluation.  She recently saw the transplant team at [facility] and have recommended to discontinue all treatments given her persistent pancytopenia.  She continues to have bruising.  She has chronic left knee pain secondary to torn meniscus.  She continues to have back pain, but this is well-controlled on her current dose of tramadol.  The peripheral neuropathy in her feet is essentially unchanged.  She has no other neurologic complaints.  She denies any chest pain, shortness of breath, cough, or hemoptysis.  She denies any nausea, vomiting, constipation, or diarrhea.  She has no urinary complaints.  Patient offers no further specific complaints today.  REVIEW OF SYSTEMS:   Review of Systems  Constitutional: Negative.  Negative for fever, malaise/fatigue and weight loss.  HENT: Negative.  Negative for congestion and sinus pain.   Respiratory: Negative.  Negative for cough, hemoptysis and shortness of breath.   Cardiovascular:  Negative for chest pain and leg swelling.  Gastrointestinal: Negative.  Negative for abdominal pain, diarrhea and nausea.  Genitourinary: Negative.  Negative for dysuria.  Musculoskeletal:  Positive for back pain and joint pain. Negative for neck pain.  Skin: Negative.  Negative for rash.  Neurological:  Positive for sensory change. Negative for dizziness, tremors, focal weakness, weakness and headaches.  Endo/Heme/Allergies:  Bruises/bleeds easily.  Psychiatric/Behavioral: Negative.  The patient is not  nervous/anxious.     As per HPI. Otherwise, a complete review of systems is negative.  PAST MEDICAL HISTORY: Past Medical History:  Diagnosis Date   Anxiety    Diabetes mellitus without complication (HCC) 2016   GERD (gastroesophageal reflux disease)    Hyperlipidemia    Myeloma (HCC)    Myeloma (HCC)    Personal history of colonic polyps    Squamous cell carcinoma of skin 11/25/2013   Left chest. WD SCC with superficial infiltration.   Tachycardia     PAST SURGICAL HISTORY: Past Surgical History:  Procedure Laterality Date   AUGMENTATION MAMMAPLASTY Bilateral 1983   silicone and replacement in 2001   BREAST ENHANCEMENT SURGERY  1983   CESAREAN SECTION  1987, 1989   COLONOSCOPY  1988, 1998,2003, 2008, 2013   COLONOSCOPY WITH PROPOFOL N/A 08/16/2016   Procedure: COLONOSCOPY WITH PROPOFOL;  Surgeon: Earline Mayotte, MD;  Location: [facility];  Service: Endoscopy;  Laterality: N/A;   COLONOSCOPY WITH PROPOFOL N/A 10/05/2021   Procedure: COLONOSCOPY WITH PROPOFOL;  Surgeon: Earline Mayotte, MD;  Location: [facility];  Service: Endoscopy;  Laterality: N/A;  HAS A PORT, NEEDS AMPICILLIN PER OFFICE   DILATION AND CURETTAGE OF UTERUS     ENDOMETRIAL ABLATION  12/1991   ganglion cyst removal      IR BONE MARROW BIOPSY & ASPIRATION  08/08/2022   IR IMAGING GUIDED PORT INSERTION  10/27/2020   IR IMAGING GUIDED PORT INSERTION  04/04/2021   KYPHOPLASTY N/A 10/28/2020   Procedure: T12 and L3 KYPHOPLASTY;  Surgeon: Kennedy Bucker, MD;  Location: [facility] ORS;  Service: Orthopedics;  Laterality: N/A;   TONSILLECTOMY     WRIST SURGERY Right 05/1995    FAMILY HISTORY: Family  History  Problem Relation Age of Onset   Colon polyps Sister    Colon cancer Father 7   Diabetes Mother    Breast cancer Neg Hx     ADVANCED DIRECTIVES (Y/N):  N  HEALTH MAINTENANCE: Social History   Tobacco Use   Smoking status: Former    Current packs/day: 0.00    Average packs/day: 1 pack/day for  4.0 years (4.0 ttl pk-yrs)    Types: Cigarettes    Start date: 80    Quit date: 1982    Years since quitting: 42.5   Smokeless tobacco: Never  Vaping Use   Vaping status: Never Used  Substance Use Topics   Alcohol use: Yes    Alcohol/week: 1.0 - 2.0 standard drink of alcohol    Types: 1 - 2 Glasses of wine per week    Comment: occassionally   Drug use: No     Colonoscopy:  PAP:  Bone density:  Lipid panel:  No Known Allergies  Current Outpatient Medications  Medication Sig Dispense Refill   acetaminophen (TYLENOL) 325 MG tablet Take 650 mg by mouth 2 (two) times a week. Premed for velcade. Taking Tuesdays and Friday     acyclovir (ZOVIRAX) 400 MG tablet TAKE 1 TABLET BY MOUTH TWICE DAILY 60 tablet 5   albuterol (VENTOLIN HFA) 108 (90 Base) MCG/ACT inhaler Inhale 2 puffs into the lungs every 4 (four) hours as needed.     ALPRAZolam (XANAX) 0.25 MG tablet TAKE 1 TABLET BY MOUTH AT BEDTIME AS NEEDED FOR SLEEP 30 tablet 2   ascorbic acid (VITAMIN C) 500 MG tablet Take 500 mg by mouth daily.     atorvastatin (LIPITOR) 20 MG tablet Take 20 mg by mouth daily.     busPIRone (BUSPAR) 30 MG tablet Take 30 mg by mouth 2 (two) times daily.     CALCIUM-VITAMIN D PO Take by mouth 2 (two) times daily.     cyclobenzaprine (FLEXERIL) 10 MG tablet TAKE 1 TABLET BY MOUTH AT BEDTIME 30 tablet 2   dexamethasone (DECADRON) 4 MG tablet TAKE 5 TABLETS BY MOUTH ONCE A WEEK 20 tablet 3   diphenhydrAMINE (BENADRYL) 25 mg capsule Take 25 mg by mouth 2 (two) times a week. Pre med for velcade. Only Tuesdays and Fridays     diphenoxylate-atropine (LOMOTIL) 2.5-0.025 MG tablet Take by mouth as needed.     ferrous sulfate 325 (65 FE) MG tablet Take 325 mg by mouth daily with breakfast.     gabapentin (NEURONTIN) 300 MG capsule TAKE 1 CAPSULE BY MOUTH TWICE DAILY 60 capsule 2   ivabradine (CORLANOR) 5 MG TABS tablet Take 5 mg by mouth 2 (two) times daily with a meal.     lenalidomide (REVLIMID) 10 MG capsule  TAKE 1 CAPSULE BY MOUTH 1 TIME A DAY FOR 21 DAYS ON THEN 7 DAYS OFF 21 capsule 0   lidocaine-prilocaine (EMLA) cream APPLY 1 APPLICATION TOPICALLY AS NEEDED 30 g 2   liraglutide (VICTOZA) 18 MG/3ML SOPN Inject 1.2 mg into the skin daily.     Magnesium 400 MG TABS Take 1 tablet by mouth daily.     meloxicam (MOBIC) 15 MG tablet Take 15 mg by mouth daily.     methocarbamol (ROBAXIN) 500 MG tablet Take 1 tablet (500 mg total) by mouth every 6 (six) hours as needed for muscle spasms. 40 tablet 1   Multiple Vitamin (MULTI-VITAMIN) tablet Take 1 tablet by mouth daily.     omeprazole (PRILOSEC) 40 MG capsule  Take 40 mg by mouth daily.     ondansetron (ZOFRAN) 8 MG tablet Take 1 tablet (8 mg total) by mouth 2 (two) times daily as needed (Nausea or vomiting). 60 tablet 2   oxyCODONE-acetaminophen (PERCOCET/ROXICET) 5-325 MG tablet TAKE 1-2 TABLETS BY MOUTH EVERY 4 HOURS AS NEEDED FOR SEVERE PAIN 90 tablet 0   predniSONE (DELTASONE) 20 MG tablet Take 3 tablets (60 mg total) by mouth daily with breakfast. 21 tablet 0   prochlorperazine (COMPAZINE) 10 MG tablet TAKE 1 TABLET BY MOUTH EVERY 6 HOURS AS NEEDED FOR NAUSEA OR VOMITING 60 tablet 2   traMADol (ULTRAM) 50 MG tablet TAKE 1 TABLET BY MOUTH EVERY 6 HOURS AS NEEDED FOR PAIN 60 tablet 2   No current facility-administered medications for this visit.   Facility-Administered Medications Ordered in Other Visits  Medication Dose Route Frequency Provider Last Rate Last Admin   0.9 %  sodium chloride infusion   Intravenous Continuous Jeralyn Ruths, MD   Stopped at 04/07/21 1142   heparin lock flush 100 unit/mL  500 Units Intravenous Once Jeralyn Ruths, MD       heparin lock flush 100 unit/mL  500 Units Intracatheter Daily PRN Alinda Dooms, NP       sodium chloride flush (NS) 0.9 % injection 10 mL  10 mL Intravenous PRN Jeralyn Ruths, MD   10 mL at 11/23/20 0932   sodium chloride flush (NS) 0.9 % injection 10 mL  10 mL Intravenous PRN  Jeralyn Ruths, MD   10 mL at 04/18/22 1417   sodium chloride flush (NS) 0.9 % injection 10 mL  10 mL Intravenous Once Jeralyn Ruths, MD        OBJECTIVE: Vitals:   09/12/22 0913  BP: 121/65  Pulse: 88  Resp: 16  Temp: 99.8 F (37.7 C)  SpO2: 100%     Body mass index is 24.29 kg/m.    ECOG FS:1 - Symptomatic but completely ambulatory  General: Well-developed, well-nourished, no acute distress. Eyes: Pink conjunctiva, anicteric sclera. HEENT: Normocephalic, moist mucous membranes. Lungs: No audible wheezing or coughing. Heart: Regular rate and rhythm. Abdomen: Soft, nontender, no obvious distention. Musculoskeletal: No edema, cyanosis, or clubbing. Neuro: Alert, answering all questions appropriately. Cranial nerves grossly intact. Skin: No rashes or petechiae noted. Psych: Normal affect.  LAB RESULTS:  Lab Results  Component Value Date   NA 136 09/12/2022   K 3.2 (L) 09/12/2022   CL 104 09/12/2022   CO2 22 09/12/2022   GLUCOSE 262 (H) 09/12/2022   BUN 21 09/12/2022   CREATININE 1.39 (H) 09/12/2022   CALCIUM 8.8 (L) 09/12/2022   PROT 6.3 (L) 09/12/2022   ALBUMIN 3.8 09/12/2022   AST 27 09/12/2022   ALT 16 09/12/2022   ALKPHOS 186 (H) 09/12/2022   BILITOT 0.5 09/12/2022   GFRNONAA 42 (L) 09/12/2022    Lab Results  Component Value Date   WBC 2.8 (L) 09/12/2022   NEUTROABS 1.8 09/12/2022   HGB 9.5 (L) 09/12/2022   HCT 28.4 (L) 09/12/2022   MCV 102.5 (H) 09/12/2022   PLT 19 (L) 09/12/2022     STUDIES: No results found.  ONCOLOGY HISTORY:  Diagnosis confirmed from bone biopsy on August 17, 2020.  Initially her lambda free light chains were elevated at 432.1, but now are within normal limits at 12.1.  Immunoglobulins and SPEP are normal.  Bone marrow biopsy completed on August 26, 2020 revealed 25% plasma cells along with the deletion of  1p which is considered poor prognosis.  PET scan results from September 06, 2020 reviewed independently with 3 hypermetabolic  lesions in right C5-6 facets, right iliac crest lesion, and L3 vertebral lesion.  After lengthy discussion with the patient, she agreed to pursue chemotherapy with daratumumab, Velcade, Revlimid, and dexamethasone followed by autologous bone marrow transplant.  Patient received weekly daratumumab for 4 cycles along with Velcade on days 1, 4, 8, and 11.  She also received Revlimid on days 1 through 14 of a 21-day cycle.  She completed cycle 4 of treatment on January 14, 2021.  Subsequent bone marrow biopsy on January 24, 2021 revealed complete remission with no evidence of myeloma.  Patient underwent autologous stem cell transplant at [facility] on February 24, 2021.  She was readmitted to the hospital on March 04, 2021 with neutropenic fever, diarrhea, and sepsis-like symptoms.  Infectious disease work-up was negative.   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  ITP.  PLAN:      Lambda light chain myeloma with 1p del:  Patient previously received consolidation treatment with the GRIFFIN regimen.  She received consolidation with cycle 5 and 6 using daratumumab on day 1, Velcade on day 1, 4, 8, and 11, Revlimid 10 mg on days 1 through 14 with 7 days off, and weekly dexamethasone on a 21-day cycle.  She was on maintenance daratumumab on day 1 and Revlimid 10 mg on days 1 through 21 on a 28-day cycle for cycles 7 through 32.  Repeat bone marrow biopsy on October 03, 2021 revealed no evidence of a myeloid neoplasm.  Her most recent PET scan on July 31, 2022 reviewed independently and report as above with no obvious evidence of recurrent disease.  A second bone marrow biopsy was completed on August 08, 2022 which also revealed no evidence of disease with normal cytogenetics.  Given her persistent pancytopenia, will discontinue treatment at this time.  If patient's pancytopenia resolves, can consider reinitiating treatment with 5 mg Revlimid.  Patient will continue with Zometa.   Return to clinic in 2 weeks after her cruise for repeat lab work and further evaluation.   ITP: Patient had a normal bone marrow biopsy as above and has not received any myelosuppressive treatment for greater than 4 weeks.  She also failed a 7-day course prednisone.  She is symptomatic with increased bruising.  Proceed with 1 unit of platelets later this week and then patient will initiate Promacta at her next clinic visit.   Hypocalcemia: Chronic and unchanged.  Proceed with Zometa. Renal insufficiency: Chronic and unchanged.  Patient's most recent creatinine is 1.39. Anemia: Hemoglobin improved to 9.5 with transfusion at [facility] last week.  Discontinue treatment as above. Neutropenia: Chronic and unchanged.  Monitor. Vaccinations: Continue revaccination schedule as per [facility]. Pulmonary nodule: Patient noted to have a 9 mm hypermetabolic pulmonary nodule in her left lower lobe lung.  Possibly infectious, continue to monitor closely. Left knee pain: Continue conservative management.  Patient expressed understanding and was in agreement with this plan. She also understands that She can call clinic at any time with any questions, concerns, or complaints.      Cancer Staging  Lambda light chain myeloma (HCC) Staging form: Plasma Cell Myeloma and Plasma Cell Disorders, AJCC 8th Edition - Clinical stage from 09/16/2020: RISS Stage I (Beta-2-microglobulin (mg/L): 2.5, Albumin (g/dL): 4.9, ISS: Stage I, High-risk cytogenetics: Absent, LDH: Normal) - Signed by Jeralyn Ruths, MD on 09/16/2020  Beta 2 microglobulin range (mg/L): Less than 3.5 Albumin range (g/dL): Greater than or equal to 3.5 Cytogenetics: 1p deletion  Jeralyn Ruths, MD   09/12/2022 11:56 AM

## 2022-09-12 NOTE — Telephone Encounter (Signed)
Oral Oncology Patient Advocate Encounter  Prior Authorization for [facility] has been approved.    PA# [id number]  Effective dates: 09/12/22 through 03/11/23  Patients co-pay is $0.00.    Ardeen Fillers, CPhT Oncology Pharmacy Patient Advocate  [facility]  [phone] (phone) [phone] (fax) 09/12/2022 1:02 PM

## 2022-09-12 NOTE — Telephone Encounter (Signed)
Clinical Pharmacist Practitioner Encounter   Received new prescription for Promacta (eltrombopag) for the treatment of ITP, planned duration until disease progression or unacceptable drug toxicity.  CMP from 09/12/22 assessed, no relevant lab abnormalities. Prescription dose and frequency assessed.   Current medication list in Epic reviewed, several DDIs with eltrombopag identified: Eltrombopag will nedd to be separated from patient's calcium, ferrous sulfate, magnesium, and multivitamin. Polyvalent Cation Containing Products may decrease the serum concentration of Eltrombopag. Administer eltrombopag at least 2 hours before or 4 hours after oral administration of any polyvalent cation containing product.  Evaluated chart and no patient barriers to medication adherence identified.   Prescription has been e-scribed to the [facility] for benefits analysis and approval.  Oral Oncology Clinic will continue to follow for insurance authorization, copayment issues, initial counseling and start date.   Remi Haggard, PharmD, BCPS, BCOP, CPP Hematology/Oncology Clinical Pharmacist Practitioner [facility] [phone]  09/12/2022 10:29 AM

## 2022-09-12 NOTE — Patient Instructions (Signed)
[place] [facility]  Discharge Instructions: Thank you for choosing [facility] to provide your oncology and hematology care.  If you have a lab appointment with the Cancer Center, please go directly to the Cancer Center and check in at the registration area.  Wear comfortable clothing and clothing appropriate for easy access to any Portacath or PICC line.   We strive to give you quality time with your provider. You may need to reschedule your appointment if you arrive late (15 or more minutes).  Arriving late affects you and other patients whose appointments are after yours.  Also, if you miss three or more appointments without notifying the office, you may be dismissed from the clinic at the provider's discretion.      For prescription refill requests, have your pharmacy contact our office and allow 72 hours for refills to be completed.    Today you received the following chemotherapy and/or immunotherapy agents zometa, platelet transfusion      To help prevent nausea and vomiting after your treatment, we encourage you to take your nausea medication as directed.  BELOW ARE SYMPTOMS THAT SHOULD BE REPORTED IMMEDIATELY: *FEVER GREATER THAN 100.4 F (38 C) OR HIGHER *CHILLS OR SWEATING *NAUSEA AND VOMITING THAT IS NOT CONTROLLED WITH YOUR NAUSEA MEDICATION *UNUSUAL SHORTNESS OF BREATH *UNUSUAL BRUISING OR BLEEDING *URINARY PROBLEMS (pain or burning when urinating, or frequent urination) *BOWEL PROBLEMS (unusual diarrhea, constipation, pain near the anus) TENDERNESS IN MOUTH AND THROAT WITH OR WITHOUT PRESENCE OF ULCERS (sore throat, sores in mouth, or a toothache) UNUSUAL RASH, SWELLING OR PAIN  UNUSUAL VAGINAL DISCHARGE OR ITCHING   Items with * indicate a potential emergency and should be followed up as soon as possible or go to the Emergency Department if any problems should occur.  Please show the CHEMOTHERAPY ALERT CARD or IMMUNOTHERAPY ALERT  CARD at check-in to the Emergency Department and triage nurse.  Should you have questions after your visit or need to cancel or reschedule your appointment, please contact [place] [facility]  [phone] and follow the prompts.  Office hours are 8:00 a.m. to 4:30 p.m. Monday - Friday. Please note that voicemails left after 4:00 p.m. may not be returned until the following business day.  We are closed weekends and major holidays. You have access to a nurse at all times for urgent questions. Please call the main number to the clinic [phone] and follow the prompts.  For any non-urgent questions, you may also contact your provider using MyChart. We now offer e-Visits for anyone 38 and older to request care online for non-urgent symptoms. For details visit [email or web address].   Also download the MyChart app! Go to the app store, search "MyChart", open the app, select , and log in with your MyChart username and password.

## 2022-09-13 ENCOUNTER — Other Ambulatory Visit (HOSPITAL_COMMUNITY): Payer: Self-pay

## 2022-09-13 LAB — BPAM PLATELET PHERESIS
Blood Product Expiration Date: 202407202359
ISSUE DATE / TIME: 202407161154
Unit Type and Rh: 5100

## 2022-09-13 LAB — PREPARE PLATELET PHERESIS

## 2022-09-13 LAB — KAPPA/LAMBDA LIGHT CHAINS
Kappa free light chain: 6.2 mg/L (ref 3.3–19.4)
Kappa, lambda light chain ratio: 0.65 (ref 0.26–1.65)
Lambda free light chains: 9.6 mg/L (ref 5.7–26.3)

## 2022-09-13 NOTE — Telephone Encounter (Signed)
Received phone call from patient requesting that shipment of Promacta be cancelled. Patient would like to speak with MD, pharmacist, patient's husband (who is also a pharmacist), and patient's daughter-in-law (another pharmacist) at next appointment on 09/26/22 before deciding to start. Patient is concerned with side effects from [facility] while family was researching medication. They also want to talk to MD about Nplate injections over Promacta.     Ardeen Fillers, CPhT Oncology Pharmacy Patient Advocate  [facility]  [phone] (phone) [phone] (fax) 09/13/2022 10:42 AM

## 2022-09-14 ENCOUNTER — Ambulatory Visit: Payer: Medicare PPO

## 2022-09-26 ENCOUNTER — Encounter: Payer: Self-pay | Admitting: Oncology

## 2022-09-26 ENCOUNTER — Inpatient Hospital Stay: Payer: Medicare PPO

## 2022-09-26 ENCOUNTER — Inpatient Hospital Stay: Payer: Medicare PPO | Admitting: Pharmacist

## 2022-09-26 ENCOUNTER — Inpatient Hospital Stay: Payer: Medicare PPO | Admitting: Oncology

## 2022-09-26 VITALS — BP 131/64 | HR 88 | Temp 98.3°F | Resp 16 | Ht 62.0 in | Wt 136.0 lb

## 2022-09-26 DIAGNOSIS — Z95828 Presence of other vascular implants and grafts: Secondary | ICD-10-CM

## 2022-09-26 DIAGNOSIS — C9000 Multiple myeloma not having achieved remission: Secondary | ICD-10-CM

## 2022-09-26 DIAGNOSIS — Z5112 Encounter for antineoplastic immunotherapy: Secondary | ICD-10-CM | POA: Diagnosis not present

## 2022-09-26 LAB — COMPREHENSIVE METABOLIC PANEL
ALT: 28 U/L (ref 0–44)
AST: 29 U/L (ref 15–41)
Albumin: 3.8 g/dL (ref 3.5–5.0)
Alkaline Phosphatase: 188 U/L — ABNORMAL HIGH (ref 38–126)
Anion gap: 8 (ref 5–15)
BUN: 18 mg/dL (ref 8–23)
CO2: 19 mmol/L — ABNORMAL LOW (ref 22–32)
Calcium: 8.3 mg/dL — ABNORMAL LOW (ref 8.9–10.3)
Chloride: 110 mmol/L (ref 98–111)
Creatinine, Ser: 1.61 mg/dL — ABNORMAL HIGH (ref 0.44–1.00)
GFR, Estimated: 35 mL/min — ABNORMAL LOW (ref >60)
Glucose, Bld: 253 mg/dL — ABNORMAL HIGH (ref 70–99)
Potassium: 3.6 mmol/L (ref 3.5–5.1)
Sodium: 137 mmol/L (ref 135–145)
Total Bilirubin: 0.5 mg/dL (ref 0.3–1.2)
Total Protein: 6.3 g/dL — ABNORMAL LOW (ref 6.5–8.1)

## 2022-09-26 LAB — CBC WITH DIFFERENTIAL/PLATELET
Abs Immature Granulocytes: 0.01 10*3/uL (ref 0.00–0.07)
Basophils Absolute: 0 10*3/uL (ref 0.0–0.1)
Basophils Relative: 0 %
Eosinophils Absolute: 0.1 10*3/uL (ref 0.0–0.5)
Eosinophils Relative: 3 %
HCT: 25.1 % — ABNORMAL LOW (ref 36.0–46.0)
Hemoglobin: 8 g/dL — ABNORMAL LOW (ref 12.0–15.0)
Immature Granulocytes: 0 %
Lymphocytes Relative: 19 %
Lymphs Abs: 0.5 10*3/uL — ABNORMAL LOW (ref 0.7–4.0)
MCH: 34.3 pg — ABNORMAL HIGH (ref 26.0–34.0)
MCHC: 31.9 g/dL (ref 30.0–36.0)
MCV: 107.7 fL — ABNORMAL HIGH (ref 80.0–100.0)
Monocytes Absolute: 0.3 10*3/uL (ref 0.1–1.0)
Monocytes Relative: 10 %
Neutro Abs: 1.8 10*3/uL (ref 1.7–7.7)
Neutrophils Relative %: 68 %
Platelets: 32 10*3/uL — ABNORMAL LOW (ref 150–400)
RBC: 2.33 MIL/uL — ABNORMAL LOW (ref 3.87–5.11)
RDW: 18.6 % — ABNORMAL HIGH (ref 11.5–15.5)
WBC: 2.7 10*3/uL — ABNORMAL LOW (ref 4.0–10.5)
nRBC: 0 % (ref 0.0–0.2)

## 2022-09-26 MED ORDER — HEPARIN SOD (PORK) LOCK FLUSH 100 UNIT/ML IV SOLN
500.0000 [IU] | Freq: Once | INTRAVENOUS | Status: AC
  Administered 2022-09-26: 500 [IU] via INTRAVENOUS
  Filled 2022-09-26: qty 5

## 2022-09-26 MED ORDER — SODIUM CHLORIDE 0.9% FLUSH
10.0000 mL | Freq: Once | INTRAVENOUS | Status: AC
  Administered 2022-09-26: 10 mL via INTRAVENOUS
  Filled 2022-09-26: qty 10

## 2022-09-26 NOTE — Progress Notes (Signed)
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  ITP.  INTERVAL HISTORY: Patient returns to clinic today for repeat laboratory work and further discussion on whether or not to initiate Nplate or Promacta.  She now is off all treatment secondary to her persistent pancytopenia.  She currently feels well. She has chronic left knee pain secondary to torn meniscus.  She continues to have back pain, but this is well-controlled on her current dose of tramadol.  The peripheral neuropathy in her feet is essentially unchanged.  She has no other neurologic complaints.  She denies any chest pain, shortness of breath, cough, or hemoptysis.  She denies any nausea, vomiting, constipation, or diarrhea.  She has no urinary complaints.  Patient offers no further specific complaints today.  REVIEW OF SYSTEMS:   Review of Systems  Constitutional: Negative.  Negative for fever, malaise/fatigue and weight loss.  HENT: Negative.  Negative for congestion and sinus pain.   Respiratory: Negative.  Negative for cough, hemoptysis and shortness of breath.   Cardiovascular:  Negative for chest pain and leg swelling.  Gastrointestinal: Negative.  Negative for abdominal pain, diarrhea and nausea.  Genitourinary: Negative.  Negative for dysuria.  Musculoskeletal:  Positive for back pain and joint pain. Negative for neck pain.  Skin: Negative.  Negative for rash.  Neurological:  Positive for sensory change. Negative for dizziness, tremors, focal weakness, weakness and headaches.  Endo/Heme/Allergies: Negative.  Does not bruise/bleed easily.  Psychiatric/Behavioral: Negative.  The patient is not nervous/anxious.     As  per HPI. Otherwise, a complete review of systems is negative.  PAST MEDICAL HISTORY: Past Medical History:  Diagnosis Date   Anxiety    Diabetes mellitus without complication (HCC) 2016   GERD (gastroesophageal reflux disease)    Hyperlipidemia    Myeloma (HCC)    Myeloma (HCC)    Personal history of colonic polyps    Squamous cell carcinoma of skin 11/25/2013   Left chest. WD SCC with superficial infiltration.   Tachycardia     PAST SURGICAL HISTORY: Past Surgical History:  Procedure Laterality Date   AUGMENTATION MAMMAPLASTY Bilateral 1983   silicone and replacement in 2001   BREAST ENHANCEMENT SURGERY  1983   CESAREAN SECTION  1987, 1989   COLONOSCOPY  1988, 1998,2003, 2008, 2013   COLONOSCOPY WITH PROPOFOL N/A 08/16/2016   Procedure: COLONOSCOPY WITH PROPOFOL;  Surgeon: Earline Mayotte, MD;  Location: [facility] ENDOSCOPY;  Service: Endoscopy;  Laterality: N/A;   COLONOSCOPY WITH PROPOFOL N/A 10/05/2021   Procedure: COLONOSCOPY WITH PROPOFOL;  Surgeon: Earline Mayotte, MD;  Location: [facility] ENDOSCOPY;  Service: Endoscopy;  Laterality: N/A;  HAS A PORT, NEEDS AMPICILLIN PER OFFICE   DILATION AND CURETTAGE OF UTERUS     ENDOMETRIAL ABLATION  12/1991   ganglion cyst removal      IR BONE MARROW BIOPSY & ASPIRATION  08/08/2022   IR IMAGING GUIDED PORT INSERTION  10/27/2020   IR IMAGING GUIDED PORT INSERTION  04/04/2021   KYPHOPLASTY N/A 10/28/2020   Procedure: T12 and L3 KYPHOPLASTY;  Surgeon: Kennedy Bucker, MD;  Location: [facility] ORS;  Service: Orthopedics;  Laterality: N/A;   TONSILLECTOMY     WRIST SURGERY Right 05/1995    FAMILY HISTORY:  Family History  Problem Relation Age of Onset   Colon polyps Sister    Colon cancer Father 90   Diabetes Mother    Breast cancer Neg Hx     ADVANCED DIRECTIVES (Y/N):  N  HEALTH MAINTENANCE: Social History   Tobacco Use   Smoking status: Former    Current packs/day: 0.00    Average packs/day: 1 pack/day for 4.0 years (4.0 ttl pk-yrs)     Types: Cigarettes    Start date: 48    Quit date: 1982    Years since quitting: 42.6   Smokeless tobacco: Never  Vaping Use   Vaping status: Never Used  Substance Use Topics   Alcohol use: Yes    Alcohol/week: 1.0 - 2.0 standard drink of alcohol    Types: 1 - 2 Glasses of wine per week    Comment: occassionally   Drug use: No     Colonoscopy:  PAP:  Bone density:  Lipid panel:  No Known Allergies  Current Outpatient Medications  Medication Sig Dispense Refill   acetaminophen (TYLENOL) 325 MG tablet Take 650 mg by mouth 2 (two) times a week. Premed for velcade. Taking Tuesdays and Friday     acyclovir (ZOVIRAX) 400 MG tablet TAKE 1 TABLET BY MOUTH TWICE DAILY 60 tablet 5   albuterol (VENTOLIN HFA) 108 (90 Base) MCG/ACT inhaler Inhale 2 puffs into the lungs every 4 (four) hours as needed.     ALPRAZolam (XANAX) 0.25 MG tablet TAKE 1 TABLET BY MOUTH AT BEDTIME AS NEEDED FOR SLEEP 30 tablet 2   ascorbic acid (VITAMIN C) 500 MG tablet Take 500 mg by mouth daily.     atorvastatin (LIPITOR) 20 MG tablet Take 20 mg by mouth daily.     busPIRone (BUSPAR) 30 MG tablet Take 30 mg by mouth 2 (two) times daily.     CALCIUM-VITAMIN D PO Take by mouth 2 (two) times daily.     cyclobenzaprine (FLEXERIL) 10 MG tablet TAKE 1 TABLET BY MOUTH AT BEDTIME 30 tablet 2   dexamethasone (DECADRON) 4 MG tablet Take 4 mg by mouth daily. 5 tabs po q28 days     diphenhydrAMINE (BENADRYL) 25 mg capsule Take 25 mg by mouth 2 (two) times a week. Pre med for velcade. Only Tuesdays and Fridays     diphenoxylate-atropine (LOMOTIL) 2.5-0.025 MG tablet Take by mouth as needed.     eltrombopag (PROMACTA) 50 MG tablet Take 1 tablet (50 mg total) by mouth daily. Take on an empty stomach 1 hour before a meal or 2 hours after 30 tablet 0   ferrous sulfate 325 (65 FE) MG tablet Take 325 mg by mouth daily with breakfast.     gabapentin (NEURONTIN) 300 MG capsule TAKE 1 CAPSULE BY MOUTH TWICE DAILY 60 capsule 2    ivabradine (CORLANOR) 5 MG TABS tablet Take 5 mg by mouth 2 (two) times daily with a meal.     lidocaine-prilocaine (EMLA) cream APPLY 1 APPLICATION TOPICALLY AS NEEDED 30 g 2   liraglutide (VICTOZA) 18 MG/3ML SOPN Inject 1.2 mg into the skin daily.     Magnesium 400 MG TABS Take 1 tablet by mouth daily.     meloxicam (MOBIC) 15 MG tablet Take 15 mg by mouth daily.     methocarbamol (ROBAXIN) 500 MG tablet Take 1 tablet (500 mg total) by mouth every 6 (six) hours as needed for muscle spasms. 40 tablet 1   Multiple Vitamin (MULTI-VITAMIN) tablet Take 1 tablet by mouth daily.  omeprazole (PRILOSEC) 40 MG capsule Take 40 mg by mouth daily.     ondansetron (ZOFRAN) 8 MG tablet Take 1 tablet (8 mg total) by mouth 2 (two) times daily as needed (Nausea or vomiting). 60 tablet 2   oxyCODONE-acetaminophen (PERCOCET/ROXICET) 5-325 MG tablet TAKE 1-2 TABLETS BY MOUTH EVERY 4 HOURS AS NEEDED FOR SEVERE PAIN 90 tablet 0   predniSONE (DELTASONE) 20 MG tablet Take 3 tablets (60 mg total) by mouth daily with breakfast. 21 tablet 0   prochlorperazine (COMPAZINE) 10 MG tablet TAKE 1 TABLET BY MOUTH EVERY 6 HOURS AS NEEDED FOR NAUSEA OR VOMITING 60 tablet 2   traMADol (ULTRAM) 50 MG tablet TAKE 1 TABLET BY MOUTH EVERY 6 HOURS AS NEEDED FOR PAIN 60 tablet 2   No current facility-administered medications for this visit.   Facility-Administered Medications Ordered in Other Visits  Medication Dose Route Frequency Provider Last Rate Last Admin   0.9 %  sodium chloride infusion   Intravenous Continuous Jeralyn Ruths, MD   Stopped at 04/07/21 1142   heparin lock flush 100 unit/mL  500 Units Intravenous Once Jeralyn Ruths, MD       sodium chloride flush (NS) 0.9 % injection 10 mL  10 mL Intravenous PRN Jeralyn Ruths, MD   10 mL at 11/23/20 0932   sodium chloride flush (NS) 0.9 % injection 10 mL  10 mL Intravenous PRN Jeralyn Ruths, MD   10 mL at 04/18/22 1417   sodium chloride flush (NS) 0.9 %  injection 10 mL  10 mL Intravenous Once Jeralyn Ruths, MD        OBJECTIVE: Vitals:   09/26/22 0935  BP: 131/64  Pulse: 88  Resp: 16  Temp: 98.3 F (36.8 C)  SpO2: 100%     Body mass index is 24.87 kg/m.    ECOG FS:1 - Symptomatic but completely ambulatory  General: Well-developed, well-nourished, no acute distress. Eyes: Pink conjunctiva, anicteric sclera. HEENT: Normocephalic, moist mucous membranes. Lungs: No audible wheezing or coughing. Heart: Regular rate and rhythm. Abdomen: Soft, nontender, no obvious distention. Musculoskeletal: No edema, cyanosis, or clubbing. Neuro: Alert, answering all questions appropriately. Cranial nerves grossly intact. Skin: No rashes or petechiae noted. Psych: Normal affect.  LAB RESULTS:  Lab Results  Component Value Date   NA 137 09/26/2022   K 3.6 09/26/2022   CL 110 09/26/2022   CO2 19 (L) 09/26/2022   GLUCOSE 253 (H) 09/26/2022   BUN 18 09/26/2022   CREATININE 1.61 (H) 09/26/2022   CALCIUM 8.3 (L) 09/26/2022   PROT 6.3 (L) 09/26/2022   ALBUMIN 3.8 09/26/2022   AST 29 09/26/2022   ALT 28 09/26/2022   ALKPHOS 188 (H) 09/26/2022   BILITOT 0.5 09/26/2022   GFRNONAA 35 (L) 09/26/2022    Lab Results  Component Value Date   WBC 2.7 (L) 09/26/2022   NEUTROABS 1.8 09/26/2022   HGB 8.0 (L) 09/26/2022   HCT 25.1 (L) 09/26/2022   MCV 107.7 (H) 09/26/2022   PLT 32 (L) 09/26/2022     STUDIES: No results found.  ONCOLOGY HISTORY:  Diagnosis confirmed from bone biopsy on August 17, 2020.  Initially her lambda free light chains were elevated at 432.1, but now are within normal limits at 12.1.  Immunoglobulins and SPEP are normal.  Bone marrow biopsy completed on August 26, 2020 revealed 25% plasma cells along with the deletion of 1p which is considered poor prognosis.  PET scan results from September 06, 2020 reviewed independently  with 3 hypermetabolic lesions in right C5-6 facets, right iliac crest lesion, and L3 vertebral lesion.   After lengthy discussion with the patient, she agreed to pursue chemotherapy with daratumumab, Velcade, Revlimid, and dexamethasone followed by autologous bone marrow transplant.  Patient received weekly daratumumab for 4 cycles along with Velcade on days 1, 4, 8, and 11.  She also received Revlimid on days 1 through 14 of a 21-day cycle.  She completed cycle 4 of treatment on January 14, 2021.  Subsequent bone marrow biopsy on January 24, 2021 revealed complete remission with no evidence of myeloma.  Patient underwent autologous stem cell transplant at [facility] on February 24, 2021.  She was readmitted to the hospital on March 04, 2021 with neutropenic fever, diarrhea, and sepsis-like symptoms.  Infectious disease work-up was negative.   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  ITP.  PLAN:      Lambda light chain myeloma with 1p del:  Patient previously received consolidation treatment with the GRIFFIN regimen.  She received consolidation with cycle 5 and 6 using daratumumab on day 1, Velcade on day 1, 4, 8, and 11, Revlimid 10 mg on days 1 through 14 with 7 days off, and weekly dexamethasone on a 21-day cycle.  She was on maintenance daratumumab on day 1 and Revlimid 10 mg on days 1 through 21 on a 28-day cycle for cycles 7 through 32.  Her most recent PET scan on July 31, 2022 reviewed independently and report as above with no obvious evidence of recurrent disease.  A second bone marrow biopsy was completed on August 08, 2022 which also revealed no evidence of disease with normal cytogenetics.  Given her persistent pancytopenia, maintenance treatment has been discontinued. If patient's pancytopenia resolves, can consider reinitiating treatment with 5 mg Revlimid.  Return to clinic in 2 weeks for further evaluation and continuation of Zometa.   ITP: Patient had a normal bone marrow biopsy as above.  Platelets mildly improved to 32.  She does not require transfusion  today.  Patient does not need to initiate Nplate or Promacta at this point, but may require treatment in the future.  Repeat laboratory work in [redacted] weeks along with Zometa.   Hypocalcemia: Chronic and unchanged.  Zometa as above.   Renal insufficiency: Creatinine is trended up slightly to 1.61.  Monitor.   Anemia: Hemoglobin trended down to 8.0.  Monitor and consider transfusion in the future.  Discontinue treatment as above. Neutropenia: Chronic and unchanged.  Monitor. Vaccinations: Continue revaccination schedule as per [facility]. Pulmonary nodule: Patient noted to have a 9 mm hypermetabolic pulmonary nodule in her left lower lobe lung.  Possibly infectious, continue to monitor closely. Left knee pain: Continue conservative management.  Patient expressed understanding and was in agreement with this plan. She also understands that She can call clinic at any time with any questions, concerns, or complaints.      Cancer Staging  Lambda light chain myeloma (HCC) Staging form: Plasma Cell Myeloma and Plasma Cell Disorders, AJCC 8th Edition - Clinical stage from 09/16/2020: RISS Stage I (Beta-2-microglobulin (mg/L): 2.5, Albumin (g/dL): 4.9, ISS: Stage I, High-risk cytogenetics: Absent, LDH: Normal) - Signed by Jeralyn Ruths, MD on 09/16/2020 Beta 2 microglobulin range (mg/L): Less than 3.5 Albumin range (g/dL): Greater than or equal to 3.5 Cytogenetics: 1p deletion  Jeralyn Ruths, MD   09/26/2022 12:41 PM

## 2022-09-26 NOTE — Progress Notes (Signed)
Clinical Pharmacist Practitioner Clinic [facility]  [phone] [phone]  Patient Care Team: Jaclyn Shaggy, MD as PCP - General (Internal Medicine) Lemar Livings Merrily Pew, MD as Consulting Physician (General Surgery)   Name of the patient: Hannah Mcdonald  [id number]  [birth year]   Date of visit: 09/26/22  HPI: Patient is a 67 y.o. female with ITP. Patient is trying to decide if she wants to go with romiplostim or eltrombopag.  Reason for Consult: Romiplostim and eltrombopag education.   PAST MEDICAL HISTORY: Past Medical History:  Diagnosis Date   Anxiety    Diabetes mellitus without complication (HCC) 2016   GERD (gastroesophageal reflux disease)    Hyperlipidemia    Myeloma (HCC)    Myeloma (HCC)    Personal history of colonic polyps    Squamous cell carcinoma of skin 11/25/2013   Left chest. WD SCC with superficial infiltration.   Tachycardia     HEMATOLOGY/ONCOLOGY HISTORY:  Oncology History  Lambda light chain myeloma (HCC)  08/24/2020 Initial Diagnosis   Lambda light chain myeloma (HCC)   09/16/2020 Cancer Staging   Staging form: Plasma Cell Myeloma and Plasma Cell Disorders, AJCC 8th Edition - Clinical stage from 09/16/2020: RISS Stage I (Beta-2-microglobulin (mg/L): 2.5, Albumin (g/dL): 4.9, ISS: Stage I, High-risk cytogenetics: Absent, LDH: Normal) - Signed by Jeralyn Ruths, MD on 09/16/2020 Beta 2 microglobulin range (mg/L): Less than 3.5 Albumin range (g/dL): Greater than or equal to 3.5 Cytogenetics: 1p deletion   11/02/2020 - 07/22/2021 Chemotherapy   Patient is on Treatment Plan : POST TRANSPLANT DaraVRd (Daratumumab SQ) q21d x 2 Cycles (Consolidation)- cycle 5&6     08/02/2021 - 09/27/2021 Chemotherapy   Patient is on Treatment Plan : MYELOMA Daratumumab SQ q28d- Maintainance treatment w/ revlimid     08/02/2021 - 08/15/2022 Chemotherapy   Patient is on Treatment Plan : MYELOMA POST-TRANSPLANT MAINTENANE Daratumumab SQ +  Lenalidomide q28d (Maintenance)       ALLERGIES:  has No Known Allergies.  MEDICATIONS:  Current Outpatient Medications  Medication Sig Dispense Refill   acetaminophen (TYLENOL) 325 MG tablet Take 650 mg by mouth 2 (two) times a week. Premed for velcade. Taking Tuesdays and Friday     acyclovir (ZOVIRAX) 400 MG tablet TAKE 1 TABLET BY MOUTH TWICE DAILY 60 tablet 5   albuterol (VENTOLIN HFA) 108 (90 Base) MCG/ACT inhaler Inhale 2 puffs into the lungs every 4 (four) hours as needed.     ALPRAZolam (XANAX) 0.25 MG tablet TAKE 1 TABLET BY MOUTH AT BEDTIME AS NEEDED FOR SLEEP 30 tablet 2   ascorbic acid (VITAMIN C) 500 MG tablet Take 500 mg by mouth daily.     atorvastatin (LIPITOR) 20 MG tablet Take 20 mg by mouth daily.     busPIRone (BUSPAR) 30 MG tablet Take 30 mg by mouth 2 (two) times daily.     CALCIUM-VITAMIN D PO Take by mouth 2 (two) times daily.     cyclobenzaprine (FLEXERIL) 10 MG tablet TAKE 1 TABLET BY MOUTH AT BEDTIME 30 tablet 2   dexamethasone (DECADRON) 4 MG tablet Take 4 mg by mouth daily. 5 tabs po q28 days     diphenhydrAMINE (BENADRYL) 25 mg capsule Take 25 mg by mouth 2 (two) times a week. Pre med for velcade. Only Tuesdays and Fridays     diphenoxylate-atropine (LOMOTIL) 2.5-0.025 MG tablet Take by mouth as needed.     eltrombopag (PROMACTA) 50 MG tablet Take 1 tablet (50 mg total) by mouth daily. Take  on an empty stomach 1 hour before a meal or 2 hours after 30 tablet 0   ferrous sulfate 325 (65 FE) MG tablet Take 325 mg by mouth daily with breakfast.     gabapentin (NEURONTIN) 300 MG capsule TAKE 1 CAPSULE BY MOUTH TWICE DAILY 60 capsule 2   ivabradine (CORLANOR) 5 MG TABS tablet Take 5 mg by mouth 2 (two) times daily with a meal.     lidocaine-prilocaine (EMLA) cream APPLY 1 APPLICATION TOPICALLY AS NEEDED 30 g 2   liraglutide (VICTOZA) 18 MG/3ML SOPN Inject 1.2 mg into the skin daily.     Magnesium 400 MG TABS Take 1 tablet by mouth daily.     meloxicam (MOBIC) 15  MG tablet Take 15 mg by mouth daily.     methocarbamol (ROBAXIN) 500 MG tablet Take 1 tablet (500 mg total) by mouth every 6 (six) hours as needed for muscle spasms. 40 tablet 1   Multiple Vitamin (MULTI-VITAMIN) tablet Take 1 tablet by mouth daily.     omeprazole (PRILOSEC) 40 MG capsule Take 40 mg by mouth daily.     ondansetron (ZOFRAN) 8 MG tablet Take 1 tablet (8 mg total) by mouth 2 (two) times daily as needed (Nausea or vomiting). 60 tablet 2   oxyCODONE-acetaminophen (PERCOCET/ROXICET) 5-325 MG tablet TAKE 1-2 TABLETS BY MOUTH EVERY 4 HOURS AS NEEDED FOR SEVERE PAIN 90 tablet 0   predniSONE (DELTASONE) 20 MG tablet Take 3 tablets (60 mg total) by mouth daily with breakfast. 21 tablet 0   prochlorperazine (COMPAZINE) 10 MG tablet TAKE 1 TABLET BY MOUTH EVERY 6 HOURS AS NEEDED FOR NAUSEA OR VOMITING 60 tablet 2   traMADol (ULTRAM) 50 MG tablet TAKE 1 TABLET BY MOUTH EVERY 6 HOURS AS NEEDED FOR PAIN 60 tablet 2   No current facility-administered medications for this visit.   Facility-Administered Medications Ordered in Other Visits  Medication Dose Route Frequency Provider Last Rate Last Admin   0.9 %  sodium chloride infusion   Intravenous Continuous Jeralyn Ruths, MD   Stopped at 04/07/21 1142   heparin lock flush 100 unit/mL  500 Units Intravenous Once Jeralyn Ruths, MD       sodium chloride flush (NS) 0.9 % injection 10 mL  10 mL Intravenous PRN Jeralyn Ruths, MD   10 mL at 11/23/20 0932   sodium chloride flush (NS) 0.9 % injection 10 mL  10 mL Intravenous PRN Jeralyn Ruths, MD   10 mL at 04/18/22 1417   sodium chloride flush (NS) 0.9 % injection 10 mL  10 mL Intravenous Once Jeralyn Ruths, MD        VITAL SIGNS: There were no vitals taken for this visit. There were no vitals filed for this visit.  Estimated body mass index is 24.87 kg/m as calculated from the following:   Height as of an earlier encounter on 09/26/22: 5\' 2"  (1.575 m).   Weight as of  an earlier encounter on 09/26/22: 61.7 kg (136 lb).  LABS: CBC:    Component Value Date/Time   WBC 2.7 (L) 09/26/2022 0920   HGB 8.0 (L) 09/26/2022 0920   HCT 25.1 (L) 09/26/2022 0920   PLT 32 (L) 09/26/2022 0920   MCV 107.7 (H) 09/26/2022 0920   NEUTROABS 1.8 09/26/2022 0920   LYMPHSABS 0.5 (L) 09/26/2022 0920   MONOABS 0.3 09/26/2022 0920   EOSABS 0.1 09/26/2022 0920   BASOSABS 0.0 09/26/2022 0920   Comprehensive Metabolic Panel:  Component Value Date/Time   NA 137 09/26/2022 0920   K 3.6 09/26/2022 0920   CL 110 09/26/2022 0920   CO2 19 (L) 09/26/2022 0920   BUN 18 09/26/2022 0920   CREATININE 1.61 (H) 09/26/2022 0920   CREATININE 1.27 (H) 08/14/2022 1018   GLUCOSE 253 (H) 09/26/2022 0920   CALCIUM 8.3 (L) 09/26/2022 0920   AST 29 09/26/2022 0920   AST 32 08/14/2022 1018   ALT 28 09/26/2022 0920   ALT 30 08/14/2022 1018   ALKPHOS 188 (H) 09/26/2022 0920   BILITOT 0.5 09/26/2022 0920   BILITOT 0.6 08/14/2022 1018   PROT 6.3 (L) 09/26/2022 0920   ALBUMIN 3.8 09/26/2022 0920     Present during today's visit: Patient, husband, and daughter-in-law on phone  Start plan: Patient will hold on starting any medication for her ITP, today's pltc increased will continue to monitor pltc.    Patient Education I spoke with patient for overview of romiplostim and eltrombopag to aid in patient's ability to decide what treatment she would like to go with when the time comes.   Administration: Counseled patient on administration, dosing, side effects, and monitoring of both romiplostim and eltrombopag  Patient was provided with a medication handout for eltrombopag and Micromedex adverse event sheets for romiplostim and eltrombopag  Questions from patient and her family were answered during visit.  Patient expressed understanding and was in agreement with this plan. She also understands that She can call clinic at any time with any questions, concerns, or complaints.    Follow-up plan: RTC in 2 weeks as scheduled   Thank you for allowing me to participate in the care of this patient.   Time Total: 20 mins   Visit consisted of counseling and education on dealing with issues of symptom management in the setting of serious and potentially life-threatening illness.Greater than 50%  of this time was spent counseling and coordinating care related to the above assessment and plan.  Signed by: Remi Haggard, PharmD, BCPS, Nolon Bussing, CPP Hematology/Oncology Clinical Pharmacist Practitioner [facility] [phone]  09/26/2022 12:36 PM

## 2022-09-29 ENCOUNTER — Other Ambulatory Visit: Payer: Self-pay | Admitting: Internal Medicine

## 2022-09-29 DIAGNOSIS — Z1231 Encounter for screening mammogram for malignant neoplasm of breast: Secondary | ICD-10-CM

## 2022-09-30 ENCOUNTER — Encounter: Payer: Self-pay | Admitting: Oncology

## 2022-10-02 ENCOUNTER — Other Ambulatory Visit: Payer: Self-pay | Admitting: Nurse Practitioner

## 2022-10-02 DIAGNOSIS — C9000 Multiple myeloma not having achieved remission: Secondary | ICD-10-CM

## 2022-10-02 MED ORDER — DIPHENOXYLATE-ATROPINE 2.5-0.025 MG PO TABS
1.0000 | ORAL_TABLET | Freq: Three times a day (TID) | ORAL | 0 refills | Status: AC | PRN
Start: 2022-10-02 — End: ?

## 2022-10-09 ENCOUNTER — Other Ambulatory Visit: Payer: Self-pay

## 2022-10-09 DIAGNOSIS — D649 Anemia, unspecified: Secondary | ICD-10-CM

## 2022-10-09 DIAGNOSIS — C9000 Multiple myeloma not having achieved remission: Secondary | ICD-10-CM

## 2022-10-10 ENCOUNTER — Inpatient Hospital Stay: Payer: Medicare PPO

## 2022-10-10 ENCOUNTER — Inpatient Hospital Stay: Payer: Medicare PPO | Attending: Oncology

## 2022-10-10 ENCOUNTER — Encounter: Payer: Self-pay | Admitting: Oncology

## 2022-10-10 ENCOUNTER — Other Ambulatory Visit: Payer: Medicare PPO

## 2022-10-10 ENCOUNTER — Inpatient Hospital Stay (HOSPITAL_BASED_OUTPATIENT_CLINIC_OR_DEPARTMENT_OTHER): Payer: Medicare PPO | Admitting: Oncology

## 2022-10-10 ENCOUNTER — Ambulatory Visit: Payer: Medicare PPO

## 2022-10-10 ENCOUNTER — Ambulatory Visit: Payer: Medicare PPO | Admitting: Oncology

## 2022-10-10 VITALS — HR 91 | Temp 97.9°F | Resp 18 | Ht 62.0 in | Wt 136.8 lb

## 2022-10-10 DIAGNOSIS — C9001 Multiple myeloma in remission: Secondary | ICD-10-CM | POA: Diagnosis present

## 2022-10-10 DIAGNOSIS — Z79899 Other long term (current) drug therapy: Secondary | ICD-10-CM | POA: Insufficient documentation

## 2022-10-10 DIAGNOSIS — Z7984 Long term (current) use of oral hypoglycemic drugs: Secondary | ICD-10-CM | POA: Diagnosis not present

## 2022-10-10 DIAGNOSIS — C9000 Multiple myeloma not having achieved remission: Secondary | ICD-10-CM

## 2022-10-10 DIAGNOSIS — N289 Disorder of kidney and ureter, unspecified: Secondary | ICD-10-CM | POA: Insufficient documentation

## 2022-10-10 DIAGNOSIS — R911 Solitary pulmonary nodule: Secondary | ICD-10-CM | POA: Diagnosis not present

## 2022-10-10 DIAGNOSIS — D693 Immune thrombocytopenic purpura: Secondary | ICD-10-CM | POA: Diagnosis not present

## 2022-10-10 DIAGNOSIS — D61818 Other pancytopenia: Secondary | ICD-10-CM | POA: Diagnosis not present

## 2022-10-10 DIAGNOSIS — Z95828 Presence of other vascular implants and grafts: Secondary | ICD-10-CM

## 2022-10-10 DIAGNOSIS — D649 Anemia, unspecified: Secondary | ICD-10-CM | POA: Diagnosis not present

## 2022-10-10 DIAGNOSIS — M25562 Pain in left knee: Secondary | ICD-10-CM | POA: Diagnosis not present

## 2022-10-10 LAB — CMP (CANCER CENTER ONLY)
ALT: 23 U/L (ref 0–44)
AST: 30 U/L (ref 15–41)
Albumin: 4 g/dL (ref 3.5–5.0)
Alkaline Phosphatase: 176 U/L — ABNORMAL HIGH (ref 38–126)
Anion gap: 8 (ref 5–15)
BUN: 19 mg/dL (ref 8–23)
CO2: 19 mmol/L — ABNORMAL LOW (ref 22–32)
Calcium: 9.1 mg/dL (ref 8.9–10.3)
Chloride: 109 mmol/L (ref 98–111)
Creatinine: 1.38 mg/dL — ABNORMAL HIGH (ref 0.44–1.00)
GFR, Estimated: 42 mL/min — ABNORMAL LOW (ref >60)
Glucose, Bld: 246 mg/dL — ABNORMAL HIGH (ref 70–99)
Potassium: 3.6 mmol/L (ref 3.5–5.1)
Sodium: 136 mmol/L (ref 135–145)
Total Bilirubin: 0.4 mg/dL (ref 0.3–1.2)
Total Protein: 6.5 g/dL (ref 6.5–8.1)

## 2022-10-10 LAB — CBC WITH DIFFERENTIAL (CANCER CENTER ONLY)
Abs Immature Granulocytes: 0.01 10*3/uL (ref 0.00–0.07)
Basophils Absolute: 0 10*3/uL (ref 0.0–0.1)
Basophils Relative: 0 %
Eosinophils Absolute: 0.1 10*3/uL (ref 0.0–0.5)
Eosinophils Relative: 2 %
HCT: 27 % — ABNORMAL LOW (ref 36.0–46.0)
Hemoglobin: 8.5 g/dL — ABNORMAL LOW (ref 12.0–15.0)
Immature Granulocytes: 0 %
Lymphocytes Relative: 16 %
Lymphs Abs: 0.6 10*3/uL — ABNORMAL LOW (ref 0.7–4.0)
MCH: 35.4 pg — ABNORMAL HIGH (ref 26.0–34.0)
MCHC: 31.5 g/dL (ref 30.0–36.0)
MCV: 112.5 fL — ABNORMAL HIGH (ref 80.0–100.0)
Monocytes Absolute: 0.4 10*3/uL (ref 0.1–1.0)
Monocytes Relative: 9 %
Neutro Abs: 2.8 10*3/uL (ref 1.7–7.7)
Neutrophils Relative %: 73 %
Platelet Count: 58 10*3/uL — ABNORMAL LOW (ref 150–400)
RBC: 2.4 MIL/uL — ABNORMAL LOW (ref 3.87–5.11)
RDW: 17.8 % — ABNORMAL HIGH (ref 11.5–15.5)
WBC Count: 3.9 10*3/uL — ABNORMAL LOW (ref 4.0–10.5)
nRBC: 0 % (ref 0.0–0.2)

## 2022-10-10 MED ORDER — ZOLEDRONIC ACID 4 MG/5ML IV CONC
3.3000 mg | Freq: Once | INTRAVENOUS | Status: AC
  Administered 2022-10-10: 3.3 mg via INTRAVENOUS
  Filled 2022-10-10: qty 4.13

## 2022-10-10 MED ORDER — HEPARIN SOD (PORK) LOCK FLUSH 100 UNIT/ML IV SOLN
500.0000 [IU] | Freq: Once | INTRAVENOUS | Status: AC
  Administered 2022-10-10: 500 [IU] via INTRAVENOUS
  Filled 2022-10-10: qty 5

## 2022-10-10 MED ORDER — SODIUM CHLORIDE 0.9 % IV SOLN
INTRAVENOUS | Status: DC
  Filled 2022-10-10: qty 250

## 2022-10-10 NOTE — Progress Notes (Signed)
Patient would like to discuss recent labs. She would also like to come up with a plan regarding her having trouble sleeping.

## 2022-10-10 NOTE — Progress Notes (Signed)
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  ITP.  INTERVAL HISTORY: Patient returns to clinic today for repeat laboratory work and continuation of Zometa.  She is having increased insomnia, but otherwise feels well.  She has chronic left knee and back pain that is unchanged.  The peripheral neuropathy in her feet is also unchanged.  She has no other neurologic complaints.  She denies any chest pain, shortness of breath, cough, or hemoptysis.  She denies any nausea, vomiting, constipation, or diarrhea.  She has no urinary complaints.  Patient offers no further specific complaints today.  REVIEW OF SYSTEMS:   Review of Systems  Constitutional: Negative.  Negative for fever, malaise/fatigue and weight loss.  HENT: Negative.  Negative for congestion and sinus pain.   Respiratory: Negative.  Negative for cough, hemoptysis and shortness of breath.   Cardiovascular:  Negative for chest pain and leg swelling.  Gastrointestinal: Negative.  Negative for abdominal pain, diarrhea and nausea.  Genitourinary: Negative.  Negative for dysuria.  Musculoskeletal:  Positive for back pain and joint pain. Negative for neck pain.  Skin: Negative.  Negative for rash.  Neurological:  Positive for sensory change. Negative for dizziness, tremors, focal weakness, weakness and headaches.  Endo/Heme/Allergies: Negative.  Does not bruise/bleed easily.  Psychiatric/Behavioral:  The patient has insomnia. The patient is not nervous/anxious.     As per HPI. Otherwise, a complete review of systems is negative.  PAST MEDICAL HISTORY: Past Medical History:  Diagnosis Date   Anxiety    Diabetes mellitus without  complication (HCC) 2016   GERD (gastroesophageal reflux disease)    Hyperlipidemia    Myeloma (HCC)    Myeloma (HCC)    Personal history of colonic polyps    Squamous cell carcinoma of skin 11/25/2013   Left chest. WD SCC with superficial infiltration.   Tachycardia     PAST SURGICAL HISTORY: Past Surgical History:  Procedure Laterality Date   AUGMENTATION MAMMAPLASTY Bilateral 1983   silicone and replacement in 2001   BREAST ENHANCEMENT SURGERY  1983   CESAREAN SECTION  1987, 1989   COLONOSCOPY  1988, 1998,2003, 2008, 2013   COLONOSCOPY WITH PROPOFOL N/A 08/16/2016   Procedure: COLONOSCOPY WITH PROPOFOL;  Surgeon: Earline Mayotte, MD;  Location: [facility] ENDOSCOPY;  Service: Endoscopy;  Laterality: N/A;   COLONOSCOPY WITH PROPOFOL N/A 10/05/2021   Procedure: COLONOSCOPY WITH PROPOFOL;  Surgeon: Earline Mayotte, MD;  Location: [facility] ENDOSCOPY;  Service: Endoscopy;  Laterality: N/A;  HAS A PORT, NEEDS AMPICILLIN PER OFFICE   DILATION AND CURETTAGE OF UTERUS     ENDOMETRIAL ABLATION  12/1991   ganglion cyst removal      IR BONE MARROW BIOPSY & ASPIRATION  08/08/2022   IR IMAGING GUIDED PORT INSERTION  10/27/2020   IR IMAGING GUIDED PORT INSERTION  04/04/2021   KYPHOPLASTY N/A 10/28/2020   Procedure: T12 and L3 KYPHOPLASTY;  Surgeon: Kennedy Bucker, MD;  Location: [facility] ORS;  Service: Orthopedics;  Laterality: N/A;   TONSILLECTOMY     WRIST SURGERY Right 05/1995    FAMILY HISTORY: Family History  Problem Relation Age of Onset   Colon polyps Sister    Colon cancer Father 22   Diabetes Mother  Breast cancer Neg Hx     ADVANCED DIRECTIVES (Y/N):  N  HEALTH MAINTENANCE: Social History   Tobacco Use   Smoking status: Former    Current packs/day: 0.00    Average packs/day: 1 pack/day for 4.0 years (4.0 ttl pk-yrs)    Types: Cigarettes    Start date: 27    Quit date: 1982    Years since quitting: 42.6   Smokeless tobacco: Never  Vaping Use   Vaping status: Never Used   Substance Use Topics   Alcohol use: Yes    Alcohol/week: 1.0 - 2.0 standard drink of alcohol    Types: 1 - 2 Glasses of wine per week    Comment: occassionally   Drug use: No     Colonoscopy:  PAP:  Bone density:  Lipid panel:  No Known Allergies  Current Outpatient Medications  Medication Sig Dispense Refill   acetaminophen (TYLENOL) 325 MG tablet Take 650 mg by mouth 2 (two) times a week. Premed for velcade. Taking Tuesdays and Friday     acyclovir (ZOVIRAX) 400 MG tablet TAKE 1 TABLET BY MOUTH TWICE DAILY 60 tablet 5   albuterol (VENTOLIN HFA) 108 (90 Base) MCG/ACT inhaler Inhale 2 puffs into the lungs every 4 (four) hours as needed.     ALPRAZolam (XANAX) 0.25 MG tablet TAKE 1 TABLET BY MOUTH AT BEDTIME AS NEEDED FOR SLEEP 30 tablet 2   ascorbic acid (VITAMIN C) 500 MG tablet Take 500 mg by mouth daily.     atorvastatin (LIPITOR) 20 MG tablet Take 20 mg by mouth daily.     busPIRone (BUSPAR) 30 MG tablet Take 30 mg by mouth 2 (two) times daily.     CALCIUM-VITAMIN D PO Take by mouth 2 (two) times daily.     cyclobenzaprine (FLEXERIL) 10 MG tablet TAKE 1 TABLET BY MOUTH AT BEDTIME 30 tablet 2   diphenhydrAMINE (BENADRYL) 25 mg capsule Take 25 mg by mouth 2 (two) times a week. Pre med for velcade. Only Tuesdays and Fridays     diphenoxylate-atropine (LOMOTIL) 2.5-0.025 MG tablet Take 1-2 tablets by mouth every 8 (eight) hours as needed for diarrhea or loose stools. 90 tablet 0   eltrombopag (PROMACTA) 50 MG tablet Take 1 tablet (50 mg total) by mouth daily. Take on an empty stomach 1 hour before a meal or 2 hours after 30 tablet 0   ferrous sulfate 325 (65 FE) MG tablet Take 325 mg by mouth daily with breakfast.     gabapentin (NEURONTIN) 300 MG capsule TAKE 1 CAPSULE BY MOUTH TWICE DAILY 60 capsule 2   ivabradine (CORLANOR) 5 MG TABS tablet Take 5 mg by mouth 2 (two) times daily with a meal.     lidocaine-prilocaine (EMLA) cream APPLY 1 APPLICATION TOPICALLY AS NEEDED 30 g 2    liraglutide (VICTOZA) 18 MG/3ML SOPN Inject 1.2 mg into the skin daily.     Magnesium 400 MG TABS Take 1 tablet by mouth daily.     meloxicam (MOBIC) 15 MG tablet Take 15 mg by mouth daily.     Multiple Vitamin (MULTI-VITAMIN) tablet Take 1 tablet by mouth daily.     omeprazole (PRILOSEC) 40 MG capsule Take 40 mg by mouth daily.     ondansetron (ZOFRAN) 8 MG tablet Take 1 tablet (8 mg total) by mouth 2 (two) times daily as needed (Nausea or vomiting). 60 tablet 2   oxyCODONE-acetaminophen (PERCOCET/ROXICET) 5-325 MG tablet TAKE 1-2 TABLETS BY MOUTH EVERY 4 HOURS AS NEEDED FOR  SEVERE PAIN 90 tablet 0   prochlorperazine (COMPAZINE) 10 MG tablet TAKE 1 TABLET BY MOUTH EVERY 6 HOURS AS NEEDED FOR NAUSEA OR VOMITING 60 tablet 2   traMADol (ULTRAM) 50 MG tablet TAKE 1 TABLET BY MOUTH EVERY 6 HOURS AS NEEDED FOR PAIN 60 tablet 2   dexamethasone (DECADRON) 4 MG tablet Take 4 mg by mouth daily. 5 tabs po q28 days (Patient not taking: Reported on 10/10/2022)     methocarbamol (ROBAXIN) 500 MG tablet Take 1 tablet (500 mg total) by mouth every 6 (six) hours as needed for muscle spasms. (Patient not taking: Reported on 10/10/2022) 40 tablet 1   predniSONE (DELTASONE) 20 MG tablet Take 3 tablets (60 mg total) by mouth daily with breakfast. (Patient not taking: Reported on 10/10/2022) 21 tablet 0   No current facility-administered medications for this visit.   Facility-Administered Medications Ordered in Other Visits  Medication Dose Route Frequency Provider Last Rate Last Admin   0.9 %  sodium chloride infusion   Intravenous Continuous Jeralyn Ruths, MD   Stopped at 04/07/21 1142   0.9 %  sodium chloride infusion   Intravenous Continuous Jeralyn Ruths, MD 10 mL/hr at 10/10/22 1524 New Bag at 10/10/22 1524   heparin lock flush 100 unit/mL  500 Units Intravenous Once Jeralyn Ruths, MD       sodium chloride flush (NS) 0.9 % injection 10 mL  10 mL Intravenous PRN Jeralyn Ruths, MD   10 mL at  11/23/20 0932   sodium chloride flush (NS) 0.9 % injection 10 mL  10 mL Intravenous PRN Jeralyn Ruths, MD   10 mL at 04/18/22 1417   sodium chloride flush (NS) 0.9 % injection 10 mL  10 mL Intravenous Once Jeralyn Ruths, MD       zoledronic acid (ZOMETA) 3.3 mg in sodium chloride 0.9 % 100 mL IVPB  3.3 mg Intravenous Once Jeralyn Ruths, MD        OBJECTIVE: Vitals:   10/10/22 1427  Pulse: 91  Resp: 18  Temp: 97.9 F (36.6 C)  SpO2: 97%     Body mass index is 25.02 kg/m.    ECOG FS:0 - Asymptomatic  General: Well-developed, well-nourished, no acute distress. Eyes: Pink conjunctiva, anicteric sclera. HEENT: Normocephalic, moist mucous membranes. Lungs: No audible wheezing or coughing. Heart: Regular rate and rhythm. Abdomen: Soft, nontender, no obvious distention. Musculoskeletal: No edema, cyanosis, or clubbing. Neuro: Alert, answering all questions appropriately. Cranial nerves grossly intact. Skin: No rashes or petechiae noted. Psych: Normal affect.  LAB RESULTS:  Lab Results  Component Value Date   NA 136 10/10/2022   K 3.6 10/10/2022   CL 109 10/10/2022   CO2 19 (L) 10/10/2022   GLUCOSE 246 (H) 10/10/2022   BUN 19 10/10/2022   CREATININE 1.38 (H) 10/10/2022   CALCIUM 9.1 10/10/2022   PROT 6.5 10/10/2022   ALBUMIN 4.0 10/10/2022   AST 30 10/10/2022   ALT 23 10/10/2022   ALKPHOS 176 (H) 10/10/2022   BILITOT 0.4 10/10/2022   GFRNONAA 42 (L) 10/10/2022    Lab Results  Component Value Date   WBC 3.9 (L) 10/10/2022   NEUTROABS 2.8 10/10/2022   HGB 8.5 (L) 10/10/2022   HCT 27.0 (L) 10/10/2022   MCV 112.5 (H) 10/10/2022   PLT 58 (L) 10/10/2022     STUDIES: No results found.  ONCOLOGY HISTORY:  Diagnosis confirmed from bone biopsy on August 17, 2020.  Initially her lambda free light  chains were elevated at 432.1, but now are within normal limits at 12.1.  Immunoglobulins and SPEP are normal.  Bone marrow biopsy completed on August 26, 2020  revealed 25% plasma cells along with the deletion of 1p which is considered poor prognosis.  PET scan results from September 06, 2020 reviewed independently with 3 hypermetabolic lesions in right C5-6 facets, right iliac crest lesion, and L3 vertebral lesion.  After lengthy discussion with the patient, she agreed to pursue chemotherapy with daratumumab, Velcade, Revlimid, and dexamethasone followed by autologous bone marrow transplant.  Patient received weekly daratumumab for 4 cycles along with Velcade on days 1, 4, 8, and 11.  She also received Revlimid on days 1 through 14 of a 21-day cycle.  She completed cycle 4 of treatment on January 14, 2021.  Subsequent bone marrow biopsy on January 24, 2021 revealed complete remission with no evidence of myeloma.  Patient underwent autologous stem cell transplant at [facility] on February 24, 2021.  She was readmitted to the hospital on March 04, 2021 with neutropenic fever, diarrhea, and sepsis-like symptoms.  Infectious disease work-up was negative.   Patient previously received consolidation treatment with the GRIFFIN regimen.  She received consolidation with cycle 5 and 6 using daratumumab on day 1, Velcade on day 1, 4, 8, and 11, Revlimid 10 mg on days 1 through 14 with 7 days off, and weekly dexamethasone on a 21-day cycle.  She was on maintenance daratumumab on day 1 and Revlimid 10 mg on days 1 through 21 on a 28-day cycle for cycles 7 through 32.  Her most recent PET scan on July 31, 2022 reviewed independently and report as above with no obvious evidence of recurrent disease.  A second bone marrow biopsy was completed on August 08, 2022 which also revealed no evidence of disease with normal cytogenetics.    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  ITP.  PLAN:      Lambda light chain myeloma with 1p del:  Given her persistent pancytopenia, maintenance treatment has been discontinued. If patient's pancytopenia resolves,  can consider reinitiating treatment with 5 mg Revlimid.  Proceed with Zometa today.  Return to clinic in 4 weeks for further evaluation and continuation of treatment. Thrombocytopenia: Improving.  Patient's platelet count has trended up to 58 with discontinuation of Revlimid.  No treatment or transfusion is needed at this time.  Monitor. Anemia: Patient's hemoglobin is trending up and is now 8.5. Leukopenia: Patient's white blood cell count is also trending up and is now 3.9. Hypocalcemia: Resolved.  Proceed with Zometa as above. Renal insufficiency: Improving.  Patient's most recent creatinine is 1.38. Vaccinations: Continue revaccination schedule as per [facility]. Pulmonary nodule: Patient noted to have a 9 mm hypermetabolic pulmonary nodule in her left lower lobe lung.  Possibly infectious, continue to monitor closely. Left knee pain: Continue conservative management.  Patient expressed understanding and was in agreement with this plan. She also understands that She can call clinic at any time with any questions, concerns, or complaints.      Cancer Staging  Lambda light chain myeloma (HCC) Staging form: Plasma Cell Myeloma and Plasma Cell Disorders, AJCC 8th Edition - Clinical stage from 09/16/2020: RISS Stage I (Beta-2-microglobulin (mg/L): 2.5, Albumin (g/dL): 4.9, ISS: Stage I, High-risk cytogenetics: Absent, LDH: Normal) - Signed by Jeralyn Ruths, MD on 09/16/2020 Beta 2 microglobulin range (mg/L): Less than 3.5 Albumin range (g/dL): Greater than or equal to 3.5 Cytogenetics: 1p  deletion  Jeralyn Ruths, MD   10/10/2022 3:49 PM

## 2022-10-11 ENCOUNTER — Ambulatory Visit
Admission: RE | Admit: 2022-10-11 | Discharge: 2022-10-11 | Disposition: A | Discharge status: Home / Self Care | Payer: Medicare PPO | Source: Ambulatory Visit | Attending: Internal Medicine | Admitting: Internal Medicine

## 2022-10-11 DIAGNOSIS — Z1231 Encounter for screening mammogram for malignant neoplasm of breast: Secondary | ICD-10-CM | POA: Insufficient documentation

## 2022-10-12 ENCOUNTER — Encounter: Payer: Self-pay | Admitting: Oncology

## 2022-10-13 ENCOUNTER — Other Ambulatory Visit: Payer: Self-pay | Admitting: *Deleted

## 2022-10-13 MED ORDER — ALPRAZOLAM 0.5 MG PO TABS: 0.5000 mg | ORAL_TABLET | Freq: Every evening | ORAL | 0 refills | Status: DC | PRN

## 2022-10-20 ENCOUNTER — Other Ambulatory Visit: Payer: Self-pay | Admitting: Oncology

## 2022-10-20 DIAGNOSIS — R200 Anesthesia of skin: Secondary | ICD-10-CM

## 2022-10-20 IMAGING — MR MR THORACIC SPINE WO/W CM
6 of 9 series · 29 of 48 positions shown · IV contrast (gadavist)
Comparison: Thoracic spine MRI 10/19/2020 CT chest 01/24/2021

CLINICAL DATA: Pain, history of myeloma

EXAM:
MRI THORACIC WITHOUT AND WITH CONTRAST
TECHNIQUE: Multiplanar and multiecho pulse sequences of the thoracic spine were
obtained without and with intravenous contrast.
CONTRAST:  5mL GADAVIST GADOBUTROL 1 MMOL/ML IV SOLN

[Series 18: T1 · sagittal · 6.0mm · 1.88mm/px · 3 of 9 slices shown (1 of 3)]
[im 1/9]
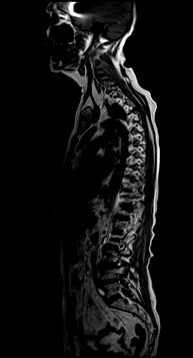
[im 5/9]
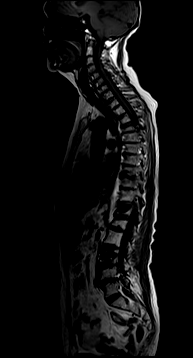
[im 9/9]
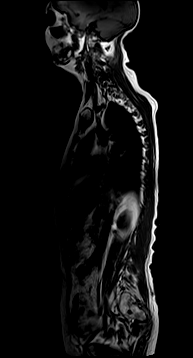

[Series 19: T2 · sagittal · 3.0mm · 1.06mm/px · 4 of 17 slices shown (1 of 2)]
[im 1/17]
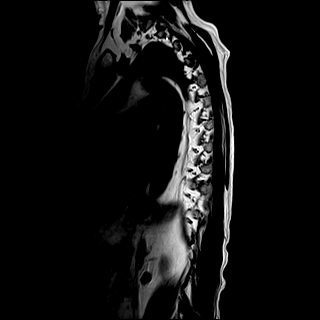
[im 6/17]
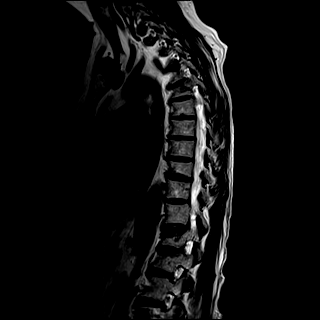
[im 11/17]
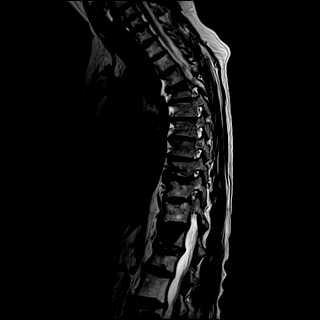
[im 17/17]
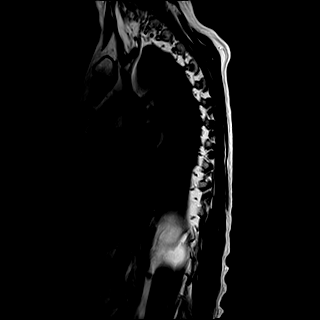

[Series 20: T1 · sagittal · 3.0mm · 1.06mm/px · 3 of 17 slices shown (2 of 3)]
[im 1/17]
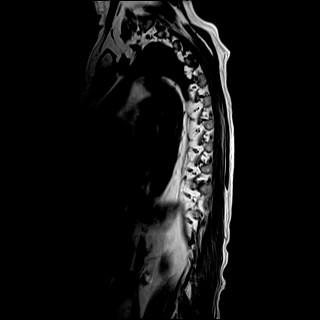
[im 9/17]
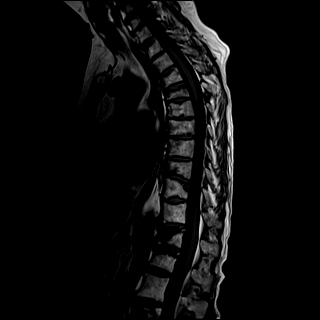
[im 17/17]
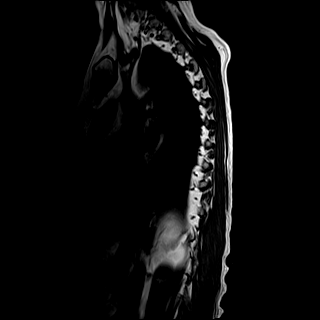

[Series 22: T2 · axial · 4.0mm · 0.59mm/px · z∈[-314,-145]mm · 8 of 40 slices shown (2 of 2)]
[im 1/40]
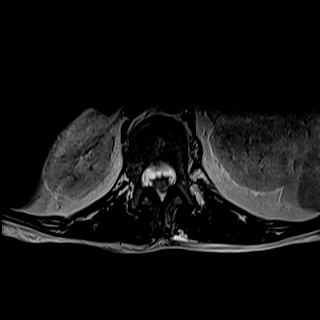
[im 6/40]
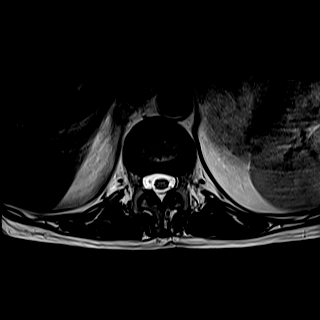
[im 12/40]
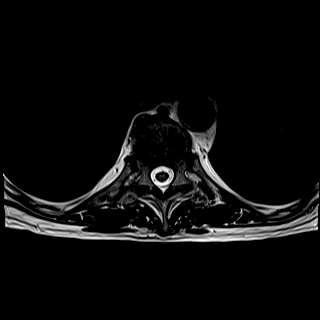
[im 17/40]
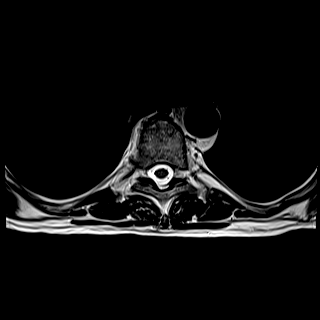
[im 23/40]
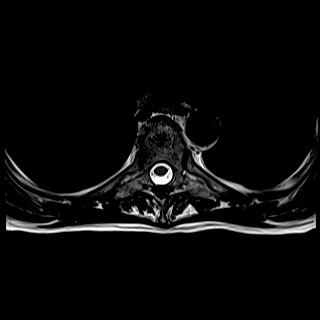
[im 28/40]
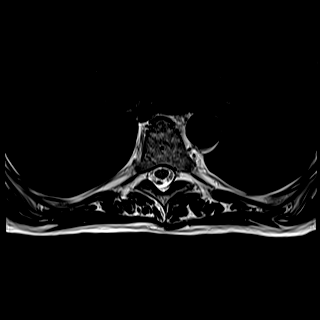
[im 34/40]
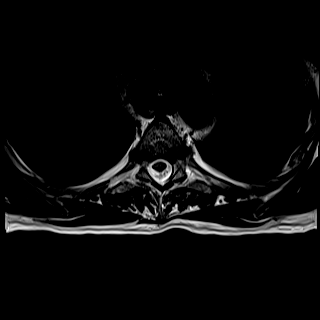
[im 40/40]
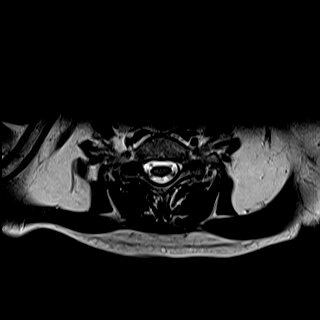

[Series 24: T1 · axial · non-contrast · 4.0mm · 0.37mm/px · z∈[-314,-145]mm · 8 of 40 slices shown (3 of 3)]
[im 1/40]
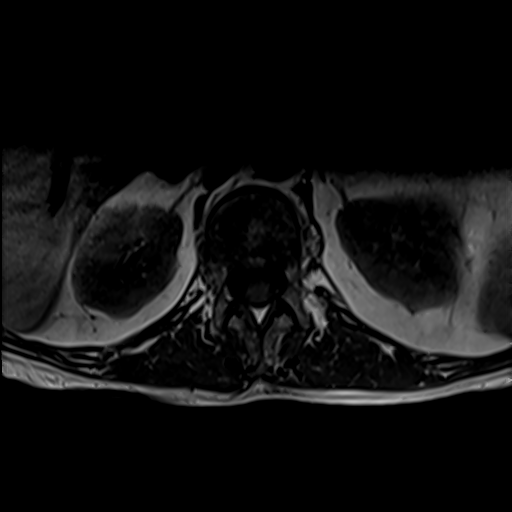
[im 6/40]
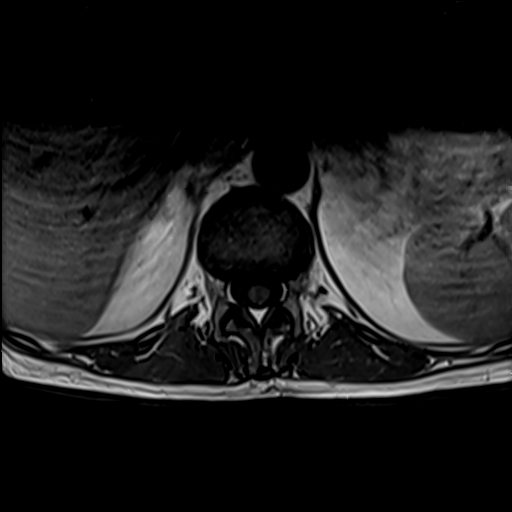
[im 12/40]
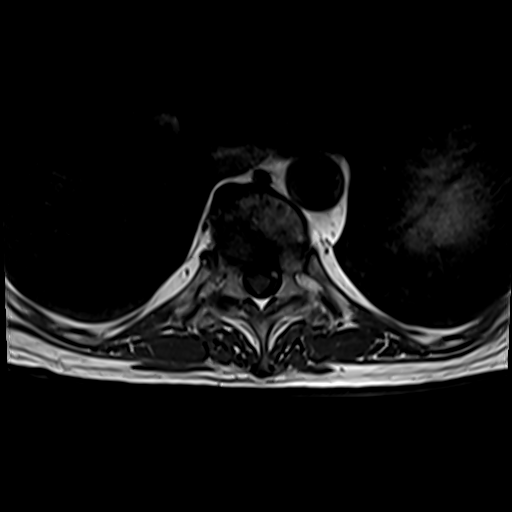
[im 17/40]
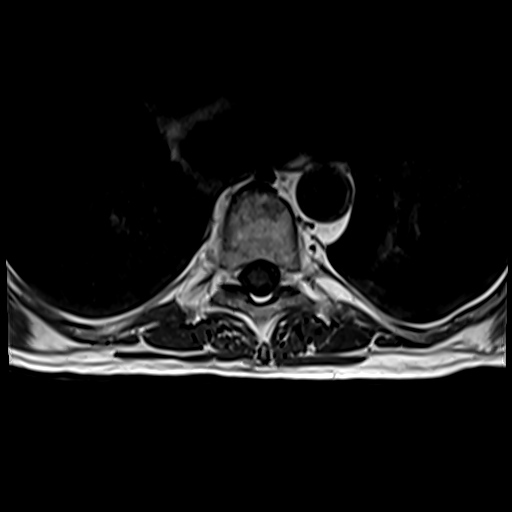
[im 23/40]
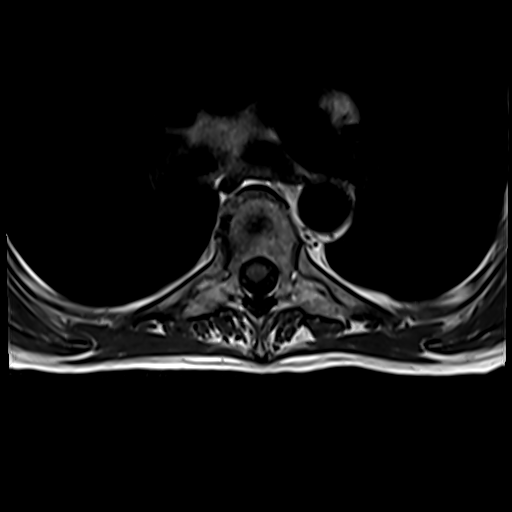
[im 28/40]
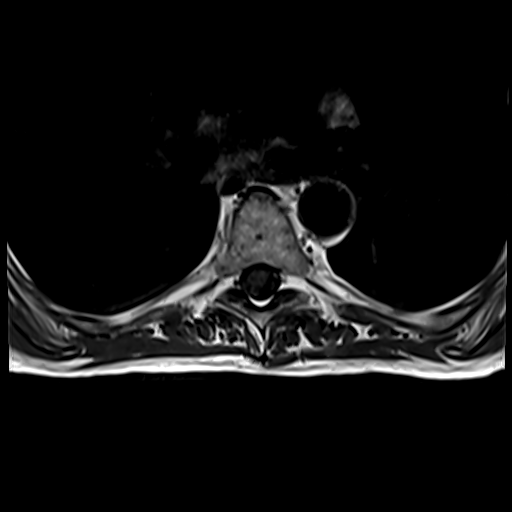
[im 34/40]
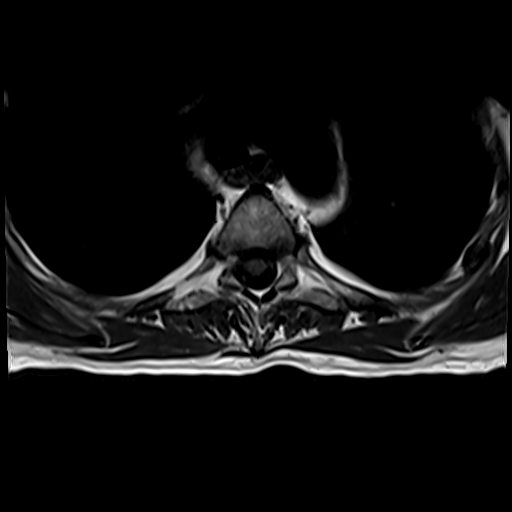
[im 40/40]
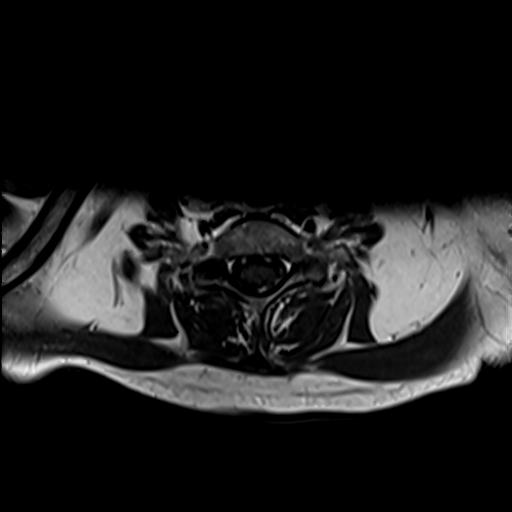

[Series 26: T1 fat-sat post-contrast · sagittal · 3.0mm · 1.06mm/px · 3 of 17 slices shown]
[im 1/17]
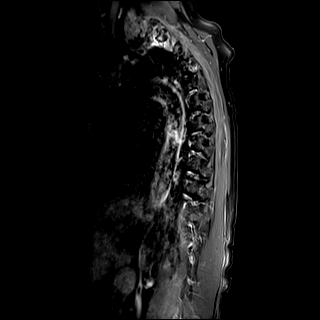
[im 9/17]
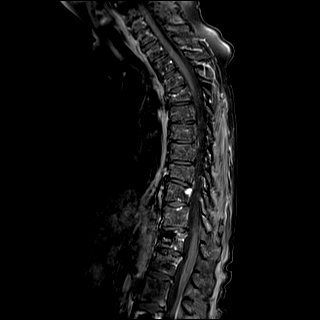
[im 17/17]
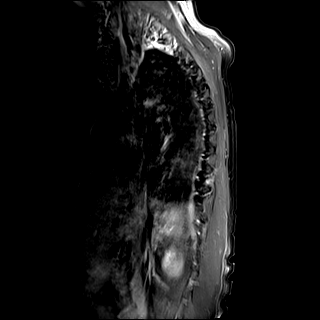

[29 of 48 positions shown; findings below may reference images not displayed]

FINDINGS: Alignment: There is no antero or retrolisthesis. There is minimal
levoscoliosis.

Vertebrae: Previously seen T1 hypointense, stir hyperintense,
enhancing lesions throughout the thoracic spine bone marrow have
overall decreased in conspicuity. Discrete lesions remain visible in
the T8 and T10 vertebral bodies measuring approximately 6 mm at both
levels, decreased in size from approximately 1.1 cm at T8 and 1.5 cm
at T10. An approximately 4 mm lesion in the L1 vertebral body is
decreased in size from 7 mm. No other measurable lesions are
identified. There is no evidence of soft tissue tumor in the spinal
canal

There is minimal compression deformity of the T2 vertebral body, not
significantly changed. There is mild compression deformity of the T4
vertebral body with approximately 25% loss of vertebral body height
anteriorly with no bone marrow edema, similar to the prior study.

There is compression deformity of the T5 vertebral body with
approximately 40% loss of vertebral body height anteriorly with
faint marrow edema along the superior endplate. The compression
deformity appears slightly worsened compared to the prior study,
with presence of edema suggesting superimposed acute to subacute
fracture.

There is mild compression deformity of the inferior T9 endplate
without marrow edema to suggest acute fracture, though this is new
since 10/19/2020.

Compression deformity of the T12 vertebral body with minimal bony
retropulsion is similar to the prior study with evidence of interval
vertebral augmentation.

There is mild compression deformity of the L1 vertebral body with
faint marrow edema and approximately 10% loss of vertebral body
height anteriorly and minimal bony retropulsion. The fracture is new
since 10/19/2020.

Post vertebral augmentation changes are also noted at L3,
incompletely evaluated.

The other vertebral body heights are preserved.

A remote fracture of the posterior left eighth rib is again seen
(25-23).

Cord:  Normal in signal and morphology.

Paraspinal and other soft tissues: Unremarkable.

Disc levels:

There is mild multilevel degenerative endplate change and facet
arthropathy throughout the thoracic spine. As above, there is
minimal bony retropulsion at T12 and L1 but without significant
spinal canal or neural foraminal stenosis, and no evidence of cord
compression. Otherwise, there is no significant spinal canal or
neural foraminal stenosis at the other levels. There is no evidence
of cord or nerve root compression.
IMPRESSION: 1. Overall decreased conspicuity of previously seen marrow replacing
lesions throughout the thoracic spine on the study of 10/19/2020,
with residual enhancing lesions in the T8, T10, and L1 vertebral
bodies decreased in size in the interim. There is no evidence of
soft tissue extension of tumor in the canal.
2. Faint marrow edema along the superior T5 endplate suggesting
acute to subacute injury superimposed on pre-existing pathologic
compression fracture with slightly increased loss of vertebral body
height since 10/19/2020, but no bony retropulsion.
3. Mild subacute appearing compression deformity of the L1 vertebral
body, new since 10/19/2020, with minimal bony retropulsion but no
significant canal stenosis or cord compression. Additionally, mild
compression deformity of the inferior T9 endplate is new since
10/19/2020, though without marrow edema to suggest recent fracture.
4. Compression deformity of the T4 vertebral body without bony
retropulsion, unchanged. Post vertebral augmentation changes are
noted at T12 and L3, incompletely evaluated at L3.

## 2022-11-03 ENCOUNTER — Other Ambulatory Visit: Payer: Self-pay

## 2022-11-03 DIAGNOSIS — D696 Thrombocytopenia, unspecified: Secondary | ICD-10-CM

## 2022-11-03 DIAGNOSIS — C9000 Multiple myeloma not having achieved remission: Secondary | ICD-10-CM

## 2022-11-06 ENCOUNTER — Inpatient Hospital Stay: Payer: Medicare PPO

## 2022-11-06 ENCOUNTER — Inpatient Hospital Stay (HOSPITAL_BASED_OUTPATIENT_CLINIC_OR_DEPARTMENT_OTHER): Payer: Medicare PPO | Admitting: Oncology

## 2022-11-06 ENCOUNTER — Inpatient Hospital Stay: Payer: Medicare PPO | Attending: Oncology

## 2022-11-06 ENCOUNTER — Encounter: Payer: Self-pay | Admitting: Oncology

## 2022-11-06 VITALS — BP 128/57 | HR 85 | Temp 98.2°F | Resp 16 | Ht 62.0 in | Wt 141.0 lb

## 2022-11-06 VITALS — BP 103/63 | HR 78

## 2022-11-06 DIAGNOSIS — N289 Disorder of kidney and ureter, unspecified: Secondary | ICD-10-CM | POA: Diagnosis not present

## 2022-11-06 DIAGNOSIS — Z79899 Other long term (current) drug therapy: Secondary | ICD-10-CM | POA: Insufficient documentation

## 2022-11-06 DIAGNOSIS — C9000 Multiple myeloma not having achieved remission: Secondary | ICD-10-CM

## 2022-11-06 DIAGNOSIS — D649 Anemia, unspecified: Secondary | ICD-10-CM | POA: Diagnosis not present

## 2022-11-06 DIAGNOSIS — D696 Thrombocytopenia, unspecified: Secondary | ICD-10-CM

## 2022-11-06 DIAGNOSIS — D693 Immune thrombocytopenic purpura: Secondary | ICD-10-CM | POA: Insufficient documentation

## 2022-11-06 DIAGNOSIS — C9001 Multiple myeloma in remission: Secondary | ICD-10-CM | POA: Insufficient documentation

## 2022-11-06 LAB — CBC WITH DIFFERENTIAL (CANCER CENTER ONLY)
Abs Immature Granulocytes: 0.02 10*3/uL (ref 0.00–0.07)
Basophils Absolute: 0 10*3/uL (ref 0.0–0.1)
Basophils Relative: 0 %
Eosinophils Absolute: 0.1 10*3/uL (ref 0.0–0.5)
Eosinophils Relative: 2 %
HCT: 28.2 % — ABNORMAL LOW (ref 36.0–46.0)
Hemoglobin: 9.1 g/dL — ABNORMAL LOW (ref 12.0–15.0)
Immature Granulocytes: 0 %
Lymphocytes Relative: 12 %
Lymphs Abs: 0.5 10*3/uL — ABNORMAL LOW (ref 0.7–4.0)
MCH: 36.4 pg — ABNORMAL HIGH (ref 26.0–34.0)
MCHC: 32.3 g/dL (ref 30.0–36.0)
MCV: 112.8 fL — ABNORMAL HIGH (ref 80.0–100.0)
Monocytes Absolute: 0.4 10*3/uL (ref 0.1–1.0)
Monocytes Relative: 9 %
Neutro Abs: 3.4 10*3/uL (ref 1.7–7.7)
Neutrophils Relative %: 77 %
Platelet Count: 75 10*3/uL — ABNORMAL LOW (ref 150–400)
RBC: 2.5 MIL/uL — ABNORMAL LOW (ref 3.87–5.11)
RDW: 13.7 % (ref 11.5–15.5)
WBC Count: 4.5 10*3/uL (ref 4.0–10.5)
nRBC: 0 % (ref 0.0–0.2)

## 2022-11-06 LAB — CMP (CANCER CENTER ONLY)
ALT: 20 U/L (ref 0–44)
AST: 23 U/L (ref 15–41)
Albumin: 3.8 g/dL (ref 3.5–5.0)
Alkaline Phosphatase: 122 U/L (ref 38–126)
Anion gap: 6 (ref 5–15)
BUN: 17 mg/dL (ref 8–23)
CO2: 23 mmol/L (ref 22–32)
Calcium: 8.7 mg/dL — ABNORMAL LOW (ref 8.9–10.3)
Chloride: 110 mmol/L (ref 98–111)
Creatinine: 1.44 mg/dL — ABNORMAL HIGH (ref 0.44–1.00)
GFR, Estimated: 40 mL/min — ABNORMAL LOW (ref >60)
Glucose, Bld: 241 mg/dL — ABNORMAL HIGH (ref 70–99)
Potassium: 3.8 mmol/L (ref 3.5–5.1)
Sodium: 139 mmol/L (ref 135–145)
Total Bilirubin: 0.4 mg/dL (ref 0.3–1.2)
Total Protein: 6.1 g/dL — ABNORMAL LOW (ref 6.5–8.1)

## 2022-11-06 MED ORDER — ZOLEDRONIC ACID 4 MG/5ML IV CONC
3.3000 mg | Freq: Once | INTRAVENOUS | Status: AC
  Administered 2022-11-06: 3.3 mg via INTRAVENOUS
  Filled 2022-11-06: qty 4.13

## 2022-11-06 MED ORDER — SODIUM CHLORIDE 0.9% FLUSH
10.0000 mL | Freq: Once | INTRAVENOUS | Status: AC
  Administered 2022-11-06: 10 mL via INTRAVENOUS
  Filled 2022-11-06: qty 10

## 2022-11-06 MED ORDER — HEPARIN SOD (PORK) LOCK FLUSH 100 UNIT/ML IV SOLN
500.0000 [IU] | Freq: Once | INTRAVENOUS | Status: AC
  Administered 2022-11-06: 500 [IU] via INTRAVENOUS
  Filled 2022-11-06: qty 5

## 2022-11-06 MED ORDER — SODIUM CHLORIDE 0.9 % IV SOLN
INTRAVENOUS | Status: DC
  Filled 2022-11-06: qty 250

## 2022-11-06 NOTE — Patient Instructions (Signed)

## 2022-11-06 NOTE — Progress Notes (Signed)
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  ITP.  INTERVAL HISTORY: Patient returns to clinic today for repeat laboratory work and continuation of Zometa.  She continues to have chronic left knee and back pain that is unchanged.  The peripheral neuropathy in her feet is also unchanged.  She otherwise feels well.  She has no other neurologic complaints.  She denies any chest pain, shortness of breath, cough, or hemoptysis.  She denies any nausea, vomiting, constipation, or diarrhea.  She has no urinary complaints.  Patient offers no further specific complaints today.  REVIEW OF SYSTEMS:   Review of Systems  Constitutional: Negative.  Negative for fever, malaise/fatigue and weight loss.  HENT: Negative.  Negative for congestion and sinus pain.   Respiratory: Negative.  Negative for cough, hemoptysis and shortness of breath.   Cardiovascular:  Negative for chest pain and leg swelling.  Gastrointestinal: Negative.  Negative for abdominal pain, diarrhea and nausea.  Genitourinary: Negative.  Negative for dysuria.  Musculoskeletal:  Positive for back pain and joint pain. Negative for neck pain.  Skin: Negative.  Negative for rash.  Neurological:  Positive for sensory change. Negative for dizziness, tremors, focal weakness, weakness and headaches.  Endo/Heme/Allergies: Negative.  Does not bruise/bleed easily.  Psychiatric/Behavioral: Negative.  The patient is not nervous/anxious and does not have insomnia.     As per HPI. Otherwise, a complete review of systems is negative.  PAST MEDICAL HISTORY: Past Medical History:  Diagnosis Date   Anxiety    Diabetes mellitus without  complication (HCC) 2016   GERD (gastroesophageal reflux disease)    Hyperlipidemia    Myeloma (HCC)    Myeloma (HCC)    Personal history of colonic polyps    Squamous cell carcinoma of skin 11/25/2013   Left chest. WD SCC with superficial infiltration.   Tachycardia     PAST SURGICAL HISTORY: Past Surgical History:  Procedure Laterality Date   AUGMENTATION MAMMAPLASTY Bilateral 1983   silicone and replacement in 2001   BREAST ENHANCEMENT SURGERY  1983   CESAREAN SECTION  1987, 1989   COLONOSCOPY  1988, 1998,2003, 2008, 2013   COLONOSCOPY WITH PROPOFOL N/A 08/16/2016   Procedure: COLONOSCOPY WITH PROPOFOL;  Surgeon: Earline Mayotte, MD;  Location: [facility] ENDOSCOPY;  Service: Endoscopy;  Laterality: N/A;   COLONOSCOPY WITH PROPOFOL N/A 10/05/2021   Procedure: COLONOSCOPY WITH PROPOFOL;  Surgeon: Earline Mayotte, MD;  Location: [facility] ENDOSCOPY;  Service: Endoscopy;  Laterality: N/A;  HAS A PORT, NEEDS AMPICILLIN PER OFFICE   DILATION AND CURETTAGE OF UTERUS     ENDOMETRIAL ABLATION  12/1991   ganglion cyst removal      IR BONE MARROW BIOPSY & ASPIRATION  08/08/2022   IR IMAGING GUIDED PORT INSERTION  10/27/2020   IR IMAGING GUIDED PORT INSERTION  04/04/2021   KYPHOPLASTY N/A 10/28/2020   Procedure: T12 and L3 KYPHOPLASTY;  Surgeon: Kennedy Bucker, MD;  Location: [facility] ORS;  Service: Orthopedics;  Laterality: N/A;   TONSILLECTOMY     WRIST SURGERY Right 05/1995    FAMILY HISTORY: Family History  Problem Relation Age of Onset   Colon polyps Sister    Colon cancer Father 60   Diabetes Mother    Breast  cancer Neg Hx     ADVANCED DIRECTIVES (Y/N):  N  HEALTH MAINTENANCE: Social History   Tobacco Use   Smoking status: Former    Current packs/day: 0.00    Average packs/day: 1 pack/day for 4.0 years (4.0 ttl pk-yrs)    Types: Cigarettes    Start date: 39    Quit date: 1982    Years since quitting: 42.7   Smokeless tobacco: Never  Vaping Use   Vaping status: Never Used   Substance Use Topics   Alcohol use: Yes    Alcohol/week: 1.0 - 2.0 standard drink of alcohol    Types: 1 - 2 Glasses of wine per week    Comment: occassionally   Drug use: No     Colonoscopy:  PAP:  Bone density:  Lipid panel:  No Known Allergies  Current Outpatient Medications  Medication Sig Dispense Refill   acetaminophen (TYLENOL) 325 MG tablet Take 650 mg by mouth 2 (two) times a week. Premed for velcade. Taking Tuesdays and Friday     acyclovir (ZOVIRAX) 400 MG tablet TAKE 1 TABLET BY MOUTH TWICE DAILY 60 tablet 5   albuterol (VENTOLIN HFA) 108 (90 Base) MCG/ACT inhaler Inhale 2 puffs into the lungs every 4 (four) hours as needed.     ALPRAZolam (XANAX) 0.5 MG tablet Take 1 tablet (0.5 mg total) by mouth at bedtime as needed for anxiety. 30 tablet 0   ascorbic acid (VITAMIN C) 500 MG tablet Take 500 mg by mouth daily.     atorvastatin (LIPITOR) 20 MG tablet Take 20 mg by mouth daily.     busPIRone (BUSPAR) 30 MG tablet Take 30 mg by mouth 2 (two) times daily.     CALCIUM-VITAMIN D PO Take by mouth 2 (two) times daily.     cyclobenzaprine (FLEXERIL) 10 MG tablet TAKE 1 TABLET BY MOUTH AT BEDTIME 30 tablet 2   diphenhydrAMINE (BENADRYL) 25 mg capsule Take 25 mg by mouth 2 (two) times a week. Pre med for velcade. Only Tuesdays and Fridays     diphenoxylate-atropine (LOMOTIL) 2.5-0.025 MG tablet Take 1-2 tablets by mouth every 8 (eight) hours as needed for diarrhea or loose stools. 90 tablet 0   eltrombopag (PROMACTA) 50 MG tablet Take 1 tablet (50 mg total) by mouth daily. Take on an empty stomach 1 hour before a meal or 2 hours after 30 tablet 0   ferrous sulfate 325 (65 FE) MG tablet Take 325 mg by mouth daily with breakfast.     gabapentin (NEURONTIN) 300 MG capsule TAKE 1 CAPSULE BY MOUTH TWICE DAILY 60 capsule 2   ivabradine (CORLANOR) 5 MG TABS tablet Take 5 mg by mouth 2 (two) times daily with a meal.     lidocaine-prilocaine (EMLA) cream APPLY 1 APPLICATION TOPICALLY AS  NEEDED 30 g 2   liraglutide (VICTOZA) 18 MG/3ML SOPN Inject 1.2 mg into the skin daily.     Magnesium 400 MG TABS Take 1 tablet by mouth daily.     meloxicam (MOBIC) 15 MG tablet Take 15 mg by mouth daily.     Multiple Vitamin (MULTI-VITAMIN) tablet Take 1 tablet by mouth daily.     omeprazole (PRILOSEC) 40 MG capsule Take 40 mg by mouth daily.     ondansetron (ZOFRAN) 8 MG tablet Take 1 tablet (8 mg total) by mouth 2 (two) times daily as needed (Nausea or vomiting). 60 tablet 2   oxyCODONE-acetaminophen (PERCOCET/ROXICET) 5-325 MG tablet TAKE 1-2 TABLETS BY MOUTH EVERY 4 HOURS AS  NEEDED FOR SEVERE PAIN 90 tablet 0   prochlorperazine (COMPAZINE) 10 MG tablet TAKE 1 TABLET BY MOUTH EVERY 6 HOURS AS NEEDED FOR NAUSEA OR VOMITING 60 tablet 2   traMADol (ULTRAM) 50 MG tablet TAKE 1 TABLET BY MOUTH EVERY 6 HOURS AS NEEDED FOR PAIN 60 tablet 2   dexamethasone (DECADRON) 4 MG tablet Take 4 mg by mouth daily. 5 tabs po q28 days (Patient not taking: Reported on 10/10/2022)     methocarbamol (ROBAXIN) 500 MG tablet Take 1 tablet (500 mg total) by mouth every 6 (six) hours as needed for muscle spasms. (Patient not taking: Reported on 10/10/2022) 40 tablet 1   predniSONE (DELTASONE) 20 MG tablet Take 3 tablets (60 mg total) by mouth daily with breakfast. (Patient not taking: Reported on 10/10/2022) 21 tablet 0   No current facility-administered medications for this visit.   Facility-Administered Medications Ordered in Other Visits  Medication Dose Route Frequency Provider Last Rate Last Admin   0.9 %  sodium chloride infusion   Intravenous Continuous Jeralyn Ruths, MD   Stopped at 04/07/21 1142   0.9 %  sodium chloride infusion   Intravenous Continuous Jeralyn Ruths, MD   Stopped at 11/06/22 1437   heparin lock flush 100 unit/mL  500 Units Intravenous Once Jeralyn Ruths, MD       sodium chloride flush (NS) 0.9 % injection 10 mL  10 mL Intravenous PRN Jeralyn Ruths, MD   10 mL at  11/23/20 0932   sodium chloride flush (NS) 0.9 % injection 10 mL  10 mL Intravenous PRN Jeralyn Ruths, MD   10 mL at 04/18/22 1417   sodium chloride flush (NS) 0.9 % injection 10 mL  10 mL Intravenous Once Jeralyn Ruths, MD        OBJECTIVE: Vitals:   11/06/22 1311  BP: (!) 128/57  Pulse: 85  Resp: 16  Temp: 98.2 F (36.8 C)  SpO2: 99%     Body mass index is 25.79 kg/m.    ECOG FS:0 - Asymptomatic  General: Well-developed, well-nourished, no acute distress. Eyes: Pink conjunctiva, anicteric sclera. HEENT: Normocephalic, moist mucous membranes. Lungs: No audible wheezing or coughing. Heart: Regular rate and rhythm. Abdomen: Soft, nontender, no obvious distention. Musculoskeletal: No edema, cyanosis, or clubbing. Neuro: Alert, answering all questions appropriately. Cranial nerves grossly intact. Skin: No rashes or petechiae noted. Psych: Normal affect.  LAB RESULTS:  Lab Results  Component Value Date   NA 139 11/06/2022   K 3.8 11/06/2022   CL 110 11/06/2022   CO2 23 11/06/2022   GLUCOSE 241 (H) 11/06/2022   BUN 17 11/06/2022   CREATININE 1.44 (H) 11/06/2022   CALCIUM 8.7 (L) 11/06/2022   PROT 6.1 (L) 11/06/2022   ALBUMIN 3.8 11/06/2022   AST 23 11/06/2022   ALT 20 11/06/2022   ALKPHOS 122 11/06/2022   BILITOT 0.4 11/06/2022   GFRNONAA 40 (L) 11/06/2022    Lab Results  Component Value Date   WBC 4.5 11/06/2022   NEUTROABS 3.4 11/06/2022   HGB 9.1 (L) 11/06/2022   HCT 28.2 (L) 11/06/2022   MCV 112.8 (H) 11/06/2022   PLT 75 (L) 11/06/2022     STUDIES: MM 3D SCREEN BREAST W/IMPLANT BILATERAL  Result Date: 10/12/2022 CLINICAL DATA:  Screening. EXAM: DIGITAL SCREENING BILATERAL MAMMOGRAM WITH IMPLANTS, CAD AND TOMOSYNTHESIS TECHNIQUE: Bilateral screening digital craniocaudal and mediolateral oblique mammograms were obtained. Bilateral screening digital breast tomosynthesis was performed. The images were evaluated with computer-aided detection.  Standard and/or implant displaced views were performed. COMPARISON:  Previous exam(s). ACR Breast Density Category c: The breasts are heterogeneously dense, which may obscure small masses. FINDINGS: The patient has retropectoral implants. There are no findings suspicious for malignancy. IMPRESSION: No mammographic evidence of malignancy. A result letter of this screening mammogram will be mailed directly to the patient. RECOMMENDATION: Screening mammogram in one year. (Code:SM-B-01Y) BI-RADS CATEGORY  1: Negative. Electronically Signed   By: Frederico Hamman M.D.   On: 10/12/2022 14:22    ONCOLOGY HISTORY:  Diagnosis confirmed from bone biopsy on August 17, 2020.  Initially her lambda free light chains were elevated at 432.1, but now are within normal limits at 12.1.  Immunoglobulins and SPEP are normal.  Bone marrow biopsy completed on August 26, 2020 revealed 25% plasma cells along with the deletion of 1p which is considered poor prognosis.  PET scan results from September 06, 2020 reviewed independently with 3 hypermetabolic lesions in right C5-6 facets, right iliac crest lesion, and L3 vertebral lesion.  After lengthy discussion with the patient, she agreed to pursue chemotherapy with daratumumab, Velcade, Revlimid, and dexamethasone followed by autologous bone marrow transplant.  Patient received weekly daratumumab for 4 cycles along with Velcade on days 1, 4, 8, and 11.  She also received Revlimid on days 1 through 14 of a 21-day cycle.  She completed cycle 4 of treatment on January 14, 2021.  Subsequent bone marrow biopsy on January 24, 2021 revealed complete remission with no evidence of myeloma.  Patient underwent autologous stem cell transplant at [facility] on February 24, 2021.  She was readmitted to the hospital on March 04, 2021 with neutropenic fever, diarrhea, and sepsis-like symptoms.  Infectious disease work-up was negative.   Patient previously received consolidation treatment with the GRIFFIN  regimen.  She received consolidation with cycle 5 and 6 using daratumumab on day 1, Velcade on day 1, 4, 8, and 11, Revlimid 10 mg on days 1 through 14 with 7 days off, and weekly dexamethasone on a 21-day cycle.  She was on maintenance daratumumab on day 1 and Revlimid 10 mg on days 1 through 21 on a 28-day cycle for cycles 7 through 32.  Her most recent PET scan on July 31, 2022 reviewed independently and report as above with no obvious evidence of recurrent disease.  A second bone marrow biopsy was completed on August 08, 2022 which also revealed no evidence of disease with normal cytogenetics.    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  ITP.  PLAN:      Lambda light chain myeloma with 1p del:  Given her persistent pancytopenia, maintenance Revlimid was discontinued in approximately July 2024.  Patient reports she has a repeat bone marrow biopsy at [facility]. If patient's pancytopenia resolves, can consider reinitiating treatment with 5 mg Revlimid.  Proceed with Zometa today.  Return to clinic in 4 weeks for further evaluation and continuation of treatment.   Thrombocytopenia: Improving.  Patient's platelet count has now trended up to 75 with discontinuation of Revlimid.  No treatment or transfusion is needed at this time.  Monitor. Anemia: Chronic and unchanged.  Patient's hemoglobin is 9.1. Leukopenia: Resolved.   Hypocalcemia: Resolved.  Proceed with Zometa as above. Renal insufficiency: Chronic and unchanged.  Patient's creatinine is 1.44. Vaccinations: Continue revaccination schedule as per [facility]. Pulmonary nodule: Patient noted to have a 9 mm hypermetabolic pulmonary nodule in her left lower lobe lung.  Possibly  infectious, continue to monitor closely. Left knee pain: Chronic and unchanged.  Continue conservative management.  Patient expressed understanding and was in agreement with this plan. She also understands that She can call  clinic at any time with any questions, concerns, or complaints.      Cancer Staging  Lambda light chain myeloma (HCC) Staging form: Plasma Cell Myeloma and Plasma Cell Disorders, AJCC 8th Edition - Clinical stage from 09/16/2020: RISS Stage I (Beta-2-microglobulin (mg/L): 2.5, Albumin (g/dL): 4.9, ISS: Stage I, High-risk cytogenetics: Absent, LDH: Normal) - Signed by Jeralyn Ruths, MD on 09/16/2020 Beta 2 microglobulin range (mg/L): Less than 3.5 Albumin range (g/dL): Greater than or equal to 3.5 Cytogenetics: 1p deletion  Jeralyn Ruths, MD   11/06/2022 4:06 PM

## 2022-11-07 LAB — KAPPA/LAMBDA LIGHT CHAINS
Kappa free light chain: 6.9 mg/L (ref 3.3–19.4)
Kappa, lambda light chain ratio: 0.97 (ref 0.26–1.65)
Lambda free light chains: 7.1 mg/L (ref 5.7–26.3)

## 2022-11-15 ENCOUNTER — Encounter: Payer: Self-pay | Admitting: Oncology

## 2022-11-16 ENCOUNTER — Other Ambulatory Visit: Payer: Self-pay | Admitting: Oncology

## 2022-11-16 DIAGNOSIS — C9000 Multiple myeloma not having achieved remission: Secondary | ICD-10-CM

## 2022-11-18 ENCOUNTER — Other Ambulatory Visit: Payer: Self-pay | Admitting: Oncology

## 2022-11-18 DIAGNOSIS — C9000 Multiple myeloma not having achieved remission: Secondary | ICD-10-CM

## 2022-11-20 ENCOUNTER — Other Ambulatory Visit: Payer: Self-pay | Admitting: Oncology

## 2022-11-21 ENCOUNTER — Encounter: Payer: Self-pay | Admitting: Oncology

## 2022-11-30 ENCOUNTER — Other Ambulatory Visit: Payer: Self-pay | Admitting: Oncology

## 2022-12-01 ENCOUNTER — Other Ambulatory Visit: Payer: Self-pay | Admitting: *Deleted

## 2022-12-01 DIAGNOSIS — C9000 Multiple myeloma not having achieved remission: Secondary | ICD-10-CM

## 2022-12-04 ENCOUNTER — Inpatient Hospital Stay: Payer: Medicare PPO

## 2022-12-04 ENCOUNTER — Inpatient Hospital Stay (HOSPITAL_BASED_OUTPATIENT_CLINIC_OR_DEPARTMENT_OTHER): Payer: Medicare PPO | Admitting: Oncology

## 2022-12-04 ENCOUNTER — Inpatient Hospital Stay: Payer: Medicare PPO | Attending: Oncology

## 2022-12-04 ENCOUNTER — Encounter: Payer: Self-pay | Admitting: Oncology

## 2022-12-04 VITALS — BP 140/70 | HR 83 | Temp 98.5°F | Resp 16 | Ht 62.0 in | Wt 139.0 lb

## 2022-12-04 DIAGNOSIS — N289 Disorder of kidney and ureter, unspecified: Secondary | ICD-10-CM | POA: Diagnosis not present

## 2022-12-04 DIAGNOSIS — Z7969 Long term (current) use of other immunomodulators and immunosuppressants: Secondary | ICD-10-CM | POA: Diagnosis not present

## 2022-12-04 DIAGNOSIS — C9001 Multiple myeloma in remission: Secondary | ICD-10-CM | POA: Diagnosis present

## 2022-12-04 DIAGNOSIS — C9000 Multiple myeloma not having achieved remission: Secondary | ICD-10-CM

## 2022-12-04 DIAGNOSIS — Z95828 Presence of other vascular implants and grafts: Secondary | ICD-10-CM

## 2022-12-04 DIAGNOSIS — M899 Disorder of bone, unspecified: Secondary | ICD-10-CM

## 2022-12-04 DIAGNOSIS — Z87891 Personal history of nicotine dependence: Secondary | ICD-10-CM | POA: Insufficient documentation

## 2022-12-04 DIAGNOSIS — D693 Immune thrombocytopenic purpura: Secondary | ICD-10-CM | POA: Diagnosis present

## 2022-12-04 DIAGNOSIS — Z7952 Long term (current) use of systemic steroids: Secondary | ICD-10-CM | POA: Insufficient documentation

## 2022-12-04 DIAGNOSIS — D72819 Decreased white blood cell count, unspecified: Secondary | ICD-10-CM | POA: Diagnosis not present

## 2022-12-04 DIAGNOSIS — Z79899 Other long term (current) drug therapy: Secondary | ICD-10-CM | POA: Insufficient documentation

## 2022-12-04 DIAGNOSIS — Z79624 Long term (current) use of inhibitors of nucleotide synthesis: Secondary | ICD-10-CM | POA: Insufficient documentation

## 2022-12-04 DIAGNOSIS — D649 Anemia, unspecified: Secondary | ICD-10-CM | POA: Diagnosis not present

## 2022-12-04 LAB — CBC WITH DIFFERENTIAL/PLATELET
Abs Immature Granulocytes: 0 10*3/uL (ref 0.00–0.07)
Basophils Absolute: 0 10*3/uL (ref 0.0–0.1)
Basophils Relative: 0 %
Eosinophils Absolute: 0.2 10*3/uL (ref 0.0–0.5)
Eosinophils Relative: 4 %
HCT: 33.7 % — ABNORMAL LOW (ref 36.0–46.0)
Hemoglobin: 11 g/dL — ABNORMAL LOW (ref 12.0–15.0)
Immature Granulocytes: 0 %
Lymphocytes Relative: 14 %
Lymphs Abs: 0.5 10*3/uL — ABNORMAL LOW (ref 0.7–4.0)
MCH: 35.9 pg — ABNORMAL HIGH (ref 26.0–34.0)
MCHC: 32.6 g/dL (ref 30.0–36.0)
MCV: 110.1 fL — ABNORMAL HIGH (ref 80.0–100.0)
Monocytes Absolute: 0.4 10*3/uL (ref 0.1–1.0)
Monocytes Relative: 12 %
Neutro Abs: 2.6 10*3/uL (ref 1.7–7.7)
Neutrophils Relative %: 70 %
Platelets: 76 10*3/uL — ABNORMAL LOW (ref 150–400)
RBC: 3.06 MIL/uL — ABNORMAL LOW (ref 3.87–5.11)
RDW: 12.1 % (ref 11.5–15.5)
WBC: 3.7 10*3/uL — ABNORMAL LOW (ref 4.0–10.5)
nRBC: 0 % (ref 0.0–0.2)

## 2022-12-04 LAB — CMP (CANCER CENTER ONLY)
ALT: 46 U/L — ABNORMAL HIGH (ref 0–44)
AST: 37 U/L (ref 15–41)
Albumin: 4.1 g/dL (ref 3.5–5.0)
Alkaline Phosphatase: 142 U/L — ABNORMAL HIGH (ref 38–126)
Anion gap: 6 (ref 5–15)
BUN: 25 mg/dL — ABNORMAL HIGH (ref 8–23)
CO2: 23 mmol/L (ref 22–32)
Calcium: 8.6 mg/dL — ABNORMAL LOW (ref 8.9–10.3)
Chloride: 109 mmol/L (ref 98–111)
Creatinine: 1.36 mg/dL — ABNORMAL HIGH (ref 0.44–1.00)
GFR, Estimated: 43 mL/min — ABNORMAL LOW (ref >60)
Glucose, Bld: 215 mg/dL — ABNORMAL HIGH (ref 70–99)
Potassium: 3.8 mmol/L (ref 3.5–5.1)
Sodium: 138 mmol/L (ref 135–145)
Total Bilirubin: 0.3 mg/dL (ref 0.3–1.2)
Total Protein: 6.7 g/dL (ref 6.5–8.1)

## 2022-12-04 LAB — SAMPLE TO BLOOD BANK

## 2022-12-04 MED ORDER — ZOLEDRONIC ACID 4 MG/5ML IV CONC
3.3000 mg | Freq: Once | INTRAVENOUS | Status: AC
  Administered 2022-12-04: 3.3 mg via INTRAVENOUS
  Filled 2022-12-04: qty 4.13

## 2022-12-04 MED ORDER — SODIUM CHLORIDE 0.9 % IV SOLN
INTRAVENOUS | Status: DC
  Filled 2022-12-04 (×2): qty 250

## 2022-12-04 MED ORDER — HEPARIN SOD (PORK) LOCK FLUSH 100 UNIT/ML IV SOLN
500.0000 [IU] | Freq: Once | INTRAVENOUS | Status: AC
  Administered 2022-12-04: 500 [IU] via INTRAVENOUS
  Filled 2022-12-04: qty 5

## 2022-12-04 NOTE — Progress Notes (Signed)
States that the neuropathy in her feet is getting worse. It is affecting balance.

## 2022-12-04 NOTE — Progress Notes (Signed)
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  ITP.  INTERVAL HISTORY: Patient returns to clinic today for repeat laboratory work and continuation of Zometa.  She recently received an injection in her left knee with improvement of her symptoms.  She reports her peripheral neuropathy is more painful, but she otherwise feels well.  She has no other neurologic complaints.  She denies any chest pain, shortness of breath, cough, or hemoptysis.  She denies any nausea, vomiting, constipation, or diarrhea.  She has no urinary complaints.  Patient offers no further specific complaints today.  REVIEW OF SYSTEMS:   Review of Systems  Constitutional: Negative.  Negative for fever, malaise/fatigue and weight loss.  HENT: Negative.  Negative for congestion and sinus pain.   Respiratory: Negative.  Negative for cough, hemoptysis and shortness of breath.   Cardiovascular:  Negative for chest pain and leg swelling.  Gastrointestinal: Negative.  Negative for abdominal pain, diarrhea and nausea.  Genitourinary: Negative.  Negative for dysuria.  Musculoskeletal:  Positive for joint pain. Negative for back pain and neck pain.  Skin: Negative.  Negative for rash.  Neurological:  Positive for sensory change. Negative for dizziness, tremors, focal weakness, weakness and headaches.  Endo/Heme/Allergies: Negative.  Does not bruise/bleed easily.  Psychiatric/Behavioral: Negative.  The patient is not nervous/anxious and does not have insomnia.     As per HPI. Otherwise, a complete review of systems is negative.  PAST MEDICAL HISTORY: Past Medical History:  Diagnosis Date   Anxiety    Diabetes mellitus without complication (HCC) 2016   GERD (gastroesophageal reflux  disease)    Hyperlipidemia    Myeloma (HCC)    Myeloma (HCC)    Personal history of colonic polyps    Squamous cell carcinoma of skin 11/25/2013   Left chest. WD SCC with superficial infiltration.   Tachycardia     PAST SURGICAL HISTORY: Past Surgical History:  Procedure Laterality Date   AUGMENTATION MAMMAPLASTY Bilateral 1983   silicone and replacement in 2001   BREAST ENHANCEMENT SURGERY  1983   CESAREAN SECTION  1987, 1989   COLONOSCOPY  1988, 1998,2003, 2008, 2013   COLONOSCOPY WITH PROPOFOL N/A 08/16/2016   Procedure: COLONOSCOPY WITH PROPOFOL;  Surgeon: Earline Mayotte, MD;  Location: [facility] ENDOSCOPY;  Service: Endoscopy;  Laterality: N/A;   COLONOSCOPY WITH PROPOFOL N/A 10/05/2021   Procedure: COLONOSCOPY WITH PROPOFOL;  Surgeon: Earline Mayotte, MD;  Location: [facility] ENDOSCOPY;  Service: Endoscopy;  Laterality: N/A;  HAS A PORT, NEEDS AMPICILLIN PER OFFICE   DILATION AND CURETTAGE OF UTERUS     ENDOMETRIAL ABLATION  12/1991   ganglion cyst removal      IR BONE MARROW BIOPSY & ASPIRATION  08/08/2022   IR IMAGING GUIDED PORT INSERTION  10/27/2020   IR IMAGING GUIDED PORT INSERTION  04/04/2021   KYPHOPLASTY N/A 10/28/2020   Procedure: T12 and L3 KYPHOPLASTY;  Surgeon: Kennedy Bucker, MD;  Location: [facility] ORS;  Service: Orthopedics;  Laterality: N/A;   TONSILLECTOMY     WRIST SURGERY Right 05/1995    FAMILY HISTORY: Family History  Problem Relation Age of Onset   Colon polyps Sister    Colon cancer Father 30   Diabetes Mother    Breast cancer Neg Hx     ADVANCED DIRECTIVES (  Y/N):  N  HEALTH MAINTENANCE: Social History   Tobacco Use   Smoking status: Former    Current packs/day: 0.00    Average packs/day: 1 pack/day for 4.0 years (4.0 ttl pk-yrs)    Types: Cigarettes    Start date: 72    Quit date: 1982    Years since quitting: 42.7   Smokeless tobacco: Never  Vaping Use   Vaping status: Never Used  Substance Use Topics   Alcohol use: Yes    Alcohol/week:  1.0 - 2.0 standard drink of alcohol    Types: 1 - 2 Glasses of wine per week    Comment: occassionally   Drug use: No     Colonoscopy:  PAP:  Bone density:  Lipid panel:  No Known Allergies  Current Outpatient Medications  Medication Sig Dispense Refill   acetaminophen (TYLENOL) 325 MG tablet Take 650 mg by mouth 2 (two) times a week. Premed for velcade. Taking Tuesdays and Friday     acyclovir (ZOVIRAX) 400 MG tablet TAKE 1 TABLET BY MOUTH TWICE DAILY 60 tablet 5   albuterol (VENTOLIN HFA) 108 (90 Base) MCG/ACT inhaler Inhale 2 puffs into the lungs every 4 (four) hours as needed.     ALPRAZolam (XANAX) 0.5 MG tablet TAKE 1 TABLET BY MOUTH AT BEDTIME AS NEEDED FOR ANXIETY 30 tablet 0   ascorbic acid (VITAMIN C) 500 MG tablet Take 500 mg by mouth daily.     atorvastatin (LIPITOR) 20 MG tablet Take 20 mg by mouth daily.     busPIRone (BUSPAR) 30 MG tablet Take 30 mg by mouth 2 (two) times daily.     Calcium Carb-Cholecalciferol (OYSTER SHELL CALCIUM/VITAMIN D) 500-5 MG-MCG PACK Take by mouth.     CALCIUM-VITAMIN D PO Take by mouth 2 (two) times daily.     cyclobenzaprine (FLEXERIL) 10 MG tablet TAKE 1 TABLET BY MOUTH AT BEDTIME 30 tablet 2   diphenhydrAMINE (BENADRYL) 25 mg capsule Take 25 mg by mouth 2 (two) times a week. Pre med for velcade. Only Tuesdays and Fridays     diphenoxylate-atropine (LOMOTIL) 2.5-0.025 MG tablet Take 1-2 tablets by mouth every 8 (eight) hours as needed for diarrhea or loose stools. 90 tablet 0   eltrombopag (PROMACTA) 50 MG tablet Take 1 tablet (50 mg total) by mouth daily. Take on an empty stomach 1 hour before a meal or 2 hours after 30 tablet 0   ferrous sulfate 325 (65 FE) MG tablet Take 325 mg by mouth daily with breakfast.     gabapentin (NEURONTIN) 300 MG capsule TAKE 1 CAPSULE BY MOUTH TWICE DAILY 60 capsule 2   ivabradine (CORLANOR) 5 MG TABS tablet Take 5 mg by mouth 2 (two) times daily with a meal.     lidocaine-prilocaine (EMLA) cream APPLY 1  APPLICATION TOPICALLY AS NEEDED 30 g 2   liraglutide (VICTOZA) 18 MG/3ML SOPN Inject 1.2 mg into the skin daily.     Magnesium 400 MG TABS Take 1 tablet by mouth daily.     meloxicam (MOBIC) 15 MG tablet Take 15 mg by mouth daily.     Multiple Vitamin (MULTI-VITAMIN) tablet Take 1 tablet by mouth daily.     omeprazole (PRILOSEC) 40 MG capsule Take 40 mg by mouth daily.     ondansetron (ZOFRAN) 8 MG tablet Take 1 tablet (8 mg total) by mouth 2 (two) times daily as needed (Nausea or vomiting). 60 tablet 2   oxyCODONE-acetaminophen (PERCOCET/ROXICET) 5-325 MG tablet TAKE 1-2 TABLETS BY MOUTH  EVERY 4 HOURS AS NEEDED FOR SEVERE PAIN 90 tablet 0   prochlorperazine (COMPAZINE) 10 MG tablet TAKE 1 TABLET BY MOUTH EVERY 6 HOURS AS NEEDED FOR NAUSEA OR VOMITING 60 tablet 2   traMADol (ULTRAM) 50 MG tablet TAKE 1 TABLET BY MOUTH EVERY 6 HOURS AS NEEDED FOR PAIN 60 tablet 0   dexamethasone (DECADRON) 4 MG tablet Take 4 mg by mouth daily. 5 tabs po q28 days (Patient not taking: Reported on 10/10/2022)     methocarbamol (ROBAXIN) 500 MG tablet Take 1 tablet (500 mg total) by mouth every 6 (six) hours as needed for muscle spasms. (Patient not taking: Reported on 10/10/2022) 40 tablet 1   predniSONE (DELTASONE) 20 MG tablet Take 3 tablets (60 mg total) by mouth daily with breakfast. (Patient not taking: Reported on 10/10/2022) 21 tablet 0   No current facility-administered medications for this visit.   Facility-Administered Medications Ordered in Other Visits  Medication Dose Route Frequency Provider Last Rate Last Admin   0.9 %  sodium chloride infusion   Intravenous Continuous Jeralyn Ruths, MD   Stopped at 04/07/21 1142   heparin lock flush 100 unit/mL  500 Units Intravenous Once Jeralyn Ruths, MD       sodium chloride flush (NS) 0.9 % injection 10 mL  10 mL Intravenous PRN Jeralyn Ruths, MD   10 mL at 11/23/20 0932   sodium chloride flush (NS) 0.9 % injection 10 mL  10 mL Intravenous PRN  Jeralyn Ruths, MD   10 mL at 04/18/22 1417   sodium chloride flush (NS) 0.9 % injection 10 mL  10 mL Intravenous Once Jeralyn Ruths, MD        OBJECTIVE: Vitals:   12/04/22 1304  BP: (!) 140/70  Pulse: 83  Resp: 16  Temp: 98.5 F (36.9 C)  SpO2: 100%     Body mass index is 25.42 kg/m.    ECOG FS:0 - Asymptomatic  General: Well-developed, well-nourished, no acute distress. Eyes: Pink conjunctiva, anicteric sclera. HEENT: Normocephalic, moist mucous membranes. Lungs: No audible wheezing or coughing. Heart: Regular rate and rhythm. Abdomen: Soft, nontender, no obvious distention. Musculoskeletal: No edema, cyanosis, or clubbing. Neuro: Alert, answering all questions appropriately. Cranial nerves grossly intact. Skin: No rashes or petechiae noted. Psych: Normal affect.  LAB RESULTS:  Lab Results  Component Value Date   NA 138 12/04/2022   K 3.8 12/04/2022   CL 109 12/04/2022   CO2 23 12/04/2022   GLUCOSE 215 (H) 12/04/2022   BUN 25 (H) 12/04/2022   CREATININE 1.36 (H) 12/04/2022   CALCIUM 8.6 (L) 12/04/2022   PROT 6.7 12/04/2022   ALBUMIN 4.1 12/04/2022   AST 37 12/04/2022   ALT 46 (H) 12/04/2022   ALKPHOS 142 (H) 12/04/2022   BILITOT 0.3 12/04/2022   GFRNONAA 43 (L) 12/04/2022    Lab Results  Component Value Date   WBC 3.7 (L) 12/04/2022   NEUTROABS 2.6 12/04/2022   HGB 11.0 (L) 12/04/2022   HCT 33.7 (L) 12/04/2022   MCV 110.1 (H) 12/04/2022   PLT 76 (L) 12/04/2022     STUDIES: No results found.  ONCOLOGY HISTORY:  Diagnosis confirmed from bone biopsy on August 17, 2020.  Initially her lambda free light chains were elevated at 432.1, but now are within normal limits at 12.1.  Immunoglobulins and SPEP are normal.  Bone marrow biopsy completed on August 26, 2020 revealed 25% plasma cells along with the deletion of 1p which is considered poor  prognosis.  PET scan results from September 06, 2020 reviewed independently with 3 hypermetabolic lesions in right  C5-6 facets, right iliac crest lesion, and L3 vertebral lesion.  After lengthy discussion with the patient, she agreed to pursue chemotherapy with daratumumab, Velcade, Revlimid, and dexamethasone followed by autologous bone marrow transplant.  Patient received weekly daratumumab for 4 cycles along with Velcade on days 1, 4, 8, and 11.  She also received Revlimid on days 1 through 14 of a 21-day cycle.  She completed cycle 4 of treatment on January 14, 2021.  Subsequent bone marrow biopsy on January 24, 2021 revealed complete remission with no evidence of myeloma.  Patient underwent autologous stem cell transplant at [facility] on February 24, 2021.  She was readmitted to the hospital on March 04, 2021 with neutropenic fever, diarrhea, and sepsis-like symptoms.  Infectious disease work-up was negative.   Patient previously received consolidation treatment with the GRIFFIN regimen.  She received consolidation with cycle 5 and 6 using daratumumab on day 1, Velcade on day 1, 4, 8, and 11, Revlimid 10 mg on days 1 through 14 with 7 days off, and weekly dexamethasone on a 21-day cycle.  She was on maintenance daratumumab on day 1 and Revlimid 10 mg on days 1 through 21 on a 28-day cycle for cycles 7 through 32.  Her most recent PET scan on July 31, 2022 reviewed independently and report as above with no obvious evidence of recurrent disease.  A second bone marrow biopsy was completed on August 08, 2022 which also revealed no evidence of disease with normal cytogenetics.    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  ITP.  PLAN:      Lambda light chain myeloma with 1p del:  Given her persistent pancytopenia, maintenance Revlimid was discontinued in approximately July 2024.  Her most recent bone marrow biopsy at [facility] on November 07, 2022 reported complete remission. If patient's pancytopenia resolves, can consider reinitiating treatment with daily 5 mg Revlimid.   Proceed with Zometa today.  Return to clinic in 4 weeks for further evaluation and continuation of treatment. Thrombocytopenia: Chronic and unchanged.  Patient's platelet count is 76 today.  Continue to hold Revlimid as above.  No treatment or transfusion is needed at this time.  Monitor. Anemia: Hemoglobin has improved to 11.0.   Leukopenia: Mild, monitor. Hypocalcemia: Mild.  Proceed with Zometa as above. Renal insufficiency: Creatinine is slightly improved to 1.36 today. Vaccinations: Continue revaccination schedule as per [facility]. Pulmonary nodule: Patient noted to have a 9 mm hypermetabolic pulmonary nodule in her left lower lobe lung.  Possibly infectious, continue to monitor closely. Left knee pain: Chronic and unchanged.  Continue conservative management and an ductions as needed per orthopedics Neuropathy: Patient was given a referral to neurology today.  We discussed possibly increasing her gabapentin dose, but patient does not wish to do this at this time.  Patient expressed understanding and was in agreement with this plan. She also understands that She can call clinic at any time with any questions, concerns, or complaints.      Cancer Staging  Lambda light chain myeloma (HCC) Staging form: Plasma Cell Myeloma and Plasma Cell Disorders, AJCC 8th Edition - Clinical stage from 09/16/2020: RISS Stage I (Beta-2-microglobulin (mg/L): 2.5, Albumin (g/dL): 4.9, ISS: Stage I, High-risk cytogenetics: Absent, LDH: Normal) - Signed by Jeralyn Ruths, MD on 09/16/2020 Beta 2 microglobulin range (mg/L): Less than 3.5 Albumin range (g/dL): Greater  than or equal to 3.5 Cytogenetics: 1p deletion  Jeralyn Ruths, MD   12/04/2022 1:41 PM

## 2022-12-05 LAB — KAPPA/LAMBDA LIGHT CHAINS
Kappa free light chain: 6 mg/L (ref 3.3–19.4)
Kappa, lambda light chain ratio: 1.58 (ref 0.26–1.65)
Lambda free light chains: 3.8 mg/L — ABNORMAL LOW (ref 5.7–26.3)

## 2023-01-01 ENCOUNTER — Encounter: Payer: Self-pay | Admitting: Oncology

## 2023-01-01 ENCOUNTER — Inpatient Hospital Stay: Payer: Medicare PPO

## 2023-01-01 ENCOUNTER — Other Ambulatory Visit: Payer: Self-pay | Admitting: Pharmacist

## 2023-01-01 ENCOUNTER — Inpatient Hospital Stay (HOSPITAL_BASED_OUTPATIENT_CLINIC_OR_DEPARTMENT_OTHER): Payer: Medicare PPO | Admitting: Oncology

## 2023-01-01 ENCOUNTER — Inpatient Hospital Stay: Payer: Medicare PPO | Attending: Oncology

## 2023-01-01 VITALS — BP 127/63 | HR 86 | Temp 98.4°F | Resp 16 | Ht 62.0 in | Wt 141.0 lb

## 2023-01-01 DIAGNOSIS — Z7961 Long term (current) use of immunomodulator: Secondary | ICD-10-CM | POA: Insufficient documentation

## 2023-01-01 DIAGNOSIS — C9001 Multiple myeloma in remission: Secondary | ICD-10-CM | POA: Diagnosis present

## 2023-01-01 DIAGNOSIS — Z7969 Long term (current) use of other immunomodulators and immunosuppressants: Secondary | ICD-10-CM | POA: Diagnosis not present

## 2023-01-01 DIAGNOSIS — Z79899 Other long term (current) drug therapy: Secondary | ICD-10-CM | POA: Insufficient documentation

## 2023-01-01 DIAGNOSIS — D693 Immune thrombocytopenic purpura: Secondary | ICD-10-CM | POA: Diagnosis present

## 2023-01-01 DIAGNOSIS — C9000 Multiple myeloma not having achieved remission: Secondary | ICD-10-CM

## 2023-01-01 DIAGNOSIS — Z79624 Long term (current) use of inhibitors of nucleotide synthesis: Secondary | ICD-10-CM | POA: Diagnosis not present

## 2023-01-01 LAB — CMP (CANCER CENTER ONLY)
ALT: 32 U/L (ref 0–44)
AST: 27 U/L (ref 15–41)
Albumin: 4.2 g/dL (ref 3.5–5.0)
Alkaline Phosphatase: 114 U/L (ref 38–126)
Anion gap: 8 (ref 5–15)
BUN: 25 mg/dL — ABNORMAL HIGH (ref 8–23)
CO2: 24 mmol/L (ref 22–32)
Calcium: 9 mg/dL (ref 8.9–10.3)
Chloride: 104 mmol/L (ref 98–111)
Creatinine: 1.39 mg/dL — ABNORMAL HIGH (ref 0.44–1.00)
GFR, Estimated: 42 mL/min — ABNORMAL LOW (ref >60)
Glucose, Bld: 279 mg/dL — ABNORMAL HIGH (ref 70–99)
Potassium: 3.7 mmol/L (ref 3.5–5.1)
Sodium: 136 mmol/L (ref 135–145)
Total Bilirubin: 0.5 mg/dL (ref <1.2)
Total Protein: 6.6 g/dL (ref 6.5–8.1)

## 2023-01-01 LAB — CBC WITH DIFFERENTIAL/PLATELET
Abs Immature Granulocytes: 0.01 10*3/uL (ref 0.00–0.07)
Basophils Absolute: 0 10*3/uL (ref 0.0–0.1)
Basophils Relative: 1 %
Eosinophils Absolute: 0.1 10*3/uL (ref 0.0–0.5)
Eosinophils Relative: 2 %
HCT: 35.2 % — ABNORMAL LOW (ref 36.0–46.0)
Hemoglobin: 11.5 g/dL — ABNORMAL LOW (ref 12.0–15.0)
Immature Granulocytes: 0 %
Lymphocytes Relative: 16 %
Lymphs Abs: 0.7 10*3/uL (ref 0.7–4.0)
MCH: 34.7 pg — ABNORMAL HIGH (ref 26.0–34.0)
MCHC: 32.7 g/dL (ref 30.0–36.0)
MCV: 106.3 fL — ABNORMAL HIGH (ref 80.0–100.0)
Monocytes Absolute: 0.4 10*3/uL (ref 0.1–1.0)
Monocytes Relative: 9 %
Neutro Abs: 3.1 10*3/uL (ref 1.7–7.7)
Neutrophils Relative %: 72 %
Platelets: 85 10*3/uL — ABNORMAL LOW (ref 150–400)
RBC: 3.31 MIL/uL — ABNORMAL LOW (ref 3.87–5.11)
RDW: 11.9 % (ref 11.5–15.5)
WBC: 4.3 10*3/uL (ref 4.0–10.5)
nRBC: 0 % (ref 0.0–0.2)

## 2023-01-01 LAB — SAMPLE TO BLOOD BANK

## 2023-01-01 MED ORDER — SODIUM CHLORIDE 0.9 % IV SOLN
Freq: Once | INTRAVENOUS | Status: AC
  Filled 2023-01-01: qty 250

## 2023-01-01 MED ORDER — ZOLEDRONIC ACID 4 MG/5ML IV CONC
3.3000 mg | Freq: Once | INTRAVENOUS | Status: AC
Start: 2023-01-01 — End: 2023-01-01
  Administered 2023-01-01: 3.3 mg via INTRAVENOUS
  Filled 2023-01-01: qty 4.13

## 2023-01-01 MED ORDER — LENALIDOMIDE 5 MG PO CAPS: 5.0000 mg | ORAL_CAPSULE | Freq: Every day | ORAL | 0 refills | Status: DC

## 2023-01-01 NOTE — Progress Notes (Signed)
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  ITP.  INTERVAL HISTORY: Patient returns to clinic today for repeat laboratory work, further evaluation, and continuation of Zometa.  She recently with the bone marrow transplant team who has recommended reinitiating Revlimid at 5 mg daily.  She continues to have intermittent knee pain.  Her peripheral neuropathy is unchanged. She has no other neurologic complaints.  She denies any recent fevers or illnesses.  She denies any chest pain, shortness of breath, cough, or hemoptysis.  She denies any nausea, vomiting, constipation, or diarrhea.  She has no urinary complaints. Patient offers no further specific complaints today.  REVIEW OF SYSTEMS:   Review of Systems  Constitutional: Negative.  Negative for fever, malaise/fatigue and weight loss.  HENT: Negative.  Negative for congestion and sinus pain.   Respiratory: Negative.  Negative for cough, hemoptysis and shortness of breath.   Cardiovascular:  Negative for chest pain and leg swelling.  Gastrointestinal: Negative.  Negative for abdominal pain, diarrhea and nausea.  Genitourinary: Negative.  Negative for dysuria.  Musculoskeletal:  Positive for joint pain. Negative for back pain and neck pain.  Skin: Negative.  Negative for rash.  Neurological:  Positive for sensory change. Negative for dizziness, tremors, focal weakness, weakness and headaches.  Endo/Heme/Allergies: Negative.  Does not bruise/bleed easily.  Psychiatric/Behavioral: Negative.  The patient is not nervous/anxious and does not have insomnia.     As per HPI. Otherwise, a complete review of systems is negative.  PAST MEDICAL HISTORY: Past Medical History:  Diagnosis Date   Anxiety     Diabetes mellitus without complication (HCC) 2016   GERD (gastroesophageal reflux disease)    Hyperlipidemia    Myeloma (HCC)    Myeloma (HCC)    Personal history of colonic polyps    Squamous cell carcinoma of skin 11/25/2013   Left chest. WD SCC with superficial infiltration.   Tachycardia     PAST SURGICAL HISTORY: Past Surgical History:  Procedure Laterality Date   AUGMENTATION MAMMAPLASTY Bilateral 1983   silicone and replacement in 2001   BREAST ENHANCEMENT SURGERY  1983   CESAREAN SECTION  1987, 1989   COLONOSCOPY  1988, 1998,2003, 2008, 2013   COLONOSCOPY WITH PROPOFOL N/A 08/16/2016   Procedure: COLONOSCOPY WITH PROPOFOL;  Surgeon: Earline Mayotte, MD;  Location: [facility] ENDOS[facility];  Service: Endoscopy;  Laterality: N/A;   COLONOSCOPY WITH PROPOFOL N/A 10/05/2021   Procedure: COLONOSCOPY WITH PROPOFOL;  Surgeon: Earline Mayotte, MD;  Location: [facility] ENDOSCOPY;  Service: Endoscopy;  Laterality: N/A;  HAS A PORT, NEEDS AMPICILLIN PER OFFICE   DILATION AND CURETTAGE OF UTERUS     ENDOMETRIAL ABLATION  12/1991   ganglion cyst removal      IR BONE MARROW BIOPSY & ASPIRATION  08/08/2022   IR IMAGING GUIDED PORT INSERTION  10/27/2020   IR IMAGING GUIDED PORT INSERTION  04/04/2021   KYPHOPLASTY N/A 10/28/2020   Procedure: T12 and L3 KYPHOPLASTY;  Surgeon: Kennedy Bucker, MD;  Location: [facility] ORS;  Service: Orthopedics;  Laterality: N/A;   TONSILLECTOMY     WRIST SURGERY Right 05/1995    FAMILY HISTORY: Family History  Problem Relation Age of Onset   Colon polyps Sister    Colon cancer Father 62   Diabetes Mother  Breast cancer Neg Hx     ADVANCED DIRECTIVES (Y/N):  N  HEALTH MAINTENANCE: Social History   Tobacco Use   Smoking status: Former    Current packs/day: 0.00    Average packs/day: 1 pack/day for 4.0 years (4.0 ttl pk-yrs)    Types: Cigarettes    Start date: 33    Quit date: 1982    Years since quitting: 42.8   Smokeless tobacco: Never  Vaping Use    Vaping status: Never Used  Substance Use Topics   Alcohol use: Yes    Alcohol/week: 1.0 - 2.0 standard drink of alcohol    Types: 1 - 2 Glasses of wine per week    Comment: occassionally   Drug use: No     Colonoscopy:  PAP:  Bone density:  Lipid panel:  No Known Allergies  Current Outpatient Medications  Medication Sig Dispense Refill   acetaminophen (TYLENOL) 325 MG tablet Take 650 mg by mouth 2 (two) times a week. Premed for velcade. Taking Tuesdays and Friday     acyclovir (ZOVIRAX) 400 MG tablet TAKE 1 TABLET BY MOUTH TWICE DAILY 60 tablet 5   albuterol (VENTOLIN HFA) 108 (90 Base) MCG/ACT inhaler Inhale 2 puffs into the lungs every 4 (four) hours as needed.     ALPRAZolam (XANAX) 0.5 MG tablet TAKE 1 TABLET BY MOUTH AT BEDTIME AS NEEDED FOR ANXIETY 30 tablet 0   ascorbic acid (VITAMIN C) 500 MG tablet Take 500 mg by mouth daily.     atorvastatin (LIPITOR) 20 MG tablet Take 20 mg by mouth daily.     busPIRone (BUSPAR) 30 MG tablet Take 30 mg by mouth 2 (two) times daily.     Calcium Carb-Cholecalciferol (OYSTER SHELL CALCIUM/VITAMIN D) 500-5 MG-MCG PACK Take by mouth.     CALCIUM-VITAMIN D PO Take by mouth 2 (two) times daily.     cyclobenzaprine (FLEXERIL) 10 MG tablet TAKE 1 TABLET BY MOUTH AT BEDTIME 30 tablet 2   diphenhydrAMINE (BENADRYL) 25 mg capsule Take 25 mg by mouth 2 (two) times a week. Pre med for velcade. Only Tuesdays and Fridays     diphenoxylate-atropine (LOMOTIL) 2.5-0.025 MG tablet Take 1-2 tablets by mouth every 8 (eight) hours as needed for diarrhea or loose stools. 90 tablet 0   ferrous sulfate 325 (65 FE) MG tablet Take 325 mg by mouth daily with breakfast.     gabapentin (NEURONTIN) 300 MG capsule TAKE 1 CAPSULE BY MOUTH TWICE DAILY 60 capsule 2   ivabradine (CORLANOR) 5 MG TABS tablet Take 5 mg by mouth 2 (two) times daily with a meal.     lidocaine-prilocaine (EMLA) cream APPLY 1 APPLICATION TOPICALLY AS NEEDED 30 g 2   liraglutide (VICTOZA) 18 MG/3ML  SOPN Inject 1.2 mg into the skin daily.     Magnesium 400 MG TABS Take 1 tablet by mouth daily.     meloxicam (MOBIC) 15 MG tablet Take 15 mg by mouth daily.     Multiple Vitamin (MULTI-VITAMIN) tablet Take 1 tablet by mouth daily.     omeprazole (PRILOSEC) 40 MG capsule Take 40 mg by mouth daily.     ondansetron (ZOFRAN) 8 MG tablet Take 1 tablet (8 mg total) by mouth 2 (two) times daily as needed (Nausea or vomiting). 60 tablet 2   oxyCODONE-acetaminophen (PERCOCET/ROXICET) 5-325 MG tablet TAKE 1-2 TABLETS BY MOUTH EVERY 4 HOURS AS NEEDED FOR SEVERE PAIN 90 tablet 0   prochlorperazine (COMPAZINE) 10 MG tablet TAKE 1 TABLET BY MOUTH  EVERY 6 HOURS AS NEEDED FOR NAUSEA OR VOMITING 60 tablet 2   traMADol (ULTRAM) 50 MG tablet TAKE 1 TABLET BY MOUTH EVERY 6 HOURS AS NEEDED FOR PAIN 60 tablet 0   dexamethasone (DECADRON) 4 MG tablet Take 4 mg by mouth daily. 5 tabs po q28 days (Patient not taking: Reported on 10/10/2022)     lenalidomide (REVLIMID) 5 MG capsule Take 1 capsule (5 mg total) by mouth daily. Take for 21 days, then hold for 7 days. Repeat every 28 days. 21 capsule 0   methocarbamol (ROBAXIN) 500 MG tablet Take 1 tablet (500 mg total) by mouth every 6 (six) hours as needed for muscle spasms. (Patient not taking: Reported on 10/10/2022) 40 tablet 1   predniSONE (DELTASONE) 20 MG tablet Take 3 tablets (60 mg total) by mouth daily with breakfast. (Patient not taking: Reported on 10/10/2022) 21 tablet 0   No current facility-administered medications for this visit.   Facility-Administered Medications Ordered in Other Visits  Medication Dose Route Frequency Provider Last Rate Last Admin   0.9 %  sodium chloride infusion   Intravenous Continuous Jeralyn Ruths, MD   Stopped at 04/07/21 1142   heparin lock flush 100 unit/mL  500 Units Intravenous Once Jeralyn Ruths, MD       sodium chloride flush (NS) 0.9 % injection 10 mL  10 mL Intravenous PRN Jeralyn Ruths, MD   10 mL at 11/23/20  0932   sodium chloride flush (NS) 0.9 % injection 10 mL  10 mL Intravenous PRN Jeralyn Ruths, MD   10 mL at 04/18/22 1417   sodium chloride flush (NS) 0.9 % injection 10 mL  10 mL Intravenous Once Jeralyn Ruths, MD        OBJECTIVE: Vitals:   01/01/23 1000  BP: 127/63  Pulse: 86  Resp: 16  Temp: 98.4 F (36.9 C)  SpO2: 98%     Body mass index is 25.79 kg/m.    ECOG FS:0 - Asymptomatic  General: Well-developed, well-nourished, no acute distress. Eyes: Pink conjunctiva, anicteric sclera. HEENT: Normocephalic, moist mucous membranes. Lungs: No audible wheezing or coughing. Heart: Regular rate and rhythm. Abdomen: Soft, nontender, no obvious distention. Musculoskeletal: No edema, cyanosis, or clubbing. Neuro: Alert, answering all questions appropriately. Cranial nerves grossly intact. Skin: No rashes or petechiae noted. Psych: Normal affect.   LAB RESULTS:  Lab Results  Component Value Date   NA 136 01/01/2023   K 3.7 01/01/2023   CL 104 01/01/2023   CO2 24 01/01/2023   GLUCOSE 279 (H) 01/01/2023   BUN 25 (H) 01/01/2023   CREATININE 1.39 (H) 01/01/2023   CALCIUM 9.0 01/01/2023   PROT 6.6 01/01/2023   ALBUMIN 4.2 01/01/2023   AST 27 01/01/2023   ALT 32 01/01/2023   ALKPHOS 114 01/01/2023   BILITOT 0.5 01/01/2023   GFRNONAA 42 (L) 01/01/2023    Lab Results  Component Value Date   WBC 4.3 01/01/2023   NEUTROABS 3.1 01/01/2023   HGB 11.5 (L) 01/01/2023   HCT 35.2 (L) 01/01/2023   MCV 106.3 (H) 01/01/2023   PLT 85 (L) 01/01/2023     STUDIES: No results found.  ONCOLOGY HISTORY:  Diagnosis confirmed from bone biopsy on August 17, 2020.  Initially her lambda free light chains were elevated at 432.1, but now are within normal limits at 12.1.  Immunoglobulins and SPEP are normal.  Bone marrow biopsy completed on August 26, 2020 revealed 25% plasma cells along with the deletion of  1p which is considered poor prognosis.  PET scan results from September 06, 2020  reviewed independently with 3 hypermetabolic lesions in right C5-6 facets, right iliac crest lesion, and L3 vertebral lesion.  After lengthy discussion with the patient, she agreed to pursue chemotherapy with daratumumab, Velcade, Revlimid, and dexamethasone followed by autologous bone marrow transplant.  Patient received weekly daratumumab for 4 cycles along with Velcade on days 1, 4, 8, and 11.  She also received Revlimid on days 1 through 14 of a 21-day cycle.  She completed cycle 4 of treatment on January 14, 2021.  Subsequent bone marrow biopsy on January 24, 2021 revealed complete remission with no evidence of myeloma.  Patient underwent autologous stem cell transplant [facility] on February 24, 2021.  She was readmitted to the hospital on March 04, 2021 with neutropenic fever, diarrhea, and sepsis-like symptoms.  Infectious disease work-up was negative.   Patient previously received consolidation treatment with the GRIFFIN regimen.  She received consolidation with cycle 5 and 6 using daratumumab on day 1, Velcade on day 1, 4, 8, and 11, Revlimid 10 mg on days 1 through 14 with 7 days off, and weekly dexamethasone on a 21-day cycle.  She was on maintenance daratumumab on day 1 and Revlimid 10 mg on days 1 through 21 on a 28-day cycle for cycles 7 through 32.  Her most recent PET scan on July 31, 2022 reviewed independently and report as above with no obvious evidence of recurrent disease.  A second bone marrow biopsy was completed on August 08, 2022 which also revealed no evidence of disease with normal cytogenetics.    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  ITP.  PLAN:      Lambda light chain myeloma with 1p del:  Given her persistent pancytopenia, maintenance Revlimid was discontinued in approximately July 2024.  Her most recent bone marrow biopsy at [facility] on November 07, 2022 reported complete remission.  Given patient's improvement of her  pancytopenia, will reinitiate 5 mg Revlimid daily.  Proceed with Zometa today.  Return to clinic in 4 weeks for further evaluation and continuation of treatment.  Will consider transitioning Zometa to every 3 months at this appointment.   Thrombocytopenia: Platelet count improved to 85.  Reinitiate low-dose Revlimid as above.  Anemia: Hemoglobin continues to trend up and is now 11.5. Leukopenia: Resolved. Hypocalcemia: Resolved. Renal insufficiency: Chronic and unchanged.  Patient's creatinine is 1.39 today. Vaccinations: Continue revaccination schedule as per [facility]. Pulmonary nodule: Patient noted to have a 9 mm hypermetabolic pulmonary nodule in her left lower lobe lung.  Possibly infectious, continue to monitor closely. Left knee pain: Chronic and unchanged.  Continue conservative management and an ductions as needed per orthopedics Neuropathy: Patient was previously given a referral to neurology.  She reports her appointment is in December 2024.  We discussed possibly increasing her gabapentin dose, but patient does not wish to do this at this time.  Patient expressed understanding and was in agreement with this plan. She also understands that She can call clinic at any time with any questions, concerns, or complaints.      Cancer Staging  Lambda light chain myeloma (HCC) Staging form: Plasma Cell Myeloma and Plasma Cell Disorders, AJCC 8th Edition - Clinical stage from 09/16/2020: RISS Stage I (Beta-2-microglobulin (mg/L): 2.5, Albumin (g/dL): 4.9, ISS: Stage I, High-risk cytogenetics: Absent, LDH: Normal) - Signed by Jeralyn Ruths, MD on 09/16/2020 Beta 2 microglobulin range (  mg/L): Less than 3.5 Albumin range (g/dL): Greater than or equal to 3.5 Cytogenetics: 1p deletion  Jeralyn Ruths, MD   01/01/2023 4:22 PM

## 2023-01-01 NOTE — Progress Notes (Signed)
MD resuming patient's Revlimid at 5mg s, Rx sent to [facility] for patient.

## 2023-01-02 ENCOUNTER — Other Ambulatory Visit: Payer: Self-pay | Admitting: Oncology

## 2023-01-02 LAB — KAPPA/LAMBDA LIGHT CHAINS
Kappa free light chain: 4.5 mg/L (ref 3.3–19.4)
Kappa, lambda light chain ratio: 1.67 — ABNORMAL HIGH (ref 0.26–1.65)
Lambda free light chains: 2.7 mg/L — ABNORMAL LOW (ref 5.7–26.3)

## 2023-01-04 ENCOUNTER — Encounter: Payer: Self-pay | Admitting: Oncology

## 2023-01-11 ENCOUNTER — Encounter: Payer: Self-pay | Admitting: Oncology

## 2023-01-11 ENCOUNTER — Other Ambulatory Visit: Payer: Self-pay | Admitting: Pharmacist

## 2023-01-11 DIAGNOSIS — C9000 Multiple myeloma not having achieved remission: Secondary | ICD-10-CM

## 2023-01-11 DIAGNOSIS — R21 Rash and other nonspecific skin eruption: Secondary | ICD-10-CM

## 2023-01-11 MED ORDER — METHYLPREDNISOLONE 4 MG PO TBPK: ORAL_TABLET | ORAL | 0 refills | Status: DC

## 2023-01-11 NOTE — Telephone Encounter (Signed)
Medrol Dosepak sent in for patient, [place] She knows to recontact the office if the rash does not improve/worsens. She will continue taking her Benadryl. Reminded patient to use sun protection to avoid worsening the rash.

## 2023-01-16 ENCOUNTER — Other Ambulatory Visit: Payer: Self-pay | Admitting: Oncology

## 2023-01-16 DIAGNOSIS — R200 Anesthesia of skin: Secondary | ICD-10-CM

## 2023-01-23 ENCOUNTER — Other Ambulatory Visit: Payer: Self-pay | Admitting: Oncology

## 2023-01-23 DIAGNOSIS — C9000 Multiple myeloma not having achieved remission: Secondary | ICD-10-CM

## 2023-01-29 ENCOUNTER — Ambulatory Visit: Payer: Medicare PPO | Admitting: Oncology

## 2023-01-29 ENCOUNTER — Other Ambulatory Visit: Payer: Medicare PPO

## 2023-01-29 ENCOUNTER — Ambulatory Visit: Payer: Medicare PPO

## 2023-01-29 ENCOUNTER — Ambulatory Visit: Payer: Medicare PPO | Admitting: Pharmacist

## 2023-01-30 ENCOUNTER — Encounter: Payer: Self-pay | Admitting: Oncology

## 2023-01-30 ENCOUNTER — Inpatient Hospital Stay: Payer: Medicare PPO | Admitting: Pharmacist

## 2023-01-30 ENCOUNTER — Inpatient Hospital Stay (HOSPITAL_BASED_OUTPATIENT_CLINIC_OR_DEPARTMENT_OTHER): Payer: Medicare PPO | Admitting: Oncology

## 2023-01-30 ENCOUNTER — Inpatient Hospital Stay: Payer: Medicare PPO

## 2023-01-30 ENCOUNTER — Inpatient Hospital Stay: Payer: Medicare PPO | Attending: Oncology

## 2023-01-30 VITALS — BP 136/59 | HR 78 | Temp 97.9°F | Resp 20 | Wt 141.0 lb

## 2023-01-30 DIAGNOSIS — C9001 Multiple myeloma in remission: Secondary | ICD-10-CM | POA: Insufficient documentation

## 2023-01-30 DIAGNOSIS — Z87891 Personal history of nicotine dependence: Secondary | ICD-10-CM | POA: Diagnosis not present

## 2023-01-30 DIAGNOSIS — D693 Immune thrombocytopenic purpura: Secondary | ICD-10-CM | POA: Diagnosis present

## 2023-01-30 DIAGNOSIS — Z79624 Long term (current) use of inhibitors of nucleotide synthesis: Secondary | ICD-10-CM | POA: Diagnosis not present

## 2023-01-30 DIAGNOSIS — Z79899 Other long term (current) drug therapy: Secondary | ICD-10-CM | POA: Diagnosis not present

## 2023-01-30 DIAGNOSIS — Z9484 Stem cells transplant status: Secondary | ICD-10-CM | POA: Insufficient documentation

## 2023-01-30 DIAGNOSIS — C9000 Multiple myeloma not having achieved remission: Secondary | ICD-10-CM

## 2023-01-30 DIAGNOSIS — Z7961 Long term (current) use of immunomodulator: Secondary | ICD-10-CM | POA: Insufficient documentation

## 2023-01-30 DIAGNOSIS — Z7969 Long term (current) use of other immunomodulators and immunosuppressants: Secondary | ICD-10-CM | POA: Diagnosis not present

## 2023-01-30 LAB — CMP (CANCER CENTER ONLY)
ALT: 29 U/L (ref 0–44)
AST: 26 U/L (ref 15–41)
Albumin: 3.9 g/dL (ref 3.5–5.0)
Alkaline Phosphatase: 88 U/L (ref 38–126)
Anion gap: 11 (ref 5–15)
BUN: 21 mg/dL (ref 8–23)
CO2: 23 mmol/L (ref 22–32)
Calcium: 8.6 mg/dL — ABNORMAL LOW (ref 8.9–10.3)
Chloride: 103 mmol/L (ref 98–111)
Creatinine: 1.42 mg/dL — ABNORMAL HIGH (ref 0.44–1.00)
GFR, Estimated: 41 mL/min — ABNORMAL LOW (ref >60)
Glucose, Bld: 165 mg/dL — ABNORMAL HIGH (ref 70–99)
Potassium: 3.4 mmol/L — ABNORMAL LOW (ref 3.5–5.1)
Sodium: 137 mmol/L (ref 135–145)
Total Bilirubin: 0.5 mg/dL (ref <1.2)
Total Protein: 6.3 g/dL — ABNORMAL LOW (ref 6.5–8.1)

## 2023-01-30 LAB — CBC WITH DIFFERENTIAL/PLATELET
Abs Immature Granulocytes: 0 10*3/uL (ref 0.00–0.07)
Basophils Absolute: 0 10*3/uL (ref 0.0–0.1)
Basophils Relative: 0 %
Eosinophils Absolute: 0.1 10*3/uL (ref 0.0–0.5)
Eosinophils Relative: 3 %
HCT: 35.6 % — ABNORMAL LOW (ref 36.0–46.0)
Hemoglobin: 11.6 g/dL — ABNORMAL LOW (ref 12.0–15.0)
Immature Granulocytes: 0 %
Lymphocytes Relative: 31 %
Lymphs Abs: 0.8 10*3/uL (ref 0.7–4.0)
MCH: 34.2 pg — ABNORMAL HIGH (ref 26.0–34.0)
MCHC: 32.6 g/dL (ref 30.0–36.0)
MCV: 105 fL — ABNORMAL HIGH (ref 80.0–100.0)
Monocytes Absolute: 0.4 10*3/uL (ref 0.1–1.0)
Monocytes Relative: 14 %
Neutro Abs: 1.4 10*3/uL — ABNORMAL LOW (ref 1.7–7.7)
Neutrophils Relative %: 52 %
Platelets: 108 10*3/uL — ABNORMAL LOW (ref 150–400)
RBC: 3.39 MIL/uL — ABNORMAL LOW (ref 3.87–5.11)
RDW: 12.5 % (ref 11.5–15.5)
WBC: 2.8 10*3/uL — ABNORMAL LOW (ref 4.0–10.5)
nRBC: 0 % (ref 0.0–0.2)

## 2023-01-30 LAB — SAMPLE TO BLOOD BANK

## 2023-01-30 MED ORDER — SODIUM CHLORIDE 0.9 % IV SOLN
INTRAVENOUS | Status: DC | PRN
  Filled 2023-01-30: qty 250

## 2023-01-30 MED ORDER — ZOLEDRONIC ACID 4 MG/5ML IV CONC
3.0000 mg | Freq: Once | INTRAVENOUS | Status: AC
  Administered 2023-01-30: 3 mg via INTRAVENOUS
  Filled 2023-01-30: qty 3.75

## 2023-01-30 NOTE — Progress Notes (Signed)
Clinical Pharmacist Practitioner [facility]  [phone] [phone]  Patient Care Team: Jaclyn Shaggy, MD as PCP - General (Internal Medicine)   Name of the patient: Hannah Mcdonald  [id number]  [birth year]   Date of visit: 01/30/23  HPI: Patient is a 67 y.o. female with lambda light chain myeloma s/p transplant. Maintenance lenalidomide was held due to pancytopenia, but with count improvement patient restarted lenalidomide in Nov 2024.   Reason for Consult: Oral chemotherapy follow-up for lenalidomide therapy.   PAST MEDICAL HISTORY: Past Medical History:  Diagnosis Date   Anxiety    Diabetes mellitus without complication (HCC) 2016   GERD (gastroesophageal reflux disease)    Hyperlipidemia    Myeloma (HCC)    Myeloma (HCC)    Personal history of colonic polyps    Squamous cell carcinoma of skin 11/25/2013   Left chest. WD SCC with superficial infiltration.   Tachycardia     HEMATOLOGY/ONCOLOGY HISTORY:  Oncology History  Lambda light chain myeloma (HCC)  08/24/2020 Initial Diagnosis   Lambda light chain myeloma (HCC)   09/16/2020 Cancer Staging   Staging form: Plasma Cell Myeloma and Plasma Cell Disorders, AJCC 8th Edition - Clinical stage from 09/16/2020: RISS Stage I (Beta-2-microglobulin (mg/L): 2.5, Albumin (g/dL): 4.9, ISS: Stage I, High-risk cytogenetics: Absent, LDH: Normal) - Signed by Jeralyn Ruths, MD on 09/16/2020 Beta 2 microglobulin range (mg/L): Less than 3.5 Albumin range (g/dL): Greater than or equal to 3.5 Cytogenetics: 1p deletion   11/02/2020 - 07/22/2021 Chemotherapy   Patient is on Treatment Plan : POST TRANSPLANT DaraVRd (Daratumumab SQ) q21d x 2 Cycles (Consolidation)- cycle 5&6     08/02/2021 - 09/27/2021 Chemotherapy   Patient is on Treatment Plan : MYELOMA Daratumumab SQ q28d- Maintainance treatment w/ revlimid     08/02/2021 - 08/15/2022 Chemotherapy   Patient is on Treatment Plan : MYELOMA  POST-TRANSPLANT MAINTENANE Daratumumab SQ + Lenalidomide q28d (Maintenance)       ALLERGIES:  has No Known Allergies.  MEDICATIONS:  Current Outpatient Medications  Medication Sig Dispense Refill   acetaminophen (TYLENOL) 325 MG tablet Take 650 mg by mouth 2 (two) times a week. Premed for velcade. Taking Tuesdays and Friday     acyclovir (ZOVIRAX) 400 MG tablet TAKE 1 TABLET BY MOUTH TWICE DAILY 60 tablet 5   albuterol (VENTOLIN HFA) 108 (90 Base) MCG/ACT inhaler Inhale 2 puffs into the lungs every 4 (four) hours as needed.     ALPRAZolam (XANAX) 0.5 MG tablet TAKE 1 TABLET BY MOUTH AT BEDTIME AS NEEDED FOR ANXIETY 30 tablet 0   ascorbic acid (VITAMIN C) 500 MG tablet Take 500 mg by mouth daily.     atorvastatin (LIPITOR) 20 MG tablet Take 20 mg by mouth daily.     busPIRone (BUSPAR) 30 MG tablet Take 30 mg by mouth 2 (two) times daily.     Calcium Carb-Cholecalciferol (OYSTER SHELL CALCIUM/VITAMIN D) 500-5 MG-MCG PACK Take by mouth.     CALCIUM-VITAMIN D PO Take by mouth 2 (two) times daily.     cyclobenzaprine (FLEXERIL) 10 MG tablet TAKE 1 TABLET BY MOUTH AT BEDTIME 30 tablet 2   dexamethasone (DECADRON) 4 MG tablet Take 4 mg by mouth daily. 5 tabs po q28 days (Patient not taking: Reported on 10/10/2022)     diphenhydrAMINE (BENADRYL) 25 mg capsule Take 25 mg by mouth 2 (two) times a week. Pre med for velcade. Only Tuesdays and Fridays     diphenoxylate-atropine (LOMOTIL) 2.5-0.025 MG tablet  Take 1-2 tablets by mouth every 8 (eight) hours as needed for diarrhea or loose stools. 90 tablet 0   ferrous sulfate 325 (65 FE) MG tablet Take 325 mg by mouth daily with breakfast.     gabapentin (NEURONTIN) 300 MG capsule TAKE 1 CAPSULE BY MOUTH TWICE DAILY 60 capsule 2   ivabradine (CORLANOR) 5 MG TABS tablet Take 5 mg by mouth 2 (two) times daily with a meal.     lenalidomide (REVLIMID) 5 MG capsule TAKE 1 CAPSULE BY MOUTH 1 TIME A DAY FOR 21 DAYS ON THEN 7 DAYS OFF 21 capsule 0    lidocaine-prilocaine (EMLA) cream APPLY 1 APPLICATION TOPICALLY AS NEEDED 30 g 2   liraglutide (VICTOZA) 18 MG/3ML SOPN Inject 1.2 mg into the skin daily.     Magnesium 400 MG TABS Take 1 tablet by mouth daily.     meloxicam (MOBIC) 15 MG tablet Take 15 mg by mouth daily.     methocarbamol (ROBAXIN) 500 MG tablet Take 1 tablet (500 mg total) by mouth every 6 (six) hours as needed for muscle spasms. (Patient not taking: Reported on 10/10/2022) 40 tablet 1   methylPREDNISolone (MEDROL DOSEPAK) 4 MG TBPK tablet Take as directed by dosepak. 21 tablet 0   Multiple Vitamin (MULTI-VITAMIN) tablet Take 1 tablet by mouth daily.     omeprazole (PRILOSEC) 40 MG capsule Take 40 mg by mouth daily.     ondansetron (ZOFRAN) 8 MG tablet Take 1 tablet (8 mg total) by mouth 2 (two) times daily as needed (Nausea or vomiting). 60 tablet 2   oxyCODONE-acetaminophen (PERCOCET/ROXICET) 5-325 MG tablet TAKE 1-2 TABLETS BY MOUTH EVERY 4 HOURS AS NEEDED FOR SEVERE PAIN 90 tablet 0   prochlorperazine (COMPAZINE) 10 MG tablet TAKE 1 TABLET BY MOUTH EVERY 6 HOURS AS NEEDED FOR NAUSEA OR VOMITING 60 tablet 2   traMADol (ULTRAM) 50 MG tablet TAKE 1 TABLET BY MOUTH EVERY 6 HOURS AS NEEDED FOR PAIN 60 tablet 2   No current facility-administered medications for this visit.   Facility-Administered Medications Ordered in Other Visits  Medication Dose Route Frequency Provider Last Rate Last Admin   0.9 %  sodium chloride infusion   Intravenous Continuous Jeralyn Ruths, MD   Stopped at 04/07/21 1142   heparin lock flush 100 unit/mL  500 Units Intravenous Once Jeralyn Ruths, MD       sodium chloride flush (NS) 0.9 % injection 10 mL  10 mL Intravenous PRN Jeralyn Ruths, MD   10 mL at 11/23/20 0932   sodium chloride flush (NS) 0.9 % injection 10 mL  10 mL Intravenous PRN Jeralyn Ruths, MD   10 mL at 04/18/22 1417   sodium chloride flush (NS) 0.9 % injection 10 mL  10 mL Intravenous Once Jeralyn Ruths, MD         VITAL SIGNS: There were no vitals taken for this visit. There were no vitals filed for this visit.  Estimated body mass index is 25.79 kg/m as calculated from the following:   Height as of 01/01/23: 5\' 2"  (1.575 m).   Weight as of an earlier encounter on 01/30/23: 64 kg (141 lb).  LABS: CBC:    Component Value Date/Time   WBC 4.3 01/01/2023 0930   HGB 11.5 (L) 01/01/2023 0930   HGB 9.1 (L) 11/06/2022 1255   HCT 35.2 (L) 01/01/2023 0930   PLT 85 (L) 01/01/2023 0930   PLT 75 (L) 11/06/2022 1255   MCV 106.3 (H)  01/01/2023 0930   NEUTROABS 3.1 01/01/2023 0930   LYMPHSABS 0.7 01/01/2023 0930   MONOABS 0.4 01/01/2023 0930   EOSABS 0.1 01/01/2023 0930   BASOSABS 0.0 01/01/2023 0930   Comprehensive Metabolic Panel:    Component Value Date/Time   NA 136 01/01/2023 0930   K 3.7 01/01/2023 0930   CL 104 01/01/2023 0930   CO2 24 01/01/2023 0930   BUN 25 (H) 01/01/2023 0930   CREATININE 1.39 (H) 01/01/2023 0930   GLUCOSE 279 (H) 01/01/2023 0930   CALCIUM 9.0 01/01/2023 0930   AST 27 01/01/2023 0930   ALT 32 01/01/2023 0930   ALKPHOS 114 01/01/2023 0930   BILITOT 0.5 01/01/2023 0930   PROT 6.6 01/01/2023 0930   ALBUMIN 4.2 01/01/2023 0930     Present during today's visit: patient only  Assessment and Plan: CMP/CBC reviewed, patient to continue lenalidomide 5mg  21 on/7 off   Oral Chemotherapy Side Effect/Intolerance:  Rash: patient had a rash following restarting her lenalidomide. She reports an itchy face and rash below her check. Rash resolved with Medrol dosepak.  She knows to call the office if her rash returns with starting the next cycle.  Of note, she needed to use slide scale insulin with her dinner when in the steroid No other side effects noted   Oral Chemotherapy Adherence: No missed doses reported No patient barriers to medication adherence identified.   New medications: none reported  Medication Access Issues: No issues, [facility]  Patient expressed understanding and was in agreement with this plan. She also understands that She can call clinic at any time with any questions, concerns, or complaints.   Follow-up plan: RTC in 4 weeks  Thank you for allowing me to participate in the care of this very pleasant patient.   Time Total: 15 mins  Visit consisted of counseling and education on dealing with issues of symptom management in the setting of serious and potentially life-threatening illness.Greater than 50%  of this time was spent counseling and coordinating care related to the above assessment and plan.  Signed by: Remi Haggard, PharmD, BCPS, Nolon Bussing, CPP Hematology/Oncology Clinical Pharmacist Practitioner [facility] [phone]  01/30/2023 3:15 PM

## 2023-01-30 NOTE — Progress Notes (Signed)
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  ITP.  INTERVAL HISTORY: Patient returns to clinic today for repeat laboratory work, further evaluation, and continuation of Zometa and dose reduced Revlimid.  She had a rash recently that resolved with a Medrol Dosepak.  She also continues to have knee and left hand pain which would prior injections.  She has occasional muscle cramping.  She otherwise feels well.  Her peripheral neuropathy is unchanged. She has no other neurologic complaints.  She denies any recent fevers or illnesses.  She denies any chest pain, shortness of breath, cough, or hemoptysis.  She denies any nausea, vomiting, constipation, or diarrhea.  She has no urinary complaints.  Patient offers no further specific complaints today.  REVIEW OF SYSTEMS:   Review of Systems  Constitutional: Negative.  Negative for fever, malaise/fatigue and weight loss.  HENT: Negative.  Negative for congestion and sinus pain.   Respiratory: Negative.  Negative for cough, hemoptysis and shortness of breath.   Cardiovascular:  Negative for chest pain and leg swelling.  Gastrointestinal: Negative.  Negative for abdominal pain, diarrhea and nausea.  Genitourinary: Negative.  Negative for dysuria.  Musculoskeletal:  Positive for joint pain. Negative for back pain and neck pain.  Skin: Negative.  Negative for rash.  Neurological:  Positive for sensory change. Negative for dizziness, tremors, focal weakness, weakness and headaches.  Endo/Heme/Allergies: Negative.  Does not bruise/bleed easily.  Psychiatric/Behavioral: Negative.  The patient is not nervous/anxious and does not have insomnia.     As per HPI. Otherwise, a complete review of systems is  negative.  PAST MEDICAL HISTORY: Past Medical History:  Diagnosis Date   Anxiety    Diabetes mellitus without complication (HCC) 2016   GERD (gastroesophageal reflux disease)    Hyperlipidemia    Myeloma (HCC)    Myeloma (HCC)    Personal history of colonic polyps    Squamous cell carcinoma of skin 11/25/2013   Left chest. WD SCC with superficial infiltration.   Tachycardia     PAST SURGICAL HISTORY: Past Surgical History:  Procedure Laterality Date   AUGMENTATION MAMMAPLASTY Bilateral 1983   silicone and replacement in 2001   BREAST ENHANCEMENT SURGERY  1983   CESAREAN SECTION  1987, 1989   COLONOSCOPY  1988, 1998,2003, 2008, 2013   COLONOSCOPY WITH PROPOFOL N/A 08/16/2016   Procedure: COLONOSCOPY WITH PROPOFOL;  Surgeon: Earline Mayotte, MD;  Location: [facility] ENDOSCOPY;  Service: Endoscopy;  Laterality: N/A;   COLONOSCOPY WITH PROPOFOL N/A 10/05/2021   Procedure: COLONOSCOPY WITH PROPOFOL;  Surgeon: Earline Mayotte, MD;  Location: [facility] ENDOSCOPY;  Service: Endoscopy;  Laterality: N/A;  HAS A PORT, NEEDS AMPICILLIN PER OFFICE   DILATION AND CURETTAGE OF UTERUS     ENDOMETRIAL ABLATION  12/1991   ganglion cyst removal      IR BONE MARROW BIOPSY & ASPIRATION  08/08/2022   IR IMAGING GUIDED PORT INSERTION  10/27/2020   IR IMAGING GUIDED PORT INSERTION  04/04/2021   KYPHOPLASTY N/A 10/28/2020   Procedure: T12 and L3 KYPHOPLASTY;  Surgeon: Kennedy Bucker, MD;  Location: [facility] ORS;  Service: Orthopedics;  Laterality: N/A;   TONSILLECTOMY     WRIST SURGERY Right 05/1995    FAMILY HISTORY: Family History  Problem Relation Age of Onset  Colon polyps Sister    Colon cancer Father 7   Diabetes Mother    Breast cancer Neg Hx     ADVANCED DIRECTIVES (Y/N):  N  HEALTH MAINTENANCE: Social History   Tobacco Use   Smoking status: Former    Current packs/day: 0.00    Average packs/day: 1 pack/day for 4.0 years (4.0 ttl pk-yrs)    Types: Cigarettes    Start date: 32    Quit  date: 1982    Years since quitting: 42.9   Smokeless tobacco: Never  Vaping Use   Vaping status: Never Used  Substance Use Topics   Alcohol use: Yes    Alcohol/week: 1.0 - 2.0 standard drink of alcohol    Types: 1 - 2 Glasses of wine per week    Comment: occassionally   Drug use: No     Colonoscopy:  PAP:  Bone density:  Lipid panel:  No Known Allergies  Current Outpatient Medications  Medication Sig Dispense Refill   acetaminophen (TYLENOL) 325 MG tablet Take 650 mg by mouth 2 (two) times a week. Premed for velcade. Taking Tuesdays and Friday     acyclovir (ZOVIRAX) 400 MG tablet TAKE 1 TABLET BY MOUTH TWICE DAILY 60 tablet 5   albuterol (VENTOLIN HFA) 108 (90 Base) MCG/ACT inhaler Inhale 2 puffs into the lungs every 4 (four) hours as needed.     ALPRAZolam (XANAX) 0.5 MG tablet TAKE 1 TABLET BY MOUTH AT BEDTIME AS NEEDED FOR ANXIETY 30 tablet 0   ascorbic acid (VITAMIN C) 500 MG tablet Take 500 mg by mouth daily.     atorvastatin (LIPITOR) 20 MG tablet Take 20 mg by mouth daily.     busPIRone (BUSPAR) 30 MG tablet Take 30 mg by mouth 2 (two) times daily.     Calcium Carb-Cholecalciferol (OYSTER SHELL CALCIUM/VITAMIN D) 500-5 MG-MCG PACK Take by mouth.     CALCIUM-VITAMIN D PO Take by mouth 2 (two) times daily.     cyclobenzaprine (FLEXERIL) 10 MG tablet TAKE 1 TABLET BY MOUTH AT BEDTIME 30 tablet 2   diphenhydrAMINE (BENADRYL) 25 mg capsule Take 25 mg by mouth 2 (two) times a week. Pre med for velcade. Only Tuesdays and Fridays     diphenoxylate-atropine (LOMOTIL) 2.5-0.025 MG tablet Take 1-2 tablets by mouth every 8 (eight) hours as needed for diarrhea or loose stools. 90 tablet 0   ferrous sulfate 325 (65 FE) MG tablet Take 325 mg by mouth daily with breakfast.     gabapentin (NEURONTIN) 300 MG capsule TAKE 1 CAPSULE BY MOUTH TWICE DAILY 60 capsule 2   ivabradine (CORLANOR) 5 MG TABS tablet Take 5 mg by mouth 2 (two) times daily with a meal.     lenalidomide (REVLIMID) 5 MG  capsule TAKE 1 CAPSULE BY MOUTH 1 TIME A DAY FOR 21 DAYS ON THEN 7 DAYS OFF 21 capsule 0   lidocaine-prilocaine (EMLA) cream APPLY 1 APPLICATION TOPICALLY AS NEEDED 30 g 2   liraglutide (VICTOZA) 18 MG/3ML SOPN Inject 1.2 mg into the skin daily.     Magnesium 400 MG TABS Take 1 tablet by mouth daily.     meloxicam (MOBIC) 15 MG tablet Take 15 mg by mouth daily.     methylPREDNISolone (MEDROL DOSEPAK) 4 MG TBPK tablet Take as directed by dosepak. 21 tablet 0   Multiple Vitamin (MULTI-VITAMIN) tablet Take 1 tablet by mouth daily.     omeprazole (PRILOSEC) 40 MG capsule Take 40 mg by mouth daily.  ondansetron (ZOFRAN) 8 MG tablet Take 1 tablet (8 mg total) by mouth 2 (two) times daily as needed (Nausea or vomiting). 60 tablet 2   oxyCODONE-acetaminophen (PERCOCET/ROXICET) 5-325 MG tablet TAKE 1-2 TABLETS BY MOUTH EVERY 4 HOURS AS NEEDED FOR SEVERE PAIN 90 tablet 0   prochlorperazine (COMPAZINE) 10 MG tablet TAKE 1 TABLET BY MOUTH EVERY 6 HOURS AS NEEDED FOR NAUSEA OR VOMITING 60 tablet 2   traMADol (ULTRAM) 50 MG tablet TAKE 1 TABLET BY MOUTH EVERY 6 HOURS AS NEEDED FOR PAIN 60 tablet 2   dexamethasone (DECADRON) 4 MG tablet Take 4 mg by mouth daily. 5 tabs po q28 days (Patient not taking: Reported on 10/10/2022)     methocarbamol (ROBAXIN) 500 MG tablet Take 1 tablet (500 mg total) by mouth every 6 (six) hours as needed for muscle spasms. (Patient not taking: Reported on 10/10/2022) 40 tablet 1   No current facility-administered medications for this visit.   Facility-Administered Medications Ordered in Other Visits  Medication Dose Route Frequency Provider Last Rate Last Admin   0.9 %  sodium chloride infusion   Intravenous Continuous Jeralyn Ruths, MD   Stopped at 04/07/21 1142   0.9 %  sodium chloride infusion   Intravenous PRN Jeralyn Ruths, MD       heparin lock flush 100 unit/mL  500 Units Intravenous Once Jeralyn Ruths, MD       sodium chloride flush (NS) 0.9 % injection  10 mL  10 mL Intravenous PRN Jeralyn Ruths, MD   10 mL at 11/23/20 0932   sodium chloride flush (NS) 0.9 % injection 10 mL  10 mL Intravenous PRN Jeralyn Ruths, MD   10 mL at 04/18/22 1417   sodium chloride flush (NS) 0.9 % injection 10 mL  10 mL Intravenous Once Jeralyn Ruths, MD       Zoledronic Acid (ZOMETA) IVPB 4 mg  4 mg Intravenous Once Jeralyn Ruths, MD        OBJECTIVE: Vitals:   01/30/23 1511  BP: (!) 136/59  Pulse: 78  Resp: 20  Temp: 97.9 F (36.6 C)  SpO2: 100%      Body mass index is 25.79 kg/m.    ECOG FS:0 - Asymptomatic  General: Well-developed, well-nourished, no acute distress. Eyes: Pink conjunctiva, anicteric sclera. HEENT: Normocephalic, moist mucous membranes. Lungs: No audible wheezing or coughing. Heart: Regular rate and rhythm. Abdomen: Soft, nontender, no obvious distention. Musculoskeletal: No edema, cyanosis, or clubbing. Neuro: Alert, answering all questions appropriately. Cranial nerves grossly intact. Skin: No rashes or petechiae noted. Psych: Normal affect.  LAB RESULTS:  Lab Results  Component Value Date   NA 137 01/30/2023   K 3.4 (L) 01/30/2023   CL 103 01/30/2023   CO2 23 01/30/2023   GLUCOSE 165 (H) 01/30/2023   BUN 21 01/30/2023   CREATININE 1.42 (H) 01/30/2023   CALCIUM 8.6 (L) 01/30/2023   PROT 6.3 (L) 01/30/2023   ALBUMIN 3.9 01/30/2023   AST 26 01/30/2023   ALT 29 01/30/2023   ALKPHOS 88 01/30/2023   BILITOT 0.5 01/30/2023   GFRNONAA 41 (L) 01/30/2023    Lab Results  Component Value Date   WBC 2.8 (L) 01/30/2023   NEUTROABS 1.4 (L) 01/30/2023   HGB 11.6 (L) 01/30/2023   HCT 35.6 (L) 01/30/2023   MCV 105.0 (H) 01/30/2023   PLT 108 (L) 01/30/2023     STUDIES: No results found.  ONCOLOGY HISTORY:  Diagnosis confirmed from bone biopsy  on August 17, 2020.  Initially her lambda free light chains were elevated at 432.1, but now are within normal limits at 12.1.  Immunoglobulins and SPEP are  normal.  Bone marrow biopsy completed on August 26, 2020 revealed 25% plasma cells along with the deletion of 1p which is considered poor prognosis.  PET scan results from September 06, 2020 reviewed independently with 3 hypermetabolic lesions in right C5-6 facets, right iliac crest lesion, and L3 vertebral lesion.  After lengthy discussion with the patient, she agreed to pursue chemotherapy with daratumumab, Velcade, Revlimid, and dexamethasone followed by autologous bone marrow transplant.  Patient received weekly daratumumab for 4 cycles along with Velcade on days 1, 4, 8, and 11.  She also received Revlimid on days 1 through 14 of a 21-day cycle.  She completed cycle 4 of treatment on January 14, 2021.  Subsequent bone marrow biopsy on January 24, 2021 revealed complete remission with no evidence of myeloma.  Patient underwent autologous stem cell transplant at [facility] on February 24, 2021.  She was readmitted to the hospital on March 04, 2021 with neutropenic fever, diarrhea, and sepsis-like symptoms.  Infectious disease work-up was negative.   Patient previously received consolidation treatment with the GRIFFIN regimen.  She received consolidation with cycle 5 and 6 using daratumumab on day 1, Velcade on day 1, 4, 8, and 11, Revlimid 10 mg on days 1 through 14 with 7 days off, and weekly dexamethasone on a 21-day cycle.  She was on maintenance daratumumab on day 1 and Revlimid 10 mg on days 1 through 21 on a 28-day cycle for cycles 7 through 32.  Her most recent PET scan on July 31, 2022 reviewed independently and report as above with no obvious evidence of recurrent disease.  A second bone marrow biopsy was completed on August 08, 2022 which also revealed no evidence of disease with normal cytogenetics.    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  PLAN:      Lambda light chain myeloma with 1p del:  Given her persistent pancytopenia, maintenance Revlimid was  discontinued in approximately July 2024 and then reinitiated in November 2024.  Her most recent bone marrow biopsy at [facility] on November 07, 2022 reported complete remission.  Continue dose reduced to 5 mg Revlimid daily.  Proceed with Zometa today.  Return to clinic in 4 weeks for laboratory work and further evaluation.  Will transition Zometa to every 12 weeks.  Appreciate clinical pharmacy input. Thrombocytopenia: Improving.  Platelet count is 108.  Continue low-dose Revlimid as above.  Anemia: Chronic and unchanged.  Patient's hemoglobin is 11.6. Leukopenia: Mild, monitor. Hypocalcemia: Mild, monitor. Renal insufficiency: Chronic and unchanged.  Patient creatinine is 1.42 today. Vaccinations: Continue revaccination schedule as per [facility]. Pulmonary nodule: Patient noted to have a 9 mm hypermetabolic pulmonary nodule in her left lower lobe lung.  Possibly infectious, continue to monitor closely. Left knee pain: Chronic and unchanged.  Continue conservative management and injections as needed per orthopedics. Hand pain: Continue conservative management and injections as needed per orthopedics Neuropathy: Patient was previously given a referral to neurology.  She reports her appointment is in December 2024.  We discussed possibly increasing her gabapentin dose, but patient does not wish to do this at this time. Rash: Resolved with Medrol Dosepak.  Patient expressed understanding and was in agreement with this plan. She also understands that She can call clinic at any time with any questions, concerns,  or complaints.      Cancer Staging  Lambda light chain myeloma (HCC) Staging form: Plasma Cell Myeloma and Plasma Cell Disorders, AJCC 8th Edition - Clinical stage from 09/16/2020: RISS Stage I (Beta-2-microglobulin (mg/L): 2.5, Albumin (g/dL): 4.9, ISS: Stage I, High-risk cytogenetics: Absent, LDH: Normal) - Signed by Jeralyn Ruths, MD on 09/16/2020 Beta 2 microglobulin  range (mg/L): Less than 3.5 Albumin range (g/dL): Greater than or equal to 3.5 Cytogenetics: 1p deletion  Jeralyn Ruths, MD   01/30/2023 3:59 PM

## 2023-01-31 LAB — KAPPA/LAMBDA LIGHT CHAINS
Kappa free light chain: 22 mg/L — ABNORMAL HIGH (ref 3.3–19.4)
Kappa, lambda light chain ratio: 1.59 (ref 0.26–1.65)
Lambda free light chains: 13.8 mg/L (ref 5.7–26.3)

## 2023-02-07 ENCOUNTER — Other Ambulatory Visit: Payer: Self-pay | Admitting: Pharmacist

## 2023-02-07 DIAGNOSIS — R21 Rash and other nonspecific skin eruption: Secondary | ICD-10-CM

## 2023-02-07 MED ORDER — PREDNISONE 10 MG PO TABS: 10.0000 mg | ORAL_TABLET | Freq: Every day | ORAL | 0 refills | Status: DC

## 2023-02-07 NOTE — Telephone Encounter (Signed)
Spoke with Hannah Mcdonald, prednisone prescription sent to her local pharmacy. She will continue to take her diphenhydramine daily, offered the option of non-drowsy antihistamine but she reports diphenhydramine no longer makes her drowsy.   She knows to call the office if her rash does not clear or returns.

## 2023-02-15 ENCOUNTER — Other Ambulatory Visit: Payer: Self-pay | Admitting: Oncology

## 2023-02-19 ENCOUNTER — Other Ambulatory Visit: Payer: Self-pay | Admitting: Oncology

## 2023-02-19 DIAGNOSIS — C9000 Multiple myeloma not having achieved remission: Secondary | ICD-10-CM

## 2023-02-25 ENCOUNTER — Encounter: Payer: Self-pay | Admitting: Oncology

## 2023-02-26 NOTE — Telephone Encounter (Signed)
Spoke with Hannah Mcdonald, she will continue taking her Benadryl daily and let us know if her rash returns with restarting her lenalidomide. If her rash returns, we can again do prednisone 10mg  x3 days.   Also let her know we would not be able to drawl labs for an outside provider.

## 2023-02-27 ENCOUNTER — Ambulatory Visit: Payer: Medicare PPO | Admitting: Oncology

## 2023-02-27 ENCOUNTER — Other Ambulatory Visit: Payer: Medicare PPO

## 2023-03-02 ENCOUNTER — Inpatient Hospital Stay: Payer: Medicare PPO

## 2023-03-02 ENCOUNTER — Inpatient Hospital Stay: Payer: Medicare PPO | Admitting: Oncology

## 2023-03-06 ENCOUNTER — Encounter: Payer: Self-pay | Admitting: Oncology

## 2023-03-07 ENCOUNTER — Inpatient Hospital Stay: Payer: Medicare PPO | Attending: Oncology | Admitting: Oncology

## 2023-03-07 ENCOUNTER — Inpatient Hospital Stay: Payer: Medicare PPO | Admitting: Pharmacist

## 2023-03-07 ENCOUNTER — Encounter: Payer: Self-pay | Admitting: Oncology

## 2023-03-07 ENCOUNTER — Inpatient Hospital Stay: Payer: Medicare PPO | Attending: Oncology

## 2023-03-07 VITALS — BP 133/53 | HR 85 | Temp 98.2°F | Wt 141.8 lb

## 2023-03-07 DIAGNOSIS — G629 Polyneuropathy, unspecified: Secondary | ICD-10-CM | POA: Diagnosis not present

## 2023-03-07 DIAGNOSIS — Z800 Family history of malignant neoplasm of digestive organs: Secondary | ICD-10-CM | POA: Diagnosis not present

## 2023-03-07 DIAGNOSIS — Z79899 Other long term (current) drug therapy: Secondary | ICD-10-CM | POA: Diagnosis not present

## 2023-03-07 DIAGNOSIS — Z79624 Long term (current) use of inhibitors of nucleotide synthesis: Secondary | ICD-10-CM | POA: Insufficient documentation

## 2023-03-07 DIAGNOSIS — Z833 Family history of diabetes mellitus: Secondary | ICD-10-CM | POA: Insufficient documentation

## 2023-03-07 DIAGNOSIS — R21 Rash and other nonspecific skin eruption: Secondary | ICD-10-CM | POA: Insufficient documentation

## 2023-03-07 DIAGNOSIS — Z83719 Family history of colon polyps, unspecified: Secondary | ICD-10-CM | POA: Diagnosis not present

## 2023-03-07 DIAGNOSIS — C9000 Multiple myeloma not having achieved remission: Secondary | ICD-10-CM | POA: Diagnosis not present

## 2023-03-07 DIAGNOSIS — Z860100 Personal history of colon polyps, unspecified: Secondary | ICD-10-CM | POA: Insufficient documentation

## 2023-03-07 DIAGNOSIS — Z9089 Acquired absence of other organs: Secondary | ICD-10-CM | POA: Insufficient documentation

## 2023-03-07 DIAGNOSIS — D693 Immune thrombocytopenic purpura: Secondary | ICD-10-CM | POA: Diagnosis present

## 2023-03-07 DIAGNOSIS — Z7969 Long term (current) use of other immunomodulators and immunosuppressants: Secondary | ICD-10-CM | POA: Insufficient documentation

## 2023-03-07 DIAGNOSIS — C9001 Multiple myeloma in remission: Secondary | ICD-10-CM | POA: Diagnosis present

## 2023-03-07 DIAGNOSIS — E119 Type 2 diabetes mellitus without complications: Secondary | ICD-10-CM | POA: Insufficient documentation

## 2023-03-07 DIAGNOSIS — Z85828 Personal history of other malignant neoplasm of skin: Secondary | ICD-10-CM | POA: Diagnosis not present

## 2023-03-07 DIAGNOSIS — N289 Disorder of kidney and ureter, unspecified: Secondary | ICD-10-CM | POA: Insufficient documentation

## 2023-03-07 DIAGNOSIS — Z87891 Personal history of nicotine dependence: Secondary | ICD-10-CM | POA: Diagnosis not present

## 2023-03-07 MED ORDER — PREDNISONE 10 MG PO TABS: 10.0000 mg | ORAL_TABLET | Freq: Every day | ORAL | 3 refills | Status: DC

## 2023-03-07 NOTE — Progress Notes (Signed)
 Clinical Pharmacist Practitioner Clinic [facility]  [phone] [phone]  Patient Care Team: Corlis Honor BROCKS, MD as PCP - General (Internal Medicine)   Name of the patient: Hannah Mcdonald  [id number]  09-Dec-1955   Date of visit: 03/07/23  HPI: Patient is a 68 y.o. female with lambda light chain myeloma s/p transplant. Maintenance lenalidomide  was held due to pancytopenia, but with count improvement patient restarted lenalidomide  in Nov 2024.   Reason for Consult: Oral chemotherapy follow-up for lenalidomide  therapy.   PAST MEDICAL HISTORY: Past Medical History:  Diagnosis Date   Anxiety    Diabetes mellitus without complication (HCC) 2016   GERD (gastroesophageal reflux disease)    Hyperlipidemia    Myeloma (HCC)    Myeloma (HCC)    Personal history of colonic polyps    Squamous cell carcinoma of skin 11/25/2013   Left chest. WD SCC with superficial infiltration.   Tachycardia     HEMATOLOGY/ONCOLOGY HISTORY:  Oncology History  Lambda light chain myeloma (HCC)  08/24/2020 Initial Diagnosis   Lambda light chain myeloma (HCC)   09/16/2020 Cancer Staging   Staging form: Plasma Cell Myeloma and Plasma Cell Disorders, AJCC 8th Edition - Clinical stage from 09/16/2020: RISS Stage I (Beta-2 -microglobulin (mg/L): 2.5, Albumin (g/dL): 4.9, ISS: Stage I, High-risk cytogenetics: Absent, LDH: Normal) - Signed by Jacobo Evalene PARAS, MD on 09/16/2020 Beta 2 microglobulin range (mg/L): Less than 3.5 Albumin range (g/dL): Greater than or equal to 3.5 Cytogenetics: 1p deletion   11/02/2020 - 07/22/2021 Chemotherapy   Patient is on Treatment Plan : POST TRANSPLANT DaraVRd (Daratumumab  SQ) q21d x 2 Cycles (Consolidation)- cycle 5&6     08/02/2021 - 09/27/2021 Chemotherapy   Patient is on Treatment Plan : MYELOMA Daratumumab  SQ q28d- Maintainance treatment w/ revlimid      08/02/2021 - 08/15/2022 Chemotherapy   Patient is on Treatment Plan : MYELOMA  POST-TRANSPLANT MAINTENANE Daratumumab  SQ + Lenalidomide  q28d (Maintenance)       ALLERGIES:  has no known allergies.  MEDICATIONS:  Current Outpatient Medications  Medication Sig Dispense Refill   acetaminophen  (TYLENOL ) 325 MG tablet Take 650 mg by mouth 2 (two) times a week. Premed for velcade . Taking Tuesdays and Friday     acyclovir  (ZOVIRAX ) 400 MG tablet TAKE 1 TABLET BY MOUTH TWICE DAILY 60 tablet 5   albuterol (VENTOLIN HFA) 108 (90 Base) MCG/ACT inhaler Inhale 2 puffs into the lungs every 4 (four) hours as needed.     ALPRAZolam  (XANAX ) 0.5 MG tablet TAKE 1 TABLET BY MOUTH AT BEDTIME AS NEEDED FOR ANXIETY 30 tablet 0   ascorbic acid (VITAMIN C) 500 MG tablet Take 500 mg by mouth daily.     atorvastatin  (LIPITOR) 20 MG tablet Take 20 mg by mouth daily.     busPIRone  (BUSPAR ) 30 MG tablet Take 30 mg by mouth 2 (two) times daily.     Calcium  Carb-Cholecalciferol  (OYSTER SHELL CALCIUM /VITAMIN D) 500-5 MG-MCG PACK Take by mouth.     CALCIUM -VITAMIN D PO Take by mouth 2 (two) times daily.     cyclobenzaprine  (FLEXERIL ) 10 MG tablet TAKE 1 TABLET BY MOUTH AT BEDTIME 30 tablet 2   dexamethasone  (DECADRON ) 4 MG tablet Take 4 mg by mouth daily. 5 tabs po q28 days (Patient not taking: Reported on 03/07/2023)     diphenhydrAMINE  (BENADRYL ) 25 mg capsule Take 25 mg by mouth daily.     diphenoxylate -atropine  (LOMOTIL ) 2.5-0.025 MG tablet Take 1-2 tablets by mouth every 8 (eight) hours as needed for  diarrhea or loose stools. 90 tablet 0   ferrous sulfate 325 (65 FE) MG tablet Take 325 mg by mouth daily with breakfast.     gabapentin  (NEURONTIN ) 300 MG capsule TAKE 1 CAPSULE BY MOUTH TWICE DAILY 60 capsule 2   ivabradine (CORLANOR) 5 MG TABS tablet Take 5 mg by mouth 2 (two) times daily with a meal.     lenalidomide  (REVLIMID ) 5 MG capsule TAKE 1 CAPSULE BY MOUTH 1 TIME A DAY FOR 21 DAYS ON THEN 7 DAYS OFF 21 capsule 0   lidocaine -prilocaine  (EMLA ) cream APPLY 1 APPLICATION TOPICALLY AS NEEDED 30 g  2   liraglutide  (VICTOZA ) 18 MG/3ML SOPN Inject 1.2 mg into the skin daily.     Magnesium  400 MG TABS Take 1 tablet by mouth daily.     meloxicam  (MOBIC ) 15 MG tablet Take 15 mg by mouth daily.     methocarbamol  (ROBAXIN ) 500 MG tablet Take 1 tablet (500 mg total) by mouth every 6 (six) hours as needed for muscle spasms. (Patient not taking: Reported on 10/10/2022) 40 tablet 1   Multiple Vitamin (MULTI-VITAMIN) tablet Take 1 tablet by mouth daily.     omeprazole (PRILOSEC) 40 MG capsule Take 40 mg by mouth daily.     ondansetron  (ZOFRAN ) 8 MG tablet Take 1 tablet (8 mg total) by mouth 2 (two) times daily as needed (Nausea or vomiting). 60 tablet 2   oxyCODONE -acetaminophen  (PERCOCET/ROXICET) 5-325 MG tablet TAKE 1-2 TABLETS BY MOUTH EVERY 4 HOURS AS NEEDED FOR SEVERE PAIN 90 tablet 0   predniSONE  (DELTASONE ) 10 MG tablet Take 1 tablet (10 mg total) by mouth daily. Take with food. Take every 28 days as needed for Revlimid  rash. 3 tablet 3   prochlorperazine  (COMPAZINE ) 10 MG tablet TAKE 1 TABLET BY MOUTH EVERY 6 HOURS AS NEEDED FOR NAUSEA OR VOMITING 60 tablet 2   traMADol  (ULTRAM ) 50 MG tablet TAKE 1 TABLET BY MOUTH EVERY 6 HOURS AS NEEDED FOR PAIN 60 tablet 2   No current facility-administered medications for this visit.   Facility-Administered Medications Ordered in Other Visits  Medication Dose Route Frequency Provider Last Rate Last Admin   0.9 %  sodium chloride  infusion   Intravenous Continuous Finnegan, Timothy J, MD   Stopped at 04/07/21 1142   heparin  lock flush 100 unit/mL  500 Units Intravenous Once Finnegan, Timothy J, MD       sodium chloride  flush (NS) 0.9 % injection 10 mL  10 mL Intravenous PRN Finnegan, Timothy J, MD   10 mL at 11/23/20 0932   sodium chloride  flush (NS) 0.9 % injection 10 mL  10 mL Intravenous PRN Finnegan, Timothy J, MD   10 mL at 04/18/22 1417   sodium chloride  flush (NS) 0.9 % injection 10 mL  10 mL Intravenous Once Finnegan, Timothy J, MD        VITAL  SIGNS: There were no vitals taken for this visit. There were no vitals filed for this visit.  Estimated body mass index is 25.94 kg/m as calculated from the following:   Height as of 01/01/23: 5' 2 (1.575 m).   Weight as of an earlier encounter on 03/07/23: 64.3 kg (141 lb 12.8 oz).  LABS: CBC:    Component Value Date/Time   WBC 2.8 (L) 01/30/2023 1455   HGB 11.6 (L) 01/30/2023 1455   HGB 9.1 (L) 11/06/2022 1255   HCT 35.6 (L) 01/30/2023 1455   PLT 108 (L) 01/30/2023 1455   PLT 75 (L) 11/06/2022 1255  MCV 105.0 (H) 01/30/2023 1455   NEUTROABS 1.4 (L) 01/30/2023 1455   LYMPHSABS 0.8 01/30/2023 1455   MONOABS 0.4 01/30/2023 1455   EOSABS 0.1 01/30/2023 1455   BASOSABS 0.0 01/30/2023 1455   Comprehensive Metabolic Panel:    Component Value Date/Time   NA 137 01/30/2023 1455   K 3.4 (L) 01/30/2023 1455   CL 103 01/30/2023 1455   CO2 23 01/30/2023 1455   BUN 21 01/30/2023 1455   CREATININE 1.42 (H) 01/30/2023 1455   GLUCOSE 165 (H) 01/30/2023 1455   CALCIUM  8.6 (L) 01/30/2023 1455   AST 26 01/30/2023 1455   ALT 29 01/30/2023 1455   ALKPHOS 88 01/30/2023 1455   BILITOT 0.5 01/30/2023 1455   PROT 6.3 (L) 01/30/2023 1455   ALBUMIN 3.9 01/30/2023 1455     Present during today's visit: patient only  Assessment and Plan: CMP/CBC reviewed, patient to continue lenalidomide  5mg  21 on/7 off With platelet count improvement, could consider increasing lenalidomide  to 10mg  in the future. (Patient reports having a full bottle of 10mg  capsules on hand.) MD would like to wait until counts returned to normal and remain there for a bit   Oral Chemotherapy Side Effect/Intolerance:  Rash:patient having a rash again with the cycle of lenalidomide  she started on 03/04/23, Rash appears about 3 days into treatment. Rash present on face and upper chest. Per patient this is the normal pattern of the rash. Patient continues to take diphenhydramine  daily Prednisone  prescription sent to local  pharmacy, rx will have refills so patient can use as needed in future cycles Per patient [facility] offered her the option of lenalidomide  desensitizing program, she may consider this in the future No other side effects noted   Oral Chemotherapy Adherence: No missed doses reported No patient barriers to medication adherence identified.   New medications: none reported  Medication Access Issues: No issues, patient fills medication at [facility]  Patient expressed understanding and was in agreement with this plan. She also understands that She can call clinic at any time with any questions, concerns, or complaints.   Follow-up plan: RTC in 4 weeks  Thank you for allowing me to participate in the care of this very pleasant patient.   Time Total: 15 mins  Visit consisted of counseling and education on dealing with issues of symptom management in the setting of serious and potentially life-threatening illness.Greater than 50%  of this time was spent counseling and coordinating care related to the above assessment and plan.  Signed by: Madysin Crisp N. Eveny Anastas, PharmD, BCPS, NEILA, CPP Hematology/Oncology Clinical Pharmacist Practitioner [place] [phone]  03/07/2023 11:57 AM

## 2023-03-07 NOTE — Progress Notes (Signed)
 Concerns with sleep. Would like to see about increasing xanax dose. Still has the rash when taking revlimid.

## 2023-03-07 NOTE — Progress Notes (Signed)
 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  ITP.  INTERVAL HISTORY: Patient returns to clinic today for repeat laboratory work and further evaluation.  She continues to have a rash that is likely related to her Revlimid , but otherwise feels well.  She does not complain of any joint pain today. Her peripheral neuropathy is unchanged. She has no other neurologic complaints.  She denies any recent fevers or illnesses.  She denies any chest pain, shortness of breath, cough, or hemoptysis.  She denies any nausea, vomiting, constipation, or diarrhea.  She has no urinary complaints.  Patient offers no further specific complaints today.  REVIEW OF SYSTEMS:   Review of Systems  Constitutional: Negative.  Negative for fever, malaise/fatigue and weight loss.  HENT: Negative.  Negative for congestion and sinus pain.   Respiratory: Negative.  Negative for cough, hemoptysis and shortness of breath.   Cardiovascular:  Negative for chest pain and leg swelling.  Gastrointestinal: Negative.  Negative for abdominal pain, diarrhea and nausea.  Genitourinary: Negative.  Negative for dysuria.  Musculoskeletal:  Negative for back pain, joint pain and neck pain.  Skin:  Positive for rash.  Neurological:  Positive for sensory change. Negative for dizziness, tremors, focal weakness, weakness and headaches.  Endo/Heme/Allergies: Negative.  Does not bruise/bleed easily.  Psychiatric/Behavioral: Negative.  The patient is not nervous/anxious and does not have insomnia.     As per HPI. Otherwise, a complete review of systems is negative.  PAST MEDICAL HISTORY: Past Medical History:  Diagnosis Date   Anxiety    Diabetes mellitus without complication (HCC) 2016   GERD  (gastroesophageal reflux disease)    Hyperlipidemia    Myeloma (HCC)    Myeloma (HCC)    Personal history of colonic polyps    Squamous cell carcinoma of skin 11/25/2013   Left chest. WD SCC with superficial infiltration.   Tachycardia     PAST SURGICAL HISTORY: Past Surgical History:  Procedure Laterality Date   AUGMENTATION MAMMAPLASTY Bilateral 1983   silicone and replacement in 2001   BREAST ENHANCEMENT SURGERY  1983   CESAREAN SECTION  1987, 1989   COLONOSCOPY  1988, 1998,2003, 2008, 2013   COLONOSCOPY WITH PROPOFOL  N/A 08/16/2016   Procedure: COLONOSCOPY WITH PROPOFOL ;  Surgeon: Dessa Reyes ORN, MD;  Location: [facility] ENDOSCOPY;  Service: Endoscopy;  Laterality: N/A;   COLONOSCOPY WITH PROPOFOL  N/A 10/05/2021   Procedure: COLONOSCOPY WITH PROPOFOL ;  Surgeon: Dessa Reyes ORN, MD;  Location: [facility] ENDOSCOPY;  Service: Endoscopy;  Laterality: N/A;  HAS A PORT, NEEDS AMPICILLIN  PER OFFICE   DILATION AND CURETTAGE OF UTERUS     ENDOMETRIAL ABLATION  12/1991   ganglion cyst removal      IR BONE MARROW BIOPSY & ASPIRATION  08/08/2022   IR IMAGING GUIDED PORT INSERTION  10/27/2020   IR IMAGING GUIDED PORT INSERTION  04/04/2021   KYPHOPLASTY N/A 10/28/2020   Procedure: T12 and L3 KYPHOPLASTY;  Surgeon: Kathlynn Sharper, MD;  Location: [facility] ORS;  Service: Orthopedics;  Laterality: N/A;   TONSILLECTOMY     WRIST SURGERY Right 05/1995    FAMILY HISTORY: Family History  Problem Relation Age of Onset   Colon polyps Sister    Colon cancer Father 81   Diabetes Mother    Breast cancer Neg  Hx     ADVANCED DIRECTIVES (Y/N):  N  HEALTH MAINTENANCE: Social History   Tobacco Use   Smoking status: Former    Current packs/day: 0.00    Average packs/day: 1 pack/day for 4.0 years (4.0 ttl pk-yrs)    Types: Cigarettes    Start date: 49    Quit date: 1982    Years since quitting: 43.0   Smokeless tobacco: Never  Vaping Use   Vaping status: Never Used  Substance Use Topics   Alcohol  use: Yes    Alcohol/week: 1.0 - 2.0 standard drink of alcohol    Types: 1 - 2 Glasses of wine per week    Comment: occassionally   Drug use: No     Colonoscopy:  PAP:  Bone density:  Lipid panel:  No Known Allergies  Current Outpatient Medications  Medication Sig Dispense Refill   acetaminophen  (TYLENOL ) 325 MG tablet Take 650 mg by mouth 2 (two) times a week. Premed for velcade . Taking Tuesdays and Friday     acyclovir  (ZOVIRAX ) 400 MG tablet TAKE 1 TABLET BY MOUTH TWICE DAILY 60 tablet 5   albuterol (VENTOLIN HFA) 108 (90 Base) MCG/ACT inhaler Inhale 2 puffs into the lungs every 4 (four) hours as needed.     ALPRAZolam  (XANAX ) 0.5 MG tablet TAKE 1 TABLET BY MOUTH AT BEDTIME AS NEEDED FOR ANXIETY 30 tablet 0   ascorbic acid (VITAMIN C) 500 MG tablet Take 500 mg by mouth daily.     atorvastatin  (LIPITOR) 20 MG tablet Take 20 mg by mouth daily.     busPIRone  (BUSPAR ) 30 MG tablet Take 30 mg by mouth 2 (two) times daily.     Calcium  Carb-Cholecalciferol  (OYSTER SHELL CALCIUM /VITAMIN D) 500-5 MG-MCG PACK Take by mouth.     CALCIUM -VITAMIN D PO Take by mouth 2 (two) times daily.     cyclobenzaprine  (FLEXERIL ) 10 MG tablet TAKE 1 TABLET BY MOUTH AT BEDTIME 30 tablet 2   diphenhydrAMINE  (BENADRYL ) 25 mg capsule Take 25 mg by mouth daily.     diphenoxylate -atropine  (LOMOTIL ) 2.5-0.025 MG tablet Take 1-2 tablets by mouth every 8 (eight) hours as needed for diarrhea or loose stools. 90 tablet 0   ferrous sulfate 325 (65 FE) MG tablet Take 325 mg by mouth daily with breakfast.     gabapentin  (NEURONTIN ) 300 MG capsule TAKE 1 CAPSULE BY MOUTH TWICE DAILY 60 capsule 2   ivabradine (CORLANOR) 5 MG TABS tablet Take 5 mg by mouth 2 (two) times daily with a meal.     lenalidomide  (REVLIMID ) 5 MG capsule TAKE 1 CAPSULE BY MOUTH 1 TIME A DAY FOR 21 DAYS ON THEN 7 DAYS OFF 21 capsule 0   lidocaine -prilocaine  (EMLA ) cream APPLY 1 APPLICATION TOPICALLY AS NEEDED 30 g 2   liraglutide  (VICTOZA ) 18 MG/3ML  SOPN Inject 1.2 mg into the skin daily.     Magnesium  400 MG TABS Take 1 tablet by mouth daily.     meloxicam  (MOBIC ) 15 MG tablet Take 15 mg by mouth daily.     Multiple Vitamin (MULTI-VITAMIN) tablet Take 1 tablet by mouth daily.     omeprazole (PRILOSEC) 40 MG capsule Take 40 mg by mouth daily.     ondansetron  (ZOFRAN ) 8 MG tablet Take 1 tablet (8 mg total) by mouth 2 (two) times daily as needed (Nausea or vomiting). 60 tablet 2   oxyCODONE -acetaminophen  (PERCOCET/ROXICET) 5-325 MG tablet TAKE 1-2 TABLETS BY MOUTH EVERY 4 HOURS AS NEEDED FOR SEVERE PAIN 90 tablet 0  predniSONE  (DELTASONE ) 10 MG tablet Take 1 tablet (10 mg total) by mouth daily. Take with food. 3 tablet 0   prochlorperazine  (COMPAZINE ) 10 MG tablet TAKE 1 TABLET BY MOUTH EVERY 6 HOURS AS NEEDED FOR NAUSEA OR VOMITING 60 tablet 2   traMADol  (ULTRAM ) 50 MG tablet TAKE 1 TABLET BY MOUTH EVERY 6 HOURS AS NEEDED FOR PAIN 60 tablet 2   dexamethasone  (DECADRON ) 4 MG tablet Take 4 mg by mouth daily. 5 tabs po q28 days (Patient not taking: Reported on 03/07/2023)     methocarbamol  (ROBAXIN ) 500 MG tablet Take 1 tablet (500 mg total) by mouth every 6 (six) hours as needed for muscle spasms. (Patient not taking: Reported on 10/10/2022) 40 tablet 1   No current facility-administered medications for this visit.   Facility-Administered Medications Ordered in Other Visits  Medication Dose Route Frequency Provider Last Rate Last Admin   0.9 %  sodium chloride  infusion   Intravenous Continuous Suprina Mandeville J, MD   Stopped at 04/07/21 1142   heparin  lock flush 100 unit/mL  500 Units Intravenous Once Hara Milholland J, MD       sodium chloride  flush (NS) 0.9 % injection 10 mL  10 mL Intravenous PRN Harriett Azar J, MD   10 mL at 11/23/20 0932   sodium chloride  flush (NS) 0.9 % injection 10 mL  10 mL Intravenous PRN Marixa Mellott J, MD   10 mL at 04/18/22 1417   sodium chloride  flush (NS) 0.9 % injection 10 mL  10 mL Intravenous Once  England Greb J, MD        OBJECTIVE: Vitals:   03/07/23 1059  BP: (!) 133/53  Pulse: 85  Temp: 98.2 F (36.8 C)  SpO2: 100%      Body mass index is 25.94 kg/m.    ECOG FS:0 - Asymptomatic  General: Well-developed, well-nourished, no acute distress. Eyes: Pink conjunctiva, anicteric sclera. HEENT: Normocephalic, moist mucous membranes. Lungs: No audible wheezing or coughing. Heart: Regular rate and rhythm. Abdomen: Soft, nontender, no obvious distention. Musculoskeletal: No edema, cyanosis, or clubbing. Neuro: Alert, answering all questions appropriately. Cranial nerves grossly intact. Skin: Macular papular rash noted on head and torso consistent with drug rash.   Psych: Normal affect.   LAB RESULTS:  Lab Results  Component Value Date   NA 137 01/30/2023   K 3.4 (L) 01/30/2023   CL 103 01/30/2023   CO2 23 01/30/2023   GLUCOSE 165 (H) 01/30/2023   BUN 21 01/30/2023   CREATININE 1.42 (H) 01/30/2023   CALCIUM  8.6 (L) 01/30/2023   PROT 6.3 (L) 01/30/2023   ALBUMIN 3.9 01/30/2023   AST 26 01/30/2023   ALT 29 01/30/2023   ALKPHOS 88 01/30/2023   BILITOT 0.5 01/30/2023   GFRNONAA 41 (L) 01/30/2023    Lab Results  Component Value Date   WBC 2.8 (L) 01/30/2023   NEUTROABS 1.4 (L) 01/30/2023   HGB 11.6 (L) 01/30/2023   HCT 35.6 (L) 01/30/2023   MCV 105.0 (H) 01/30/2023   PLT 108 (L) 01/30/2023     STUDIES: No results found.  ONCOLOGY HISTORY:  Diagnosis confirmed from bone biopsy on August 17, 2020.  Initially her lambda free light chains were elevated at 432.1, but now are within normal limits at 12.1.  Immunoglobulins and SPEP are normal.  Bone marrow biopsy completed on August 26, 2020 revealed 25% plasma cells along with the deletion of 1p which is considered poor prognosis.  PET scan results from September 06, 2020 reviewed  independently with 3 hypermetabolic lesions in right C5-6 facets, right iliac crest lesion, and L3 vertebral lesion.  After lengthy discussion  with the patient, she agreed to pursue chemotherapy with daratumumab , Velcade , Revlimid , and dexamethasone  followed by autologous bone marrow transplant.  Patient received weekly daratumumab  for 4 cycles along with Velcade  on days 1, 4, 8, and 11.  She also received Revlimid  on days 1 through 14 of a 21-day cycle.  She completed cycle 4 of treatment on January 14, 2021.  Subsequent bone marrow biopsy on January 24, 2021 revealed complete remission with no evidence of myeloma.  Patient underwent autologous stem cell [facility] on February 24, 2021.  She was readmitted to the hospital on March 04, 2021 with neutropenic fever, diarrhea, and sepsis-like symptoms.  Infectious disease work-up was negative.   Patient previously received consolidation treatment with the GRIFFIN regimen.  She received consolidation with cycle 5 and 6 using daratumumab  on day 1, Velcade  on day 1, 4, 8, and 11, Revlimid  10 mg on days 1 through 14 with 7 days off, and weekly dexamethasone  on a 21-day cycle.  She was on maintenance daratumumab  on day 1 and Revlimid  10 mg on days 1 through 21 on a 28-day cycle for cycles 7 through 32.  Her most recent PET scan on July 31, 2022 reviewed independently and report as above with no obvious evidence of recurrent disease.  A second bone marrow biopsy was completed on August 08, 2022 which also revealed no evidence of disease with normal cytogenetics.    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  PLAN:      Lambda light chain myeloma with 1p del:  Given her persistent pancytopenia, maintenance Revlimid  was discontinued in approximately July 2024 and then reinitiated in November 2024.  Her most recent bone marrow biopsy at [facility] on November 07, 2022 reported complete remission.  Laboratory work at [facility] on March 05, 2023 reviewed continued complete remission.  Continue dose reduced to 5 mg Revlimid  daily.  Return to clinic  in 4 weeks for laboratory work and evaluation by clinical pharmacy.  Patient would then return to clinic in 8 weeks for further evaluation and continuation of Zometa .  Appreciate clinical pharmacy input. Thrombocytopenia: Improving.  Platelet count at [facility] was 134.  Continue low-dose Revlimid  as above.  Anemia: Chronic and unchanged.  Patient's hemoglobin is stable at 11.6.   Leukopenia: Resolved. Hypocalcemia: Mild, monitor. Renal insufficiency: Creatinine improved to 1.2.   Vaccinations: Continue revaccination schedule as per [facility]. Pulmonary nodule: Patient noted to have a 9 mm hypermetabolic pulmonary nodule in her left lower lobe lung.  Possibly infectious, continue to monitor closely. Left knee pain: Patient does not complain of this today.  Continue conservative management and injections as needed per orthopedics. Hand pain: Patient does not complain of this today.  Continue conservative management and injections as needed per orthopedics Neuropathy: Patient was previously given a referral to neurology.  She reports her appointment is in December 2024.  We discussed possibly increasing her gabapentin  dose, but patient does not wish to do this at this time. Rash: Likely related to Revlimid .  Continue dose reduction as above.  Resolved with prednisone .  Patient expressed understanding and was in agreement with this plan. She also understands that She can call clinic at any time with any questions, concerns, or complaints.      Cancer Staging  Lambda light chain myeloma (HCC) Staging form:  Plasma Cell Myeloma and Plasma Cell Disorders, AJCC 8th Edition - Clinical stage from 09/16/2020: RISS Stage I (Beta-2 -microglobulin (mg/L): 2.5, Albumin (g/dL): 4.9, ISS: Stage I, High-risk cytogenetics: Absent, LDH: Normal) - Signed by Jacobo Evalene PARAS, MD on 09/16/2020 Beta 2 microglobulin range (mg/L): Less than 3.5 Albumin range (g/dL): Greater than or equal to  3.5 Cytogenetics: 1p deletion  Evalene PARAS Jacobo, MD   03/07/2023 11:20 AM

## 2023-03-09 ENCOUNTER — Other Ambulatory Visit: Payer: Self-pay | Admitting: Oncology

## 2023-03-09 DIAGNOSIS — C9000 Multiple myeloma not having achieved remission: Secondary | ICD-10-CM

## 2023-03-16 NOTE — Progress Notes (Signed)
Patient consented and enrolled to [facility] on 03/16/23. Patient scheduled initial behavioral health evaluation for 03/21/23.

## 2023-03-19 ENCOUNTER — Other Ambulatory Visit: Payer: Self-pay | Admitting: Oncology

## 2023-03-19 DIAGNOSIS — C9000 Multiple myeloma not having achieved remission: Secondary | ICD-10-CM

## 2023-03-26 ENCOUNTER — Other Ambulatory Visit: Payer: Self-pay | Admitting: Oncology

## 2023-03-27 ENCOUNTER — Inpatient Hospital Stay: Payer: Medicare PPO | Admitting: Pharmacist

## 2023-03-27 ENCOUNTER — Inpatient Hospital Stay: Payer: Medicare PPO

## 2023-03-27 VITALS — BP 131/65 | HR 86 | Resp 18 | Wt 139.5 lb

## 2023-03-27 DIAGNOSIS — C9000 Multiple myeloma not having achieved remission: Secondary | ICD-10-CM | POA: Diagnosis not present

## 2023-03-27 DIAGNOSIS — E119 Type 2 diabetes mellitus without complications: Secondary | ICD-10-CM

## 2023-03-27 LAB — CMP (CANCER CENTER ONLY)
ALT: 28 U/L (ref 0–44)
AST: 28 U/L (ref 15–41)
Albumin: 4.2 g/dL (ref 3.5–5.0)
Alkaline Phosphatase: 66 U/L (ref 38–126)
Anion gap: 10 (ref 5–15)
BUN: 18 mg/dL (ref 8–23)
CO2: 23 mmol/L (ref 22–32)
Calcium: 9.2 mg/dL (ref 8.9–10.3)
Chloride: 107 mmol/L (ref 98–111)
Creatinine: 1.64 mg/dL — ABNORMAL HIGH (ref 0.44–1.00)
GFR, Estimated: 34 mL/min — ABNORMAL LOW (ref >60)
Glucose, Bld: 122 mg/dL — ABNORMAL HIGH (ref 70–99)
Potassium: 3.3 mmol/L — ABNORMAL LOW (ref 3.5–5.1)
Sodium: 140 mmol/L (ref 135–145)
Total Bilirubin: 0.5 mg/dL (ref 0.0–1.2)
Total Protein: 6.8 g/dL (ref 6.5–8.1)

## 2023-03-27 LAB — CBC WITH DIFFERENTIAL/PLATELET
Abs Immature Granulocytes: 0 10*3/uL (ref 0.00–0.07)
Basophils Absolute: 0 10*3/uL (ref 0.0–0.1)
Basophils Relative: 1 %
Eosinophils Absolute: 0.1 10*3/uL (ref 0.0–0.5)
Eosinophils Relative: 5 %
HCT: 37.4 % (ref 36.0–46.0)
Hemoglobin: 12 g/dL (ref 12.0–15.0)
Immature Granulocytes: 0 %
Lymphocytes Relative: 41 %
Lymphs Abs: 1.1 10*3/uL (ref 0.7–4.0)
MCH: 32.8 pg (ref 26.0–34.0)
MCHC: 32.1 g/dL (ref 30.0–36.0)
MCV: 102.2 fL — ABNORMAL HIGH (ref 80.0–100.0)
Monocytes Absolute: 0.3 10*3/uL (ref 0.1–1.0)
Monocytes Relative: 12 %
Neutro Abs: 1.1 10*3/uL — ABNORMAL LOW (ref 1.7–7.7)
Neutrophils Relative %: 41 %
Platelets: 112 10*3/uL — ABNORMAL LOW (ref 150–400)
RBC: 3.66 MIL/uL — ABNORMAL LOW (ref 3.87–5.11)
RDW: 13.7 % (ref 11.5–15.5)
WBC: 2.6 10*3/uL — ABNORMAL LOW (ref 4.0–10.5)
nRBC: 0 % (ref 0.0–0.2)

## 2023-03-27 LAB — SAMPLE TO BLOOD BANK

## 2023-03-27 MED ORDER — SODIUM CHLORIDE 0.9% FLUSH
10.0000 mL | Freq: Once | INTRAVENOUS | Status: AC
  Administered 2023-03-27: 10 mL via INTRAVENOUS
  Filled 2023-03-27: qty 10

## 2023-03-27 MED ORDER — HEPARIN SOD (PORK) LOCK FLUSH 100 UNIT/ML IV SOLN
500.0000 [IU] | Freq: Once | INTRAVENOUS | Status: AC
  Administered 2023-03-27: 500 [IU] via INTRAVENOUS
  Filled 2023-03-27: qty 5

## 2023-03-27 NOTE — Progress Notes (Signed)
Clinical Pharmacist Practitioner Clinic [facility]  [phone] [phone]  Patient Care Team: Jaclyn Shaggy, MD as PCP - General (Internal Medicine)   Name of the patient: Hannah Mcdonald  [id number]  1955/07/01   Date of visit: 03/27/23  HPI: Patient is a 68 y.o. female with lambda light chain myeloma s/p transplant. Maintenance lenalidomide was held due to pancytopenia, but with count improvement patient restarted lenalidomide in Nov 2024.   Reason for Consult: Oral chemotherapy follow-up for lenalidomide therapy.   PAST MEDICAL HISTORY: Past Medical History:  Diagnosis Date   Anxiety    Diabetes mellitus without complication (HCC) 2016   GERD (gastroesophageal reflux disease)    Hyperlipidemia    Myeloma (HCC)    Myeloma (HCC)    Personal history of colonic polyps    Squamous cell carcinoma of skin 11/25/2013   Left chest. WD SCC with superficial infiltration.   Tachycardia     HEMATOLOGY/ONCOLOGY HISTORY:  Oncology History  Lambda light chain myeloma (HCC)  08/24/2020 Initial Diagnosis   Lambda light chain myeloma (HCC)   09/16/2020 Cancer Staging   Staging form: Plasma Cell Myeloma and Plasma Cell Disorders, AJCC 8th Edition - Clinical stage from 09/16/2020: RISS Stage I (Beta-2-microglobulin (mg/L): 2.5, Albumin (g/dL): 4.9, ISS: Stage I, High-risk cytogenetics: Absent, LDH: Normal) - Signed by Jeralyn Ruths, MD on 09/16/2020 Beta 2 microglobulin range (mg/L): Less than 3.5 Albumin range (g/dL): Greater than or equal to 3.5 Cytogenetics: 1p deletion   11/02/2020 - 07/22/2021 Chemotherapy   Patient is on Treatment Plan : POST TRANSPLANT DaraVRd (Daratumumab SQ) q21d x 2 Cycles (Consolidation)- cycle 5&6     08/02/2021 - 09/27/2021 Chemotherapy   Patient is on Treatment Plan : MYELOMA Daratumumab SQ q28d- Maintainance treatment w/ revlimid     08/02/2021 - 08/15/2022 Chemotherapy   Patient is on Treatment Plan : MYELOMA  POST-TRANSPLANT MAINTENANE Daratumumab SQ + Lenalidomide q28d (Maintenance)       ALLERGIES:  has no known allergies.  MEDICATIONS:  Current Outpatient Medications  Medication Sig Dispense Refill   acetaminophen (TYLENOL) 325 MG tablet Take 650 mg by mouth 2 (two) times a week. Premed for velcade. Taking Tuesdays and Friday     acyclovir (ZOVIRAX) 400 MG tablet TAKE 1 TABLET BY MOUTH TWICE DAILY 60 tablet 5   albuterol (VENTOLIN HFA) 108 (90 Base) MCG/ACT inhaler Inhale 2 puffs into the lungs every 4 (four) hours as needed.     ALPRAZolam (XANAX) 0.5 MG tablet TAKE 1 TABLET BY MOUTH AT BEDTIME AS NEEDED FOR ANXIETY 30 tablet 0   ascorbic acid (VITAMIN C) 500 MG tablet Take 500 mg by mouth daily.     atorvastatin (LIPITOR) 20 MG tablet Take 20 mg by mouth daily.     busPIRone (BUSPAR) 30 MG tablet Take 30 mg by mouth 2 (two) times daily.     Calcium Carb-Cholecalciferol (OYSTER SHELL CALCIUM/VITAMIN D) 500-5 MG-MCG PACK Take by mouth.     CALCIUM-VITAMIN D PO Take by mouth 2 (two) times daily.     cyclobenzaprine (FLEXERIL) 10 MG tablet TAKE 1 TABLET BY MOUTH AT BEDTIME 30 tablet 2   dexamethasone (DECADRON) 4 MG tablet Take 4 mg by mouth daily. 5 tabs po q28 days (Patient not taking: Reported on 03/07/2023)     diphenhydrAMINE (BENADRYL) 25 mg capsule Take 25 mg by mouth daily.     diphenoxylate-atropine (LOMOTIL) 2.5-0.025 MG tablet Take 1-2 tablets by mouth every 8 (eight) hours as needed for  diarrhea or loose stools. 90 tablet 0   ferrous sulfate 325 (65 FE) MG tablet Take 325 mg by mouth daily with breakfast.     gabapentin (NEURONTIN) 300 MG capsule TAKE 1 CAPSULE BY MOUTH TWICE DAILY 60 capsule 2   ivabradine (CORLANOR) 5 MG TABS tablet Take 5 mg by mouth 2 (two) times daily with a meal.     lenalidomide (REVLIMID) 5 MG capsule TAKE 1 CAPSULE BY MOUTH 1 TIME A DAY FOR 21 DAYS ON THEN 7 DAYS OFF 21 capsule 0   lidocaine-prilocaine (EMLA) cream APPLY 1 APPLICATION TOPICALLY AS NEEDED 30 g  2   liraglutide (VICTOZA) 18 MG/3ML SOPN Inject 1.2 mg into the skin daily.     Magnesium 400 MG TABS Take 1 tablet by mouth daily.     meloxicam (MOBIC) 15 MG tablet Take 15 mg by mouth daily.     methocarbamol (ROBAXIN) 500 MG tablet Take 1 tablet (500 mg total) by mouth every 6 (six) hours as needed for muscle spasms. (Patient not taking: Reported on 10/10/2022) 40 tablet 1   Multiple Vitamin (MULTI-VITAMIN) tablet Take 1 tablet by mouth daily.     omeprazole (PRILOSEC) 40 MG capsule Take 40 mg by mouth daily.     ondansetron (ZOFRAN) 8 MG tablet Take 1 tablet (8 mg total) by mouth 2 (two) times daily as needed (Nausea or vomiting). 60 tablet 2   oxyCODONE-acetaminophen (PERCOCET/ROXICET) 5-325 MG tablet TAKE 1-2 TABLETS BY MOUTH EVERY 4 HOURS AS NEEDED FOR SEVERE PAIN 90 tablet 0   predniSONE (DELTASONE) 10 MG tablet Take 1 tablet (10 mg total) by mouth daily. Take with food. Take every 28 days as needed for Revlimid rash. 3 tablet 3   prochlorperazine (COMPAZINE) 10 MG tablet TAKE 1 TABLET BY MOUTH EVERY 6 HOURS AS NEEDED FOR NAUSEA OR VOMITING 60 tablet 2   traMADol (ULTRAM) 50 MG tablet TAKE 1 TABLET BY MOUTH EVERY 6 HOURS AS NEEDED FOR PAIN 60 tablet 0   No current facility-administered medications for this visit.   Facility-Administered Medications Ordered in Other Visits  Medication Dose Route Frequency Provider Last Rate Last Admin   0.9 %  sodium chloride infusion   Intravenous Continuous Jeralyn Ruths, MD   Stopped at 04/07/21 1142   heparin lock flush 100 unit/mL  500 Units Intravenous Once Jeralyn Ruths, MD       heparin lock flush 100 unit/mL  500 Units Intravenous Once Jeralyn Ruths, MD       sodium chloride flush (NS) 0.9 % injection 10 mL  10 mL Intravenous PRN Jeralyn Ruths, MD   10 mL at 11/23/20 0932   sodium chloride flush (NS) 0.9 % injection 10 mL  10 mL Intravenous PRN Jeralyn Ruths, MD   10 mL at 04/18/22 1417   sodium chloride flush (NS)  0.9 % injection 10 mL  10 mL Intravenous Once Jeralyn Ruths, MD       sodium chloride flush (NS) 0.9 % injection 10 mL  10 mL Intravenous Once Jeralyn Ruths, MD        VITAL SIGNS: There were no vitals taken for this visit. There were no vitals filed for this visit.  Estimated body mass index is 25.94 kg/m as calculated from the following:   Height as of 01/01/23: 5\' 2"  (1.575 m).   Weight as of 03/07/23: 64.3 kg (141 lb 12.8 oz).  LABS: CBC:    Component Value Date/Time  WBC 2.8 (L) 01/30/2023 1455   HGB 11.6 (L) 01/30/2023 1455   HGB 9.1 (L) 11/06/2022 1255   HCT 35.6 (L) 01/30/2023 1455   PLT 108 (L) 01/30/2023 1455   PLT 75 (L) 11/06/2022 1255   MCV 105.0 (H) 01/30/2023 1455   NEUTROABS 1.4 (L) 01/30/2023 1455   LYMPHSABS 0.8 01/30/2023 1455   MONOABS 0.4 01/30/2023 1455   EOSABS 0.1 01/30/2023 1455   BASOSABS 0.0 01/30/2023 1455   Comprehensive Metabolic Panel:    Component Value Date/Time   NA 137 01/30/2023 1455   K 3.4 (L) 01/30/2023 1455   CL 103 01/30/2023 1455   CO2 23 01/30/2023 1455   BUN 21 01/30/2023 1455   CREATININE 1.42 (H) 01/30/2023 1455   GLUCOSE 165 (H) 01/30/2023 1455   CALCIUM 8.6 (L) 01/30/2023 1455   AST 26 01/30/2023 1455   ALT 29 01/30/2023 1455   ALKPHOS 88 01/30/2023 1455   BILITOT 0.5 01/30/2023 1455   PROT 6.3 (L) 01/30/2023 1455   ALBUMIN 3.9 01/30/2023 1455     Present during today's visit: patient only  Assessment and Plan: CMP/CBC reviewed, patient to continue lenalidomide 5mg  21 on/7 off Patient is currently in the middle of her off week With platelet count improvement, could consider increasing lenalidomide to 10mg  in the future. (Patient reports having a full bottle of 10mg  capsules on hand.) MD would like to wait until counts returned to normal and remain there for a bit   Oral Chemotherapy Side Effect/Intolerance:  Rash: patient has prednisone prn prescription to has a needed for rash with cycle start   Patient continues to take diphenhydramine daily Per patient [facility] offered her the option of lenalidomide desensitizing program, she may consider this in the future No other side effects noted   Oral Chemotherapy Adherence: No missed doses reported No patient barriers to medication adherence identified.   New medications: per patient she was switched from Victoza to [facility] for insurance reasons, med list updated  Medication Access Issues: No issues, patient fills medication at [facility]  Patient expressed understanding and was in agreement with this plan. She also understands that She can call clinic at any time with any questions, concerns, or complaints.   Follow-up plan: RTC in 4 weeks  Thank you for allowing me to participate in the care of this very pleasant patient.   Time Total: 15 mins  Visit consisted of counseling and education on dealing with issues of symptom management in the setting of serious and potentially life-threatening illness.Greater than 50%  of this time was spent counseling and coordinating care related to the above assessment and plan.  Signed by: Remi Haggard, PharmD, BCPS, Nolon Bussing, CPP Hematology/Oncology Clinical Pharmacist Practitioner [facility] [phone]  03/27/2023 1:32 PM

## 2023-03-28 ENCOUNTER — Other Ambulatory Visit: Payer: Self-pay | Admitting: Oncology

## 2023-03-28 ENCOUNTER — Encounter: Payer: Self-pay | Admitting: *Deleted

## 2023-03-28 DIAGNOSIS — C9000 Multiple myeloma not having achieved remission: Secondary | ICD-10-CM | POA: Diagnosis not present

## 2023-03-28 DIAGNOSIS — F4323 Adjustment disorder with mixed anxiety and depressed mood: Secondary | ICD-10-CM | POA: Diagnosis not present

## 2023-03-28 LAB — KAPPA/LAMBDA LIGHT CHAINS
Kappa free light chain: 22.9 mg/L — ABNORMAL HIGH (ref 3.3–19.4)
Kappa, lambda light chain ratio: 1.37 (ref 0.26–1.65)
Lambda free light chains: 16.7 mg/L (ref 5.7–26.3)

## 2023-03-28 MED ORDER — TRAZODONE HCL 50 MG PO TABS: 50.0000 mg | ORAL_TABLET | Freq: Every day | ORAL | 1 refills | Status: DC

## 2023-03-28 NOTE — Progress Notes (Signed)
[facility] Care - Final Intake Summary   Hannah Mcdonald (25-Sep-1955) is a 68 y/o white female with stage I lambda light chain myeloma since 2022 (dx Jun, remission Nov), referred by Dr. Orlie Dakin @ [facility]. Hannah Mcdonald is also dx'd with type 2 diabetes. Hannah Mcdonald is currently taking buspirone & alprazolam for anxiety/sleep and is open to medication recommendations.   Assessments:  Hannah Mcdonald scored PHQ-9=6 (mild), reporting trouble falling asleep caused by racing thoughts, feeling down d/t managing numerous medical issues, and fatigue. Hannah Mcdonald scored GAD-7=5 (mild), reporting anxiety around "what the future holds" in relation to her medical issues, trouble relaxing physically (jaw-clenching & tense shoulders), and racing thoughts especially before bed despite attempts at deep breathing/mental imagery.   Psych Tx/Hx: Hannah Mcdonald has taken buspirone for 25-30 years (current sig 30mg  BID) for anxiety, and was prescribed alprazolam in 2023 for anxiety-related trouble sleeping. She still takes this at 0.5mg  qHS but doesn't find it sufficient and has lost efficacy over time. She also takes gabapentin 300mg  BID for neuropathy.   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 They share 3 sons and 3 grandsons. Hannah Mcdonald enjoys reading, traveling, and activities with her church.  Assigned Dx: O96.29: Adjustment disorder with mixed anxiety and depressed mood  --  Hannah Mcdonald [facility] Manager

## 2023-03-28 NOTE — Progress Notes (Signed)
[facility] Medication Recommendation  Dear Referring Specialist:  Thank you for referring your patient to [facility]. Below you will find my new psychiatric medication recommendations.  Once reviewed, please confirm prescription(s) within 2 business days. If you modify or decline the recommendations, please document these modifications and notify us. If you have any questions or concerns, please call our [facility] provider line at [phone].  Best,   Demetria Pore, MD  Name: Chiana, Wamser Date of Birth: April 30, 1955 Referring Provider: Gerarda Fraction, MD Patient MRN: [id number]  Most recent Assessments: Assessment Date Score Depression (PHQ-9) 03/21/2023 6 Anxiety (GAD-7) 03/21/2023 5  Medication Recommendations: Psychiatric Provider recommends that the Referring Specialist prescribe the below medications....  1. Medication (name, dose, instructions) Start trazodone 50mg  nightly Reason for Recommendation: insomnia   2. Medication (name, dose, instructions) Taper alprazolam (see attached taper schedule) Reason for Recommendation: it has become ineffective for sleep, can be habit forming, and it not intended for long term use See taper plan below:  --  Marissa Nestle [facility] Manager

## 2023-04-04 ENCOUNTER — Other Ambulatory Visit: Payer: Self-pay | Admitting: Pharmacist

## 2023-04-04 DIAGNOSIS — R21 Rash and other nonspecific skin eruption: Secondary | ICD-10-CM

## 2023-04-04 NOTE — Telephone Encounter (Signed)
 Refills too soon, rx not needed until

## 2023-04-09 ENCOUNTER — Encounter: Payer: Self-pay | Admitting: Oncology

## 2023-04-12 ENCOUNTER — Inpatient Hospital Stay: Payer: Medicare PPO | Attending: Oncology

## 2023-04-12 DIAGNOSIS — C9000 Multiple myeloma not having achieved remission: Secondary | ICD-10-CM

## 2023-04-12 DIAGNOSIS — C9001 Multiple myeloma in remission: Secondary | ICD-10-CM | POA: Insufficient documentation

## 2023-04-12 DIAGNOSIS — D693 Immune thrombocytopenic purpura: Secondary | ICD-10-CM | POA: Diagnosis present

## 2023-04-12 LAB — CBC WITH DIFFERENTIAL/PLATELET
Abs Immature Granulocytes: 0.01 10*3/uL (ref 0.00–0.07)
Basophils Absolute: 0 10*3/uL (ref 0.0–0.1)
Basophils Relative: 1 %
Eosinophils Absolute: 0.2 10*3/uL (ref 0.0–0.5)
Eosinophils Relative: 6 %
HCT: 38.6 % (ref 36.0–46.0)
Hemoglobin: 12.2 g/dL (ref 12.0–15.0)
Immature Granulocytes: 0 %
Lymphocytes Relative: 26 %
Lymphs Abs: 0.9 10*3/uL (ref 0.7–4.0)
MCH: 32.6 pg (ref 26.0–34.0)
MCHC: 31.6 g/dL (ref 30.0–36.0)
MCV: 103.2 fL — ABNORMAL HIGH (ref 80.0–100.0)
Monocytes Absolute: 0.5 10*3/uL (ref 0.1–1.0)
Monocytes Relative: 12 %
Neutro Abs: 2 10*3/uL (ref 1.7–7.7)
Neutrophils Relative %: 55 %
Platelets: 74 10*3/uL — ABNORMAL LOW (ref 150–400)
RBC: 3.74 MIL/uL — ABNORMAL LOW (ref 3.87–5.11)
RDW: 13.7 % (ref 11.5–15.5)
WBC: 3.6 10*3/uL — ABNORMAL LOW (ref 4.0–10.5)
nRBC: 0 % (ref 0.0–0.2)

## 2023-04-12 LAB — CMP (CANCER CENTER ONLY)
ALT: 25 U/L (ref 0–44)
AST: 21 U/L (ref 15–41)
Albumin: 4.3 g/dL (ref 3.5–5.0)
Alkaline Phosphatase: 57 U/L (ref 38–126)
Anion gap: 7 (ref 5–15)
BUN: 18 mg/dL (ref 8–23)
CO2: 25 mmol/L (ref 22–32)
Calcium: 9.1 mg/dL (ref 8.9–10.3)
Chloride: 107 mmol/L (ref 98–111)
Creatinine: 1.57 mg/dL — ABNORMAL HIGH (ref 0.44–1.00)
GFR, Estimated: 36 mL/min — ABNORMAL LOW (ref >60)
Glucose, Bld: 102 mg/dL — ABNORMAL HIGH (ref 70–99)
Potassium: 3.8 mmol/L (ref 3.5–5.1)
Sodium: 139 mmol/L (ref 135–145)
Total Bilirubin: 0.5 mg/dL (ref 0.0–1.2)
Total Protein: 6.7 g/dL (ref 6.5–8.1)

## 2023-04-12 LAB — SAMPLE TO BLOOD BANK

## 2023-04-13 LAB — KAPPA/LAMBDA LIGHT CHAINS
Kappa free light chain: 22 mg/L — ABNORMAL HIGH (ref 3.3–19.4)
Kappa, lambda light chain ratio: 1.45 (ref 0.26–1.65)
Lambda free light chains: 15.2 mg/L (ref 5.7–26.3)

## 2023-04-20 ENCOUNTER — Other Ambulatory Visit: Payer: Self-pay | Admitting: Oncology

## 2023-04-20 ENCOUNTER — Other Ambulatory Visit: Payer: Self-pay | Admitting: *Deleted

## 2023-04-20 DIAGNOSIS — C9000 Multiple myeloma not having achieved remission: Secondary | ICD-10-CM

## 2023-04-20 MED ORDER — ALPRAZOLAM 0.5 MG PO TABS: 0.5000 mg | ORAL_TABLET | Freq: Every evening | ORAL | 0 refills | Status: DC | PRN

## 2023-04-25 ENCOUNTER — Other Ambulatory Visit: Payer: Self-pay | Admitting: Oncology

## 2023-04-25 DIAGNOSIS — F4323 Adjustment disorder with mixed anxiety and depressed mood: Secondary | ICD-10-CM | POA: Insufficient documentation

## 2023-04-26 ENCOUNTER — Encounter: Payer: Self-pay | Admitting: Oncology

## 2023-04-26 MED ORDER — ALPRAZOLAM 0.5 MG PO TABS: 0.5000 mg | ORAL_TABLET | Freq: Every evening | ORAL | 0 refills | Status: AC | PRN

## 2023-04-27 ENCOUNTER — Other Ambulatory Visit: Payer: Self-pay | Admitting: *Deleted

## 2023-04-27 DIAGNOSIS — C9000 Multiple myeloma not having achieved remission: Secondary | ICD-10-CM | POA: Diagnosis not present

## 2023-04-27 DIAGNOSIS — F4323 Adjustment disorder with mixed anxiety and depressed mood: Secondary | ICD-10-CM | POA: Diagnosis not present

## 2023-04-30 ENCOUNTER — Encounter: Payer: Self-pay | Admitting: Oncology

## 2023-04-30 ENCOUNTER — Inpatient Hospital Stay: Payer: Medicare PPO | Attending: Oncology

## 2023-04-30 ENCOUNTER — Inpatient Hospital Stay: Payer: Medicare PPO

## 2023-04-30 ENCOUNTER — Inpatient Hospital Stay: Payer: Medicare PPO | Admitting: Oncology

## 2023-04-30 VITALS — BP 115/70 | HR 78 | Temp 98.2°F | Resp 16 | Ht 62.0 in | Wt 129.7 lb

## 2023-04-30 DIAGNOSIS — C9001 Multiple myeloma in remission: Secondary | ICD-10-CM | POA: Insufficient documentation

## 2023-04-30 DIAGNOSIS — C9000 Multiple myeloma not having achieved remission: Secondary | ICD-10-CM

## 2023-04-30 LAB — CBC WITH DIFFERENTIAL/PLATELET
Abs Immature Granulocytes: 0.01 10*3/uL (ref 0.00–0.07)
Basophils Absolute: 0 10*3/uL (ref 0.0–0.1)
Basophils Relative: 1 %
Eosinophils Absolute: 0 10*3/uL (ref 0.0–0.5)
Eosinophils Relative: 1 %
HCT: 36.8 % (ref 36.0–46.0)
Hemoglobin: 12.3 g/dL (ref 12.0–15.0)
Immature Granulocytes: 0 %
Lymphocytes Relative: 29 %
Lymphs Abs: 1.2 10*3/uL (ref 0.7–4.0)
MCH: 33.1 pg (ref 26.0–34.0)
MCHC: 33.4 g/dL (ref 30.0–36.0)
MCV: 98.9 fL (ref 80.0–100.0)
Monocytes Absolute: 0.6 10*3/uL (ref 0.1–1.0)
Monocytes Relative: 15 %
Neutro Abs: 2.1 10*3/uL (ref 1.7–7.7)
Neutrophils Relative %: 54 %
Platelets: 126 10*3/uL — ABNORMAL LOW (ref 150–400)
RBC: 3.72 MIL/uL — ABNORMAL LOW (ref 3.87–5.11)
RDW: 13.7 % (ref 11.5–15.5)
WBC: 3.9 10*3/uL — ABNORMAL LOW (ref 4.0–10.5)
nRBC: 0 % (ref 0.0–0.2)

## 2023-04-30 LAB — CMP (CANCER CENTER ONLY)
ALT: 21 U/L (ref 0–44)
AST: 25 U/L (ref 15–41)
Albumin: 4.2 g/dL (ref 3.5–5.0)
Alkaline Phosphatase: 63 U/L (ref 38–126)
Anion gap: 11 (ref 5–15)
BUN: 19 mg/dL (ref 8–23)
CO2: 23 mmol/L (ref 22–32)
Calcium: 9 mg/dL (ref 8.9–10.3)
Chloride: 103 mmol/L (ref 98–111)
Creatinine: 1.3 mg/dL — ABNORMAL HIGH (ref 0.44–1.00)
GFR, Estimated: 45 mL/min — ABNORMAL LOW (ref >60)
Glucose, Bld: 104 mg/dL — ABNORMAL HIGH (ref 70–99)
Potassium: 3.3 mmol/L — ABNORMAL LOW (ref 3.5–5.1)
Sodium: 137 mmol/L (ref 135–145)
Total Bilirubin: 0.5 mg/dL (ref 0.0–1.2)
Total Protein: 6.8 g/dL (ref 6.5–8.1)

## 2023-04-30 MED ORDER — SODIUM CHLORIDE 0.9 % IV SOLN
Freq: Once | INTRAVENOUS | Status: AC
  Filled 2023-04-30: qty 250

## 2023-04-30 MED ORDER — ZOLEDRONIC ACID 4 MG/100ML IV SOLN
4.0000 mg | Freq: Once | INTRAVENOUS | Status: AC
  Administered 2023-04-30: 4 mg via INTRAVENOUS
  Filled 2023-04-30: qty 100

## 2023-04-30 NOTE — Progress Notes (Signed)
 [facility]  [phone] [phone]  ID: Orvis Brill OB: Jun 19, 1955  MR#: [id number]  ION#:[id number]  Patient Care Team: Jaclyn Shaggy, MD as PCP - General (Internal Medicine)   CHIEF COMPLAINT: Lambda light chain myeloma with 1p del, in remission.  ITP.  INTERVAL HISTORY: Patient returns to clinic today for repeat laboratory, further evaluation, and continuation of Zometa.  She continues to have back pain, but otherwise feels well.  She is tolerating Revlimid without significant side effects.  She initiated treatment yesterday along with prednisone and has not developed a rash.  She does not complain of any joint pain today. Her peripheral neuropathy is unchanged. She has no other neurologic complaints.  She denies any recent fevers or illnesses.  She denies any chest pain, shortness of breath, cough, or hemoptysis.  She denies any nausea, vomiting, constipation, or diarrhea.  She has no urinary complaints.  Patient offers no further specific complaints today.  REVIEW OF SYSTEMS:   Review of Systems  Constitutional: Negative.  Negative for fever, malaise/fatigue and weight loss.  HENT: Negative.  Negative for congestion and sinus pain.   Respiratory: Negative.  Negative for cough, hemoptysis and shortness of breath.   Cardiovascular:  Negative for chest pain and leg swelling.  Gastrointestinal: Negative.  Negative for abdominal pain, diarrhea and nausea.  Genitourinary: Negative.  Negative for dysuria.  Musculoskeletal:  Positive for back pain. Negative for joint pain and neck pain.  Skin: Negative.  Negative for rash.  Neurological:  Positive for sensory change. Negative for dizziness, tremors, focal weakness, weakness and headaches.  Endo/Heme/Allergies: Negative.  Does not bruise/bleed easily.  Psychiatric/Behavioral: Negative.  The patient is not nervous/anxious and does not have insomnia.     As per HPI. Otherwise, a complete review of  systems is negative.  PAST MEDICAL HISTORY: Past Medical History:  Diagnosis Date   Anxiety    Diabetes mellitus without complication (HCC) 2016   GERD (gastroesophageal reflux disease)    Hyperlipidemia    Myeloma (HCC)    Myeloma (HCC)    Personal history of colonic polyps    Squamous cell carcinoma of skin 11/25/2013   Left chest. WD SCC with superficial infiltration.   Tachycardia     PAST SURGICAL HISTORY: Past Surgical History:  Procedure Laterality Date   AUGMENTATION MAMMAPLASTY Bilateral 1983   silicone and replacement in 2001   BREAST ENHANCEMENT SURGERY  1983   CESAREAN SECTION  1987, 1989   COLONOSCOPY  1988, 1998,2003, 2008, 2013   COLONOSCOPY WITH PROPOFOL N/A 08/16/2016   Procedure: COLONOSCOPY WITH PROPOFOL;  Surgeon: Earline Mayotte, MD;  Location: [facility] ENDOSCOPY;  Service: Endoscopy;  Laterality: N/A;   COLONOSCOPY WITH PROPOFOL N/A 10/05/2021   Procedure: COLONOSCOPY WITH PROPOFOL;  Surgeon: Earline Mayotte, MD;  Location: [facility] ENDOSCOPY;  Service: Endoscopy;  Laterality: N/A;  HAS A PORT, NEEDS AMPICILLIN PER OFFICE   DILATION AND CURETTAGE OF UTERUS     ENDOMETRIAL ABLATION  12/1991   ganglion cyst removal      IR BONE MARROW BIOPSY & ASPIRATION  08/08/2022   IR IMAGING GUIDED PORT INSERTION  10/27/2020   IR IMAGING GUIDED PORT INSERTION  04/04/2021   KYPHOPLASTY N/A 10/28/2020   Procedure: T12 and L3 KYPHOPLASTY;  Surgeon: Kennedy Bucker, MD;  Location: [facility] ORS;  Service: Orthopedics;  Laterality: N/A;   TONSILLECTOMY     WRIST SURGERY Right 05/1995    FAMILY HISTORY: Family History  Problem Relation Age of Onset  Colon polyps Sister    Colon cancer Father 25   Diabetes Mother    Breast cancer Neg Hx     ADVANCED DIRECTIVES (Y/N):  N  HEALTH MAINTENANCE: Social History   Tobacco Use   Smoking status: Former    Current packs/day: 0.00    Average packs/day: 1 pack/day for 4.0 years (4.0 ttl pk-yrs)    Types: Cigarettes    Start date: 45     Quit date: 1982    Years since quitting: 43.1   Smokeless tobacco: Never  Vaping Use   Vaping status: Never Used  Substance Use Topics   Alcohol use: Yes    Alcohol/week: 1.0 - 2.0 standard drink of alcohol    Types: 1 - 2 Glasses of wine per week    Comment: occassionally   Drug use: No     Colonoscopy:  PAP:  Bone density:  Lipid panel:  No Known Allergies  Current Outpatient Medications  Medication Sig Dispense Refill   acetaminophen (TYLENOL) 325 MG tablet Take 650 mg by mouth 2 (two) times a week. Premed for velcade. Taking Tuesdays and Friday     acyclovir (ZOVIRAX) 400 MG tablet TAKE 1 TABLET BY MOUTH TWICE DAILY 60 tablet 5   albuterol (VENTOLIN HFA) 108 (90 Base) MCG/ACT inhaler Inhale 2 puffs into the lungs every 4 (four) hours as needed.     ALPRAZolam (XANAX) 0.5 MG tablet Take 1 tablet (0.5 mg total) by mouth at bedtime as needed for anxiety. TAKE 1 TABLET BY MOUTH AT BEDTIME AS NEEDED FOR ANXIETY 30 tablet 0   ascorbic acid (VITAMIN C) 500 MG tablet Take 500 mg by mouth daily.     atorvastatin (LIPITOR) 20 MG tablet Take 20 mg by mouth daily.     busPIRone (BUSPAR) 30 MG tablet Take 30 mg by mouth 2 (two) times daily.     Calcium Carb-Cholecalciferol (OYSTER SHELL CALCIUM/VITAMIN D) 500-5 MG-MCG PACK Take by mouth.     CALCIUM-VITAMIN D PO Take by mouth 2 (two) times daily.     cyclobenzaprine (FLEXERIL) 10 MG tablet TAKE 1 TABLET BY MOUTH AT BEDTIME 30 tablet 2   diphenhydrAMINE (BENADRYL) 25 mg capsule Take 25 mg by mouth daily.     diphenoxylate-atropine (LOMOTIL) 2.5-0.025 MG tablet Take 1-2 tablets by mouth every 8 (eight) hours as needed for diarrhea or loose stools. 90 tablet 0   ferrous sulfate 325 (65 FE) MG tablet Take 325 mg by mouth daily with breakfast.     gabapentin (NEURONTIN) 300 MG capsule TAKE 1 CAPSULE BY MOUTH TWICE DAILY 60 capsule 2   ivabradine (CORLANOR) 5 MG TABS tablet Take 5 mg by mouth 2 (two) times daily with a meal.      lenalidomide (REVLIMID) 5 MG capsule TAKE 1 CAPSULE BY MOUTH 1 TIME A DAY FOR 21 DAYS ON THEN 7 DAYS OFF 21 capsule 0   lidocaine-prilocaine (EMLA) cream APPLY 1 APPLICATION TOPICALLY AS NEEDED 30 g 2   Magnesium 400 MG TABS Take 1 tablet by mouth daily.     meloxicam (MOBIC) 15 MG tablet Take 15 mg by mouth daily.     MOUNJARO 7.5 MG/0.5ML Pen Inject 7.5 mg into the skin once a week.     Multiple Vitamin (MULTI-VITAMIN) tablet Take 1 tablet by mouth daily.     omeprazole (PRILOSEC) 40 MG capsule Take 40 mg by mouth daily.     ondansetron (ZOFRAN) 8 MG tablet Take 1 tablet (8 mg total) by mouth  2 (two) times daily as needed (Nausea or vomiting). 60 tablet 2   oxyCODONE-acetaminophen (PERCOCET/ROXICET) 5-325 MG tablet TAKE 1-2 TABLETS BY MOUTH EVERY 4 HOURS AS NEEDED FOR SEVERE PAIN 90 tablet 0   predniSONE (DELTASONE) 10 MG tablet Take 1 tablet (10 mg total) by mouth daily. Take with food. Take every 28 days as needed for Revlimid rash. 3 tablet 3   prochlorperazine (COMPAZINE) 10 MG tablet TAKE 1 TABLET BY MOUTH EVERY 6 HOURS AS NEEDED FOR NAUSEA OR VOMITING 60 tablet 2   traMADol (ULTRAM) 50 MG tablet TAKE 1 TABLET BY MOUTH EVERY 6 HOURS AS NEEDED FOR PAIN 60 tablet 1   traZODone (DESYREL) 50 MG tablet TAKE 1 TABLET BY MOUTH AT BEDTIME 30 tablet 1   dexamethasone (DECADRON) 4 MG tablet Take 4 mg by mouth daily. 5 tabs po q28 days (Patient not taking: Reported on 10/10/2022)     methocarbamol (ROBAXIN) 500 MG tablet Take 1 tablet (500 mg total) by mouth every 6 (six) hours as needed for muscle spasms. (Patient not taking: Reported on 10/10/2022) 40 tablet 1   No current facility-administered medications for this visit.   Facility-Administered Medications Ordered in Other Visits  Medication Dose Route Frequency Provider Last Rate Last Admin   0.9 %  sodium chloride infusion   Intravenous Continuous Jeralyn Ruths, MD   Stopped at 04/07/21 1142   heparin lock flush 100 unit/mL  500 Units  Intravenous Once Jeralyn Ruths, MD       sodium chloride flush (NS) 0.9 % injection 10 mL  10 mL Intravenous PRN Jeralyn Ruths, MD   10 mL at 11/23/20 0932   sodium chloride flush (NS) 0.9 % injection 10 mL  10 mL Intravenous PRN Jeralyn Ruths, MD   10 mL at 04/18/22 1417   sodium chloride flush (NS) 0.9 % injection 10 mL  10 mL Intravenous Once Jeralyn Ruths, MD        OBJECTIVE: Vitals:   04/30/23 1415  BP: 115/70  Pulse: 78  Resp: 16  Temp: 98.2 F (36.8 C)  SpO2: 100%       Body mass index is 23.72 kg/m.    ECOG FS:0 - Asymptomatic  General: Well-developed, well-nourished, no acute distress. Eyes: Pink conjunctiva, anicteric sclera. HEENT: Normocephalic, moist mucous membranes. Lungs: No audible wheezing or coughing. Heart: Regular rate and rhythm. Abdomen: Soft, nontender, no obvious distention. Musculoskeletal: No edema, cyanosis, or clubbing. Neuro: Alert, answering all questions appropriately. Cranial nerves grossly intact. Skin: No rashes or petechiae noted. Psych: Normal affect.  LAB RESULTS:  Lab Results  Component Value Date   NA 137 04/30/2023   K 3.3 (L) 04/30/2023   CL 103 04/30/2023   CO2 23 04/30/2023   GLUCOSE 104 (H) 04/30/2023   BUN 19 04/30/2023   CREATININE 1.30 (H) 04/30/2023   CALCIUM 9.0 04/30/2023   PROT 6.8 04/30/2023   ALBUMIN 4.2 04/30/2023   AST 25 04/30/2023   ALT 21 04/30/2023   ALKPHOS 63 04/30/2023   BILITOT 0.5 04/30/2023   GFRNONAA 45 (L) 04/30/2023    Lab Results  Component Value Date   WBC 3.9 (L) 04/30/2023   NEUTROABS 2.1 04/30/2023   HGB 12.3 04/30/2023   HCT 36.8 04/30/2023   MCV 98.9 04/30/2023   PLT 126 (L) 04/30/2023     STUDIES: No results found.  ONCOLOGY HISTORY:  Diagnosis confirmed from bone biopsy on August 17, 2020.  Initially her lambda free light chains were  elevated at 432.1, but now are within normal limits at 12.1.  Immunoglobulins and SPEP are normal.  Bone marrow biopsy  completed on August 26, 2020 revealed 25% plasma cells along with the deletion of 1p which is considered poor prognosis.  PET scan results from September 06, 2020 reviewed independently with 3 hypermetabolic lesions in right C5-6 facets, right iliac crest lesion, and L3 vertebral lesion.  After lengthy discussion with the patient, she agreed to pursue chemotherapy with daratumumab, Velcade, Revlimid, and dexamethasone followed by autologous bone marrow transplant.  Patient received weekly daratumumab for 4 cycles along with Velcade on days 1, 4, 8, and 11.  She also received Revlimid on days 1 through 14 of a 21-day cycle.  She completed cycle 4 of treatment on January 14, 2021.  Subsequent bone marrow biopsy on January 24, 2021 revealed complete remission with no evidence of myeloma.  Patient underwent autologous stem cell transplant at [facility] on February 24, 2021.  She was readmitted to the hospital on March 04, 2021 with neutropenic fever, diarrhea, and sepsis-like symptoms.  Infectious disease work-up was negative.   Patient previously received consolidation treatment with the GRIFFIN regimen.  She received consolidation with cycle 5 and 6 using daratumumab on day 1, Velcade on day 1, 4, 8, and 11, Revlimid 10 mg on days 1 through 14 with 7 days off, and weekly dexamethasone on a 21-day cycle.  She was on maintenance daratumumab on day 1 and Revlimid 10 mg on days 1 through 21 on a 28-day cycle for cycles 7 through 32.  Her most recent PET scan on July 31, 2022 reviewed independently and report as above with no obvious evidence of recurrent disease.  A second bone marrow biopsy was completed on August 08, 2022 which also revealed no evidence of disease with normal cytogenetics.    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  PLAN:      Lambda light chain myeloma with 1p del:  Given her persistent pancytopenia, maintenance Revlimid was discontinued in approximately July  2024 and then reinitiated in November 2024.  Her most recent bone marrow biopsy at [facility] on November 07, 2022 reported complete remission.  Laboratory work at [facility] on March 05, 2023 reviewed continued complete remission.  Continue dose reduced to 5 mg Revlimid daily for 21 days with 7 days off.  Will not increase dose of Revlimid at this time.  Return to clinic in 4 weeks for laboratory work, evaluation by clinical pharmacy, and continuation of Zometa.  Patient will then return to clinic in 8 weeks for further evaluation and continuation of treatment.  Appreciate clinical pharmacy input. Thrombocytopenia: Patient platelet count 126 today.  Continue low-dose Revlimid as above.  Anemia: Resolved. Leukopenia: Mild, monitor.   Hypocalcemia: Resolved. Renal insufficiency: Chronic and unchanged.  Patient's creatinine is 1.30. Vaccinations: Continue revaccination schedule as per [facility]. Pulmonary nodule: Patient noted to have a 9 mm hypermetabolic pulmonary nodule in her left lower lobe lung.  Possibly infectious, continue to monitor closely. Left knee pain: Patient does not complain of this today.  Continue conservative management and injections as needed per orthopedics. Hand pain: Patient does not complain of this today.  Continue conservative management and injections as needed per orthopedics Neuropathy: Chronic and unchanged.  Patient reports her neurology appointment is later this week.  We discussed possibly increasing her gabapentin dose, but patient does not wish to do this at this time. Rash: Possibly related  to Revlimid.  Continue dose reduction as above.  Resolved with prednisone.  Patient expressed understanding and was in agreement with this plan. She also understands that She can call clinic at any time with any questions, concerns, or complaints.      Cancer Staging  Lambda light chain myeloma (HCC) Staging form: Plasma Cell Myeloma and Plasma Cell  Disorders, AJCC 8th Edition - Clinical stage from 09/16/2020: RISS Stage I (Beta-2-microglobulin (mg/L): 2.5, Albumin (g/dL): 4.9, ISS: Stage I, High-risk cytogenetics: Absent, LDH: Normal) - Signed by Jeralyn Ruths, MD on 09/16/2020 Beta 2 microglobulin range (mg/L): Less than 3.5 Albumin range (g/dL): Greater than or equal to 3.5 Cytogenetics: 1p deletion  Jeralyn Ruths, MD   04/30/2023 4:30 PM

## 2023-05-01 LAB — KAPPA/LAMBDA LIGHT CHAINS
Kappa free light chain: 16.3 mg/L (ref 3.3–19.4)
Kappa, lambda light chain ratio: 1.43 (ref 0.26–1.65)
Lambda free light chains: 11.4 mg/L (ref 5.7–26.3)

## 2023-05-14 ENCOUNTER — Other Ambulatory Visit: Payer: Self-pay | Admitting: Oncology

## 2023-05-14 DIAGNOSIS — C9000 Multiple myeloma not having achieved remission: Secondary | ICD-10-CM

## 2023-05-15 NOTE — Progress Notes (Signed)
 [facility] - Medication Recommendation / Update  Dear Referring Specialist:   Thank you for referring your patient to [facility]. Below you will find my new psychiatric medication recommendations.  [name] continues to taper off alprazolam. However, I have recommended she repeat weeks 7-8 of the taper we provided given some increased anxiety recently. There is nothing for you to do. This is just so you are aware.  Once reviewed, please confirm prescription(s) within 2 business days. If you modify or decline the recommendations, please document these modifications and notify us. If you have any questions or concerns, please call our [facility] provider line at [phone].  Best,   Demetria Pore, MD  Name Hannah Mcdonald, Hannah Mcdonald Date of Birth June 16, 1955 Referring Provider: Gerarda Fraction, MD Patient MRN: [id number]  Most recent Assessments: Depression (PHQ-9) 05/09/2023 7 Anxiety (GAD-7) 05/09/2023 9   --  Marissa Nestle [facility] Manager

## 2023-05-25 ENCOUNTER — Other Ambulatory Visit: Payer: Self-pay

## 2023-05-25 DIAGNOSIS — C9000 Multiple myeloma not having achieved remission: Secondary | ICD-10-CM

## 2023-05-28 ENCOUNTER — Other Ambulatory Visit: Payer: Self-pay | Admitting: Oncology

## 2023-05-28 ENCOUNTER — Inpatient Hospital Stay

## 2023-05-28 ENCOUNTER — Inpatient Hospital Stay: Admitting: Pharmacist

## 2023-05-28 VITALS — BP 108/50 | HR 75 | Temp 98.1°F | Resp 16

## 2023-05-28 DIAGNOSIS — C9001 Multiple myeloma in remission: Secondary | ICD-10-CM | POA: Diagnosis not present

## 2023-05-28 DIAGNOSIS — C9000 Multiple myeloma not having achieved remission: Secondary | ICD-10-CM

## 2023-05-28 DIAGNOSIS — F4323 Adjustment disorder with mixed anxiety and depressed mood: Secondary | ICD-10-CM | POA: Diagnosis not present

## 2023-05-28 DIAGNOSIS — R21 Rash and other nonspecific skin eruption: Secondary | ICD-10-CM

## 2023-05-28 LAB — CBC WITH DIFFERENTIAL (CANCER CENTER ONLY)
Abs Immature Granulocytes: 0.01 10*3/uL (ref 0.00–0.07)
Basophils Absolute: 0 10*3/uL (ref 0.0–0.1)
Basophils Relative: 1 %
Eosinophils Absolute: 0.1 10*3/uL (ref 0.0–0.5)
Eosinophils Relative: 3 %
HCT: 35.8 % — ABNORMAL LOW (ref 36.0–46.0)
Hemoglobin: 11.6 g/dL — ABNORMAL LOW (ref 12.0–15.0)
Immature Granulocytes: 0 %
Lymphocytes Relative: 24 %
Lymphs Abs: 0.7 10*3/uL (ref 0.7–4.0)
MCH: 32.1 pg (ref 26.0–34.0)
MCHC: 32.4 g/dL (ref 30.0–36.0)
MCV: 99.2 fL (ref 80.0–100.0)
Monocytes Absolute: 0.5 10*3/uL (ref 0.1–1.0)
Monocytes Relative: 17 %
Neutro Abs: 1.5 10*3/uL — ABNORMAL LOW (ref 1.7–7.7)
Neutrophils Relative %: 55 %
Platelet Count: 111 10*3/uL — ABNORMAL LOW (ref 150–400)
RBC: 3.61 MIL/uL — ABNORMAL LOW (ref 3.87–5.11)
RDW: 14.3 % (ref 11.5–15.5)
WBC Count: 2.8 10*3/uL — ABNORMAL LOW (ref 4.0–10.5)
nRBC: 0 % (ref 0.0–0.2)

## 2023-05-28 LAB — CMP (CANCER CENTER ONLY)
ALT: 21 U/L (ref 0–44)
AST: 24 U/L (ref 15–41)
Albumin: 3.8 g/dL (ref 3.5–5.0)
Alkaline Phosphatase: 65 U/L (ref 38–126)
Anion gap: 8 (ref 5–15)
BUN: 17 mg/dL (ref 8–23)
CO2: 23 mmol/L (ref 22–32)
Calcium: 8.8 mg/dL — ABNORMAL LOW (ref 8.9–10.3)
Chloride: 107 mmol/L (ref 98–111)
Creatinine: 1.14 mg/dL — ABNORMAL HIGH (ref 0.44–1.00)
GFR, Estimated: 53 mL/min — ABNORMAL LOW (ref >60)
Glucose, Bld: 108 mg/dL — ABNORMAL HIGH (ref 70–99)
Potassium: 3.4 mmol/L — ABNORMAL LOW (ref 3.5–5.1)
Sodium: 138 mmol/L (ref 135–145)
Total Bilirubin: 0.3 mg/dL (ref 0.0–1.2)
Total Protein: 6.6 g/dL (ref 6.5–8.1)

## 2023-05-28 MED ORDER — HEPARIN SOD (PORK) LOCK FLUSH 100 UNIT/ML IV SOLN
250.0000 [IU] | Freq: Once | INTRAVENOUS | Status: DC | PRN
  Filled 2023-05-28: qty 5

## 2023-05-28 MED ORDER — SODIUM CHLORIDE 0.9 % IV SOLN
Freq: Once | INTRAVENOUS | Status: AC
  Filled 2023-05-28: qty 250

## 2023-05-28 MED ORDER — PREDNISONE 10 MG PO TABS: 10.0000 mg | ORAL_TABLET | Freq: Every day | ORAL | 3 refills | Status: AC

## 2023-05-28 MED ORDER — ZOLEDRONIC ACID 4 MG/5ML IV CONC
3.3000 mg | Freq: Once | INTRAVENOUS | Status: AC
  Administered 2023-05-28: 3.3 mg via INTRAVENOUS
  Filled 2023-05-28: qty 4.13

## 2023-05-28 NOTE — Progress Notes (Signed)
 Clinical Pharmacist Practitioner Clinic [facility]  [phone] [phone]  Patient Care Team: Jaclyn Shaggy, MD as PCP - General (Internal Medicine)   Name of the patient: Hannah Mcdonald  [id number]  Jul 24, 1955   Date of visit: 05/28/23  HPI: Patient is a 68 y.o. female with lambda light chain myeloma s/p transplant. Maintenance lenalidomide was held due to pancytopenia, but with count improvement patient restarted lenalidomide in Nov 2024.   Reason for Consult: Oral chemotherapy follow-up for lenalidomide therapy.   PAST MEDICAL HISTORY: Past Medical History:  Diagnosis Date   Anxiety    Diabetes mellitus without complication (HCC) 2016   GERD (gastroesophageal reflux disease)    Hyperlipidemia    Myeloma (HCC)    Myeloma (HCC)    Personal history of colonic polyps    Squamous cell carcinoma of skin 11/25/2013   Left chest. WD SCC with superficial infiltration.   Tachycardia     HEMATOLOGY/ONCOLOGY HISTORY:  Oncology History  Lambda light chain myeloma (HCC)  08/24/2020 Initial Diagnosis   Lambda light chain myeloma (HCC)   09/16/2020 Cancer Staging   Staging form: Plasma Cell Myeloma and Plasma Cell Disorders, AJCC 8th Edition - Clinical stage from 09/16/2020: RISS Stage I (Beta-2-microglobulin (mg/L): 2.5, Albumin (g/dL): 4.9, ISS: Stage I, High-risk cytogenetics: Absent, LDH: Normal) - Signed by Jeralyn Ruths, MD on 09/16/2020 Beta 2 microglobulin range (mg/L): Less than 3.5 Albumin range (g/dL): Greater than or equal to 3.5 Cytogenetics: 1p deletion   11/02/2020 - 07/22/2021 Chemotherapy   Patient is on Treatment Plan : POST TRANSPLANT DaraVRd (Daratumumab SQ) q21d x 2 Cycles (Consolidation)- cycle 5&6     08/02/2021 - 09/27/2021 Chemotherapy   Patient is on Treatment Plan : MYELOMA Daratumumab SQ q28d- Maintainance treatment w/ revlimid     08/02/2021 - 08/15/2022 Chemotherapy   Patient is on Treatment Plan : MYELOMA  POST-TRANSPLANT MAINTENANE Daratumumab SQ + Lenalidomide q28d (Maintenance)       ALLERGIES:  has no known allergies.  MEDICATIONS:  Current Outpatient Medications  Medication Sig Dispense Refill   acetaminophen (TYLENOL) 325 MG tablet Take 650 mg by mouth 2 (two) times a week. Premed for velcade. Taking Tuesdays and Friday     acyclovir (ZOVIRAX) 400 MG tablet TAKE 1 TABLET BY MOUTH TWICE DAILY 60 tablet 5   albuterol (VENTOLIN HFA) 108 (90 Base) MCG/ACT inhaler Inhale 2 puffs into the lungs every 4 (four) hours as needed.     ALPRAZolam (XANAX) 0.5 MG tablet Take 1 tablet (0.5 mg total) by mouth at bedtime as needed for anxiety. TAKE 1 TABLET BY MOUTH AT BEDTIME AS NEEDED FOR ANXIETY 30 tablet 0   ascorbic acid (VITAMIN C) 500 MG tablet Take 500 mg by mouth daily.     atorvastatin (LIPITOR) 20 MG tablet Take 20 mg by mouth daily.     busPIRone (BUSPAR) 30 MG tablet Take 30 mg by mouth 2 (two) times daily.     Calcium Carb-Cholecalciferol (OYSTER SHELL CALCIUM/VITAMIN D) 500-5 MG-MCG PACK Take by mouth.     CALCIUM-VITAMIN D PO Take by mouth 2 (two) times daily.     cyclobenzaprine (FLEXERIL) 10 MG tablet TAKE 1 TABLET BY MOUTH AT BEDTIME 30 tablet 2   dexamethasone (DECADRON) 4 MG tablet Take 4 mg by mouth daily. 5 tabs po q28 days (Patient not taking: Reported on 10/10/2022)     diphenhydrAMINE (BENADRYL) 25 mg capsule Take 25 mg by mouth daily.     diphenoxylate-atropine (LOMOTIL) 2.5-0.025  MG tablet Take 1-2 tablets by mouth every 8 (eight) hours as needed for diarrhea or loose stools. 90 tablet 0   ferrous sulfate 325 (65 FE) MG tablet Take 325 mg by mouth daily with breakfast.     gabapentin (NEURONTIN) 300 MG capsule TAKE 1 CAPSULE BY MOUTH TWICE DAILY 60 capsule 2   ivabradine (CORLANOR) 5 MG TABS tablet Take 5 mg by mouth 2 (two) times daily with a meal.     lenalidomide (REVLIMID) 5 MG capsule TAKE 1 CAPSULE BY MOUTH 1 TIME A DAY FOR 21 DAYS ON THEN 7 DAYS OFF 21 capsule 0    lidocaine-prilocaine (EMLA) cream APPLY 1 APPLICATION TOPICALLY AS NEEDED 30 g 2   Magnesium 400 MG TABS Take 1 tablet by mouth daily.     meloxicam (MOBIC) 15 MG tablet Take 15 mg by mouth daily.     methocarbamol (ROBAXIN) 500 MG tablet Take 1 tablet (500 mg total) by mouth every 6 (six) hours as needed for muscle spasms. (Patient not taking: Reported on 10/10/2022) 40 tablet 1   MOUNJARO 7.5 MG/0.5ML Pen Inject 7.5 mg into the skin once a week.     Multiple Vitamin (MULTI-VITAMIN) tablet Take 1 tablet by mouth daily.     omeprazole (PRILOSEC) 40 MG capsule Take 40 mg by mouth daily.     ondansetron (ZOFRAN) 8 MG tablet Take 1 tablet (8 mg total) by mouth 2 (two) times daily as needed (Nausea or vomiting). 60 tablet 2   oxyCODONE-acetaminophen (PERCOCET/ROXICET) 5-325 MG tablet TAKE 1-2 TABLETS BY MOUTH EVERY 4 HOURS AS NEEDED FOR SEVERE PAIN 90 tablet 0   predniSONE (DELTASONE) 10 MG tablet Take 1 tablet (10 mg total) by mouth daily. Take with food. Take every 28 days as needed for Revlimid rash. 3 tablet 3   prochlorperazine (COMPAZINE) 10 MG tablet TAKE 1 TABLET BY MOUTH EVERY 6 HOURS AS NEEDED FOR NAUSEA OR VOMITING 60 tablet 2   traMADol (ULTRAM) 50 MG tablet TAKE 1 TABLET BY MOUTH EVERY 6 HOURS AS NEEDED FOR PAIN 60 tablet 1   traZODone (DESYREL) 50 MG tablet TAKE 1 TABLET BY MOUTH AT BEDTIME 30 tablet 1   No current facility-administered medications for this visit.   Facility-Administered Medications Ordered in Other Visits  Medication Dose Route Frequency Provider Last Rate Last Admin   0.9 %  sodium chloride infusion   Intravenous Continuous Jeralyn Ruths, MD   Stopped at 04/07/21 1142   heparin lock flush 100 unit/mL  500 Units Intravenous Once Jeralyn Ruths, MD       heparin lock flush 100 unit/mL  250 Units Intracatheter Once PRN Jeralyn Ruths, MD       sodium chloride flush (NS) 0.9 % injection 10 mL  10 mL Intravenous PRN Jeralyn Ruths, MD   10 mL at  11/23/20 0932   sodium chloride flush (NS) 0.9 % injection 10 mL  10 mL Intravenous PRN Jeralyn Ruths, MD   10 mL at 04/18/22 1417   sodium chloride flush (NS) 0.9 % injection 10 mL  10 mL Intravenous Once Jeralyn Ruths, MD        VITAL SIGNS: There were no vitals taken for this visit. There were no vitals filed for this visit.  Estimated body mass index is 23.72 kg/m as calculated from the following:   Height as of 04/30/23: 5\' 2"  (1.575 m).   Weight as of 04/30/23: 58.8 kg (129 lb 11.2 oz).  LABS:  CBC:    Component Value Date/Time   WBC 2.8 (L) 05/28/2023 1315   WBC 3.9 (L) 04/30/2023 1353   HGB 11.6 (L) 05/28/2023 1315   HCT 35.8 (L) 05/28/2023 1315   PLT 111 (L) 05/28/2023 1315   MCV 99.2 05/28/2023 1315   NEUTROABS 1.5 (L) 05/28/2023 1315   LYMPHSABS 0.7 05/28/2023 1315   MONOABS 0.5 05/28/2023 1315   EOSABS 0.1 05/28/2023 1315   BASOSABS 0.0 05/28/2023 1315   Comprehensive Metabolic Panel:    Component Value Date/Time   NA 138 05/28/2023 1315   K 3.4 (L) 05/28/2023 1315   CL 107 05/28/2023 1315   CO2 23 05/28/2023 1315   BUN 17 05/28/2023 1315   CREATININE 1.14 (H) 05/28/2023 1315   GLUCOSE 108 (H) 05/28/2023 1315   CALCIUM 8.8 (L) 05/28/2023 1315   AST 24 05/28/2023 1315   ALT 21 05/28/2023 1315   ALKPHOS 65 05/28/2023 1315   BILITOT 0.3 05/28/2023 1315   PROT 6.6 05/28/2023 1315   ALBUMIN 3.8 05/28/2023 1315     Present during today's visit: patient only  Assessment and Plan: CMP/CBC reviewed, patient to continue lenalidomide 5mg  21 on/7 off Patient restarted her lenalidomide yesterday 05/27/23 She will restart her aspirin 81mg  daily for thromboprophylaxis  With platelet count improvement, could consider increasing lenalidomide to 10mg  in the future. (Patient reports having a full bottle of 10mg  capsules on hand.) MD would like to wait until counts returned to normal and remain there for a bit   Oral Chemotherapy Side Effect/Intolerance:  Rash:  patient continues to take prednisone for the first 3 days of her cycle for rash management. She will continue this practice. She also takes diphenhydramine daily Per patient [facility] offered her the option of lenalidomide desensitizing program, she may consider this in the future No other side effects noted   Oral Chemotherapy Adherence: No missed doses reported No patient barriers to medication adherence identified.   New medications: no new meds  Medication Access Issues: No issues, patient fills medication at [facility]  Patient expressed understanding and was in agreement with this plan. She also understands that She can call clinic at any time with any questions, concerns, or complaints.   Follow-up plan: RTC in 4 weeks  Thank you for allowing me to participate in the care of this very pleasant patient.   Time Total: 15 mins  Visit consisted of counseling and education on dealing with issues of symptom management in the setting of serious and potentially life-threatening illness.Greater than 50%  of this time was spent counseling and coordinating care related to the above assessment and plan.  Signed by: Remi Haggard, PharmD, BCPS, Nolon Bussing, CPP Hematology/Oncology Clinical Pharmacist Practitioner [place] [phone]  05/28/2023 3:27 PM

## 2023-05-29 ENCOUNTER — Encounter: Payer: Self-pay | Admitting: Oncology

## 2023-05-29 LAB — KAPPA/LAMBDA LIGHT CHAINS
Kappa free light chain: 17.9 mg/L (ref 3.3–19.4)
Kappa, lambda light chain ratio: 1.28 (ref 0.26–1.65)
Lambda free light chains: 14 mg/L (ref 5.7–26.3)

## 2023-06-07 ENCOUNTER — Other Ambulatory Visit: Payer: Self-pay | Admitting: Oncology

## 2023-06-07 DIAGNOSIS — C9000 Multiple myeloma not having achieved remission: Secondary | ICD-10-CM

## 2023-06-25 ENCOUNTER — Other Ambulatory Visit: Payer: Self-pay | Admitting: *Deleted

## 2023-06-25 DIAGNOSIS — C9000 Multiple myeloma not having achieved remission: Secondary | ICD-10-CM

## 2023-06-26 ENCOUNTER — Inpatient Hospital Stay

## 2023-06-26 ENCOUNTER — Encounter: Payer: Self-pay | Admitting: Oncology

## 2023-06-26 ENCOUNTER — Inpatient Hospital Stay: Admitting: Oncology

## 2023-06-26 ENCOUNTER — Inpatient Hospital Stay: Attending: Oncology

## 2023-06-26 ENCOUNTER — Other Ambulatory Visit: Payer: Self-pay | Admitting: Oncology

## 2023-06-26 VITALS — BP 117/55 | HR 74 | Temp 97.9°F | Resp 16 | Ht 62.0 in | Wt 125.3 lb

## 2023-06-26 DIAGNOSIS — D693 Immune thrombocytopenic purpura: Secondary | ICD-10-CM | POA: Diagnosis not present

## 2023-06-26 DIAGNOSIS — Z7952 Long term (current) use of systemic steroids: Secondary | ICD-10-CM | POA: Diagnosis not present

## 2023-06-26 DIAGNOSIS — Z7969 Long term (current) use of other immunomodulators and immunosuppressants: Secondary | ICD-10-CM | POA: Insufficient documentation

## 2023-06-26 DIAGNOSIS — Z791 Long term (current) use of non-steroidal anti-inflammatories (NSAID): Secondary | ICD-10-CM | POA: Diagnosis not present

## 2023-06-26 DIAGNOSIS — Z7961 Long term (current) use of immunomodulator: Secondary | ICD-10-CM | POA: Insufficient documentation

## 2023-06-26 DIAGNOSIS — C9001 Multiple myeloma in remission: Secondary | ICD-10-CM | POA: Insufficient documentation

## 2023-06-26 DIAGNOSIS — Z79899 Other long term (current) drug therapy: Secondary | ICD-10-CM | POA: Diagnosis not present

## 2023-06-26 DIAGNOSIS — D649 Anemia, unspecified: Secondary | ICD-10-CM | POA: Diagnosis not present

## 2023-06-26 DIAGNOSIS — Z7982 Long term (current) use of aspirin: Secondary | ICD-10-CM | POA: Diagnosis not present

## 2023-06-26 DIAGNOSIS — C9000 Multiple myeloma not having achieved remission: Secondary | ICD-10-CM

## 2023-06-26 DIAGNOSIS — Z9484 Stem cells transplant status: Secondary | ICD-10-CM | POA: Diagnosis not present

## 2023-06-26 DIAGNOSIS — Z79624 Long term (current) use of inhibitors of nucleotide synthesis: Secondary | ICD-10-CM | POA: Insufficient documentation

## 2023-06-26 LAB — CMP (CANCER CENTER ONLY)
ALT: 20 U/L (ref 0–44)
AST: 24 U/L (ref 15–41)
Albumin: 3.8 g/dL (ref 3.5–5.0)
Alkaline Phosphatase: 60 U/L (ref 38–126)
Anion gap: 9 (ref 5–15)
BUN: 17 mg/dL (ref 8–23)
CO2: 24 mmol/L (ref 22–32)
Calcium: 8.8 mg/dL — ABNORMAL LOW (ref 8.9–10.3)
Chloride: 103 mmol/L (ref 98–111)
Creatinine: 1.15 mg/dL — ABNORMAL HIGH (ref 0.44–1.00)
GFR, Estimated: 52 mL/min — ABNORMAL LOW (ref >60)
Glucose, Bld: 114 mg/dL — ABNORMAL HIGH (ref 70–99)
Potassium: 3.3 mmol/L — ABNORMAL LOW (ref 3.5–5.1)
Sodium: 136 mmol/L (ref 135–145)
Total Bilirubin: 0.6 mg/dL (ref 0.0–1.2)
Total Protein: 6.2 g/dL — ABNORMAL LOW (ref 6.5–8.1)

## 2023-06-26 LAB — CBC WITH DIFFERENTIAL/PLATELET
Abs Immature Granulocytes: 0.01 10*3/uL (ref 0.00–0.07)
Basophils Absolute: 0.1 10*3/uL (ref 0.0–0.1)
Basophils Relative: 2 %
Eosinophils Absolute: 0.1 10*3/uL (ref 0.0–0.5)
Eosinophils Relative: 2 %
HCT: 34.3 % — ABNORMAL LOW (ref 36.0–46.0)
Hemoglobin: 11.3 g/dL — ABNORMAL LOW (ref 12.0–15.0)
Immature Granulocytes: 0 %
Lymphocytes Relative: 24 %
Lymphs Abs: 0.8 10*3/uL (ref 0.7–4.0)
MCH: 32.8 pg (ref 26.0–34.0)
MCHC: 32.9 g/dL (ref 30.0–36.0)
MCV: 99.4 fL (ref 80.0–100.0)
Monocytes Absolute: 0.6 10*3/uL (ref 0.1–1.0)
Monocytes Relative: 17 %
Neutro Abs: 1.9 10*3/uL (ref 1.7–7.7)
Neutrophils Relative %: 55 %
Platelets: 103 10*3/uL — ABNORMAL LOW (ref 150–400)
RBC: 3.45 MIL/uL — ABNORMAL LOW (ref 3.87–5.11)
RDW: 15.2 % (ref 11.5–15.5)
WBC: 3.4 10*3/uL — ABNORMAL LOW (ref 4.0–10.5)
nRBC: 0 % (ref 0.0–0.2)

## 2023-06-26 LAB — SAMPLE TO BLOOD BANK

## 2023-06-26 NOTE — Progress Notes (Signed)
 [facility]  [phone] [phone]  ID: Ragena Bunker OB: 04-03-1955  MR#: [id number]  YQM#:[id number]  Patient Care Team: Westley Hammers, MD as PCP - General (Internal Medicine)   CHIEF COMPLAINT: Lambda light chain myeloma with 1p del, in remission.  ITP.  INTERVAL HISTORY: Patient returns to clinic today for repeat laboratory work and continuation of Revlimid .  She typically takes 10 mg prednisone  on the first 3 days of Revlimid  and has not developed a rash the rest of the month.  She otherwise is tolerating treatment well.  She does not complain of any joint pain today. Her peripheral neuropathy is unchanged. She has no other neurologic complaints.  She denies any recent fevers or illnesses.  She denies any chest pain, shortness of breath, cough, or hemoptysis.  She denies any nausea, vomiting, constipation, or diarrhea.  She has no urinary complaints.  Patient offers no further specific complaints today.  REVIEW OF SYSTEMS:   Review of Systems  Constitutional: Negative.  Negative for fever, malaise/fatigue and weight loss.  HENT: Negative.  Negative for congestion and sinus pain.   Respiratory: Negative.  Negative for cough, hemoptysis and shortness of breath.   Cardiovascular:  Negative for chest pain and leg swelling.  Gastrointestinal: Negative.  Negative for abdominal pain, diarrhea and nausea.  Genitourinary: Negative.  Negative for dysuria.  Musculoskeletal: Negative.  Negative for back pain, joint pain and neck pain.  Skin: Negative.  Negative for rash.  Neurological:  Positive for sensory change. Negative for dizziness, tremors, focal weakness, weakness and headaches.  Endo/Heme/Allergies: Negative.  Does not bruise/bleed easily.  Psychiatric/Behavioral: Negative.  The patient is not nervous/anxious and does not have insomnia.     As per HPI. Otherwise, a complete review of systems is negative.  PAST MEDICAL HISTORY: Past Medical  History:  Diagnosis Date   Anxiety    Diabetes mellitus without complication (HCC) 2016   GERD (gastroesophageal reflux disease)    Hyperlipidemia    Myeloma (HCC)    Myeloma (HCC)    Personal history of colonic polyps    Squamous cell carcinoma of skin 11/25/2013   Left chest. WD SCC with superficial infiltration.   Tachycardia     PAST SURGICAL HISTORY: Past Surgical History:  Procedure Laterality Date   AUGMENTATION MAMMAPLASTY Bilateral 1983   silicone and replacement in 2001   BREAST ENHANCEMENT SURGERY  1983   CESAREAN SECTION  1987, 1989   COLONOSCOPY  1988, 1998,2003, 2008, 2013   COLONOSCOPY WITH PROPOFOL  N/A 08/16/2016   Procedure: COLONOSCOPY WITH PROPOFOL ;  Surgeon: Marshall Skeeter, MD;  Location: [facility] ENDOSCOPY;  Service: Endoscopy;  Laterality: N/A;   COLONOSCOPY WITH PROPOFOL  N/A 10/05/2021   Procedure: COLONOSCOPY WITH PROPOFOL ;  Surgeon: Marshall Skeeter, MD;  Location: [facility] ENDOSCOPY;  Service: Endoscopy;  Laterality: N/A;  HAS A PORT, NEEDS AMPICILLIN  PER OFFICE   DILATION AND CURETTAGE OF UTERUS     ENDOMETRIAL ABLATION  12/1991   ganglion cyst removal      IR BONE MARROW BIOPSY & ASPIRATION  08/08/2022   IR IMAGING GUIDED PORT INSERTION  10/27/2020   IR IMAGING GUIDED PORT INSERTION  04/04/2021   KYPHOPLASTY N/A 10/28/2020   Procedure: T12 and L3 KYPHOPLASTY;  Surgeon: Molli Angelucci, MD;  Location: [facility] ORS;  Service: Orthopedics;  Laterality: N/A;   TONSILLECTOMY     WRIST SURGERY Right 05/1995    FAMILY HISTORY: Family History  Problem Relation Age of Onset   Colon polyps Sister  Colon cancer Father 29   Diabetes Mother    Breast cancer Neg Hx     ADVANCED DIRECTIVES (Y/N):  N  HEALTH MAINTENANCE: Social History   Tobacco Use   Smoking status: Former    Current packs/day: 0.00    Average packs/day: 1 pack/day for 4.0 years (4.0 ttl pk-yrs)    Types: Cigarettes    Start date: 89    Quit date: 1982    Years since quitting: 43.3    Smokeless tobacco: Never  Vaping Use   Vaping status: Never Used  Substance Use Topics   Alcohol use: Yes    Alcohol/week: 1.0 - 2.0 standard drink of alcohol    Types: 1 - 2 Glasses of wine per week    Comment: occassionally   Drug use: No     Colonoscopy:  PAP:  Bone density:  Lipid panel:  No Known Allergies  Current Outpatient Medications  Medication Sig Dispense Refill   acetaminophen  (TYLENOL ) 325 MG tablet Take 650 mg by mouth 2 (two) times a week. Premed for velcade . Taking Tuesdays and Friday     acyclovir  (ZOVIRAX ) 400 MG tablet TAKE 1 TABLET BY MOUTH TWICE DAILY 60 tablet 5   albuterol (VENTOLIN HFA) 108 (90 Base) MCG/ACT inhaler Inhale 2 puffs into the lungs every 4 (four) hours as needed.     ALPRAZolam  (XANAX ) 0.5 MG tablet Take 1 tablet (0.5 mg total) by mouth at bedtime as needed for anxiety. TAKE 1 TABLET BY MOUTH AT BEDTIME AS NEEDED FOR ANXIETY 30 tablet 0   ascorbic acid (VITAMIN C) 500 MG tablet Take 500 mg by mouth daily.     ASPIRIN  81 PO Take 81 mg by mouth daily.     atorvastatin  (LIPITOR) 20 MG tablet Take 20 mg by mouth daily.     busPIRone  (BUSPAR ) 30 MG tablet Take 30 mg by mouth 2 (two) times daily.     Calcium  Carb-Cholecalciferol  (OYSTER SHELL CALCIUM /VITAMIN D) 500-5 MG-MCG PACK Take by mouth.     CALCIUM -VITAMIN D PO Take by mouth 2 (two) times daily.     cyclobenzaprine  (FLEXERIL ) 10 MG tablet TAKE 1 TABLET BY MOUTH AT BEDTIME 30 tablet 2   diphenhydrAMINE  (BENADRYL ) 25 mg capsule Take 25 mg by mouth daily.     diphenoxylate -atropine  (LOMOTIL ) 2.5-0.025 MG tablet Take 1-2 tablets by mouth every 8 (eight) hours as needed for diarrhea or loose stools. 90 tablet 0   ferrous sulfate 325 (65 FE) MG tablet Take 325 mg by mouth daily with breakfast.     gabapentin  (NEURONTIN ) 300 MG capsule TAKE 1 CAPSULE BY MOUTH TWICE DAILY 60 capsule 2   ivabradine (CORLANOR) 5 MG TABS tablet Take 5 mg by mouth 2 (two) times daily with a meal.     lenalidomide   (REVLIMID ) 5 MG capsule TAKE 1 CAPSULE BY MOUTH 1 TIME A DAY FOR 21 DAYS ON THEN 7 DAYS OFF 21 capsule 0   Magnesium  400 MG TABS Take 1 tablet by mouth daily.     meloxicam  (MOBIC ) 15 MG tablet Take 15 mg by mouth daily.     MOUNJARO 7.5 MG/0.5ML Pen Inject 7.5 mg into the skin once a week.     Multiple Vitamin (MULTI-VITAMIN) tablet Take 1 tablet by mouth daily.     omeprazole (PRILOSEC) 40 MG capsule Take 40 mg by mouth daily.     ondansetron  (ZOFRAN ) 8 MG tablet Take 1 tablet (8 mg total) by mouth 2 (two) times daily as needed (Nausea  or vomiting). 60 tablet 2   oxyCODONE -acetaminophen  (PERCOCET/ROXICET) 5-325 MG tablet TAKE 1-2 TABLETS BY MOUTH EVERY 4 HOURS AS NEEDED FOR SEVERE PAIN 90 tablet 0   predniSONE  (DELTASONE ) 10 MG tablet Take 1 tablet (10 mg total) by mouth daily. Take for 3 days along with start of every 28 day Revlimid  cycle. Take with food. 9 tablet 3   prochlorperazine  (COMPAZINE ) 10 MG tablet TAKE 1 TABLET BY MOUTH EVERY 6 HOURS AS NEEDED FOR NAUSEA OR VOMITING 60 tablet 2   traMADol  (ULTRAM ) 50 MG tablet TAKE 1 TABLET BY MOUTH EVERY 6 HOURS AS NEEDED FOR PAIN 60 tablet 1   traZODone  (DESYREL ) 50 MG tablet TAKE 1 TABLET BY MOUTH AT BEDTIME 30 tablet 1   dexamethasone  (DECADRON ) 4 MG tablet Take 4 mg by mouth daily. 5 tabs po q28 days (Patient not taking: Reported on 06/26/2023)     lidocaine -prilocaine  (EMLA ) cream APPLY 1 APPLICATION TOPICALLY AS NEEDED 30 g 2   methocarbamol  (ROBAXIN ) 500 MG tablet Take 1 tablet (500 mg total) by mouth every 6 (six) hours as needed for muscle spasms. (Patient not taking: Reported on 10/10/2022) 40 tablet 1   No current facility-administered medications for this visit.   Facility-Administered Medications Ordered in Other Visits  Medication Dose Route Frequency Provider Last Rate Last Admin   0.9 %  sodium chloride  infusion   Intravenous Continuous Mounir Skipper J, MD   Stopped at 04/07/21 1142   heparin  lock flush 100 unit/mL  500 Units  Intravenous Once Teyanna Thielman J, MD       sodium chloride  flush (NS) 0.9 % injection 10 mL  10 mL Intravenous PRN Dorathy Stallone J, MD   10 mL at 11/23/20 0932   sodium chloride  flush (NS) 0.9 % injection 10 mL  10 mL Intravenous PRN Yenny Kosa J, MD   10 mL at 04/18/22 1417   sodium chloride  flush (NS) 0.9 % injection 10 mL  10 mL Intravenous Once Jay Haskew J, MD        OBJECTIVE: Vitals:   06/26/23 1355 06/26/23 1401  BP: (!) 117/55 (!) 117/55  Pulse: 74   Resp: 16   Temp: (!) 97.4 F (36.3 C) 97.9 F (36.6 C)  SpO2: 100%        Body mass index is 22.92 kg/m.    ECOG FS:0 - Asymptomatic  General: Well-developed, well-nourished, no acute distress. Eyes: Pink conjunctiva, anicteric sclera. HEENT: Normocephalic, moist mucous membranes. Lungs: No audible wheezing or coughing. Heart: Regular rate and rhythm. Abdomen: Soft, nontender, no obvious distention. Musculoskeletal: No edema, cyanosis, or clubbing. Neuro: Alert, answering all questions appropriately. Cranial nerves grossly intact. Skin: No rashes or petechiae noted. Psych: Normal affect.  LAB RESULTS:  Lab Results  Component Value Date   NA 136 06/26/2023   K 3.3 (L) 06/26/2023   CL 103 06/26/2023   CO2 24 06/26/2023   GLUCOSE 114 (H) 06/26/2023   BUN 17 06/26/2023   CREATININE 1.15 (H) 06/26/2023   CALCIUM  8.8 (L) 06/26/2023   PROT 6.2 (L) 06/26/2023   ALBUMIN 3.8 06/26/2023   AST 24 06/26/2023   ALT 20 06/26/2023   ALKPHOS 60 06/26/2023   BILITOT 0.6 06/26/2023   GFRNONAA 52 (L) 06/26/2023    Lab Results  Component Value Date   WBC 3.4 (L) 06/26/2023   NEUTROABS 1.9 06/26/2023   HGB 11.3 (L) 06/26/2023   HCT 34.3 (L) 06/26/2023   MCV 99.4 06/26/2023   PLT 103 (L) 06/26/2023  STUDIES: No results found.  ONCOLOGY HISTORY:  Diagnosis confirmed from bone biopsy on August 17, 2020.  Initially her lambda free light chains were elevated at 432.1, but now are within normal  limits at 12.1.  Immunoglobulins and SPEP are normal.  Bone marrow biopsy completed on August 26, 2020 revealed 25% plasma cells along with the deletion of 1p which is considered poor prognosis.  PET scan results from September 06, 2020 reviewed independently with 3 hypermetabolic lesions in right C5-6 facets, right iliac crest lesion, and L3 vertebral lesion.  After lengthy discussion with the patient, she agreed to pursue chemotherapy with daratumumab , Velcade , Revlimid , and dexamethasone  followed by autologous bone marrow transplant.  Patient received weekly daratumumab  for 4 cycles along with Velcade  on days 1, 4, 8, and 11.  She also received Revlimid  on days 1 through 14 of a 21-day cycle.  She completed cycle 4 of treatment on January 14, 2021.  Subsequent bone marrow biopsy on January 24, 2021 revealed complete remission with no evidence of myeloma.  Patient underwent autologous stem cell transplant at [facility] on February 24, 2021.  She was readmitted to the hospital on March 04, 2021 with neutropenic fever, diarrhea, and sepsis-like symptoms.  Infectious disease work-up was negative.   Patient previously received consolidation treatment with the GRIFFIN regimen.  She received consolidation with cycle 5 and 6 using daratumumab  on day 1, Velcade  on day 1, 4, 8, and 11, Revlimid  10 mg on days 1 through 14 with 7 days off, and weekly dexamethasone  on a 21-day cycle.  She was on maintenance daratumumab  on day 1 and Revlimid  10 mg on days 1 through 21 on a 28-day cycle for cycles 7 through 32.  Her most recent PET scan on July 31, 2022 reviewed independently and report as above with no obvious evidence of recurrent disease.  A second bone marrow biopsy was completed on August 08, 2022 which also revealed no evidence of disease with normal cytogenetics.    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  PLAN:      Lambda light chain myeloma with 1p del:  Given her persistent  pancytopenia, maintenance Revlimid  was discontinued in approximately July 2024 and then reinitiated in November 2024.  Her most recent bone marrow biopsy at [facility] on November 07, 2022 reported complete remission.  Laboratory work at [facility] on March 05, 2023 reviewed continued complete remission.  Continue dose reduced to 5 mg Revlimid  daily for 21 days with 7 days off.  Will not increase dose of Revlimid  at this time.  Patient now receives Zometa  every 3 months.  Her last dose was on May 28, 2023.  Return to clinic in 4 weeks for laboratory work, further evaluation, and continuation of treatment.  Will get restaging PET scan prior to next appointment.   Thrombocytopenia: Chronic and unchanged.  Patient's platelet count is 103.  Continue low-dose Revlimid  as above.  Anemia: Mild, monitor. Leukopenia: Chronic and unchanged.  Monitor. Hypocalcemia: Mild.   Renal insufficiency: Patient's GFR remains stable at 52. Vaccinations: Continue revaccination schedule as per [facility]. Pulmonary nodule: Resolved. Left knee pain: Patient does not complain of this today.  Continue conservative management and injections as needed per orthopedics. Hand pain: Continue conservative management and injections as needed per orthopedics Neuropathy: Chronic and unchanged.  Continue follow-up with neurology as scheduled.  Continue tai chi. Rash: Possibly related to Revlimid .  Continue dose reduction as above.  Patient takes and  is on 10 mg daily for 3 days at the beginning of each cycle.  Patient expressed understanding and was in agreement with this plan. She also understands that She can call clinic at any time with any questions, concerns, or complaints.      Cancer Staging  Lambda light chain myeloma (HCC) Staging form: Plasma Cell Myeloma and Plasma Cell Disorders, AJCC 8th Edition - Clinical stage from 09/16/2020: RISS Stage I (Beta-2 -microglobulin (mg/L): 2.5, Albumin (g/dL): 4.9, ISS:  Stage I, High-risk cytogenetics: Absent, LDH: Normal) - Signed by Shellie Dials, MD on 09/16/2020 Beta 2 microglobulin range (mg/L): Less than 3.5 Albumin range (g/dL): Greater than or equal to 3.5 Cytogenetics: 1p deletion  Shellie Dials, MD   06/26/2023 3:16 PM

## 2023-06-27 DIAGNOSIS — F4323 Adjustment disorder with mixed anxiety and depressed mood: Secondary | ICD-10-CM | POA: Diagnosis not present

## 2023-06-27 DIAGNOSIS — C9000 Multiple myeloma not having achieved remission: Secondary | ICD-10-CM | POA: Diagnosis not present

## 2023-06-27 LAB — KAPPA/LAMBDA LIGHT CHAINS
Kappa free light chain: 14.4 mg/L (ref 3.3–19.4)
Kappa, lambda light chain ratio: 1.31 (ref 0.26–1.65)
Lambda free light chains: 11 mg/L (ref 5.7–26.3)

## 2023-07-02 ENCOUNTER — Other Ambulatory Visit: Payer: Self-pay | Admitting: Oncology

## 2023-07-02 DIAGNOSIS — C9000 Multiple myeloma not having achieved remission: Secondary | ICD-10-CM

## 2023-07-16 ENCOUNTER — Other Ambulatory Visit: Payer: Self-pay

## 2023-07-16 DIAGNOSIS — C9000 Multiple myeloma not having achieved remission: Secondary | ICD-10-CM

## 2023-07-17 ENCOUNTER — Inpatient Hospital Stay: Attending: Oncology

## 2023-07-17 ENCOUNTER — Other Ambulatory Visit: Payer: Self-pay | Admitting: Oncology

## 2023-07-17 ENCOUNTER — Ambulatory Visit
Admission: RE | Admit: 2023-07-17 | Discharge: 2023-07-17 | Disposition: A | Discharge status: Home / Self Care | Source: Ambulatory Visit | Attending: Oncology | Admitting: Oncology

## 2023-07-17 DIAGNOSIS — Z87891 Personal history of nicotine dependence: Secondary | ICD-10-CM | POA: Diagnosis not present

## 2023-07-17 DIAGNOSIS — Z7961 Long term (current) use of immunomodulator: Secondary | ICD-10-CM | POA: Insufficient documentation

## 2023-07-17 DIAGNOSIS — Z79624 Long term (current) use of inhibitors of nucleotide synthesis: Secondary | ICD-10-CM | POA: Insufficient documentation

## 2023-07-17 DIAGNOSIS — C9001 Multiple myeloma in remission: Secondary | ICD-10-CM | POA: Diagnosis present

## 2023-07-17 DIAGNOSIS — C9000 Multiple myeloma not having achieved remission: Secondary | ICD-10-CM | POA: Diagnosis present

## 2023-07-17 DIAGNOSIS — Z7982 Long term (current) use of aspirin: Secondary | ICD-10-CM | POA: Insufficient documentation

## 2023-07-17 DIAGNOSIS — Z9484 Stem cells transplant status: Secondary | ICD-10-CM | POA: Diagnosis not present

## 2023-07-17 DIAGNOSIS — Z7969 Long term (current) use of other immunomodulators and immunosuppressants: Secondary | ICD-10-CM | POA: Diagnosis not present

## 2023-07-17 DIAGNOSIS — D649 Anemia, unspecified: Secondary | ICD-10-CM | POA: Insufficient documentation

## 2023-07-17 DIAGNOSIS — Z79899 Other long term (current) drug therapy: Secondary | ICD-10-CM | POA: Insufficient documentation

## 2023-07-17 LAB — CMP (CANCER CENTER ONLY)
ALT: 22 U/L (ref 0–44)
AST: 29 U/L (ref 15–41)
Albumin: 3.7 g/dL (ref 3.5–5.0)
Alkaline Phosphatase: 69 U/L (ref 38–126)
Anion gap: 7 (ref 5–15)
BUN: 13 mg/dL (ref 8–23)
CO2: 23 mmol/L (ref 22–32)
Calcium: 8.3 mg/dL — ABNORMAL LOW (ref 8.9–10.3)
Chloride: 107 mmol/L (ref 98–111)
Creatinine: 1.36 mg/dL — ABNORMAL HIGH (ref 0.44–1.00)
GFR, Estimated: 43 mL/min — ABNORMAL LOW (ref >60)
Glucose, Bld: 125 mg/dL — ABNORMAL HIGH (ref 70–99)
Potassium: 3 mmol/L — ABNORMAL LOW (ref 3.5–5.1)
Sodium: 137 mmol/L (ref 135–145)
Total Bilirubin: 0.5 mg/dL (ref 0.0–1.2)
Total Protein: 6.2 g/dL — ABNORMAL LOW (ref 6.5–8.1)

## 2023-07-17 LAB — CBC WITH DIFFERENTIAL (CANCER CENTER ONLY)
Abs Immature Granulocytes: 0 10*3/uL (ref 0.00–0.07)
Basophils Absolute: 0 10*3/uL (ref 0.0–0.1)
Basophils Relative: 1 %
Eosinophils Absolute: 0.1 10*3/uL (ref 0.0–0.5)
Eosinophils Relative: 8 %
HCT: 34.4 % — ABNORMAL LOW (ref 36.0–46.0)
Hemoglobin: 11.4 g/dL — ABNORMAL LOW (ref 12.0–15.0)
Immature Granulocytes: 0 %
Lymphocytes Relative: 34 %
Lymphs Abs: 0.6 10*3/uL — ABNORMAL LOW (ref 0.7–4.0)
MCH: 33.5 pg (ref 26.0–34.0)
MCHC: 33.1 g/dL (ref 30.0–36.0)
MCV: 101.2 fL — ABNORMAL HIGH (ref 80.0–100.0)
Monocytes Absolute: 0.3 10*3/uL (ref 0.1–1.0)
Monocytes Relative: 17 %
Neutro Abs: 0.8 10*3/uL — ABNORMAL LOW (ref 1.7–7.7)
Neutrophils Relative %: 40 %
Platelet Count: 78 10*3/uL — ABNORMAL LOW (ref 150–400)
RBC: 3.4 MIL/uL — ABNORMAL LOW (ref 3.87–5.11)
RDW: 15.6 % — ABNORMAL HIGH (ref 11.5–15.5)
WBC Count: 1.8 10*3/uL — ABNORMAL LOW (ref 4.0–10.5)
nRBC: 0 % (ref 0.0–0.2)

## 2023-07-17 LAB — GLUCOSE, CAPILLARY: Glucose-Capillary: 104 mg/dL — ABNORMAL HIGH (ref 70–99)

## 2023-07-17 LAB — SAMPLE TO BLOOD BANK

## 2023-07-17 MED ORDER — HEPARIN SOD (PORK) LOCK FLUSH 100 UNIT/ML IV SOLN
INTRAVENOUS | Status: AC
  Filled 2023-07-17: qty 5

## 2023-07-17 MED ORDER — FLUDEOXYGLUCOSE F - 18 (FDG) INJECTION
6.7900 | Freq: Once | INTRAVENOUS | Status: AC | PRN
  Administered 2023-07-17: 6.79 via INTRAVENOUS

## 2023-07-17 MED ORDER — HEPARIN SOD (PORK) LOCK FLUSH 100 UNIT/ML IV SOLN
500.0000 [IU] | Freq: Once | INTRAVENOUS | Status: AC
Start: 2023-07-17 — End: 2023-07-17
  Administered 2023-07-17: 500 [IU] via INTRAVENOUS
  Filled 2023-07-17: qty 5

## 2023-07-18 LAB — KAPPA/LAMBDA LIGHT CHAINS
Kappa free light chain: 23.4 mg/L — ABNORMAL HIGH (ref 3.3–19.4)
Kappa, lambda light chain ratio: 1.4 (ref 0.26–1.65)
Lambda free light chains: 16.7 mg/L (ref 5.7–26.3)

## 2023-07-24 ENCOUNTER — Encounter: Payer: Self-pay | Admitting: Oncology

## 2023-07-25 ENCOUNTER — Inpatient Hospital Stay: Admitting: Oncology

## 2023-07-25 ENCOUNTER — Ambulatory Visit: Admitting: Oncology

## 2023-07-26 ENCOUNTER — Inpatient Hospital Stay: Admitting: Oncology

## 2023-07-26 ENCOUNTER — Encounter: Payer: Self-pay | Admitting: Oncology

## 2023-07-26 VITALS — BP 120/63 | HR 76 | Temp 98.6°F | Resp 16 | Ht 62.0 in | Wt 122.5 lb

## 2023-07-26 DIAGNOSIS — C9001 Multiple myeloma in remission: Secondary | ICD-10-CM | POA: Diagnosis not present

## 2023-07-26 DIAGNOSIS — C9000 Multiple myeloma not having achieved remission: Secondary | ICD-10-CM

## 2023-07-26 NOTE — Progress Notes (Signed)
 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  ITP.  INTERVAL HISTORY: Patient returns to clinic today for repeat laboratory work, discussion of her PET scan results, and continuation of Revlimid .  She currently feels well and is asymptomatic.  She is tolerating her treatment without significant side effect.  She does not complain of any joint pain today.  Her peripheral neuropathy has improved since it seeing neurology and initiating gabapentin .  She has no other neurologic complaints.  She denies any recent fevers or illnesses.  She denies any chest pain, shortness of breath, cough, or hemoptysis.  She denies any nausea, vomiting, constipation, or diarrhea.  She has no urinary complaints.  Patient offers no further specific complaints today.  REVIEW OF SYSTEMS:   Review of Systems  Constitutional: Negative.  Negative for fever, malaise/fatigue and weight loss.  HENT: Negative.  Negative for congestion and sinus pain.   Respiratory: Negative.  Negative for cough, hemoptysis and shortness of breath.   Cardiovascular:  Negative for chest pain and leg swelling.  Gastrointestinal: Negative.  Negative for abdominal pain, diarrhea and nausea.  Genitourinary: Negative.  Negative for dysuria.  Musculoskeletal: Negative.  Negative for back pain, joint pain and neck pain.  Skin: Negative.  Negative for rash.  Neurological:  Positive for sensory change. Negative for dizziness, tremors, focal weakness, weakness and headaches.  Endo/Heme/Allergies: Negative.  Does not bruise/bleed easily.  Psychiatric/Behavioral: Negative.  The patient is not nervous/anxious and does not have insomnia.     As per HPI. Otherwise, a complete review of systems is  negative.  PAST MEDICAL HISTORY: Past Medical History:  Diagnosis Date   Anxiety    Diabetes mellitus without complication (HCC) 2016   GERD (gastroesophageal reflux disease)    Hyperlipidemia    Myeloma (HCC)    Myeloma (HCC)    Personal history of colonic polyps    Squamous cell carcinoma of skin 11/25/2013   Left chest. WD SCC with superficial infiltration.   Tachycardia     PAST SURGICAL HISTORY: Past Surgical History:  Procedure Laterality Date   AUGMENTATION MAMMAPLASTY Bilateral 1983   silicone and replacement in 2001   BREAST ENHANCEMENT SURGERY  1983   CESAREAN SECTION  1987, 1989   COLONOSCOPY  1988, 1998,2003, 2008, 2013   COLONOSCOPY WITH PROPOFOL  N/A 08/16/2016   Procedure: COLONOSCOPY WITH PROPOFOL ;  Surgeon: Marshall Skeeter, MD;  Location: [facility] END[facility];  Service: Endoscopy;  Laterality: N/A;   COLONOSCOPY WITH PROPOFOL  N/A 10/05/2021   Procedure: COLONOSCOPY WITH PROPOFOL ;  Surgeon: Marshall Skeeter, MD;  Location: [facility] ENDOSCOPY;  Service: Endoscopy;  Laterality: N/A;  HAS A PORT, NEEDS AMPICILLIN  PER OFFICE   DILATION AND CURETTAGE OF UTERUS     ENDOMETRIAL ABLATION  12/1991   ganglion cyst removal      IR BONE MARROW BIOPSY & ASPIRATION  08/08/2022   IR IMAGING GUIDED PORT INSERTION  10/27/2020   IR IMAGING GUIDED PORT INSERTION  04/04/2021   KYPHOPLASTY N/A 10/28/2020   Procedure: T12 and L3 KYPHOPLASTY;  Surgeon: Molli Angelucci, MD;  Location: [facility] ORS;  Service: Orthopedics;  Laterality: N/A;   TONSILLECTOMY     WRIST SURGERY Right 05/1995    FAMILY HISTORY: Family History  Problem Relation Age of Onset   Colon polyps  Sister    Colon cancer Father 4   Diabetes Mother    Breast cancer Neg Hx     ADVANCED DIRECTIVES (Y/N):  N  HEALTH MAINTENANCE: Social History   Tobacco Use   Smoking status: Former    Current packs/day: 0.00    Average packs/day: 1 pack/day for 4.0 years (4.0 ttl pk-yrs)    Types: Cigarettes    Start date: 53    Quit  date: 1982    Years since quitting: 43.4   Smokeless tobacco: Never  Vaping Use   Vaping status: Never Used  Substance Use Topics   Alcohol use: Yes    Alcohol/week: 1.0 - 2.0 standard drink of alcohol    Types: 1 - 2 Glasses of wine per week    Comment: occassionally   Drug use: No     Colonoscopy:  PAP:  Bone density:  Lipid panel:  No Known Allergies  Current Outpatient Medications  Medication Sig Dispense Refill   acetaminophen  (TYLENOL ) 325 MG tablet Take 650 mg by mouth 2 (two) times a week. Premed for velcade . Taking Tuesdays and Friday     acyclovir  (ZOVIRAX ) 400 MG tablet TAKE 1 TABLET BY MOUTH TWICE DAILY 60 tablet 5   albuterol (VENTOLIN HFA) 108 (90 Base) MCG/ACT inhaler Inhale 2 puffs into the lungs every 4 (four) hours as needed.     ALPRAZolam  (XANAX ) 0.5 MG tablet Take 1 tablet (0.5 mg total) by mouth at bedtime as needed for anxiety. TAKE 1 TABLET BY MOUTH AT BEDTIME AS NEEDED FOR ANXIETY 30 tablet 0   ascorbic acid (VITAMIN C) 500 MG tablet Take 500 mg by mouth daily.     ASPIRIN  81 PO Take 81 mg by mouth daily.     atorvastatin  (LIPITOR) 20 MG tablet Take 20 mg by mouth daily.     busPIRone  (BUSPAR ) 30 MG tablet Take 30 mg by mouth 2 (two) times daily.     Calcium  Carb-Cholecalciferol  (OYSTER SHELL CALCIUM /VITAMIN D) 500-5 MG-MCG PACK Take by mouth.     CALCIUM -VITAMIN D PO Take by mouth 2 (two) times daily.     cyclobenzaprine  (FLEXERIL ) 10 MG tablet TAKE 1 TABLET BY MOUTH AT BEDTIME 30 tablet 2   dexamethasone  (DECADRON ) 4 MG tablet Take 4 mg by mouth daily. 5 tabs po q28 days     diphenhydrAMINE  (BENADRYL ) 25 mg capsule Take 25 mg by mouth daily.     diphenoxylate -atropine  (LOMOTIL ) 2.5-0.025 MG tablet Take 1-2 tablets by mouth every 8 (eight) hours as needed for diarrhea or loose stools. 90 tablet 0   ferrous sulfate 325 (65 FE) MG tablet Take 325 mg by mouth daily with breakfast.     gabapentin  (NEURONTIN ) 300 MG capsule TAKE 1 CAPSULE BY MOUTH TWICE DAILY  60 capsule 2   ivabradine (CORLANOR) 5 MG TABS tablet Take 5 mg by mouth 2 (two) times daily with a meal.     lenalidomide  (REVLIMID ) 5 MG capsule TAKE 1 CAPSULE BY MOUTH 1 TIME A DAY FOR 21 DAYS ON THEN 7 DAYS OFF 21 capsule 0   lidocaine -prilocaine  (EMLA ) cream APPLY 1 APPLICATION TOPICALLY AS NEEDED 30 g 2   Magnesium  400 MG TABS Take 1 tablet by mouth daily.     meloxicam  (MOBIC ) 15 MG tablet Take 15 mg by mouth daily.     methocarbamol  (ROBAXIN ) 500 MG tablet Take 1 tablet (500 mg total) by mouth every 6 (six) hours as needed for muscle spasms. 40 tablet 1   MOUNJARO  7.5 MG/0.5ML Pen Inject 7.5 mg into the skin once a week.     Multiple Vitamin (MULTI-VITAMIN) tablet Take 1 tablet by mouth daily.     omeprazole (PRILOSEC) 40 MG capsule Take 40 mg by mouth daily.     ondansetron  (ZOFRAN ) 8 MG tablet Take 1 tablet (8 mg total) by mouth 2 (two) times daily as needed (Nausea or vomiting). 60 tablet 2   oxyCODONE -acetaminophen  (PERCOCET/ROXICET) 5-325 MG tablet TAKE 1-2 TABLETS BY MOUTH EVERY 4 HOURS AS NEEDED FOR SEVERE PAIN 90 tablet 0   predniSONE  (DELTASONE ) 10 MG tablet Take 1 tablet (10 mg total) by mouth daily. Take for 3 days along with start of every 28 day Revlimid  cycle. Take with food. 9 tablet 3   prochlorperazine  (COMPAZINE ) 10 MG tablet TAKE 1 TABLET BY MOUTH EVERY 6 HOURS AS NEEDED FOR NAUSEA OR VOMITING 60 tablet 2   traMADol  (ULTRAM ) 50 MG tablet TAKE 1 TABLET BY MOUTH EVERY 6 HOURS AS NEEDED FOR PAIN 60 tablet 1   traZODone  (DESYREL ) 50 MG tablet TAKE 1 TABLET BY MOUTH AT BEDTIME 30 tablet 1   No current facility-administered medications for this visit.   Facility-Administered Medications Ordered in Other Visits  Medication Dose Route Frequency Provider Last Rate Last Admin   0.9 %  sodium chloride  infusion   Intravenous Continuous Aasia Peavler J, MD   Stopped at 04/07/21 1142   heparin  lock flush 100 unit/mL  500 Units Intravenous Once Pau Banh J, MD        sodium chloride  flush (NS) 0.9 % injection 10 mL  10 mL Intravenous PRN Stanlee Roehrig J, MD   10 mL at 11/23/20 0932   sodium chloride  flush (NS) 0.9 % injection 10 mL  10 mL Intravenous PRN Noel Rodier J, MD   10 mL at 04/18/22 1417   sodium chloride  flush (NS) 0.9 % injection 10 mL  10 mL Intravenous Once Derion Kreiter J, MD        OBJECTIVE: Vitals:   07/26/23 1453  BP: 120/63  Pulse: 76  Resp: 16  Temp: 98.6 F (37 C)  SpO2: 100%       Body mass index is 22.41 kg/m.    ECOG FS:0 - Asymptomatic  General: Well-developed, well-nourished, no acute distress. Eyes: Pink conjunctiva, anicteric sclera. HEENT: Normocephalic, moist mucous membranes. Lungs: No audible wheezing or coughing. Heart: Regular rate and rhythm. Abdomen: Soft, nontender, no obvious distention. Musculoskeletal: No edema, cyanosis, or clubbing. Neuro: Alert, answering all questions appropriately. Cranial nerves grossly intact. Skin: No rashes or petechiae noted. Psych: Normal affect.  LAB RESULTS:  Lab Results  Component Value Date   NA 137 07/17/2023   K 3.0 (L) 07/17/2023   CL 107 07/17/2023   CO2 23 07/17/2023   GLUCOSE 125 (H) 07/17/2023   BUN 13 07/17/2023   CREATININE 1.36 (H) 07/17/2023   CALCIUM  8.3 (L) 07/17/2023   PROT 6.2 (L) 07/17/2023   ALBUMIN 3.7 07/17/2023   AST 29 07/17/2023   ALT 22 07/17/2023   ALKPHOS 69 07/17/2023   BILITOT 0.5 07/17/2023   GFRNONAA 43 (L) 07/17/2023    Lab Results  Component Value Date   WBC 1.8 (L) 07/17/2023   NEUTROABS 0.8 (L) 07/17/2023   HGB 11.4 (L) 07/17/2023   HCT 34.4 (L) 07/17/2023   MCV 101.2 (H) 07/17/2023   PLT 78 (L) 07/17/2023     STUDIES: NM PET Image Restage (PS) Whole Body Result Date: 07/25/2023 CLINICAL DATA:  Subsequent treatment  strategy for multiple myeloma. EXAM: NUCLEAR MEDICINE PET WHOLE BODY TECHNIQUE: 6.8 mCi F-18 FDG was injected intravenously. Full-ring PET imaging was performed from the head to foot  after the radiotracer. CT data was obtained and used for attenuation correction and anatomic localization. Fasting blood glucose: 104 mg/dl COMPARISON:  60/45/4098. FINDINGS: Mediastinal blood pool activity: SUV max 2.7 HEAD/NECK: No abnormal hypermetabolism. Incidental CT findings: None. CHEST: No abnormal hypermetabolism. Incidental CT findings: Right IJ Port-A-Cath terminates at the SVC RA junction. Heart is enlarged. No pericardial or pleural effusion. ABDOMEN/PELVIS: No abnormal hypermetabolism. Incidental CT findings: Liver, gallbladder, adrenal glands, kidneys, spleen, pancreas, stomach and bowel are grossly unremarkable. Small bilateral inguinal hernias contain fat. SKELETON: Facet uptake in the cervical spine is presumably degenerative in etiology. Scattered mild hypermetabolism associated with old rib fractures, index SUV max on the left, 2.8. Likely degenerative uptake in the feet. Lytic lesion in the superior right iliac wing, SUV max 2.9, similar. Incidental CT findings: Degenerative changes in the spine.  Vertebral body augmentations. EXTREMITIES: No additional abnormal hypermetabolism. Incidental CT findings: None. IMPRESSION: Similar borderline hypermetabolic lytic lesion in the right iliac wing. No evidence of disease progression. Electronically Signed   By: Shearon Denis M.D.   On: 07/25/2023 09:02    ONCOLOGY HISTORY:  Diagnosis confirmed from bone biopsy on August 17, 2020.  Initially her lambda free light chains were elevated at 432.1, but now are within normal limits at 12.1.  Immunoglobulins and SPEP are normal.  Bone marrow biopsy completed on August 26, 2020 revealed 25% plasma cells along with the deletion of 1p which is considered poor prognosis.  PET scan results from September 06, 2020 reviewed independently with 3 hypermetabolic lesions in right C5-6 facets, right iliac crest lesion, and L3 vertebral lesion.  After lengthy discussion with the patient, she agreed to pursue chemotherapy with  daratumumab , Velcade , Revlimid , and dexamethasone  followed by autologous bone marrow transplant.  Patient received weekly daratumumab  for 4 cycles along with Velcade  on days 1, 4, 8, and 11.  She also received Revlimid  on days 1 through 14 of a 21-day cycle.  She completed cycle 4 of treatment on January 14, 2021.  Subsequent bone marrow biopsy on January 24, 2021 revealed complete remission with no evidence of myeloma.  Patient underwent autologous stem cell transplant at [facility] on February 24, 2021.  She was readmitted to the hospital on March 04, 2021 with neutropenic fever, diarrhea, and sepsis-like symptoms.  Infectious disease work-up was negative.   Patient previously received consolidation treatment with the GRIFFIN regimen.  She received consolidation with cycle 5 and 6 using daratumumab  on day 1, Velcade  on day 1, 4, 8, and 11, Revlimid  10 mg on days 1 through 14 with 7 days off, and weekly dexamethasone  on a 21-day cycle.  She was on maintenance daratumumab  on day 1 and Revlimid  10 mg on days 1 through 21 on a 28-day cycle for cycles 7 through 32.  Her most recent PET scan on July 31, 2022 reviewed independently and report as above with no obvious evidence of recurrent disease.  A second bone marrow biopsy was completed on August 08, 2022 which also revealed no evidence of disease with normal cytogenetics.    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  PLAN:      Lambda light chain myeloma with 1p del:  Given her persistent pancytopenia, maintenance Revlimid  was discontinued in approximately July 2024 and then reinitiated in November 2024.  Her most recent bone marrow biopsy at [facility] on November 07, 2022 reported complete remission.  Continue dose reduced to 5 mg Revlimid  daily for 21 days with 7 days off.  Will not increase dose of Revlimid  at this time.  Patient now receives Zometa  every 3 months.  Her last dose was on May 28, 2023.  PET  scan results from 10/17/2023 revealed no evidence of disease.  Return to clinic on August 20, 2023 for further evaluation and continuation of treatment. Thrombocytopenia: Chronic and unchanged.  Patient's platelet count has trended down slightly to 78.  Continue low-dose Revlimid  as above.  Anemia: Chronic and unchanged.   Leukopenia: Total blood cell count has trended down slightly to 1.8.  Monitor.  Proceed with Revlimid  as above. Hypocalcemia: Mild.   Renal insufficiency: GFR is slightly worse at 43.  Monitor.   Vaccinations: Continue revaccination schedule as per [facility].  Patient has follow-up on August 22, 2023. Pulmonary nodule: Resolved. Left knee pain: Patient does not complain of this today.  Continue conservative management and injections as needed per orthopedics. Hand pain: Continue conservative management and injections as needed per orthopedics Neuropathy: Patient now taking gabapentin .  Continue follow-up with neurology as scheduled.   Rash: Possibly related to Revlimid .  Continue dose reduction as above.  Patient takes and is on 10 mg daily for 3 days at the beginning of each cycle.  Patient expressed understanding and was in agreement with this plan. She also understands that She can call clinic at any time with any questions, concerns, or complaints.      Cancer Staging  Lambda light chain myeloma (HCC) Staging form: Plasma Cell Myeloma and Plasma Cell Disorders, AJCC 8th Edition - Clinical stage from 09/16/2020: RISS Stage I (Beta-2 -microglobulin (mg/L): 2.5, Albumin (g/dL): 4.9, ISS: Stage I, High-risk cytogenetics: Absent, LDH: Normal) - Signed by Shellie Dials, MD on 09/16/2020 Beta 2 microglobulin range (mg/L): Less than 3.5 Albumin range (g/dL): Greater than or equal to 3.5 Cytogenetics: 1p deletion  Shellie Dials, MD   07/26/2023 3:09 PM

## 2023-07-28 DIAGNOSIS — F4323 Adjustment disorder with mixed anxiety and depressed mood: Secondary | ICD-10-CM | POA: Diagnosis not present

## 2023-07-28 DIAGNOSIS — C9000 Multiple myeloma not having achieved remission: Secondary | ICD-10-CM | POA: Diagnosis not present

## 2023-08-07 ENCOUNTER — Other Ambulatory Visit: Payer: Self-pay | Admitting: Oncology

## 2023-08-07 ENCOUNTER — Ambulatory Visit: Admitting: Oncology

## 2023-08-07 DIAGNOSIS — C9000 Multiple myeloma not having achieved remission: Secondary | ICD-10-CM

## 2023-08-20 ENCOUNTER — Ambulatory Visit: Admitting: Oncology

## 2023-08-20 ENCOUNTER — Inpatient Hospital Stay: Attending: Oncology

## 2023-08-20 ENCOUNTER — Inpatient Hospital Stay (HOSPITAL_BASED_OUTPATIENT_CLINIC_OR_DEPARTMENT_OTHER): Admitting: Oncology

## 2023-08-20 ENCOUNTER — Encounter: Payer: Self-pay | Admitting: Oncology

## 2023-08-20 ENCOUNTER — Inpatient Hospital Stay: Admitting: Pharmacist

## 2023-08-20 ENCOUNTER — Ambulatory Visit: Admitting: Pharmacist

## 2023-08-20 ENCOUNTER — Inpatient Hospital Stay

## 2023-08-20 ENCOUNTER — Other Ambulatory Visit

## 2023-08-20 VITALS — BP 123/64 | HR 78 | Temp 99.3°F | Resp 14 | Wt 120.0 lb

## 2023-08-20 DIAGNOSIS — C9000 Multiple myeloma not having achieved remission: Secondary | ICD-10-CM

## 2023-08-20 DIAGNOSIS — C9001 Multiple myeloma in remission: Secondary | ICD-10-CM | POA: Diagnosis present

## 2023-08-20 DIAGNOSIS — D693 Immune thrombocytopenic purpura: Secondary | ICD-10-CM | POA: Insufficient documentation

## 2023-08-20 DIAGNOSIS — Z79899 Other long term (current) drug therapy: Secondary | ICD-10-CM | POA: Insufficient documentation

## 2023-08-20 DIAGNOSIS — D649 Anemia, unspecified: Secondary | ICD-10-CM | POA: Insufficient documentation

## 2023-08-20 DIAGNOSIS — D72819 Decreased white blood cell count, unspecified: Secondary | ICD-10-CM | POA: Diagnosis not present

## 2023-08-20 DIAGNOSIS — Z87891 Personal history of nicotine dependence: Secondary | ICD-10-CM | POA: Insufficient documentation

## 2023-08-20 DIAGNOSIS — N289 Disorder of kidney and ureter, unspecified: Secondary | ICD-10-CM | POA: Diagnosis not present

## 2023-08-20 DIAGNOSIS — Z9484 Stem cells transplant status: Secondary | ICD-10-CM | POA: Diagnosis not present

## 2023-08-20 LAB — CBC WITH DIFFERENTIAL/PLATELET
Abs Immature Granulocytes: 0 10*3/uL (ref 0.00–0.07)
Basophils Absolute: 0 10*3/uL (ref 0.0–0.1)
Basophils Relative: 1 %
Eosinophils Absolute: 0 10*3/uL (ref 0.0–0.5)
Eosinophils Relative: 0 %
HCT: 35.2 % — ABNORMAL LOW (ref 36.0–46.0)
Hemoglobin: 11.8 g/dL — ABNORMAL LOW (ref 12.0–15.0)
Immature Granulocytes: 0 %
Lymphocytes Relative: 32 %
Lymphs Abs: 1 10*3/uL (ref 0.7–4.0)
MCH: 34.7 pg — ABNORMAL HIGH (ref 26.0–34.0)
MCHC: 33.5 g/dL (ref 30.0–36.0)
MCV: 103.5 fL — ABNORMAL HIGH (ref 80.0–100.0)
Monocytes Absolute: 0.5 10*3/uL (ref 0.1–1.0)
Monocytes Relative: 16 %
Neutro Abs: 1.6 10*3/uL — ABNORMAL LOW (ref 1.7–7.7)
Neutrophils Relative %: 51 %
Platelets: 113 10*3/uL — ABNORMAL LOW (ref 150–400)
RBC: 3.4 MIL/uL — ABNORMAL LOW (ref 3.87–5.11)
RDW: 14.5 % (ref 11.5–15.5)
WBC: 3.1 10*3/uL — ABNORMAL LOW (ref 4.0–10.5)
nRBC: 0 % (ref 0.0–0.2)

## 2023-08-20 LAB — CMP (CANCER CENTER ONLY)
ALT: 25 U/L (ref 0–44)
AST: 27 U/L (ref 15–41)
Albumin: 3.9 g/dL (ref 3.5–5.0)
Alkaline Phosphatase: 58 U/L (ref 38–126)
Anion gap: 7 (ref 5–15)
BUN: 16 mg/dL (ref 8–23)
CO2: 23 mmol/L (ref 22–32)
Calcium: 9 mg/dL (ref 8.9–10.3)
Chloride: 109 mmol/L (ref 98–111)
Creatinine: 1.18 mg/dL — ABNORMAL HIGH (ref 0.44–1.00)
GFR, Estimated: 51 mL/min — ABNORMAL LOW (ref >60)
Glucose, Bld: 132 mg/dL — ABNORMAL HIGH (ref 70–99)
Potassium: 3.8 mmol/L (ref 3.5–5.1)
Sodium: 139 mmol/L (ref 135–145)
Total Bilirubin: 0.5 mg/dL (ref 0.0–1.2)
Total Protein: 6.3 g/dL — ABNORMAL LOW (ref 6.5–8.1)

## 2023-08-20 MED ORDER — ZOLEDRONIC ACID 4 MG/5ML IV CONC
3.0000 mg | Freq: Once | INTRAVENOUS | Status: AC
  Administered 2023-08-20: 3 mg via INTRAVENOUS
  Filled 2023-08-20: qty 3.75

## 2023-08-20 MED ORDER — SODIUM CHLORIDE 0.9 % IV SOLN
INTRAVENOUS | Status: DC
  Filled 2023-08-20: qty 250

## 2023-08-20 NOTE — Progress Notes (Signed)
 Clinical Pharmacist Practitioner [facility]  [phone] [phone]  Patient Care Team: Corlis Honor BROCKS, MD as PCP - General (Internal Medicine)   Name of the patient: Hannah Mcdonald  [id number]  1955/07/14   Date of visit: 08/20/23  HPI: Patient is a 68 y.o. female with lambda light chain myeloma s/p transplant. Maintenance lenalidomide  was held due to pancytopenia, but with count improvement patient restarted lenalidomide  in Nov 2024.   Reason for Consult: Oral chemotherapy follow-up for lenalidomide  therapy.   PAST MEDICAL HISTORY: Past Medical History:  Diagnosis Date   Anxiety    Diabetes mellitus without complication (HCC) 2016   GERD (gastroesophageal reflux disease)    Hyperlipidemia    Myeloma (HCC)    Myeloma (HCC)    Personal history of colonic polyps    Squamous cell carcinoma of skin 11/25/2013   Left chest. WD SCC with superficial infiltration.   Tachycardia     HEMATOLOGY/ONCOLOGY HISTORY:  Oncology History  Lambda light chain myeloma (HCC)  08/24/2020 Initial Diagnosis   Lambda light chain myeloma (HCC)   09/16/2020 Cancer Staging   Staging form: Plasma Cell Myeloma and Plasma Cell Disorders, AJCC 8th Edition - Clinical stage from 09/16/2020: RISS Stage I (Beta-2 -microglobulin (mg/L): 2.5, Albumin (g/dL): 4.9, ISS: Stage I, High-risk cytogenetics: Absent, LDH: Normal) - Signed by Jacobo Evalene PARAS, MD on 09/16/2020 Beta 2 microglobulin range (mg/L): Less than 3.5 Albumin range (g/dL): Greater than or equal to 3.5 Cytogenetics: 1p deletion   11/02/2020 - 07/22/2021 Chemotherapy   Patient is on Treatment Plan : POST TRANSPLANT DaraVRd (Daratumumab  SQ) q21d x 2 Cycles (Consolidation)- cycle 5&6     08/02/2021 - 09/27/2021 Chemotherapy   Patient is on Treatment Plan : MYELOMA Daratumumab  SQ q28d- Maintainance treatment w/ revlimid      08/02/2021 - 08/15/2022 Chemotherapy   Patient is on Treatment Plan : MYELOMA  POST-TRANSPLANT MAINTENANE Daratumumab  SQ + Lenalidomide  q28d (Maintenance)       ALLERGIES:  has no known allergies.  MEDICATIONS:  Current Outpatient Medications  Medication Sig Dispense Refill   acetaminophen  (TYLENOL ) 325 MG tablet Take 650 mg by mouth 2 (two) times a week. Premed for velcade . Taking Tuesdays and Friday     acyclovir  (ZOVIRAX ) 400 MG tablet TAKE 1 TABLET BY MOUTH TWICE DAILY 60 tablet 5   albuterol (VENTOLIN HFA) 108 (90 Base) MCG/ACT inhaler Inhale 2 puffs into the lungs every 4 (four) hours as needed.     ALPRAZolam  (XANAX ) 0.5 MG tablet Take 1 tablet (0.5 mg total) by mouth at bedtime as needed for anxiety. TAKE 1 TABLET BY MOUTH AT BEDTIME AS NEEDED FOR ANXIETY 30 tablet 0   ascorbic acid (VITAMIN C) 500 MG tablet Take 500 mg by mouth daily.     ASPIRIN  81 PO Take 81 mg by mouth daily.     atorvastatin  (LIPITOR) 20 MG tablet Take 20 mg by mouth daily.     busPIRone  (BUSPAR ) 30 MG tablet Take 30 mg by mouth 2 (two) times daily.     Calcium  Carb-Cholecalciferol  (OYSTER SHELL CALCIUM /VITAMIN D) 500-5 MG-MCG PACK Take by mouth.     CALCIUM -VITAMIN D PO Take by mouth 2 (two) times daily.     cyclobenzaprine  (FLEXERIL ) 10 MG tablet TAKE 1 TABLET BY MOUTH AT BEDTIME 30 tablet 2   dexamethasone  (DECADRON ) 4 MG tablet Take 4 mg by mouth daily. 5 tabs po q28 days     diphenhydrAMINE  (BENADRYL ) 25 mg capsule Take 25 mg by mouth daily.  diphenoxylate -atropine  (LOMOTIL ) 2.5-0.025 MG tablet Take 1-2 tablets by mouth every 8 (eight) hours as needed for diarrhea or loose stools. 90 tablet 0   DULoxetine (CYMBALTA) 20 MG capsule Take 20 mg by mouth.     ferrous sulfate 325 (65 FE) MG tablet Take 325 mg by mouth daily with breakfast.     gabapentin  (NEURONTIN ) 300 MG capsule TAKE 1 CAPSULE BY MOUTH TWICE DAILY 60 capsule 2   gabapentin  (NEURONTIN ) 400 MG capsule Take 400 mg by mouth. 400 mg in morning 400 mg at noon 1200 mg at bedtime     ivabradine (CORLANOR) 5 MG TABS tablet  Take 5 mg by mouth 2 (two) times daily with a meal.     lenalidomide  (REVLIMID ) 5 MG capsule TAKE 1 CAPSULE BY MOUTH 1 TIME A DAY FOR 21 DAYS ON THEN 7 DAYS OFF 21 capsule 0   lidocaine -prilocaine  (EMLA ) cream APPLY 1 APPLICATION TOPICALLY AS NEEDED 30 g 2   Magnesium  400 MG TABS Take 1 tablet by mouth daily.     meloxicam  (MOBIC ) 15 MG tablet Take 15 mg by mouth daily.     methocarbamol  (ROBAXIN ) 500 MG tablet Take 1 tablet (500 mg total) by mouth every 6 (six) hours as needed for muscle spasms. 40 tablet 1   MOUNJARO 7.5 MG/0.5ML Pen Inject 7.5 mg into the skin once a week.     Multiple Vitamin (MULTI-VITAMIN) tablet Take 1 tablet by mouth daily.     omeprazole (PRILOSEC) 40 MG capsule Take 40 mg by mouth daily.     ondansetron  (ZOFRAN ) 8 MG tablet Take 1 tablet (8 mg total) by mouth 2 (two) times daily as needed (Nausea or vomiting). 60 tablet 2   oxyCODONE -acetaminophen  (PERCOCET/ROXICET) 5-325 MG tablet TAKE 1-2 TABLETS BY MOUTH EVERY 4 HOURS AS NEEDED FOR SEVERE PAIN 90 tablet 0   predniSONE  (DELTASONE ) 10 MG tablet Take 1 tablet (10 mg total) by mouth daily. Take for 3 days along with start of every 28 day Revlimid  cycle. Take with food. (Patient not taking: Reported on 08/20/2023) 9 tablet 3   prochlorperazine  (COMPAZINE ) 10 MG tablet TAKE 1 TABLET BY MOUTH EVERY 6 HOURS AS NEEDED FOR NAUSEA OR VOMITING 60 tablet 2   traMADol  (ULTRAM ) 50 MG tablet TAKE 1 TABLET BY MOUTH EVERY 6 HOURS AS NEEDED FOR PAIN 60 tablet 1   traZODone  (DESYREL ) 50 MG tablet TAKE 1 TABLET BY MOUTH AT BEDTIME 30 tablet 1   No current facility-administered medications for this visit.   Facility-Administered Medications Ordered in Other Visits  Medication Dose Route Frequency Provider Last Rate Last Admin   0.9 %  sodium chloride  infusion   Intravenous Continuous Finnegan, Timothy J, MD   Stopped at 04/07/21 1142   0.9 %  sodium chloride  infusion   Intravenous Continuous Finnegan, Timothy J, MD 40 mL/hr at 08/20/23  1603 New Bag at 08/20/23 1603   heparin  lock flush 100 unit/mL  500 Units Intravenous Once Finnegan, Timothy J, MD       sodium chloride  flush (NS) 0.9 % injection 10 mL  10 mL Intravenous PRN Finnegan, Timothy J, MD   10 mL at 11/23/20 0932   sodium chloride  flush (NS) 0.9 % injection 10 mL  10 mL Intravenous PRN Finnegan, Timothy J, MD   10 mL at 04/18/22 1417   sodium chloride  flush (NS) 0.9 % injection 10 mL  10 mL Intravenous Once Finnegan, Timothy J, MD       zoledronic  acid (ZOMETA ) 3  mg in sodium chloride  0.9 % 100 mL IVPB  3 mg Intravenous Once Finnegan, Timothy J, MD        VITAL SIGNS: There were no vitals taken for this visit. There were no vitals filed for this visit.  Estimated body mass index is 21.95 kg/m as calculated from the following:   Height as of 07/26/23: 5' 2 (1.575 m).   Weight as of an earlier encounter on 08/20/23: 54.4 kg (120 lb).  LABS: CBC:    Component Value Date/Time   WBC 3.1 (L) 08/20/2023 1403   HGB 11.8 (L) 08/20/2023 1403   HGB 11.4 (L) 07/17/2023 0909   HCT 35.2 (L) 08/20/2023 1403   PLT 113 (L) 08/20/2023 1403   PLT 78 (L) 07/17/2023 0909   MCV 103.5 (H) 08/20/2023 1403   NEUTROABS 1.6 (L) 08/20/2023 1403   LYMPHSABS 1.0 08/20/2023 1403   MONOABS 0.5 08/20/2023 1403   EOSABS 0.0 08/20/2023 1403   BASOSABS 0.0 08/20/2023 1403   Comprehensive Metabolic Panel:    Component Value Date/Time   NA 139 08/20/2023 1403   K 3.8 08/20/2023 1403   CL 109 08/20/2023 1403   CO2 23 08/20/2023 1403   BUN 16 08/20/2023 1403   CREATININE 1.18 (H) 08/20/2023 1403   GLUCOSE 132 (H) 08/20/2023 1403   CALCIUM  9.0 08/20/2023 1403   AST 27 08/20/2023 1403   ALT 25 08/20/2023 1403   ALKPHOS 58 08/20/2023 1403   BILITOT 0.5 08/20/2023 1403   PROT 6.3 (L) 08/20/2023 1403   ALBUMIN 3.9 08/20/2023 1403     Present during today's visit: patient only  Assessment and Plan: CMP/CBC reviewed, patient to continue lenalidomide  5mg  21 on/7 off Patient  restarted her lenalidomide  yesterday 08/19/23   Oral Chemotherapy Side Effect/Intolerance:  Rash: managed currently with diphenhydramine  and prednisone  No other side effects noted   Oral Chemotherapy Adherence: No missed doses reported No patient barriers to medication adherence identified.   New medications: no new meds  Medication Access Issues: No issues, patient fills medication at [facility]  Patient expressed understanding and was in agreement with this plan. She also understands that She can call clinic at any time with any questions, concerns, or complaints.   Follow-up plan: RTC in 4 weeks  Thank you for allowing me to participate in the care of this very pleasant patient.   Time Total: 15 mins  Visit consisted of counseling and education on dealing with issues of symptom management in the setting of serious and potentially life-threatening illness.Greater than 50%  of this time was spent counseling and coordinating care related to the above assessment and plan.  Signed by: Vidur Knust N. Acelynn Dejonge, PharmD, BCPS, NEILA, CPP Hematology/Oncology Clinical Pharmacist Practitioner [facility] [phone]  08/20/2023 4:04 PM

## 2023-08-20 NOTE — Progress Notes (Signed)
 [facility]  [phone] [phone]  ID: Hannah Mcdonald OB: March 23, 1955  MR#: [id number]  RDW#:[id number]  Patient Care Team: Corlis Honor BROCKS, MD as PCP - General (Internal Medicine)   CHIEF COMPLAINT: Lambda light chain myeloma with 1p del, in remission.  ITP.  INTERVAL HISTORY: Patient returns to clinic today for repeat laboratory work and continuation of Revlimid .  She continues to feel well and remains asymptomatic.  She is tolerating her treatment without significant side effects.  Does not complain of peripheral neuropathy today.  She has no other neurologic complaints.  She denies any recent fevers or illnesses.  She denies any chest pain, shortness of breath, cough, or hemoptysis.  She denies any nausea, vomiting, constipation, or diarrhea.  She has no urinary complaints.  Patient offers no specific complaints today.  REVIEW OF SYSTEMS:   Review of Systems  Constitutional: Negative.  Negative for fever, malaise/fatigue and weight loss.  HENT: Negative.  Negative for congestion and sinus pain.   Respiratory: Negative.  Negative for cough, hemoptysis and shortness of breath.   Cardiovascular:  Negative for chest pain and leg swelling.  Gastrointestinal: Negative.  Negative for abdominal pain, diarrhea and nausea.  Genitourinary: Negative.  Negative for dysuria.  Musculoskeletal: Negative.  Negative for back pain, joint pain and neck pain.  Skin: Negative.  Negative for rash.  Neurological:  Positive for sensory change. Negative for dizziness, tremors, focal weakness, weakness and headaches.  Endo/Heme/Allergies: Negative.  Does not bruise/bleed easily.  Psychiatric/Behavioral: Negative.  The patient is not nervous/anxious and does not have insomnia.     As per HPI. Otherwise, a complete review of systems is negative.  PAST MEDICAL HISTORY: Past Medical History:  Diagnosis Date   Anxiety    Diabetes mellitus without complication (HCC) 2016    GERD (gastroesophageal reflux disease)    Hyperlipidemia    Myeloma (HCC)    Myeloma (HCC)    Personal history of colonic polyps    Squamous cell carcinoma of skin 11/25/2013   Left chest. WD SCC with superficial infiltration.   Tachycardia     PAST SURGICAL HISTORY: Past Surgical History:  Procedure Laterality Date   AUGMENTATION MAMMAPLASTY Bilateral 1983   silicone and replacement in 2001   BREAST ENHANCEMENT SURGERY  1983   CESAREAN SECTION  1987, 1989   COLONOSCOPY  1988, 1998,2003, 2008, 2013   COLONOSCOPY WITH PROPOFOL  N/A 08/16/2016   Procedure: COLONOSCOPY WITH PROPOFOL ;  Surgeon: Dessa Reyes ORN, MD;  Location: [facility] ENDOSCOPY;  Service: Endoscopy;  Laterality: N/A;   COLONOSCOPY WITH PROPOFOL  N/A 10/05/2021   Procedure: COLONOSCOPY WITH PROPOFOL ;  Surgeon: Dessa Reyes ORN, MD;  Location: [facility] ENDOSCOPY;  Service: Endoscopy;  Laterality: N/A;  HAS A PORT, NEEDS AMPICILLIN  PER OFFICE   DILATION AND CURETTAGE OF UTERUS     ENDOMETRIAL ABLATION  12/1991   ganglion cyst removal      IR BONE MARROW BIOPSY & ASPIRATION  08/08/2022   IR IMAGING GUIDED PORT INSERTION  10/27/2020   IR IMAGING GUIDED PORT INSERTION  04/04/2021   KYPHOPLASTY N/A 10/28/2020   Procedure: T12 and L3 KYPHOPLASTY;  Surgeon: Kathlynn Sharper, MD;  Location: [facility] ORS;  Service: Orthopedics;  Laterality: N/A;   TONSILLECTOMY     WRIST SURGERY Right 05/1995    FAMILY HISTORY: Family History  Problem Relation Age of Onset   Colon polyps Sister    Colon cancer Father 19   Diabetes Mother    Breast cancer Neg Hx  ADVANCED DIRECTIVES (Y/N):  N  HEALTH MAINTENANCE: Social History   Tobacco Use   Smoking status: Former    Current packs/day: 0.00    Average packs/day: 1 pack/day for 4.0 years (4.0 ttl pk-yrs)    Types: Cigarettes    Start date: 64    Quit date: 1982    Years since quitting: 43.5   Smokeless tobacco: Never  Vaping Use   Vaping status: Never Used  Substance Use Topics    Alcohol use: Yes    Alcohol/week: 1.0 - 2.0 standard drink of alcohol    Types: 1 - 2 Glasses of wine per week    Comment: occassionally   Drug use: No     Colonoscopy:  PAP:  Bone density:  Lipid panel:  No Known Allergies  Current Outpatient Medications  Medication Sig Dispense Refill   acetaminophen  (TYLENOL ) 325 MG tablet Take 650 mg by mouth 2 (two) times a week. Premed for velcade . Taking Tuesdays and Friday     acyclovir  (ZOVIRAX ) 400 MG tablet TAKE 1 TABLET BY MOUTH TWICE DAILY 60 tablet 5   albuterol (VENTOLIN HFA) 108 (90 Base) MCG/ACT inhaler Inhale 2 puffs into the lungs every 4 (four) hours as needed.     ALPRAZolam  (XANAX ) 0.5 MG tablet Take 1 tablet (0.5 mg total) by mouth at bedtime as needed for anxiety. TAKE 1 TABLET BY MOUTH AT BEDTIME AS NEEDED FOR ANXIETY 30 tablet 0   ascorbic acid (VITAMIN C) 500 MG tablet Take 500 mg by mouth daily.     ASPIRIN  81 PO Take 81 mg by mouth daily.     atorvastatin  (LIPITOR) 20 MG tablet Take 20 mg by mouth daily.     busPIRone  (BUSPAR ) 30 MG tablet Take 30 mg by mouth 2 (two) times daily.     Calcium  Carb-Cholecalciferol  (OYSTER SHELL CALCIUM /VITAMIN D) 500-5 MG-MCG PACK Take by mouth.     CALCIUM -VITAMIN D PO Take by mouth 2 (two) times daily.     cyclobenzaprine  (FLEXERIL ) 10 MG tablet TAKE 1 TABLET BY MOUTH AT BEDTIME 30 tablet 2   diphenhydrAMINE  (BENADRYL ) 25 mg capsule Take 25 mg by mouth daily.     diphenoxylate -atropine  (LOMOTIL ) 2.5-0.025 MG tablet Take 1-2 tablets by mouth every 8 (eight) hours as needed for diarrhea or loose stools. 90 tablet 0   DULoxetine (CYMBALTA) 20 MG capsule Take 20 mg by mouth.     ferrous sulfate 325 (65 FE) MG tablet Take 325 mg by mouth daily with breakfast.     gabapentin  (NEURONTIN ) 400 MG capsule Take 400 mg by mouth. 400 mg in morning 400 mg at noon 1200 mg at bedtime     ivabradine (CORLANOR) 5 MG TABS tablet Take 5 mg by mouth 2 (two) times daily with a meal.     lenalidomide   (REVLIMID ) 5 MG capsule TAKE 1 CAPSULE BY MOUTH 1 TIME A DAY FOR 21 DAYS ON THEN 7 DAYS OFF 21 capsule 0   lidocaine -prilocaine  (EMLA ) cream APPLY 1 APPLICATION TOPICALLY AS NEEDED 30 g 2   Magnesium  400 MG TABS Take 1 tablet by mouth daily.     meloxicam  (MOBIC ) 15 MG tablet Take 15 mg by mouth daily.     methocarbamol  (ROBAXIN ) 500 MG tablet Take 1 tablet (500 mg total) by mouth every 6 (six) hours as needed for muscle spasms. 40 tablet 1   MOUNJARO 7.5 MG/0.5ML Pen Inject 7.5 mg into the skin once a week.     Multiple Vitamin (MULTI-VITAMIN) tablet  Take 1 tablet by mouth daily.     omeprazole (PRILOSEC) 40 MG capsule Take 40 mg by mouth daily.     ondansetron  (ZOFRAN ) 8 MG tablet Take 1 tablet (8 mg total) by mouth 2 (two) times daily as needed (Nausea or vomiting). 60 tablet 2   oxyCODONE -acetaminophen  (PERCOCET/ROXICET) 5-325 MG tablet TAKE 1-2 TABLETS BY MOUTH EVERY 4 HOURS AS NEEDED FOR SEVERE PAIN 90 tablet 0   prochlorperazine  (COMPAZINE ) 10 MG tablet TAKE 1 TABLET BY MOUTH EVERY 6 HOURS AS NEEDED FOR NAUSEA OR VOMITING 60 tablet 2   traMADol  (ULTRAM ) 50 MG tablet TAKE 1 TABLET BY MOUTH EVERY 6 HOURS AS NEEDED FOR PAIN 60 tablet 1   traZODone  (DESYREL ) 50 MG tablet TAKE 1 TABLET BY MOUTH AT BEDTIME 30 tablet 1   dexamethasone  (DECADRON ) 4 MG tablet Take 4 mg by mouth daily. 5 tabs po q28 days     gabapentin  (NEURONTIN ) 300 MG capsule TAKE 1 CAPSULE BY MOUTH TWICE DAILY 60 capsule 2   predniSONE  (DELTASONE ) 10 MG tablet Take 1 tablet (10 mg total) by mouth daily. Take for 3 days along with start of every 28 day Revlimid  cycle. Take with food. (Patient not taking: Reported on 08/20/2023) 9 tablet 3   No current facility-administered medications for this visit.   Facility-Administered Medications Ordered in Other Visits  Medication Dose Route Frequency Provider Last Rate Last Admin   0.9 %  sodium chloride  infusion   Intravenous Continuous Marvon Shillingburg J, MD   Stopped at 04/07/21 1142    heparin  lock flush 100 unit/mL  500 Units Intravenous Once Desmund Elman J, MD       sodium chloride  flush (NS) 0.9 % injection 10 mL  10 mL Intravenous PRN Christoher Drudge J, MD   10 mL at 11/23/20 0932   sodium chloride  flush (NS) 0.9 % injection 10 mL  10 mL Intravenous PRN Tehani Mersman J, MD   10 mL at 04/18/22 1417   sodium chloride  flush (NS) 0.9 % injection 10 mL  10 mL Intravenous Once Charleston Vierling J, MD        OBJECTIVE: Vitals:   08/20/23 1444  BP: 123/64  Pulse: 78  Resp: 14  Temp: 99.3 F (37.4 C)  SpO2: 99%      Body mass index is 21.95 kg/m.    ECOG FS:0 - Asymptomatic  General: Well-developed, well-nourished, no acute distress. Eyes: Pink conjunctiva, anicteric sclera. HEENT: Normocephalic, moist mucous membranes. Lungs: No audible wheezing or coughing. Heart: Regular rate and rhythm. Abdomen: Soft, nontender, no obvious distention. Musculoskeletal: No edema, cyanosis, or clubbing. Neuro: Alert, answering all questions appropriately. Cranial nerves grossly intact. Skin: No rashes or petechiae noted. Psych: Normal affect.  LAB RESULTS:  Lab Results  Component Value Date   NA 139 08/20/2023   K 3.8 08/20/2023   CL 109 08/20/2023   CO2 23 08/20/2023   GLUCOSE 132 (H) 08/20/2023   BUN 16 08/20/2023   CREATININE 1.18 (H) 08/20/2023   CALCIUM  9.0 08/20/2023   PROT 6.3 (L) 08/20/2023   ALBUMIN 3.9 08/20/2023   AST 27 08/20/2023   ALT 25 08/20/2023   ALKPHOS 58 08/20/2023   BILITOT 0.5 08/20/2023   GFRNONAA 51 (L) 08/20/2023    Lab Results  Component Value Date   WBC 3.1 (L) 08/20/2023   NEUTROABS 1.6 (L) 08/20/2023   HGB 11.8 (L) 08/20/2023   HCT 35.2 (L) 08/20/2023   MCV 103.5 (H) 08/20/2023   PLT 113 (L) 08/20/2023  STUDIES: No results found.   ONCOLOGY HISTORY:  Diagnosis confirmed from bone biopsy on August 17, 2020.  Initially her lambda free light chains were elevated at 432.1, but now are within normal limits at  12.1.  Immunoglobulins and SPEP are normal.  Bone marrow biopsy completed on August 26, 2020 revealed 25% plasma cells along with the deletion of 1p which is considered poor prognosis.  PET scan results from September 06, 2020 reviewed independently with 3 hypermetabolic lesions in right C5-6 facets, right iliac crest lesion, and L3 vertebral lesion.  After lengthy discussion with the patient, she agreed to pursue chemotherapy with daratumumab , Velcade , Revlimid , and dexamethasone  followed by autologous bone marrow transplant.  Patient received weekly daratumumab  for 4 cycles along with Velcade  on days 1, 4, 8, and 11.  She also received Revlimid  on days 1 through 14 of a 21-day cycle.  She completed cycle 4 of treatment on January 14, 2021.  Subsequent bone marrow biopsy on January 24, 2021 revealed complete remission with no evidence of myeloma.  Patient underwent autologous stem cell [facility] at [facility] on February 24, 2021.  She was readmitted to the hospital on March 04, 2021 with neutropenic fever, diarrhea, and sepsis-like symptoms.  Infectious disease work-up was negative.   Patient previously received consolidation treatment with the GRIFFIN regimen.  She received consolidation with cycle 5 and 6 using daratumumab  on day 1, Velcade  on day 1, 4, 8, and 11, Revlimid  10 mg on days 1 through 14 with 7 days off, and weekly dexamethasone  on a 21-day cycle.  She was on maintenance daratumumab  on day 1 and Revlimid  10 mg on days 1 through 21 on a 28-day cycle for cycles 7 through 32.  Her most recent PET scan on July 31, 2022 reviewed independently and report as above with no obvious evidence of recurrent disease.  A second bone marrow biopsy was completed on August 08, 2022 which also revealed no evidence of disease with normal cytogenetics.    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  PLAN:      Lambda light chain myeloma with 1p del:  Given her persistent  pancytopenia, maintenance Revlimid  was discontinued in approximately July 2024 and then reinitiated in November 2024.  Her most recent bone marrow biopsy at [facility] on November 07, 2022 reported complete remission.  PET scan results from Jul 17, 2023 reviewed independently and revealed no evidence of recurrent or progressive disease.  Continue dose reduced to 5 mg Revlimid  daily for 21 days with 7 days off.  Will not increase dose of Revlimid  at this time.  Patient now receives Zometa  every 3 months, proceed with treatment today.  Return to clinic in 4 weeks for laboratory work and evaluation by clinical pharmacy.  Patient to then return to clinic in 12 weeks for further evaluation and continuation of treatment.    Thrombocytopenia: Platelets mildly improved to 113.  Continue low-dose Revlimid  as above.  Anemia: Globin has trended up slightly to 11.8. Leukopenia: Total white blood cell count improved to 3.1.  Monitor.  Proceed with Revlimid  as above. Hypocalcemia: Resolved. Renal insufficiency: GFR improved to 51.  Monitor.   Vaccinations: Continue revaccination schedule as per [facility].  Patient has follow-up on August 22, 2023. Pulmonary nodule: Resolved. Left knee pain: Patient does not complain of this today.  Continue conservative management and injections as needed per orthopedics. Hand pain: Continue conservative management and injections as needed per orthopedics Peripheral neuropathy:  Patient now taking gabapentin .  Continue follow-up with neurology as scheduled.   Rash: Patient does not complain of this today.  Possibly related to Revlimid .  Continue dose reduction as above.   Patient expressed understanding and was in agreement with this plan. She also understands that She can call clinic at any time with any questions, concerns, or complaints.      Cancer Staging  Lambda light chain myeloma (HCC) Staging form: Plasma Cell Myeloma and Plasma Cell Disorders, AJCC 8th  Edition - Clinical stage from 09/16/2020: RISS Stage I (Beta-2 -microglobulin (mg/L): 2.5, Albumin (g/dL): 4.9, ISS: Stage I, High-risk cytogenetics: Absent, LDH: Normal) - Signed by Jacobo Evalene PARAS, MD on 09/16/2020 Beta 2 microglobulin range (mg/L): Less than 3.5 Albumin range (g/dL): Greater than or equal to 3.5 Cytogenetics: 1p deletion  Evalene PARAS Jacobo, MD   08/20/2023 3:25 PM

## 2023-08-20 NOTE — Progress Notes (Signed)
Pt in for follow up, denies any concerns today. 

## 2023-08-20 NOTE — Patient Instructions (Signed)

## 2023-08-21 ENCOUNTER — Encounter: Payer: Self-pay | Admitting: Pharmacist

## 2023-08-21 LAB — KAPPA/LAMBDA LIGHT CHAINS
Kappa free light chain: 13.3 mg/L (ref 3.3–19.4)
Kappa, lambda light chain ratio: 1.58 (ref 0.26–1.65)
Lambda free light chains: 8.4 mg/L (ref 5.7–26.3)

## 2023-08-28 ENCOUNTER — Other Ambulatory Visit: Payer: Self-pay | Admitting: Oncology

## 2023-08-30 ENCOUNTER — Other Ambulatory Visit: Payer: Self-pay | Admitting: Oncology

## 2023-08-30 DIAGNOSIS — C9000 Multiple myeloma not having achieved remission: Secondary | ICD-10-CM

## 2023-09-13 ENCOUNTER — Other Ambulatory Visit: Payer: Self-pay | Admitting: Oncology

## 2023-09-13 ENCOUNTER — Other Ambulatory Visit: Payer: Self-pay | Admitting: *Deleted

## 2023-09-13 ENCOUNTER — Encounter: Payer: Self-pay | Admitting: Oncology

## 2023-09-13 MED ORDER — BUSPIRONE HCL 30 MG PO TABS
30.0000 mg | ORAL_TABLET | Freq: Two times a day (BID) | ORAL | 2 refills | Status: AC
Start: 2023-09-13 — End: ?

## 2023-09-17 ENCOUNTER — Ambulatory Visit: Admitting: Pharmacist

## 2023-09-17 ENCOUNTER — Other Ambulatory Visit

## 2023-09-21 ENCOUNTER — Other Ambulatory Visit: Payer: Self-pay | Admitting: *Deleted

## 2023-09-21 DIAGNOSIS — C9000 Multiple myeloma not having achieved remission: Secondary | ICD-10-CM

## 2023-09-24 ENCOUNTER — Inpatient Hospital Stay: Attending: Oncology

## 2023-09-24 ENCOUNTER — Other Ambulatory Visit: Payer: Self-pay | Admitting: Oncology

## 2023-09-24 ENCOUNTER — Inpatient Hospital Stay: Admitting: Pharmacist

## 2023-09-24 VITALS — BP 115/68 | HR 71 | Temp 98.4°F | Resp 16 | Wt 117.9 lb

## 2023-09-24 DIAGNOSIS — C9000 Multiple myeloma not having achieved remission: Secondary | ICD-10-CM

## 2023-09-24 DIAGNOSIS — C9001 Multiple myeloma in remission: Secondary | ICD-10-CM | POA: Insufficient documentation

## 2023-09-24 DIAGNOSIS — Z95828 Presence of other vascular implants and grafts: Secondary | ICD-10-CM

## 2023-09-24 LAB — CMP (CANCER CENTER ONLY)
ALT: 16 U/L (ref 0–44)
AST: 20 U/L (ref 15–41)
Albumin: 3.8 g/dL (ref 3.5–5.0)
Alkaline Phosphatase: 66 U/L (ref 38–126)
Anion gap: 8 (ref 5–15)
BUN: 17 mg/dL (ref 8–23)
CO2: 25 mmol/L (ref 22–32)
Calcium: 8.6 mg/dL — ABNORMAL LOW (ref 8.9–10.3)
Chloride: 104 mmol/L (ref 98–111)
Creatinine: 1.37 mg/dL — ABNORMAL HIGH (ref 0.44–1.00)
GFR, Estimated: 42 mL/min — ABNORMAL LOW (ref >60)
Glucose, Bld: 123 mg/dL — ABNORMAL HIGH (ref 70–99)
Potassium: 3.3 mmol/L — ABNORMAL LOW (ref 3.5–5.1)
Sodium: 137 mmol/L (ref 135–145)
Total Bilirubin: 0.5 mg/dL (ref 0.0–1.2)
Total Protein: 6.3 g/dL — ABNORMAL LOW (ref 6.5–8.1)

## 2023-09-24 LAB — CBC WITH DIFFERENTIAL/PLATELET
Abs Immature Granulocytes: 0 K/uL (ref 0.00–0.07)
Basophils Absolute: 0 K/uL (ref 0.0–0.1)
Basophils Relative: 1 %
Eosinophils Absolute: 0.3 K/uL (ref 0.0–0.5)
Eosinophils Relative: 11 %
HCT: 35.5 % — ABNORMAL LOW (ref 36.0–46.0)
Hemoglobin: 11.6 g/dL — ABNORMAL LOW (ref 12.0–15.0)
Immature Granulocytes: 0 %
Lymphocytes Relative: 23 %
Lymphs Abs: 0.7 K/uL (ref 0.7–4.0)
MCH: 33.4 pg (ref 26.0–34.0)
MCHC: 32.7 g/dL (ref 30.0–36.0)
MCV: 102.3 fL — ABNORMAL HIGH (ref 80.0–100.0)
Monocytes Absolute: 0.2 K/uL (ref 0.1–1.0)
Monocytes Relative: 7 %
Neutro Abs: 1.7 K/uL (ref 1.7–7.7)
Neutrophils Relative %: 58 %
Platelets: 114 K/uL — ABNORMAL LOW (ref 150–400)
RBC: 3.47 MIL/uL — ABNORMAL LOW (ref 3.87–5.11)
RDW: 14.1 % (ref 11.5–15.5)
WBC: 3 K/uL — ABNORMAL LOW (ref 4.0–10.5)
nRBC: 0 % (ref 0.0–0.2)

## 2023-09-24 LAB — SAMPLE TO BLOOD BANK

## 2023-09-24 MED ORDER — HEPARIN SOD (PORK) LOCK FLUSH 100 UNIT/ML IV SOLN
500.0000 [IU] | Freq: Once | INTRAVENOUS | Status: AC
  Administered 2023-09-24: 500 [IU] via INTRAVENOUS
  Filled 2023-09-24: qty 5

## 2023-09-24 MED ORDER — SODIUM CHLORIDE 0.9% FLUSH
10.0000 mL | Freq: Once | INTRAVENOUS | Status: AC
  Administered 2023-09-24: 10 mL via INTRAVENOUS
  Filled 2023-09-24: qty 10

## 2023-09-24 NOTE — Progress Notes (Signed)
 Clinical Pharmacist Practitioner Clinic [facility]  [phone] [phone]  Patient Care Team: Corlis Honor BROCKS, MD as PCP - General (Internal Medicine) Jacobo Evalene PARAS, MD as Consulting Physician (Oncology)   Name of the patient: Paden Kuras  [id number]  [birth year]   Date of visit: 09/24/23  HPI: Patient is a 68 y.o. female with lambda light chain myeloma s/p transplant. Maintenance lenalidomide  was held due to pancytopenia, but with count improvement patient restarted lenalidomide  in Nov 2024.   Reason for Consult: Oral chemotherapy follow-up for lenalidomide  therapy.   PAST MEDICAL HISTORY: Past Medical History:  Diagnosis Date   Anxiety    Diabetes mellitus without complication (HCC) 2016   GERD (gastroesophageal reflux disease)    Hyperlipidemia    Myeloma (HCC)    Myeloma (HCC)    Personal history of colonic polyps    Squamous cell carcinoma of skin 11/25/2013   Left chest. WD SCC with superficial infiltration.   Tachycardia     HEMATOLOGY/ONCOLOGY HISTORY:  Oncology History  Lambda light chain myeloma (HCC)  08/24/2020 Initial Diagnosis   Lambda light chain myeloma (HCC)   09/16/2020 Cancer Staging   Staging form: Plasma Cell Myeloma and Plasma Cell Disorders, AJCC 8th Edition - Clinical stage from 09/16/2020: RISS Stage I (Beta-2 -microglobulin (mg/L): 2.5, Albumin (g/dL): 4.9, ISS: Stage I, High-risk cytogenetics: Absent, LDH: Normal) - Signed by Jacobo Evalene PARAS, MD on 09/16/2020 Beta 2 microglobulin range (mg/L): Less than 3.5 Albumin range (g/dL): Greater than or equal to 3.5 Cytogenetics: 1p deletion   11/02/2020 - 07/22/2021 Chemotherapy   Patient is on Treatment Plan : POST TRANSPLANT DaraVRd (Daratumumab  SQ) q21d x 2 Cycles (Consolidation)- cycle 5&6     08/02/2021 - 09/27/2021 Chemotherapy   Patient is on Treatment Plan : MYELOMA Daratumumab  SQ q28d- Maintainance treatment w/ revlimid      08/02/2021 - 08/15/2022  Chemotherapy   Patient is on Treatment Plan : MYELOMA POST-TRANSPLANT MAINTENANE Daratumumab  SQ + Lenalidomide  q28d (Maintenance)       ALLERGIES:  has no known allergies.  MEDICATIONS:  Current Outpatient Medications  Medication Sig Dispense Refill   acetaminophen  (TYLENOL ) 325 MG tablet Take 650 mg by mouth 2 (two) times a week. Premed for velcade . Taking Tuesdays and Friday     acyclovir  (ZOVIRAX ) 400 MG tablet TAKE 1 TABLET BY MOUTH TWICE DAILY 60 tablet 5   albuterol (VENTOLIN HFA) 108 (90 Base) MCG/ACT inhaler Inhale 2 puffs into the lungs every 4 (four) hours as needed.     ALPRAZolam  (XANAX ) 0.5 MG tablet Take 1 tablet (0.5 mg total) by mouth at bedtime as needed for anxiety. TAKE 1 TABLET BY MOUTH AT BEDTIME AS NEEDED FOR ANXIETY 30 tablet 0   ascorbic acid (VITAMIN C) 500 MG tablet Take 500 mg by mouth daily.     ASPIRIN  81 PO Take 81 mg by mouth daily.     atorvastatin  (LIPITOR) 20 MG tablet Take 20 mg by mouth daily.     busPIRone  (BUSPAR ) 30 MG tablet Take 1 tablet (30 mg total) by mouth 2 (two) times daily. 60 tablet 2   Calcium  Carb-Cholecalciferol  (OYSTER SHELL CALCIUM /VITAMIN D) 500-5 MG-MCG PACK Take by mouth.     CALCIUM -VITAMIN D PO Take by mouth 2 (two) times daily.     cyclobenzaprine  (FLEXERIL ) 10 MG tablet TAKE 1 TABLET BY MOUTH AT BEDTIME 30 tablet 2   dexamethasone  (DECADRON ) 4 MG tablet Take 4 mg by mouth daily. 5 tabs po q28 days  diphenhydrAMINE  (BENADRYL ) 25 mg capsule Take 25 mg by mouth daily.     diphenoxylate -atropine  (LOMOTIL ) 2.5-0.025 MG tablet Take 1-2 tablets by mouth every 8 (eight) hours as needed for diarrhea or loose stools. 90 tablet 0   DULoxetine (CYMBALTA) 20 MG capsule Take 20 mg by mouth.     ferrous sulfate 325 (65 FE) MG tablet Take 325 mg by mouth daily with breakfast.     gabapentin  (NEURONTIN ) 300 MG capsule TAKE 1 CAPSULE BY MOUTH TWICE DAILY 60 capsule 2   gabapentin  (NEURONTIN ) 400 MG capsule Take 400 mg by mouth. 400 mg in  morning 400 mg at noon 1200 mg at bedtime     ivabradine (CORLANOR) 5 MG TABS tablet Take 5 mg by mouth 2 (two) times daily with a meal.     lenalidomide  (REVLIMID ) 5 MG capsule TAKE 1 CAPSULE BY MOUTH 1 TIME A DAY FOR 21 DAYS ON THEN 7 DAYS OFF 21 capsule 0   lidocaine -prilocaine  (EMLA ) cream APPLY 1 APPLICATION TOPICALLY AS NEEDED 30 g 2   Magnesium  400 MG TABS Take 1 tablet by mouth daily.     meloxicam  (MOBIC ) 15 MG tablet Take 15 mg by mouth daily.     methocarbamol  (ROBAXIN ) 500 MG tablet Take 1 tablet (500 mg total) by mouth every 6 (six) hours as needed for muscle spasms. 40 tablet 1   MOUNJARO 7.5 MG/0.5ML Pen Inject 7.5 mg into the skin once a week.     Multiple Vitamin (MULTI-VITAMIN) tablet Take 1 tablet by mouth daily.     omeprazole (PRILOSEC) 40 MG capsule Take 40 mg by mouth daily.     ondansetron  (ZOFRAN ) 8 MG tablet Take 1 tablet (8 mg total) by mouth 2 (two) times daily as needed (Nausea or vomiting). 60 tablet 2   oxyCODONE -acetaminophen  (PERCOCET/ROXICET) 5-325 MG tablet TAKE 1-2 TABLETS BY MOUTH EVERY 4 HOURS AS NEEDED FOR SEVERE PAIN 90 tablet 0   predniSONE  (DELTASONE ) 10 MG tablet Take 1 tablet (10 mg total) by mouth daily. Take for 3 days along with start of every 28 day Revlimid  cycle. Take with food. (Patient not taking: Reported on 08/20/2023) 9 tablet 3   prochlorperazine  (COMPAZINE ) 10 MG tablet TAKE 1 TABLET BY MOUTH EVERY 6 HOURS AS NEEDED FOR NAUSEA OR VOMITING 60 tablet 2   traMADol  (ULTRAM ) 50 MG tablet TAKE 1 TABLET BY MOUTH EVERY 6 HOURS AS NEEDED FOR PAIN 60 tablet 1   traZODone  (DESYREL ) 50 MG tablet TAKE 1 TABLET BY MOUTH AT BEDTIME 30 tablet 1   No current facility-administered medications for this visit.   Facility-Administered Medications Ordered in Other Visits  Medication Dose Route Frequency Provider Last Rate Last Admin   0.9 %  sodium chloride  infusion   Intravenous Continuous Finnegan, Timothy J, MD   Stopped at 04/07/21 1142   heparin  lock  flush 100 unit/mL  500 Units Intravenous Once Finnegan, Timothy J, MD       sodium chloride  flush (NS) 0.9 % injection 10 mL  10 mL Intravenous PRN Finnegan, Timothy J, MD   10 mL at 11/23/20 0932   sodium chloride  flush (NS) 0.9 % injection 10 mL  10 mL Intravenous PRN Finnegan, Timothy J, MD   10 mL at 04/18/22 1417   sodium chloride  flush (NS) 0.9 % injection 10 mL  10 mL Intravenous Once Finnegan, Timothy J, MD        VITAL SIGNS: BP 115/68 (BP Location: Left Arm, Patient Position: Sitting)   Pulse  71   Temp 98.4 F (36.9 C) (Tympanic)   Resp 16   Wt 53.5 kg (117 lb 14.4 oz)   BMI 21.56 kg/m  Filed Weights   09/24/23 1416  Weight: 53.5 kg (117 lb 14.4 oz)    Estimated body mass index is 21.56 kg/m as calculated from the following:   Height as of 07/26/23: 5' 2 (1.575 m).   Weight as of this encounter: 53.5 kg (117 lb 14.4 oz).  LABS: CBC:    Component Value Date/Time   WBC 3.1 (L) 08/20/2023 1403   HGB 11.8 (L) 08/20/2023 1403   HGB 11.4 (L) 07/17/2023 0909   HCT 35.2 (L) 08/20/2023 1403   PLT 113 (L) 08/20/2023 1403   PLT 78 (L) 07/17/2023 0909   MCV 103.5 (H) 08/20/2023 1403   NEUTROABS 1.6 (L) 08/20/2023 1403   LYMPHSABS 1.0 08/20/2023 1403   MONOABS 0.5 08/20/2023 1403   EOSABS 0.0 08/20/2023 1403   BASOSABS 0.0 08/20/2023 1403   Comprehensive Metabolic Panel:    Component Value Date/Time   NA 139 08/20/2023 1403   K 3.8 08/20/2023 1403   CL 109 08/20/2023 1403   CO2 23 08/20/2023 1403   BUN 16 08/20/2023 1403   CREATININE 1.18 (H) 08/20/2023 1403   GLUCOSE 132 (H) 08/20/2023 1403   CALCIUM  9.0 08/20/2023 1403   AST 27 08/20/2023 1403   ALT 25 08/20/2023 1403   ALKPHOS 58 08/20/2023 1403   BILITOT 0.5 08/20/2023 1403   PROT 6.3 (L) 08/20/2023 1403   ALBUMIN 3.9 08/20/2023 1403     Present during today's visit: patient only  Assessment and Plan: CMP/CBC reviewed, patient to continue lenalidomide  5mg  21 on/7 off Patient is on day 9 of her  cycle Overall patient is tolerating therapy well New reports of pain on her left side (rib area) and right hip area. This has been going on for ~2 weeks. No reported falls/injuries or change or trouble with urination.  Pain managed with scheduled tramadol  and prn percocet (pt reports use every 5-6 days) Reviewed PET from 07/17/2023 shows Scattered mild hypermetabolism associated with old rib fractures, index SUV max on the left, 2.8 and Lytic lesion in the superior right iliac wing, SUV max 2.9, similar. Will discuss repeat imaging with Dr. Jacobo prior to next MD visit   Oral Chemotherapy Side Effect/Intolerance:  Rash: managed currently with diphenhydramine  and prednisone  No other side effects noted   Oral Chemotherapy Adherence: No missed doses reported No patient barriers to medication adherence identified.   New medications: no new meds  Medication Access Issues: No issues, patient fills medication at [facility]  Patient expressed understanding and was in agreement with this plan. She also understands that She can call clinic at any time with any questions, concerns, or complaints.   Follow-up plan: RTC in 4 weeks  Thank you for allowing me to participate in the care of this very pleasant patient.   Time Total: 15 mins  Visit consisted of counseling and education on dealing with issues of symptom management in the setting of serious and potentially life-threatening illness.Greater than 50%  of this time was spent counseling and coordinating care related to the above assessment and plan.  Signed by: Anneta Rounds N. Leilyn Frayre, PharmD, BCPS, NEILA, CPP Hematology/Oncology Clinical Pharmacist Practitioner [place] [phone]  09/24/2023 2:18 PM

## 2023-09-25 ENCOUNTER — Other Ambulatory Visit: Payer: Self-pay | Admitting: Oncology

## 2023-09-25 LAB — KAPPA/LAMBDA LIGHT CHAINS
Kappa free light chain: 21 mg/L — ABNORMAL HIGH (ref 3.3–19.4)
Kappa, lambda light chain ratio: 1.63 (ref 0.26–1.65)
Lambda free light chains: 12.9 mg/L (ref 5.7–26.3)

## 2023-09-26 ENCOUNTER — Encounter: Payer: Self-pay | Admitting: Oncology

## 2023-09-27 ENCOUNTER — Encounter: Payer: Self-pay | Admitting: Pharmacist

## 2023-10-03 NOTE — Progress Notes (Unsigned)
 Discharge Plan  Member has been discharged from [facility]. Please see below Discharge Plan for details. If you have any questions, please contact our Clinical Team at [phone].  Name Marci Polito Date of Birth 06-04-1955  Discharge Date: 2023-09-25 Discharged Reason Goals Met: Member-determined  Referring Provider: Evalene Reusing Current Psychiatric Medications 1. Trazodone  50mg  for sleep Please advise if you consent to prescribe post-discharge  Referrals: No referrals  Discharge Summary Care Delivered: Member attended multiple [facility] and Coach Visits over a 5 month period.  Current status: Reason for discharge: Member requested, goals met  Should referring organization contact?: Member is not interested in additional support, receiving local behavioral health support  "[facility] remains available should patient express interest in the future."  [facility] Provider: Kedric Severin, Health Coach

## 2023-10-12 ENCOUNTER — Inpatient Hospital Stay: Attending: Oncology

## 2023-10-12 ENCOUNTER — Ambulatory Visit
Admission: RE | Admit: 2023-10-12 | Discharge: 2023-10-12 | Disposition: A | Discharge status: Home / Self Care | Source: Ambulatory Visit | Attending: Oncology | Admitting: Oncology

## 2023-10-12 DIAGNOSIS — Z452 Encounter for adjustment and management of vascular access device: Secondary | ICD-10-CM | POA: Insufficient documentation

## 2023-10-12 DIAGNOSIS — C9002 Multiple myeloma in relapse: Secondary | ICD-10-CM | POA: Insufficient documentation

## 2023-10-12 DIAGNOSIS — I2510 Atherosclerotic heart disease of native coronary artery without angina pectoris: Secondary | ICD-10-CM | POA: Diagnosis not present

## 2023-10-12 DIAGNOSIS — I700 Atherosclerosis of aorta: Secondary | ICD-10-CM | POA: Insufficient documentation

## 2023-10-12 DIAGNOSIS — C9000 Multiple myeloma not having achieved remission: Secondary | ICD-10-CM | POA: Diagnosis present

## 2023-10-12 DIAGNOSIS — C9001 Multiple myeloma in remission: Secondary | ICD-10-CM | POA: Diagnosis present

## 2023-10-12 LAB — GLUCOSE, CAPILLARY: Glucose-Capillary: 102 mg/dL — ABNORMAL HIGH (ref 70–99)

## 2023-10-12 MED ORDER — HEPARIN SOD (PORK) LOCK FLUSH 100 UNIT/ML IV SOLN
INTRAVENOUS | Status: AC
  Filled 2023-10-12: qty 5

## 2023-10-12 MED ORDER — FLUDEOXYGLUCOSE F - 18 (FDG) INJECTION
5.5800 | Freq: Once | INTRAVENOUS | Status: AC | PRN
  Administered 2023-10-12: 5.58 via INTRAVENOUS

## 2023-10-12 MED ORDER — HEPARIN SOD (PORK) LOCK FLUSH 100 UNIT/ML IV SOLN
500.0000 [IU] | Freq: Once | INTRAVENOUS | Status: DC
  Filled 2023-10-12: qty 5

## 2023-10-12 NOTE — Patient Instructions (Signed)

## 2023-10-15 ENCOUNTER — Ambulatory Visit: Admitting: Dermatology

## 2023-10-22 ENCOUNTER — Other Ambulatory Visit: Payer: Self-pay | Admitting: Oncology

## 2023-10-25 ENCOUNTER — Other Ambulatory Visit: Payer: Self-pay | Admitting: Oncology

## 2023-10-25 DIAGNOSIS — C9000 Multiple myeloma not having achieved remission: Secondary | ICD-10-CM

## 2023-11-10 ENCOUNTER — Other Ambulatory Visit: Payer: Self-pay | Admitting: Oncology

## 2023-11-12 ENCOUNTER — Encounter: Payer: Self-pay | Admitting: Oncology

## 2023-11-12 ENCOUNTER — Inpatient Hospital Stay

## 2023-11-12 ENCOUNTER — Other Ambulatory Visit: Payer: Self-pay

## 2023-11-12 ENCOUNTER — Inpatient Hospital Stay: Admitting: Oncology

## 2023-11-12 ENCOUNTER — Inpatient Hospital Stay: Attending: Oncology

## 2023-11-12 VITALS — BP 126/65 | HR 76 | Temp 98.0°F | Resp 16 | Wt 114.0 lb

## 2023-11-12 VITALS — BP 119/54 | HR 71

## 2023-11-12 DIAGNOSIS — D693 Immune thrombocytopenic purpura: Secondary | ICD-10-CM | POA: Diagnosis not present

## 2023-11-12 DIAGNOSIS — D72819 Decreased white blood cell count, unspecified: Secondary | ICD-10-CM | POA: Insufficient documentation

## 2023-11-12 DIAGNOSIS — C9000 Multiple myeloma not having achieved remission: Secondary | ICD-10-CM

## 2023-11-12 DIAGNOSIS — Z79899 Other long term (current) drug therapy: Secondary | ICD-10-CM | POA: Diagnosis not present

## 2023-11-12 DIAGNOSIS — D649 Anemia, unspecified: Secondary | ICD-10-CM | POA: Diagnosis not present

## 2023-11-12 DIAGNOSIS — E1142 Type 2 diabetes mellitus with diabetic polyneuropathy: Secondary | ICD-10-CM | POA: Insufficient documentation

## 2023-11-12 DIAGNOSIS — Z9484 Stem cells transplant status: Secondary | ICD-10-CM | POA: Diagnosis not present

## 2023-11-12 DIAGNOSIS — C9001 Multiple myeloma in remission: Secondary | ICD-10-CM | POA: Insufficient documentation

## 2023-11-12 LAB — COMPREHENSIVE METABOLIC PANEL WITH GFR
ALT: 26 U/L (ref 0–44)
AST: 28 U/L (ref 15–41)
Albumin: 3.6 g/dL (ref 3.5–5.0)
Alkaline Phosphatase: 79 U/L (ref 38–126)
Anion gap: 9 (ref 5–15)
BUN: 17 mg/dL (ref 8–23)
CO2: 24 mmol/L (ref 22–32)
Calcium: 8.7 mg/dL — ABNORMAL LOW (ref 8.9–10.3)
Chloride: 103 mmol/L (ref 98–111)
Creatinine, Ser: 1.19 mg/dL — ABNORMAL HIGH (ref 0.44–1.00)
GFR, Estimated: 50 mL/min — ABNORMAL LOW (ref >60)
Glucose, Bld: 221 mg/dL — ABNORMAL HIGH (ref 70–99)
Potassium: 3.6 mmol/L (ref 3.5–5.1)
Sodium: 136 mmol/L (ref 135–145)
Total Bilirubin: 0.5 mg/dL (ref 0.0–1.2)
Total Protein: 6.3 g/dL — ABNORMAL LOW (ref 6.5–8.1)

## 2023-11-12 LAB — CBC WITH DIFFERENTIAL/PLATELET
Abs Immature Granulocytes: 0.02 K/uL (ref 0.00–0.07)
Basophils Absolute: 0 K/uL (ref 0.0–0.1)
Basophils Relative: 1 %
Eosinophils Absolute: 0 K/uL (ref 0.0–0.5)
Eosinophils Relative: 0 %
HCT: 33.9 % — ABNORMAL LOW (ref 36.0–46.0)
Hemoglobin: 11 g/dL — ABNORMAL LOW (ref 12.0–15.0)
Immature Granulocytes: 1 %
Lymphocytes Relative: 15 %
Lymphs Abs: 0.3 K/uL — ABNORMAL LOW (ref 0.7–4.0)
MCH: 33.2 pg (ref 26.0–34.0)
MCHC: 32.4 g/dL (ref 30.0–36.0)
MCV: 102.4 fL — ABNORMAL HIGH (ref 80.0–100.0)
Monocytes Absolute: 0.1 K/uL (ref 0.1–1.0)
Monocytes Relative: 7 %
Neutro Abs: 1.4 K/uL — ABNORMAL LOW (ref 1.7–7.7)
Neutrophils Relative %: 76 %
Platelets: 91 K/uL — ABNORMAL LOW (ref 150–400)
RBC: 3.31 MIL/uL — ABNORMAL LOW (ref 3.87–5.11)
RDW: 14.6 % (ref 11.5–15.5)
WBC: 1.9 K/uL — ABNORMAL LOW (ref 4.0–10.5)
nRBC: 0 % (ref 0.0–0.2)

## 2023-11-12 LAB — SAMPLE TO BLOOD BANK

## 2023-11-12 MED ORDER — SODIUM CHLORIDE 0.9 % IV SOLN
Freq: Once | INTRAVENOUS | Status: AC
  Filled 2023-11-12: qty 250

## 2023-11-12 MED ORDER — ZOLEDRONIC ACID 4 MG/5ML IV CONC
3.0000 mg | Freq: Once | INTRAVENOUS | Status: AC
  Administered 2023-11-12: 3 mg via INTRAVENOUS
  Filled 2023-11-12: qty 3.75

## 2023-11-12 NOTE — Progress Notes (Signed)
 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  ITP.  INTERVAL HISTORY: Patient returns to clinic today for repeat laboratory work, further evaluation, and continuation of Zometa  and Revlimid .  She continues to feel well and remains asymptomatic.  She is tolerating her treatments without significant side effects.  Her peripheral neuropathy is well-controlled with her current dose of gabapentin .  She has no other neurologic complaints.  She denies any recent fevers or illnesses.  She denies any chest pain, shortness of breath, cough, or hemoptysis.  She denies any nausea, vomiting, constipation, or diarrhea.  She has no urinary complaints.  Patient offers no further specific complaints today.  REVIEW OF SYSTEMS:   Review of Systems  Constitutional: Negative.  Negative for fever, malaise/fatigue and weight loss.  HENT: Negative.  Negative for congestion and sinus pain.   Respiratory: Negative.  Negative for cough, hemoptysis and shortness of breath.   Cardiovascular:  Negative for chest pain and leg swelling.  Gastrointestinal: Negative.  Negative for abdominal pain, diarrhea and nausea.  Genitourinary: Negative.  Negative for dysuria.  Musculoskeletal: Negative.  Negative for back pain, joint pain and neck pain.  Skin: Negative.  Negative for rash.  Neurological:  Positive for sensory change. Negative for dizziness, tremors, focal weakness, weakness and headaches.  Endo/Heme/Allergies: Negative.  Does not bruise/bleed easily.  Psychiatric/Behavioral: Negative.  The patient is not nervous/anxious and does not have insomnia.      As per HPI. Otherwise, a complete review of systems is negative.  PAST MEDICAL HISTORY: Past Medical History:  Diagnosis Date   Anxiety    Diabetes mellitus without complication (HCC) 2016   GERD (gastroesophageal reflux disease)    Hyperlipidemia    Myeloma (HCC)    Myeloma (HCC)    Personal history of colonic polyps    Squamous cell carcinoma of skin 11/25/2013   Left chest. WD SCC with superficial infiltration.   Tachycardia     PAST SURGICAL HISTORY: Past Surgical History:  Procedure Laterality Date   AUGMENTATION MAMMAPLASTY Bilateral 1983   silicone and replacement in 2001   BREAST ENHANCEMENT SURGERY  1983   CESAREAN SECTION  1987, 1989   COLONOSCOPY  1988, 1998,2003, 2008, 2013   COLONOSCOPY WITH PROPOFOL  N/A 08/16/2016   Procedure: COLONOSCOPY WITH PROPOFOL ;  Surgeon: Dessa Reyes ORN, MD;  Location: [facility];  Service: Endoscopy;  Laterality: N/A;   COLONOSCOPY WITH PROPOFOL  N/A 10/05/2021   Procedure: COLONOSCOPY WITH PROPOFOL ;  Surgeon: Dessa Reyes ORN, MD;  Location: [facility];  Service: Endoscopy;  Laterality: N/A;  HAS A PORT, NEEDS AMPICILLIN  PER OFFICE   DILATION AND CURETTAGE OF UTERUS     ENDOMETRIAL ABLATION  12/1991   ganglion cyst removal      IR BONE MARROW BIOPSY & ASPIRATION  08/08/2022   IR IMAGING GUIDED PORT INSERTION  10/27/2020   IR IMAGING GUIDED PORT INSERTION  04/04/2021   KYPHOPLASTY N/A 10/28/2020   Procedure: T12 and L3 KYPHOPLASTY;  Surgeon: Kathlynn Sharper, MD;  Location: [facility] OR[facility];  Service: Orthopedics;  Laterality: N/A;   TONSILLECTOMY     WRIST SURGERY Right 05/1995    FAMILY HISTORY: Family History  Problem Relation Age of Onset  Colon polyps Sister    Colon cancer Father 75   Diabetes Mother    Breast cancer Neg Hx     ADVANCED DIRECTIVES (Y/N):  N  HEALTH MAINTENANCE: Social History   Tobacco Use   Smoking status: Former    Current packs/day: 0.00    Average packs/day: 1 pack/day for 4.0 years (4.0 ttl  pk-yrs)    Types: Cigarettes    Start date: 42    Quit date: 1982    Years since quitting: 43.7   Smokeless tobacco: Never  Vaping Use   Vaping status: Never Used  Substance Use Topics   Alcohol use: Yes    Alcohol/week: 1.0 - 2.0 standard drink of alcohol    Types: 1 - 2 Glasses of wine per week    Comment: occassionally   Drug use: No     Colonoscopy:  PAP:  Bone density:  Lipid panel:  No Known Allergies  Current Outpatient Medications  Medication Sig Dispense Refill   acetaminophen  (TYLENOL ) 325 MG tablet Take 650 mg by mouth 2 (two) times a week. Premed for velcade . Taking Tuesdays and Friday     acyclovir  (ZOVIRAX ) 400 MG tablet TAKE 1 TABLET BY MOUTH TWICE DAILY 60 tablet 5   albuterol (VENTOLIN HFA) 108 (90 Base) MCG/ACT inhaler Inhale 2 puffs into the lungs every 4 (four) hours as needed.     ascorbic acid (VITAMIN C) 500 MG tablet Take 500 mg by mouth daily.     ASPIRIN  81 PO Take 81 mg by mouth daily.     atorvastatin  (LIPITOR) 20 MG tablet Take 20 mg by mouth daily.     busPIRone  (BUSPAR ) 30 MG tablet Take 1 tablet (30 mg total) by mouth 2 (two) times daily. 60 tablet 2   Calcium  Carb-Cholecalciferol  (OYSTER SHELL CALCIUM /VITAMIN D) 500-5 MG-MCG PACK Take by mouth.     CALCIUM -VITAMIN D PO Take by mouth 2 (two) times daily.     cyclobenzaprine  (FLEXERIL ) 10 MG tablet TAKE 1 TABLET BY MOUTH AT BEDTIME 30 tablet 2   dexamethasone  (DECADRON ) 4 MG tablet Take 4 mg by mouth daily. 5 tabs po q28 days     diphenhydrAMINE  (BENADRYL ) 25 mg capsule Take 25 mg by mouth daily.     diphenoxylate -atropine  (LOMOTIL ) 2.5-0.025 MG tablet Take 1-2 tablets by mouth every 8 (eight) hours as needed for diarrhea or loose stools. 90 tablet 0   DULoxetine (CYMBALTA) 20 MG capsule Take 20 mg by mouth.     ferrous sulfate 325 (65 FE) MG tablet Take 325 mg by mouth daily with breakfast.     gabapentin  (NEURONTIN ) 400 MG capsule Take 400 mg by mouth. 400 mg in morning 400 mg at noon 1200  mg at bedtime     ivabradine (CORLANOR) 5 MG TABS tablet Take 5 mg by mouth 2 (two) times daily with a meal.     lenalidomide  (REVLIMID ) 5 MG capsule TAKE 1 CAPSULE BY MOUTH 1 TIME A DAY FOR 21 DAYS ON THEN 7 DAYS OFF 21 capsule 0   lidocaine -prilocaine  (EMLA ) cream APPLY 1 APPLICATION TOPICALLY AS NEEDED 30 g 2   Magnesium  400 MG TABS Take 1 tablet by mouth daily.     meloxicam  (MOBIC ) 15 MG tablet Take 15 mg by mouth daily.     MOUNJARO 7.5 MG/0.5ML Pen Inject 7.5 mg into the skin once a week.     Multiple Vitamin (MULTI-VITAMIN) tablet Take 1 tablet by mouth daily.     omeprazole (PRILOSEC) 40  MG capsule Take 40 mg by mouth daily.     ondansetron  (ZOFRAN ) 8 MG tablet Take 1 tablet (8 mg total) by mouth 2 (two) times daily as needed (Nausea or vomiting). 60 tablet 2   oxyCODONE -acetaminophen  (PERCOCET/ROXICET) 5-325 MG tablet TAKE 1-2 TABLETS BY MOUTH EVERY 4 HOURS AS NEEDED FOR SEVERE PAIN 90 tablet 0   prochlorperazine  (COMPAZINE ) 10 MG tablet TAKE 1 TABLET BY MOUTH EVERY 6 HOURS AS NEEDED FOR NAUSEA OR VOMITING 60 tablet 2   traMADol  (ULTRAM ) 50 MG tablet TAKE 1 TABLET BY MOUTH EVERY 6 HOURS AS NEEDED FOR PAIN 60 tablet 1   traZODone  (DESYREL ) 50 MG tablet TAKE 1 TABLET BY MOUTH AT BEDTIME 30 tablet 1   ALPRAZolam  (XANAX ) 0.5 MG tablet Take 1 tablet (0.5 mg total) by mouth at bedtime as needed for anxiety. TAKE 1 TABLET BY MOUTH AT BEDTIME AS NEEDED FOR ANXIETY 30 tablet 0   gabapentin  (NEURONTIN ) 300 MG capsule TAKE 1 CAPSULE BY MOUTH TWICE DAILY 60 capsule 2   methocarbamol  (ROBAXIN ) 500 MG tablet Take 1 tablet (500 mg total) by mouth every 6 (six) hours as needed for muscle spasms. 40 tablet 1   predniSONE  (DELTASONE ) 10 MG tablet Take 1 tablet (10 mg total) by mouth daily. Take for 3 days along with start of every 28 day Revlimid  cycle. Take with food. (Patient not taking: Reported on 11/12/2023) 9 tablet 3   No current facility-administered medications for this visit.    Facility-Administered Medications Ordered in Other Visits  Medication Dose Route Frequency Provider Last Rate Last Admin   0.9 %  sodium chloride  infusion   Intravenous Continuous Brady Plant J, MD   Stopped at 04/07/21 1142   heparin  lock flush 100 unit/mL  500 Units Intravenous Once Charlyne Robertshaw J, MD       sodium chloride  flush (NS) 0.9 % injection 10 mL  10 mL Intravenous PRN Bernestine Holsapple J, MD   10 mL at 11/23/20 0932   sodium chloride  flush (NS) 0.9 % injection 10 mL  10 mL Intravenous PRN Johann Gascoigne J, MD   10 mL at 04/18/22 1417   sodium chloride  flush (NS) 0.9 % injection 10 mL  10 mL Intravenous Once Waverley Krempasky J, MD       zoledronic  acid (ZOMETA ) 3 mg in sodium chloride  0.9 % 100 mL IVPB  3 mg Intravenous Once Jamont Mellin J, MD        OBJECTIVE: Vitals:   11/12/23 1423  BP: 126/65  Pulse: 76  Resp: 16  Temp: 98 F (36.7 C)  SpO2: 97%      Body mass index is 20.85 kg/m.    ECOG FS:0 - Asymptomatic  General: Well-developed, well-nourished, no acute distress. Eyes: Pink conjunctiva, anicteric sclera. HEENT: Normocephalic, moist mucous membranes. Lungs: No audible wheezing or coughing. Heart: Regular rate and rhythm. Abdomen: Soft, nontender, no obvious distention. Musculoskeletal: No edema, cyanosis, or clubbing. Neuro: Alert, answering all questions appropriately. Cranial nerves grossly intact. Skin: No rashes or petechiae noted. Psych: Normal affect.  LAB RESULTS:  Lab Results  Component Value Date   NA 136 11/12/2023   K 3.6 11/12/2023   CL 103 11/12/2023   CO2 24 11/12/2023   GLUCOSE 221 (H) 11/12/2023   BUN 17 11/12/2023   CREATININE 1.19 (H) 11/12/2023   CALCIUM  8.7 (L) 11/12/2023   PROT 6.3 (L) 11/12/2023   ALBUMIN 3.6 11/12/2023   AST 28 11/12/2023   ALT 26 11/12/2023   ALKPHOS  79 11/12/2023   BILITOT 0.5 11/12/2023   GFRNONAA 50 (L) 11/12/2023    Lab Results  Component Value Date   WBC 1.9 (L) 11/12/2023    NEUTROABS 1.4 (L) 11/12/2023   HGB 11.0 (L) 11/12/2023   HCT 33.9 (L) 11/12/2023   MCV 102.4 (H) 11/12/2023   PLT 91 (L) 11/12/2023     STUDIES: No results found.   ONCOLOGY HISTORY:  Diagnosis confirmed from bone biopsy on August 17, 2020.  Initially her lambda free light chains were elevated at 432.1, but now are within normal limits at 12.1.  Immunoglobulins and SPEP are normal.  Bone marrow biopsy completed on August 26, 2020 revealed 25% plasma cells along with the deletion of 1p which is considered poor prognosis.  PET scan results from September 06, 2020 reviewed independently with 3 hypermetabolic lesions in right C5-6 facets, right iliac crest lesion, and L3 vertebral lesion.  After lengthy discussion with the patient, she agreed to pursue chemotherapy with daratumumab , Velcade , Revlimid , and dexamethasone  followed by autologous bone marrow transplant.  Patient received weekly daratumumab  for 4 cycles along with Velcade  on days 1, 4, 8, and 11.  She also received Revlimid  on days 1 through 14 of a 21-day cycle.  She completed cycle 4 of treatment on January 14, 2021.  Subsequent bone marrow biopsy on January 24, 2021 revealed complete remission with no evidence of myeloma.  Patient underwent autologous stem cell transplant at [facility] on February 24, 2021.  She was readmitted to the hospital on March 04, 2021 with neutropenic fever, diarrhea, and sepsis-like symptoms.  Infectious disease work-up was negative.   Patient previously received consolidation treatment with the GRIFFIN regimen.  She received consolidation with cycle 5 and 6 using daratumumab  on day 1, Velcade  on day 1, 4, 8, and 11, Revlimid  10 mg on days 1 through 14 with 7 days off, and weekly dexamethasone  on a 21-day cycle.  She was on maintenance daratumumab  on day 1 and Revlimid  10 mg on days 1 through 21 on a 28-day cycle for cycles 7 through 32.  Her most recent PET scan on July 31, 2022 reviewed independently and report  as above with no obvious evidence of recurrent disease.  A second bone marrow biopsy was completed on August 08, 2022 which also revealed no evidence of disease with normal cytogenetics.    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  PLAN:      Lambda light chain myeloma with 1p del:  Given her persistent pancytopenia, maintenance Revlimid  was discontinued in approximately July 2024 and then reinitiated in November 2024.  Her most recent bone marrow biopsy at [facility] on November 07, 2022 reported complete remission.  PET scan results from Jul 17, 2023 reviewed independently and revealed no evidence of recurrent or progressive disease.  Continue dose reduced to 5 mg Revlimid  daily for 21 days with 7 days off.  Will not increase dose of Revlimid  at this time.  Proceed with Zometa  today.  Return to clinic in 3 months with repeat laboratory, further evaluation, and continuation of treatment.   Thrombocytopenia: Chronic and unchanged.  Patient's platelet count is 91 today.  Continue low-dose Revlimid  as above.  Anemia: Hemoglobin trended down slightly to 11.0.  Monitor.   Leukopenia: Total white blood cell count decreased to 1.9.  Monitor.  Proceed with Revlimid  as above. Hypocalcemia: Mild.  Proceed with Zometa  as above. Renal insufficiency: Chronic and unchanged.  Patient's GFR is 50  today.  Monitor.   Vaccinations: Continue revaccination schedule as per [facility]  Patient was last seen on August 22, 2023.   Pulmonary nodule: Resolved. Left knee pain: Patient does not complain of this today.  Continue conservative management and injections as needed per orthopedics. Hand pain: Continue conservative management and injections as needed per orthopedics Peripheral neuropathy: Chronic and unchanged.  Continue current dose of gabapentin .  Continue follow-up with neurology as scheduled.    Patient expressed understanding and was in agreement with this plan. She  also understands that She can call clinic at any time with any questions, concerns, or complaints.      Cancer Staging  Lambda light chain myeloma (HCC) Staging form: Plasma Cell Myeloma and Plasma Cell Disorders, AJCC 8th Edition - Clinical stage from 09/16/2020: RISS Stage I (Beta-2 -microglobulin (mg/L): 2.5, Albumin (g/dL): 4.9, ISS: Stage I, High-risk cytogenetics: Absent, LDH: Normal) - Signed by Jacobo Evalene PARAS, MD on 09/16/2020 Beta 2 microglobulin range (mg/L): Less than 3.5 Albumin range (g/dL): Greater than or equal to 3.5 Cytogenetics: 1p deletion  Evalene PARAS Jacobo, MD   11/12/2023 3:55 PM

## 2023-11-12 NOTE — Progress Notes (Signed)
 Survivorship Care Plan visit completed.  Treatment summary reviewed and given to patient.  ASCO answers booklet reviewed and given to patient.  CARE program and Cancer Transitions discussed with patient along with other resources cancer center offers to patients and caregivers.  Patient verbalized understanding.

## 2023-11-13 LAB — KAPPA/LAMBDA LIGHT CHAINS
Kappa free light chain: 18 mg/L (ref 3.3–19.4)
Kappa, lambda light chain ratio: 1.1 (ref 0.26–1.65)
Lambda free light chains: 16.4 mg/L (ref 5.7–26.3)

## 2023-11-20 ENCOUNTER — Other Ambulatory Visit: Payer: Self-pay | Admitting: Oncology

## 2023-11-21 ENCOUNTER — Other Ambulatory Visit: Payer: Self-pay | Admitting: Internal Medicine

## 2023-11-21 ENCOUNTER — Encounter: Payer: Self-pay | Admitting: Oncology

## 2023-11-21 DIAGNOSIS — Z1231 Encounter for screening mammogram for malignant neoplasm of breast: Secondary | ICD-10-CM

## 2023-11-26 ENCOUNTER — Other Ambulatory Visit: Payer: Self-pay | Admitting: Oncology

## 2023-11-26 DIAGNOSIS — C9000 Multiple myeloma not having achieved remission: Secondary | ICD-10-CM

## 2023-11-28 ENCOUNTER — Ambulatory Visit: Admitting: Dermatology

## 2023-11-30 ENCOUNTER — Other Ambulatory Visit: Payer: Self-pay | Admitting: Internal Medicine

## 2023-11-30 DIAGNOSIS — Z1231 Encounter for screening mammogram for malignant neoplasm of breast: Secondary | ICD-10-CM

## 2023-12-03 ENCOUNTER — Ambulatory Visit: Admitting: Dermatology

## 2023-12-03 DIAGNOSIS — L814 Other melanin hyperpigmentation: Secondary | ICD-10-CM

## 2023-12-03 DIAGNOSIS — Z1283 Encounter for screening for malignant neoplasm of skin: Secondary | ICD-10-CM

## 2023-12-03 DIAGNOSIS — W908XXA Exposure to other nonionizing radiation, initial encounter: Secondary | ICD-10-CM | POA: Diagnosis not present

## 2023-12-03 DIAGNOSIS — D2372 Other benign neoplasm of skin of left lower limb, including hip: Secondary | ICD-10-CM

## 2023-12-03 DIAGNOSIS — L570 Actinic keratosis: Secondary | ICD-10-CM | POA: Diagnosis not present

## 2023-12-03 DIAGNOSIS — Z85828 Personal history of other malignant neoplasm of skin: Secondary | ICD-10-CM

## 2023-12-03 DIAGNOSIS — D1801 Hemangioma of skin and subcutaneous tissue: Secondary | ICD-10-CM

## 2023-12-03 DIAGNOSIS — L578 Other skin changes due to chronic exposure to nonionizing radiation: Secondary | ICD-10-CM

## 2023-12-03 DIAGNOSIS — L821 Other seborrheic keratosis: Secondary | ICD-10-CM

## 2023-12-03 DIAGNOSIS — L820 Inflamed seborrheic keratosis: Secondary | ICD-10-CM | POA: Diagnosis not present

## 2023-12-03 DIAGNOSIS — Z808 Family history of malignant neoplasm of other organs or systems: Secondary | ICD-10-CM

## 2023-12-03 DIAGNOSIS — D239 Other benign neoplasm of skin, unspecified: Secondary | ICD-10-CM

## 2023-12-03 DIAGNOSIS — D229 Melanocytic nevi, unspecified: Secondary | ICD-10-CM

## 2023-12-03 NOTE — Progress Notes (Signed)
 Follow-Up Visit   Subjective  Hannah Mcdonald is a 68 y.o. female who presents for the following: Skin Cancer Screening and Full Body Skin Exam, hx of SCC, family hx of Melanoma, check dark spot L chest, new, also persistent scaly bumps on shoulder and thighs.  The patient presents for Total-Body Skin Exam (TBSE) for skin cancer screening and mole check. The patient has spots, moles and lesions to be evaluated, some may be new or changing and the patient may have concern these could be cancer.    The following portions of the chart were reviewed this encounter and updated as appropriate: medications, allergies, medical history  Review of Systems:  No other skin or systemic complaints except as noted in HPI or Assessment and Plan.  Objective  Well appearing patient in no apparent distress; mood and affect are within normal limits.  A full examination was performed including scalp, head, eyes, ears, nose, lips, neck, chest, axillae, abdomen, back, buttocks, bilateral upper extremities, bilateral lower extremities, hands, feet, fingers, toes, fingernails, and toenails. All findings within normal limits unless otherwise noted below.   Relevant physical exam findings are noted in the Assessment and Plan.  L uppr sternum x 1, R shoulder x 2, L ant thigh x 1, Spinal mid back x 1, R ant thigh x 1 (6) Stuck on waxy tan papules with erythema at chest, back, left thigh, Benign features under dermoscopy.  scaly pink/flesh macule with keratotic rim  R ant thigh 4.15mm Pink/white stuck on scaly papules x 2 R shoulder R chest x 2,  R hand dorsum x 1 (3) Pink scaly macules  Assessment & Plan   SKIN CANCER SCREENING PERFORMED TODAY.  ACTINIC DAMAGE back - Chronic condition, secondary to cumulative UV/sun exposure - diffuse scaly erythematous macules with underlying dyspigmentation - Recommend daily broad spectrum sunscreen SPF 30+ to sun-exposed areas, reapply every 2 hours as needed.  - Staying  in the shade or wearing long sleeves, sun glasses (UVA+UVB protection) and wide brim hats (4-inch brim around the entire circumference of the hat) are also recommended for sun protection.  - Call for new or changing lesions.  LENTIGINES, SEBORRHEIC KERATOSES, HEMANGIOMAS - Benign normal skin lesions - Benign-appearing - Call for any changes  MELANOCYTIC NEVI - Tan-brown and/or pink-flesh-colored symmetric macules and papules - Benign appearing on exam today - Observation - Call clinic for new or changing moles - Recommend daily use of broad spectrum spf 30+ sunscreen to sun-exposed areas.   HISTORY OF SQUAMOUS CELL CARCINOMA OF THE SKIN - No evidence of recurrence today- L chest - Recommend regular full body skin exams - Recommend daily broad spectrum sunscreen SPF 30+ to sun-exposed areas, reapply every 2 hours as needed.  - Call if any new or changing lesions are noted between office visits  FAMILY HISTORY OF SKIN CANCER What type(s):melanoma Who affected: sister   HISTORY of  MULTIPLE MYELOMA Cont f/u with Dr. Jacobo    DERMATOFIBROMA L upper calf Exam: Firm pink/brown papulenodule with dimple sign L upper calf  Treatment Plan: A dermatofibroma is a benign growth possibly related to trauma, such as an insect bite, cut from shaving, or inflamed acne-type bump.  Treatment options to remove include shave or excision with resulting scar and risk of recurrence.  Since benign-appearing and not bothersome, will observe for now.   INFLAMED SEBORRHEIC KERATOSIS (6) L uppr sternum x 1, R shoulder x 2, L ant thigh x 1, Spinal mid back x 1, R ant  thigh x 1 (6) Symptomatic, irritating, patient would like treated.  R ant thigh ISK vs Porokeratosis Recheck spinal mid back on f/up Destruction of lesion - L uppr sternum x 1, R shoulder x 2, L ant thigh x 1, Spinal mid back x 1, R ant thigh x 1 (6)  Destruction method: cryotherapy   Informed consent: discussed and consent obtained    Lesion destroyed using liquid nitrogen: Yes   Region frozen until ice ball extended beyond lesion: Yes   Outcome: patient tolerated procedure well with no complications   Post-procedure details: wound care instructions given   Additional details:  Prior to procedure, discussed risks of blister formation, small wound, skin dyspigmentation, or rare scar following cryotherapy. Recommend Vaseline ointment to treated areas while healing.   AK (ACTINIC KERATOSIS) (3) R chest x 2,  R hand dorsum x 1 (3) Actinic keratoses are precancerous spots that appear secondary to cumulative UV radiation exposure/sun exposure over time. They are chronic with expected duration over 1 year. A portion of actinic keratoses will progress to squamous cell carcinoma of the skin. It is not possible to reliably predict which spots will progress to skin cancer and so treatment is recommended to prevent development of skin cancer.  Recommend daily broad spectrum sunscreen SPF 30+ to sun-exposed areas, reapply every 2 hours as needed.  Recommend staying in the shade or wearing long sleeves, sun glasses (UVA+UVB protection) and wide brim hats (4-inch brim around the entire circumference of the hat). Call for new or changing lesions. Destruction of lesion - R chest x 2,  R hand dorsum x 1 (3)  Destruction method: cryotherapy   Informed consent: discussed and consent obtained   Lesion destroyed using liquid nitrogen: Yes   Region frozen until ice ball extended beyond lesion: Yes   Outcome: patient tolerated procedure well with no complications   Post-procedure details: wound care instructions given   Additional details:  Prior to procedure, discussed risks of blister formation, small wound, skin dyspigmentation, or rare scar following cryotherapy. Recommend Vaseline ointment to treated areas while healing.    Return for TBSE, Hx of SCC, Hx of AKs.  I, Grayce Saunas, RMA, am acting as scribe for Rexene Rattler, MD  .   Documentation: I have reviewed the above documentation for accuracy and completeness, and I agree with the above.  Rexene Rattler, MD

## 2023-12-03 NOTE — Patient Instructions (Addendum)

## 2023-12-04 ENCOUNTER — Encounter: Payer: Self-pay | Admitting: Oncology

## 2023-12-05 ENCOUNTER — Other Ambulatory Visit: Payer: Self-pay | Admitting: *Deleted

## 2023-12-05 DIAGNOSIS — C9000 Multiple myeloma not having achieved remission: Secondary | ICD-10-CM

## 2023-12-08 ENCOUNTER — Other Ambulatory Visit: Payer: Self-pay | Admitting: Oncology

## 2023-12-10 ENCOUNTER — Encounter: Payer: Self-pay | Admitting: Oncology

## 2023-12-12 ENCOUNTER — Inpatient Hospital Stay: Attending: Oncology

## 2023-12-12 ENCOUNTER — Ambulatory Visit
Admission: RE | Admit: 2023-12-12 | Discharge: 2023-12-12 | Disposition: A | Source: Ambulatory Visit | Attending: Internal Medicine | Admitting: Internal Medicine

## 2023-12-12 DIAGNOSIS — C9001 Multiple myeloma in remission: Secondary | ICD-10-CM | POA: Insufficient documentation

## 2023-12-12 DIAGNOSIS — Z1231 Encounter for screening mammogram for malignant neoplasm of breast: Secondary | ICD-10-CM

## 2023-12-12 DIAGNOSIS — C9000 Multiple myeloma not having achieved remission: Secondary | ICD-10-CM

## 2023-12-12 LAB — CMP (CANCER CENTER ONLY)
ALT: 31 U/L (ref 0–44)
AST: 37 U/L (ref 15–41)
Albumin: 3.8 g/dL (ref 3.5–5.0)
Alkaline Phosphatase: 76 U/L (ref 38–126)
Anion gap: 7 (ref 5–15)
BUN: 20 mg/dL (ref 8–23)
CO2: 26 mmol/L (ref 22–32)
Calcium: 9.6 mg/dL (ref 8.9–10.3)
Chloride: 103 mmol/L (ref 98–111)
Creatinine: 1.36 mg/dL — ABNORMAL HIGH (ref 0.44–1.00)
GFR, Estimated: 43 mL/min — ABNORMAL LOW (ref >60)
Glucose, Bld: 115 mg/dL — ABNORMAL HIGH (ref 70–99)
Potassium: 3.3 mmol/L — ABNORMAL LOW (ref 3.5–5.1)
Sodium: 136 mmol/L (ref 135–145)
Total Bilirubin: 0.6 mg/dL (ref 0.0–1.2)
Total Protein: 6.7 g/dL (ref 6.5–8.1)

## 2023-12-12 LAB — CBC WITH DIFFERENTIAL/PLATELET
Abs Immature Granulocytes: 0.01 K/uL (ref 0.00–0.07)
Basophils Absolute: 0 K/uL (ref 0.0–0.1)
Basophils Relative: 1 %
Eosinophils Absolute: 0.1 K/uL (ref 0.0–0.5)
Eosinophils Relative: 3 %
HCT: 34.7 % — ABNORMAL LOW (ref 36.0–46.0)
Hemoglobin: 11.3 g/dL — ABNORMAL LOW (ref 12.0–15.0)
Immature Granulocytes: 0 %
Lymphocytes Relative: 31 %
Lymphs Abs: 0.9 K/uL (ref 0.7–4.0)
MCH: 32.8 pg (ref 26.0–34.0)
MCHC: 32.6 g/dL (ref 30.0–36.0)
MCV: 100.6 fL — ABNORMAL HIGH (ref 80.0–100.0)
Monocytes Absolute: 0.5 K/uL (ref 0.1–1.0)
Monocytes Relative: 16 %
Neutro Abs: 1.3 K/uL — ABNORMAL LOW (ref 1.7–7.7)
Neutrophils Relative %: 49 %
Platelets: 140 K/uL — ABNORMAL LOW (ref 150–400)
RBC: 3.45 MIL/uL — ABNORMAL LOW (ref 3.87–5.11)
RDW: 15.5 % (ref 11.5–15.5)
WBC: 2.8 K/uL — ABNORMAL LOW (ref 4.0–10.5)
nRBC: 0 % (ref 0.0–0.2)

## 2023-12-13 LAB — KAPPA/LAMBDA LIGHT CHAINS
Kappa free light chain: 18.9 mg/L (ref 3.3–19.4)
Kappa, lambda light chain ratio: 1.02 (ref 0.26–1.65)
Lambda free light chains: 18.6 mg/L (ref 5.7–26.3)

## 2023-12-24 ENCOUNTER — Encounter

## 2023-12-25 ENCOUNTER — Other Ambulatory Visit: Payer: Self-pay | Admitting: Oncology

## 2023-12-25 DIAGNOSIS — C9000 Multiple myeloma not having achieved remission: Secondary | ICD-10-CM

## 2023-12-31 ENCOUNTER — Encounter: Payer: Self-pay | Admitting: Oncology

## 2024-01-01 ENCOUNTER — Encounter: Payer: Self-pay | Admitting: *Deleted

## 2024-01-14 ENCOUNTER — Inpatient Hospital Stay: Attending: Oncology

## 2024-01-14 DIAGNOSIS — C9000 Multiple myeloma not having achieved remission: Secondary | ICD-10-CM

## 2024-01-14 LAB — CBC WITH DIFFERENTIAL/PLATELET
Abs Immature Granulocytes: 0.01 K/uL (ref 0.00–0.07)
Basophils Absolute: 0 K/uL (ref 0.0–0.1)
Basophils Relative: 0 %
Eosinophils Absolute: 0.1 K/uL (ref 0.0–0.5)
Eosinophils Relative: 2 %
HCT: 35.8 % — ABNORMAL LOW (ref 36.0–46.0)
Hemoglobin: 11.5 g/dL — ABNORMAL LOW (ref 12.0–15.0)
Immature Granulocytes: 0 %
Lymphocytes Relative: 19 %
Lymphs Abs: 0.8 K/uL (ref 0.7–4.0)
MCH: 32.6 pg (ref 26.0–34.0)
MCHC: 32.1 g/dL (ref 30.0–36.0)
MCV: 101.4 fL — ABNORMAL HIGH (ref 80.0–100.0)
Monocytes Absolute: 0.2 K/uL (ref 0.1–1.0)
Monocytes Relative: 6 %
Neutro Abs: 3 K/uL (ref 1.7–7.7)
Neutrophils Relative %: 73 %
Platelets: 117 K/uL — ABNORMAL LOW (ref 150–400)
RBC: 3.53 MIL/uL — ABNORMAL LOW (ref 3.87–5.11)
RDW: 15.8 % — ABNORMAL HIGH (ref 11.5–15.5)
WBC: 4.1 K/uL (ref 4.0–10.5)
nRBC: 0 % (ref 0.0–0.2)

## 2024-01-14 LAB — CMP (CANCER CENTER ONLY)
ALT: 46 U/L — ABNORMAL HIGH (ref 0–44)
AST: 28 U/L (ref 15–41)
Albumin: 3.6 g/dL (ref 3.5–5.0)
Alkaline Phosphatase: 59 U/L (ref 38–126)
Anion gap: 11 (ref 5–15)
BUN: 26 mg/dL — ABNORMAL HIGH (ref 8–23)
CO2: 26 mmol/L (ref 22–32)
Calcium: 9.4 mg/dL (ref 8.9–10.3)
Chloride: 100 mmol/L (ref 98–111)
Creatinine: 1.34 mg/dL — ABNORMAL HIGH (ref 0.44–1.00)
GFR, Estimated: 43 mL/min — ABNORMAL LOW (ref >60)
Glucose, Bld: 163 mg/dL — ABNORMAL HIGH (ref 70–99)
Potassium: 3.1 mmol/L — ABNORMAL LOW (ref 3.5–5.1)
Sodium: 137 mmol/L (ref 135–145)
Total Bilirubin: 0.7 mg/dL (ref 0.0–1.2)
Total Protein: 6.2 g/dL — ABNORMAL LOW (ref 6.5–8.1)

## 2024-01-15 LAB — KAPPA/LAMBDA LIGHT CHAINS
Kappa free light chain: 14.9 mg/L (ref 3.3–19.4)
Kappa, lambda light chain ratio: 0.99 (ref 0.26–1.65)
Lambda free light chains: 15.1 mg/L (ref 5.7–26.3)

## 2024-01-16 ENCOUNTER — Ambulatory Visit (INDEPENDENT_AMBULATORY_CARE_PROVIDER_SITE_OTHER): Payer: Self-pay | Admitting: Dermatology

## 2024-01-16 DIAGNOSIS — L988 Other specified disorders of the skin and subcutaneous tissue: Secondary | ICD-10-CM

## 2024-01-16 NOTE — Patient Instructions (Signed)

## 2024-01-16 NOTE — Progress Notes (Signed)
   Follow-Up Visit   Subjective  Hannah Mcdonald is a 68 y.o. female who presents for the following: Botox for facial elastosis  The following portions of the chart were reviewed this encounter and updated as appropriate: medications, allergies, medical history  Review of Systems:  No other skin or systemic complaints except as noted in HPI or Assessment and Plan.  Objective  Well appearing patient in no apparent distress; mood and affect are within normal limits.  A focused examination was performed of the face.  Relevant physical exam findings are noted in the Assessment and Plan.  Before botox photos if first time        Injection map photo     Assessment & Plan    Facial Elastosis Botox 49.5 units - frown complex 27.5 units - forehead 7.5 units - Botox comma 1.25 units x 2 - DAO 4 units x 2 - lip flip 4 units  Location: See attached image  Informed consent: Discussed risks (infection, pain, bleeding, bruising, swelling, allergic reaction, paralysis of nearby muscles, eyelid droop, double vision, neck weakness, difficulty breathing, headache, undesirable cosmetic result, and need for additional treatment) and benefits of the procedure, as well as the alternatives.  Informed consent was obtained.  Preparation: The area was cleansed with alcohol.  Procedure Details:  Botox was injected into the dermis with a 30-gauge needle. Pressure applied to any bleeding. Ice packs offered for swelling.  Lot Number:  [id number] Expiration:  12/2025  Total Units Injected:  49.5 units  Plan: Tylenol  may be used for headache.  Allow 2 weeks before returning to clinic for additional dosing as needed. Patient will call for any problems.  Plan Restylane Refyne to the lips and marionette lines. Patient takes acyclovir  400 mg BID.   Return as scheduled 01/21/24, for Filler.  IAndrea Kerns, CMA, am acting as scribe for Rexene Rattler, MD .   Documentation: I have reviewed the  above documentation for accuracy and completeness, and I agree with the above.  Rexene Rattler, MD

## 2024-01-21 ENCOUNTER — Ambulatory Visit (INDEPENDENT_AMBULATORY_CARE_PROVIDER_SITE_OTHER): Payer: Self-pay | Admitting: Dermatology

## 2024-01-21 ENCOUNTER — Other Ambulatory Visit: Payer: Self-pay | Admitting: Oncology

## 2024-01-21 ENCOUNTER — Encounter: Payer: Self-pay | Admitting: Dermatology

## 2024-01-21 DIAGNOSIS — L988 Other specified disorders of the skin and subcutaneous tissue: Secondary | ICD-10-CM

## 2024-01-21 DIAGNOSIS — C9000 Multiple myeloma not having achieved remission: Secondary | ICD-10-CM

## 2024-01-21 NOTE — Progress Notes (Signed)
   Follow-Up Visit   Subjective  Hannah Mcdonald is a 68 y.o. female who presents for the following: filler for facial elastosis  The following portions of the chart were reviewed this encounter and updated as appropriate: medications, allergies, medical history  Review of Systems:  No other skin or systemic complaints except as noted in HPI or Assessment and Plan.  Objective  Well appearing patient in no apparent distress; mood and affect are within normal limits.  A focused examination was performed of the face. Relevant physical exam findings are noted in the Assessment and Plan or shown in photos.  Before photos               After photos                 Injection map photo     Assessment & Plan    Facial Elastosis  Prior to the procedure, the patient's past medical history, allergies and the rare but potential risks and complications were reviewed with the patient and a signed consent was obtained. Pre and post-treatment care was discussed and instructions provided.   Location: lips and marionette lines  Filler Type: Restylane refyne  Procedure: The area was prepped thoroughly with Puracyn. After introducing the needle into the desired treatment area, the syringe plunger was drawn back to ensure there was no flash of blood prior to injecting the filler in order to minimize risk of intravascular injection and vascular occlusion. After injection of the filler, the treated areas were cleansed and iced to reduce swelling. Post-treatment instructions were reviewed with the patient.       Patient tolerated the procedure well. The patient will call with any problems, questions or concerns prior to their next appointment.   Return if symptoms worsen or fail to improve.  I, Grayce Saunas, RMA, am acting as scribe for Rexene Rattler, MD .   Documentation: I have reviewed the above documentation for accuracy and completeness, and I agree with the  above.  Rexene Rattler, MD

## 2024-01-21 NOTE — Telephone Encounter (Signed)
 Today's Date: January 21, 2024 Patient Name: Hannah Mcdonald [facility] revlimid  Authorization Number: [id number]

## 2024-01-21 NOTE — Patient Instructions (Signed)

## 2024-01-22 ENCOUNTER — Other Ambulatory Visit: Payer: Self-pay | Admitting: Oncology

## 2024-01-22 DIAGNOSIS — C9000 Multiple myeloma not having achieved remission: Secondary | ICD-10-CM

## 2024-01-26 ENCOUNTER — Other Ambulatory Visit: Payer: Self-pay | Admitting: Oncology

## 2024-01-28 ENCOUNTER — Encounter: Payer: Self-pay | Admitting: Oncology

## 2024-02-11 ENCOUNTER — Encounter: Payer: Self-pay | Admitting: Oncology

## 2024-02-11 ENCOUNTER — Ambulatory Visit

## 2024-02-11 ENCOUNTER — Inpatient Hospital Stay: Admitting: Pharmacist

## 2024-02-11 ENCOUNTER — Inpatient Hospital Stay: Attending: Oncology | Admitting: Oncology

## 2024-02-11 ENCOUNTER — Inpatient Hospital Stay: Attending: Oncology

## 2024-02-11 VITALS — BP 111/55 | HR 70 | Temp 98.3°F | Resp 20 | Wt 114.7 lb

## 2024-02-11 DIAGNOSIS — C9000 Multiple myeloma not having achieved remission: Secondary | ICD-10-CM

## 2024-02-11 DIAGNOSIS — Z79899 Other long term (current) drug therapy: Secondary | ICD-10-CM | POA: Insufficient documentation

## 2024-02-11 DIAGNOSIS — C9001 Multiple myeloma in remission: Secondary | ICD-10-CM | POA: Insufficient documentation

## 2024-02-11 DIAGNOSIS — D693 Immune thrombocytopenic purpura: Secondary | ICD-10-CM | POA: Diagnosis not present

## 2024-02-11 DIAGNOSIS — Z79624 Long term (current) use of inhibitors of nucleotide synthesis: Secondary | ICD-10-CM | POA: Insufficient documentation

## 2024-02-11 DIAGNOSIS — Z7969 Long term (current) use of other immunomodulators and immunosuppressants: Secondary | ICD-10-CM | POA: Diagnosis not present

## 2024-02-11 DIAGNOSIS — Z7982 Long term (current) use of aspirin: Secondary | ICD-10-CM | POA: Diagnosis not present

## 2024-02-11 DIAGNOSIS — Z800 Family history of malignant neoplasm of digestive organs: Secondary | ICD-10-CM | POA: Diagnosis not present

## 2024-02-11 LAB — CBC WITH DIFFERENTIAL/PLATELET
Abs Immature Granulocytes: 0.02 K/uL (ref 0.00–0.07)
Basophils Absolute: 0 K/uL (ref 0.0–0.1)
Basophils Relative: 1 %
Eosinophils Absolute: 0.1 K/uL (ref 0.0–0.5)
Eosinophils Relative: 3 %
HCT: 38.1 % (ref 36.0–46.0)
Hemoglobin: 12.3 g/dL (ref 12.0–15.0)
Immature Granulocytes: 1 %
Lymphocytes Relative: 19 %
Lymphs Abs: 0.8 K/uL (ref 0.7–4.0)
MCH: 33.5 pg (ref 26.0–34.0)
MCHC: 32.3 g/dL (ref 30.0–36.0)
MCV: 103.8 fL — ABNORMAL HIGH (ref 80.0–100.0)
Monocytes Absolute: 0.2 K/uL (ref 0.1–1.0)
Monocytes Relative: 5 %
Neutro Abs: 3.1 K/uL (ref 1.7–7.7)
Neutrophils Relative %: 71 %
Platelets: 123 K/uL — ABNORMAL LOW (ref 150–400)
RBC: 3.67 MIL/uL — ABNORMAL LOW (ref 3.87–5.11)
RDW: 15.1 % (ref 11.5–15.5)
WBC: 4.3 K/uL (ref 4.0–10.5)
nRBC: 0 % (ref 0.0–0.2)

## 2024-02-11 LAB — CMP (CANCER CENTER ONLY)
ALT: 40 U/L (ref 0–44)
AST: 31 U/L (ref 15–41)
Albumin: 4.1 g/dL (ref 3.5–5.0)
Alkaline Phosphatase: 79 U/L (ref 38–126)
Anion gap: 10 (ref 5–15)
BUN: 15 mg/dL (ref 8–23)
CO2: 24 mmol/L (ref 22–32)
Calcium: 9.3 mg/dL (ref 8.9–10.3)
Chloride: 105 mmol/L (ref 98–111)
Creatinine: 1.17 mg/dL — ABNORMAL HIGH (ref 0.44–1.00)
GFR, Estimated: 51 mL/min — ABNORMAL LOW (ref >60)
Glucose, Bld: 157 mg/dL — ABNORMAL HIGH (ref 70–99)
Potassium: 3.8 mmol/L (ref 3.5–5.1)
Sodium: 139 mmol/L (ref 135–145)
Total Bilirubin: 0.3 mg/dL (ref 0.0–1.2)
Total Protein: 6.1 g/dL — ABNORMAL LOW (ref 6.5–8.1)

## 2024-02-11 NOTE — Progress Notes (Signed)
 Clinical Pharmacist Practitioner Clinic [facility]  [phone] [phone]  Patient Care Team: Tisovec, Charlie ORN, MD as PCP - General (Internal Medicine) Jacobo Evalene PARAS, MD as Consulting Physician (Oncology) Darra Clan, MD as Referring Physician (Internal Medicine)   Name of the patient: Hannah Mcdonald  [id number]  1955-06-09   Date of visit: 02/11/2024  HPI: Patient is a 68 y.o. female with lambda light chain myeloma s/p transplant. Maintenance lenalidomide  was held due to pancytopenia, but with count improvement patient restarted lenalidomide  in Nov 2024.   Reason for Consult: Oral chemotherapy follow-up for lenalidomide  therapy.   PAST MEDICAL HISTORY: Past Medical History:  Diagnosis Date   Anxiety    Diabetes mellitus without complication (HCC) 2016   GERD (gastroesophageal reflux disease)    Hyperlipidemia    Myeloma (HCC)    Myeloma (HCC)    Personal history of colonic polyps    Squamous cell carcinoma of skin 11/25/2013   Left chest. WD SCC with superficial infiltration.   Tachycardia     HEMATOLOGY/ONCOLOGY HISTORY:  Oncology History  Lambda light chain myeloma (HCC)  08/24/2020 Initial Diagnosis   Lambda light chain myeloma (HCC)   09/16/2020 Cancer Staging   Staging form: Plasma Cell Myeloma and Plasma Cell Disorders, AJCC 8th Edition - Clinical stage from 09/16/2020: RISS Stage I (Beta-2 -microglobulin (mg/L): 2.5, Albumin (g/dL): 4.9, ISS: Stage I, High-risk cytogenetics: Absent, LDH: Normal) - Signed by Jacobo Evalene PARAS, MD on 09/16/2020 Beta 2 microglobulin range (mg/L): Less than 3.5 Albumin range (g/dL): Greater than or equal to 3.5 Cytogenetics: 1p deletion   11/02/2020 - 07/22/2021 Chemotherapy   Patient is on Treatment Plan : POST TRANSPLANT DaraVRd (Daratumumab  SQ) q21d x 2 Cycles (Consolidation)- cycle 5&6     08/02/2021 - 09/27/2021 Chemotherapy   Patient is on Treatment Plan : MYELOMA Daratumumab  SQ q28d-  Maintainance treatment w/ revlimid      08/02/2021 - 08/15/2022 Chemotherapy   Patient is on Treatment Plan : MYELOMA POST-TRANSPLANT MAINTENANE Daratumumab  SQ + Lenalidomide  q28d (Maintenance)       ALLERGIES:  is allergic to lenalidomide .  MEDICATIONS:  Current Outpatient Medications  Medication Sig Dispense Refill   acetaminophen  (TYLENOL ) 325 MG tablet Take 650 mg by mouth 2 (two) times a week. Premed for velcade . Taking Tuesdays and Friday     acyclovir  (ZOVIRAX ) 400 MG tablet TAKE 1 TABLET BY MOUTH TWICE DAILY 60 tablet 5   albuterol (VENTOLIN HFA) 108 (90 Base) MCG/ACT inhaler Inhale 2 puffs into the lungs every 4 (four) hours as needed.     ALPRAZolam  (XANAX ) 0.5 MG tablet Take 1 tablet (0.5 mg total) by mouth at bedtime as needed for anxiety. TAKE 1 TABLET BY MOUTH AT BEDTIME AS NEEDED FOR ANXIETY 30 tablet 0   ascorbic acid (VITAMIN C) 500 MG tablet Take 500 mg by mouth daily.     ASPIRIN  81 PO Take 81 mg by mouth daily.     atorvastatin  (LIPITOR) 20 MG tablet Take 20 mg by mouth daily.     busPIRone  (BUSPAR ) 30 MG tablet Take 1 tablet (30 mg total) by mouth 2 (two) times daily. 60 tablet 2   Calcium  Carb-Cholecalciferol  (OYSTER SHELL CALCIUM /VITAMIN D) 500-5 MG-MCG PACK Take by mouth.     CALCIUM -VITAMIN D PO Take by mouth 2 (two) times daily.     cyclobenzaprine  (FLEXERIL ) 10 MG tablet TAKE 1 TABLET BY MOUTH AT BEDTIME 30 tablet 2   dexamethasone  (DECADRON ) 4 MG tablet Take 4 mg by mouth daily.  5 tabs po q28 days     diphenhydrAMINE  (BENADRYL ) 25 mg capsule Take 25 mg by mouth daily.     diphenoxylate -atropine  (LOMOTIL ) 2.5-0.025 MG tablet Take 1-2 tablets by mouth every 8 (eight) hours as needed for diarrhea or loose stools. 90 tablet 0   DULoxetine (CYMBALTA) 20 MG capsule Take 20 mg by mouth.     ferrous sulfate 325 (65 FE) MG tablet Take 325 mg by mouth daily with breakfast.     gabapentin  (NEURONTIN ) 300 MG capsule TAKE 1 CAPSULE BY MOUTH TWICE DAILY 60 capsule 2    gabapentin  (NEURONTIN ) 400 MG capsule Take 400 mg by mouth. 400 mg in morning 400 mg at noon 1200 mg at bedtime     ivabradine (CORLANOR) 5 MG TABS tablet Take 5 mg by mouth 2 (two) times daily with a meal.     lenalidomide  (REVLIMID ) 5 MG capsule Take 1 capsule (5 mg total) by mouth daily. 21 capsule 0   lidocaine -prilocaine  (EMLA ) cream APPLY 1 APPLICATION TOPICALLY AS NEEDED 30 g 2   Magnesium  400 MG TABS Take 1 tablet by mouth daily.     meloxicam  (MOBIC ) 15 MG tablet Take 15 mg by mouth daily.     methocarbamol  (ROBAXIN ) 500 MG tablet Take 1 tablet (500 mg total) by mouth every 6 (six) hours as needed for muscle spasms. 40 tablet 1   MOUNJARO 7.5 MG/0.5ML Pen Inject 7.5 mg into the skin once a week.     Multiple Vitamin (MULTI-VITAMIN) tablet Take 1 tablet by mouth daily.     omeprazole (PRILOSEC) 40 MG capsule Take 40 mg by mouth daily.     ondansetron  (ZOFRAN ) 8 MG tablet Take 1 tablet (8 mg total) by mouth 2 (two) times daily as needed (Nausea or vomiting). 60 tablet 2   oxyCODONE -acetaminophen  (PERCOCET/ROXICET) 5-325 MG tablet TAKE 1-2 TABLETS BY MOUTH EVERY 4 HOURS AS NEEDED FOR SEVERE PAIN 90 tablet 0   predniSONE  (DELTASONE ) 10 MG tablet Take 1 tablet (10 mg total) by mouth daily. Take for 3 days along with start of every 28 day Revlimid  cycle. Take with food. 9 tablet 3   prochlorperazine  (COMPAZINE ) 10 MG tablet TAKE 1 TABLET BY MOUTH EVERY 6 HOURS AS NEEDED FOR NAUSEA OR VOMITING 60 tablet 2   traMADol  (ULTRAM ) 50 MG tablet TAKE 1 TABLET BY MOUTH EVERY 6 HOURS AS NEEDED FOR PAIN 60 tablet 2   traZODone  (DESYREL ) 50 MG tablet TAKE 1 TABLET BY MOUTH AT BEDTIME 30 tablet 1   No current facility-administered medications for this visit.   Facility-Administered Medications Ordered in Other Visits  Medication Dose Route Frequency Provider Last Rate Last Admin   0.9 %  sodium chloride  infusion   Intravenous Continuous Finnegan, Timothy J, MD   Stopped at 04/07/21 1142   heparin  lock  flush 100 unit/mL  500 Units Intravenous Once Finnegan, Timothy J, MD       sodium chloride  flush (NS) 0.9 % injection 10 mL  10 mL Intravenous PRN Finnegan, Timothy J, MD   10 mL at 11/23/20 0932   sodium chloride  flush (NS) 0.9 % injection 10 mL  10 mL Intravenous PRN Finnegan, Timothy J, MD   10 mL at 04/18/22 1417   sodium chloride  flush (NS) 0.9 % injection 10 mL  10 mL Intravenous Once Finnegan, Timothy J, MD        VITAL SIGNS: There were no vitals taken for this visit. There were no vitals filed for this visit.  Estimated body mass index is 20.98 kg/m as calculated from the following:   Height as of 07/26/23: 5' 2 (1.575 m).   Weight as of an earlier encounter on 02/11/24: 52 kg (114 lb 11.2 oz).  LABS: CBC:    Component Value Date/Time   WBC 4.3 02/11/2024 1319   HGB 12.3 02/11/2024 1319   HGB 11.4 (L) 07/17/2023 0909   HCT 38.1 02/11/2024 1319   PLT 123 (L) 02/11/2024 1319   PLT 78 (L) 07/17/2023 0909   MCV 103.8 (H) 02/11/2024 1319   NEUTROABS 3.1 02/11/2024 1319   LYMPHSABS 0.8 02/11/2024 1319   MONOABS 0.2 02/11/2024 1319   EOSABS 0.1 02/11/2024 1319   BASOSABS 0.0 02/11/2024 1319   Comprehensive Metabolic Panel:    Component Value Date/Time   NA 139 02/11/2024 1319   K 3.8 02/11/2024 1319   CL 105 02/11/2024 1319   CO2 24 02/11/2024 1319   BUN 15 02/11/2024 1319   CREATININE 1.17 (H) 02/11/2024 1319   GLUCOSE 157 (H) 02/11/2024 1319   CALCIUM  9.3 02/11/2024 1319   AST 31 02/11/2024 1319   ALT 40 02/11/2024 1319   ALKPHOS 79 02/11/2024 1319   BILITOT 0.3 02/11/2024 1319   PROT 6.1 (L) 02/11/2024 1319   ALBUMIN 4.1 02/11/2024 1319     Present during today's visit: patient only  Assessment and Plan: CMP/CBC reviewed, patient to continue lenalidomide  5mg  21 on/7 off She is about 1 week into her cycle Overall patient continues to tolerate therapy well Zometa  on hold indefinitely due to dead bone found in jaw.  She is actively seeing oral surgery.   Once issue is resolved patient will need dental clearance prior to Zometa  being able to be resumed.   Oral Chemotherapy Side Effect/Intolerance:  Rash: Continues to be managed with diphenhydramine  and prednisone  at the beginning of her cycle No other side effects noted   Oral Chemotherapy Adherence: No missed doses reported No patient barriers to medication adherence identified.   New medications: no new meds  Medication Access Issues: No issues, patient fills medication at [facility]  Patient expressed understanding and was in agreement with this plan. She also understands that She can call clinic at any time with any questions, concerns, or complaints.   Follow-up plan: RTC in 3 months  Thank you for allowing me to participate in the care of this very pleasant patient.   Time Total: 15 mins  Visit consisted of counseling and education on dealing with issues of symptom management in the setting of serious and potentially life-threatening illness.Greater than 50%  of this time was spent counseling and coordinating care related to the above assessment and plan.  Signed by: Robbyn Hodkinson N. Kaylei Frink, PharmD, BCPS, NEILA, CPP Hematology/Oncology Clinical Pharmacist Practitioner [facility] [phone]  02/11/2024 2:55 PM

## 2024-02-11 NOTE — Progress Notes (Signed)
 Patient states she would like to know what's the next steps are since she has stopped doing the zometa  due to her jaw.

## 2024-02-11 NOTE — Progress Notes (Signed)
 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  ITP.  INTERVAL HISTORY: Patient returns to clinic today for repeat laboratory work, further evaluation, and continuation of dose reduced Revlimid .  Zometa  has been discontinued secondary to dental issues.  She has continued problems with her left wrist and thumb requiring injections.  She otherwise feels well.  She continues to tolerate Revlimid  without significant side effects.  Her peripheral neuropathy is well-controlled with her current dose of gabapentin .  She has no other neurologic complaints.  She denies any recent fevers or illnesses.  She denies any chest pain, shortness of breath, cough, or hemoptysis.  She denies any nausea, vomiting, constipation, or diarrhea.  She has no urinary complaints.  Patient offers no further specific complaints today.  REVIEW OF SYSTEMS:   Review of Systems  Constitutional: Negative.  Negative for fever, malaise/fatigue and weight loss.  HENT: Negative.  Negative for congestion and sinus pain.   Respiratory: Negative.  Negative for cough, hemoptysis and shortness of breath.   Cardiovascular:  Negative for chest pain and leg swelling.  Gastrointestinal: Negative.  Negative for abdominal pain, diarrhea and nausea.  Genitourinary: Negative.  Negative for dysuria.  Musculoskeletal:  Positive for joint pain. Negative for back pain and neck pain.  Skin: Negative.  Negative for rash.  Neurological:  Positive for sensory change. Negative for dizziness, tremors, focal weakness, weakness and headaches.  Endo/Heme/Allergies: Negative.  Does not  bruise/bleed easily.  Psychiatric/Behavioral: Negative.  The patient is not nervous/anxious and does not have insomnia.     As per HPI. Otherwise, a complete review of systems is negative.  PAST MEDICAL HISTORY: Past Medical History:  Diagnosis Date   Anxiety    Diabetes mellitus without complication (HCC) 2016   GERD (gastroesophageal reflux disease)    Hyperlipidemia    Myeloma (HCC)    Myeloma (HCC)    Personal history of colonic polyps    Squamous cell carcinoma of skin 11/25/2013   Left chest. WD SCC with superficial infiltration.   Tachycardia     PAST SURGICAL HISTORY: Past Surgical History:  Procedure Laterality Date   AUGMENTATION MAMMAPLASTY Bilateral 1983   silicone and replacement in 2001   BREAST ENHANCEMENT SURGERY  1983   CESAREAN SECTION  1987, 1989   COLONOSCOPY  1988, 1998,2003, 2008, 2013   COLONOSCOPY WITH PROPOFOL  N/A 08/16/2016   Procedure: COLONOSCOPY WITH PROPOFOL ;  Surgeon: Dessa Reyes ORN, MD;  Location: [facility] END[facility];  Service: Endoscopy;  Laterality: N/A;   COLONOSCOPY WITH PROPOFOL  N/A 10/05/2021   Procedure: COLONOSCOPY WITH PROPOFOL ;  Surgeon: Dessa Reyes ORN, MD;  Location: [facility] ENDOSCOPY;  Service: Endoscopy;  Laterality: N/A;  HAS A PORT, NEEDS AMPICILLIN  PER OFFICE   DILATION AND CURETTAGE OF UTERUS     ENDOMETRIAL ABLATION  12/1991   ganglion cyst removal      IR BONE MARROW BIOPSY & ASPIRATION  08/08/2022   IR IMAGING GUIDED PORT INSERTION  10/27/2020   IR IMAGING GUIDED PORT INSERTION  04/04/2021   KYPHOPLASTY N/A 10/28/2020   Procedure: T12 and L3 KYPHOPLASTY;  Surgeon: Kathlynn Sharper, MD;  Location: [facility] ORS;  Service: Orthopedics;  Laterality: N/A;   TONSILLECTOMY  WRIST SURGERY Right 05/1995    FAMILY HISTORY: Family History  Problem Relation Age of Onset   Colon polyps Sister    Colon cancer Father 77   Diabetes Mother    Breast cancer Neg Hx     ADVANCED DIRECTIVES (Y/N):  N  HEALTH MAINTENANCE: Social History    Tobacco Use   Smoking status: Former    Current packs/day: 0.00    Average packs/day: 1 pack/day for 4.0 years (4.0 ttl pk-yrs)    Types: Cigarettes    Start date: 49    Quit date: 1982    Years since quitting: 43.9   Smokeless tobacco: Never  Vaping Use   Vaping status: Never Used  Substance Use Topics   Alcohol use: Yes    Alcohol/week: 1.0 - 2.0 standard drink of alcohol    Types: 1 - 2 Glasses of wine per week    Comment: occassionally   Drug use: No     Colonoscopy:  PAP:  Bone density:  Lipid panel:  Allergies  Allergen Reactions   Lenalidomide  Rash    Current Outpatient Medications  Medication Sig Dispense Refill   acetaminophen  (TYLENOL ) 325 MG tablet Take 650 mg by mouth 2 (two) times a week. Premed for velcade . Taking Tuesdays and Friday     acyclovir  (ZOVIRAX ) 400 MG tablet TAKE 1 TABLET BY MOUTH TWICE DAILY 60 tablet 5   albuterol (VENTOLIN HFA) 108 (90 Base) MCG/ACT inhaler Inhale 2 puffs into the lungs every 4 (four) hours as needed.     ascorbic acid (VITAMIN C) 500 MG tablet Take 500 mg by mouth daily.     ASPIRIN  81 PO Take 81 mg by mouth daily.     atorvastatin  (LIPITOR) 20 MG tablet Take 20 mg by mouth daily.     busPIRone  (BUSPAR ) 30 MG tablet Take 1 tablet (30 mg total) by mouth 2 (two) times daily. 60 tablet 2   Calcium  Carb-Cholecalciferol  (OYSTER SHELL CALCIUM /VITAMIN D) 500-5 MG-MCG PACK Take by mouth.     CALCIUM -VITAMIN D PO Take by mouth 2 (two) times daily.     cyclobenzaprine  (FLEXERIL ) 10 MG tablet TAKE 1 TABLET BY MOUTH AT BEDTIME 30 tablet 2   dexamethasone  (DECADRON ) 4 MG tablet Take 4 mg by mouth daily. 5 tabs po q28 days     diphenhydrAMINE  (BENADRYL ) 25 mg capsule Take 25 mg by mouth daily.     diphenoxylate -atropine  (LOMOTIL ) 2.5-0.025 MG tablet Take 1-2 tablets by mouth every 8 (eight) hours as needed for diarrhea or loose stools. 90 tablet 0   DULoxetine (CYMBALTA) 20 MG capsule Take 20 mg by mouth.     ferrous sulfate 325 (65  FE) MG tablet Take 325 mg by mouth daily with breakfast.     gabapentin  (NEURONTIN ) 400 MG capsule Take 400 mg by mouth. 400 mg in morning 400 mg at noon 1200 mg at bedtime     ivabradine (CORLANOR) 5 MG TABS tablet Take 5 mg by mouth 2 (two) times daily with a meal.     lenalidomide  (REVLIMID ) 5 MG capsule Take 1 capsule (5 mg total) by mouth daily. 21 capsule 0   lidocaine -prilocaine  (EMLA ) cream APPLY 1 APPLICATION TOPICALLY AS NEEDED 30 g 2   Magnesium  400 MG TABS Take 1 tablet by mouth daily.     meloxicam  (MOBIC ) 15 MG tablet Take 15 mg by mouth daily.     MOUNJARO 7.5 MG/0.5ML Pen Inject 7.5 mg into the skin once a week.  Multiple Vitamin (MULTI-VITAMIN) tablet Take 1 tablet by mouth daily.     omeprazole (PRILOSEC) 40 MG capsule Take 40 mg by mouth daily.     ondansetron  (ZOFRAN ) 8 MG tablet Take 1 tablet (8 mg total) by mouth 2 (two) times daily as needed (Nausea or vomiting). 60 tablet 2   oxyCODONE -acetaminophen  (PERCOCET/ROXICET) 5-325 MG tablet TAKE 1-2 TABLETS BY MOUTH EVERY 4 HOURS AS NEEDED FOR SEVERE PAIN 90 tablet 0   predniSONE  (DELTASONE ) 10 MG tablet Take 1 tablet (10 mg total) by mouth daily. Take for 3 days along with start of every 28 day Revlimid  cycle. Take with food. 9 tablet 3   prochlorperazine  (COMPAZINE ) 10 MG tablet TAKE 1 TABLET BY MOUTH EVERY 6 HOURS AS NEEDED FOR NAUSEA OR VOMITING 60 tablet 2   traMADol  (ULTRAM ) 50 MG tablet TAKE 1 TABLET BY MOUTH EVERY 6 HOURS AS NEEDED FOR PAIN 60 tablet 2   traZODone  (DESYREL ) 50 MG tablet TAKE 1 TABLET BY MOUTH AT BEDTIME 30 tablet 1   ALPRAZolam  (XANAX ) 0.5 MG tablet Take 1 tablet (0.5 mg total) by mouth at bedtime as needed for anxiety. TAKE 1 TABLET BY MOUTH AT BEDTIME AS NEEDED FOR ANXIETY 30 tablet 0   gabapentin  (NEURONTIN ) 300 MG capsule TAKE 1 CAPSULE BY MOUTH TWICE DAILY 60 capsule 2   methocarbamol  (ROBAXIN ) 500 MG tablet Take 1 tablet (500 mg total) by mouth every 6 (six) hours as needed for muscle spasms. 40  tablet 1   No current facility-administered medications for this visit.   Facility-Administered Medications Ordered in Other Visits  Medication Dose Route Frequency Provider Last Rate Last Admin   0.9 %  sodium chloride  infusion   Intravenous Continuous Ridley Dileo J, MD   Stopped at 04/07/21 1142   heparin  lock flush 100 unit/mL  500 Units Intravenous Once Maddax Palinkas J, MD       sodium chloride  flush (NS) 0.9 % injection 10 mL  10 mL Intravenous PRN Maniyah Moller J, MD   10 mL at 11/23/20 0932   sodium chloride  flush (NS) 0.9 % injection 10 mL  10 mL Intravenous PRN Alontae Chaloux J, MD   10 mL at 04/18/22 1417   sodium chloride  flush (NS) 0.9 % injection 10 mL  10 mL Intravenous Once Loreta Blouch J, MD        OBJECTIVE: Vitals:   02/11/24 1334  BP: (!) 111/55  Pulse: 70  Resp: 20  Temp: 98.3 F (36.8 C)  SpO2: 100%      Body mass index is 20.98 kg/m.    ECOG FS:1 - Symptomatic but completely ambulatory  General: Well-developed, well-nourished, no acute distress. Eyes: Pink conjunctiva, anicteric sclera. HEENT: Normocephalic, moist mucous membranes. Lungs: No audible wheezing or coughing. Heart: Regular rate and rhythm. Abdomen: Soft, nontender, no obvious distention. Musculoskeletal: No edema, cyanosis, or clubbing. Neuro: Alert, answering all questions appropriately. Cranial nerves grossly intact. Skin: No rashes or petechiae noted. Psych: Normal affect.  LAB RESULTS:  Lab Results  Component Value Date   NA 139 02/11/2024   K 3.8 02/11/2024   CL 105 02/11/2024   CO2 24 02/11/2024   GLUCOSE 157 (H) 02/11/2024   BUN 15 02/11/2024   CREATININE 1.17 (H) 02/11/2024   CALCIUM  9.3 02/11/2024   PROT 6.1 (L) 02/11/2024   ALBUMIN 4.1 02/11/2024   AST 31 02/11/2024   ALT 40 02/11/2024   ALKPHOS 79 02/11/2024   BILITOT 0.3 02/11/2024   GFRNONAA 51 (L) 02/11/2024  Lab Results  Component Value Date   WBC 4.3 02/11/2024   NEUTROABS 3.1  02/11/2024   HGB 12.3 02/11/2024   HCT 38.1 02/11/2024   MCV 103.8 (H) 02/11/2024   PLT 123 (L) 02/11/2024     STUDIES: No results found.   ONCOLOGY HISTORY:  Diagnosis confirmed from bone biopsy on August 17, 2020.  Initially her lambda free light chains were elevated at 432.1, but now are within normal limits at 12.1.  Immunoglobulins and SPEP are normal.  Bone marrow biopsy completed on August 26, 2020 revealed 25% plasma cells along with the deletion of 1p which is considered poor prognosis.  PET scan results from September 06, 2020 reviewed independently with 3 hypermetabolic lesions in right C5-6 facets, right iliac crest lesion, and L3 vertebral lesion.  After lengthy discussion with the patient, she agreed to pursue chemotherapy with daratumumab , Velcade , Revlimid , and dexamethasone  followed by autologous bone marrow transplant.  Patient received weekly daratumumab  for 4 cycles along with Velcade  on days 1, 4, 8, and 11.  She also received Revlimid  on days 1 through 14 of a 21-day cycle.  She completed cycle 4 of treatment on January 14, 2021.  Subsequent bone marrow biopsy on January 24, 2021 revealed complete remission with no evidence of myeloma.  Patient underwent autologous stem cell transplant at [facility] on February 24, 2021.  She was readmitted to the hospital on March 04, 2021 with neutropenic fever, diarrhea, and sepsis-like symptoms.  Infectious disease work-up was negative.   Patient previously received consolidation treatment with the GRIFFIN regimen.  She received consolidation with cycle 5 and 6 using daratumumab  on day 1, Velcade  on day 1, 4, 8, and 11, Revlimid  10 mg on days 1 through 14 with 7 days off, and weekly dexamethasone  on a 21-day cycle.  She was on maintenance daratumumab  on day 1 and Revlimid  10 mg on days 1 through 21 on a 28-day cycle for cycles 7 through 32.  Her most recent PET scan on July 31, 2022 reviewed independently and report as above with no obvious  evidence of recurrent disease.  A second bone marrow biopsy was completed on August 08, 2022 which also revealed no evidence of disease with normal cytogenetics.    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  PLAN:      Lambda light chain myeloma with 1p del:  Given her persistent pancytopenia, maintenance Revlimid  was discontinued in approximately July 2024 and then reinitiated in November 2024.  Her most recent bone marrow biopsy at [facility] on November 07, 2022 reported complete remission.  PET scan results from Jul 17, 2023 reviewed independently and revealed no evidence of recurrent or progressive disease.  Continue dose reduced to 5 mg Revlimid  daily for 21 days with 7 days off.  Will not increase dose of Revlimid  at this time.  Zometa  has been discontinued secondary to dental issues.  No further intervention is needed.  Return to clinic in 6 weeks for laboratory work only and then in 3 months for laboratory work and further evaluation.  Appreciate clinical pharmacy input.     Thrombocytopenia: Platelets mildly improved to 123.  Continue low-dose Revlimid  as above.  Anemia: Resolved.   Leukopenia: Resolved.  Proceed with Revlimid  as above. Hypocalcemia: Resolved. Renal insufficiency: Chronic and unchanged.  Patient's GFR is 51.   Vaccinations: Continue revaccination schedule as per [facility].  Patient was last seen on August 22, 2023.   Pulmonary nodule: Resolved.  Left knee pain: Patient does not complain of this today.  Continue conservative management and injections as needed per orthopedics. Hand pain: Continue conservative management and injections as needed per orthopedics. Peripheral neuropathy: Chronic and unchanged.  Continue current dose of gabapentin .  Continue follow-up with neurology as scheduled.   Dental issues: Patient noted to have necrotic jawbone.  Unclear if related to Zometa  but this has been discontinued.  Will not restart  treatment unless given full clearance by surgeon.  Patient expressed understanding and was in agreement with this plan. She also understands that She can call clinic at any time with any questions, concerns, or complaints.      Cancer Staging  Lambda light chain myeloma (HCC) Staging form: Plasma Cell Myeloma and Plasma Cell Disorders, AJCC 8th Edition - Clinical stage from 09/16/2020: RISS Stage I (Beta-2 -microglobulin (mg/L): 2.5, Albumin (g/dL): 4.9, ISS: Stage I, High-risk cytogenetics: Absent, LDH: Normal) - Signed by Jacobo Evalene PARAS, MD on 09/16/2020 Beta 2 microglobulin range (mg/L): Less than 3.5 Albumin range (g/dL): Greater than or equal to 3.5 Cytogenetics: 1p deletion  Evalene PARAS Jacobo, MD   02/11/2024 3:55 PM

## 2024-02-12 LAB — KAPPA/LAMBDA LIGHT CHAINS
Kappa free light chain: 14.8 mg/L (ref 3.3–19.4)
Kappa, lambda light chain ratio: 1.12 (ref 0.26–1.65)
Lambda free light chains: 13.2 mg/L (ref 5.7–26.3)

## 2024-02-18 ENCOUNTER — Other Ambulatory Visit: Payer: Self-pay | Admitting: Oncology

## 2024-02-18 DIAGNOSIS — C9000 Multiple myeloma not having achieved remission: Secondary | ICD-10-CM

## 2024-03-08 ENCOUNTER — Other Ambulatory Visit: Payer: Self-pay | Admitting: Oncology

## 2024-03-10 ENCOUNTER — Encounter: Payer: Self-pay | Admitting: Oncology

## 2024-03-18 ENCOUNTER — Other Ambulatory Visit: Payer: Self-pay | Admitting: Oncology

## 2024-03-18 ENCOUNTER — Other Ambulatory Visit: Payer: Self-pay | Admitting: *Deleted

## 2024-03-18 DIAGNOSIS — C9000 Multiple myeloma not having achieved remission: Secondary | ICD-10-CM

## 2024-03-18 MED ORDER — LENALIDOMIDE 5 MG PO CAPS: 5.0000 mg | ORAL_CAPSULE | Freq: Every day | ORAL | 0 refills | Status: DC

## 2024-03-19 ENCOUNTER — Encounter: Payer: Self-pay | Admitting: Oral Surgery

## 2024-03-19 ENCOUNTER — Other Ambulatory Visit: Payer: Self-pay | Admitting: Oral Surgery

## 2024-03-19 ENCOUNTER — Other Ambulatory Visit: Payer: Self-pay | Admitting: *Deleted

## 2024-03-19 DIAGNOSIS — M272 Inflammatory conditions of jaws: Secondary | ICD-10-CM

## 2024-03-19 DIAGNOSIS — C9000 Multiple myeloma not having achieved remission: Secondary | ICD-10-CM

## 2024-03-19 MED ORDER — LENALIDOMIDE 5 MG PO CAPS: 5.0000 mg | ORAL_CAPSULE | Freq: Every day | ORAL | 0 refills | Status: AC

## 2024-03-21 ENCOUNTER — Ambulatory Visit
Admission: RE | Admit: 2024-03-21 | Discharge: 2024-03-21 | Disposition: A | Source: Ambulatory Visit | Attending: Oral Surgery | Admitting: Oral Surgery

## 2024-03-21 DIAGNOSIS — M272 Inflammatory conditions of jaws: Secondary | ICD-10-CM

## 2024-03-24 ENCOUNTER — Inpatient Hospital Stay

## 2024-03-28 ENCOUNTER — Inpatient Hospital Stay: Attending: Oncology

## 2024-03-28 DIAGNOSIS — C9000 Multiple myeloma not having achieved remission: Secondary | ICD-10-CM

## 2024-03-28 LAB — CMP (CANCER CENTER ONLY)
ALT: 27 U/L (ref 0–44)
AST: 30 U/L (ref 15–41)
Albumin: 4.1 g/dL (ref 3.5–5.0)
Alkaline Phosphatase: 72 U/L (ref 38–126)
Anion gap: 11 (ref 5–15)
BUN: 19 mg/dL (ref 8–23)
CO2: 24 mmol/L (ref 22–32)
Calcium: 9.4 mg/dL (ref 8.9–10.3)
Chloride: 105 mmol/L (ref 98–111)
Creatinine: 1.37 mg/dL — ABNORMAL HIGH (ref 0.44–1.00)
GFR, Estimated: 42 mL/min — ABNORMAL LOW
Glucose, Bld: 167 mg/dL — ABNORMAL HIGH (ref 70–99)
Potassium: 4 mmol/L (ref 3.5–5.1)
Sodium: 140 mmol/L (ref 135–145)
Total Bilirubin: 0.4 mg/dL (ref 0.0–1.2)
Total Protein: 6.1 g/dL — ABNORMAL LOW (ref 6.5–8.1)

## 2024-03-28 LAB — CBC WITH DIFFERENTIAL/PLATELET
Abs Immature Granulocytes: 0 10*3/uL (ref 0.00–0.07)
Basophils Absolute: 0 10*3/uL (ref 0.0–0.1)
Basophils Relative: 1 %
Eosinophils Absolute: 0.1 10*3/uL (ref 0.0–0.5)
Eosinophils Relative: 3 %
HCT: 38.8 % (ref 36.0–46.0)
Hemoglobin: 12.5 g/dL (ref 12.0–15.0)
Immature Granulocytes: 0 %
Lymphocytes Relative: 24 %
Lymphs Abs: 0.7 10*3/uL (ref 0.7–4.0)
MCH: 33 pg (ref 26.0–34.0)
MCHC: 32.2 g/dL (ref 30.0–36.0)
MCV: 102.4 fL — ABNORMAL HIGH (ref 80.0–100.0)
Monocytes Absolute: 0.4 10*3/uL (ref 0.1–1.0)
Monocytes Relative: 14 %
Neutro Abs: 1.6 10*3/uL — ABNORMAL LOW (ref 1.7–7.7)
Neutrophils Relative %: 58 %
Platelets: 106 10*3/uL — ABNORMAL LOW (ref 150–400)
RBC: 3.79 MIL/uL — ABNORMAL LOW (ref 3.87–5.11)
RDW: 14.6 % (ref 11.5–15.5)
WBC: 2.8 10*3/uL — ABNORMAL LOW (ref 4.0–10.5)
nRBC: 0 % (ref 0.0–0.2)

## 2024-03-31 LAB — KAPPA/LAMBDA LIGHT CHAINS
Kappa free light chain: 16.1 mg/L (ref 3.3–19.4)
Kappa, lambda light chain ratio: 1.12 (ref 0.26–1.65)
Lambda free light chains: 14.4 mg/L (ref 5.7–26.3)

## 2024-05-13 ENCOUNTER — Inpatient Hospital Stay: Admitting: Pharmacist

## 2024-05-13 ENCOUNTER — Inpatient Hospital Stay: Admitting: Oncology

## 2024-05-13 ENCOUNTER — Inpatient Hospital Stay

## 2024-05-19 ENCOUNTER — Ambulatory Visit: Admitting: Dermatology

## 2024-07-14 ENCOUNTER — Ambulatory Visit: Admitting: Dermatology

## 2024-12-02 ENCOUNTER — Encounter: Admitting: Dermatology
